# Patient Record
Sex: Male | Born: 1960 | Race: White | Hispanic: No | Marital: Married | Smoking: Former smoker
Health system: Southern US, Community
[De-identification: ages and names within clinical notes are randomized; demographics above are authoritative.]

## PROBLEM LIST (undated history)

## (undated) DIAGNOSIS — Z720 Tobacco use: Secondary | ICD-10-CM

## (undated) DIAGNOSIS — I509 Heart failure, unspecified: Secondary | ICD-10-CM

## (undated) DIAGNOSIS — F1290 Cannabis use, unspecified, uncomplicated: Secondary | ICD-10-CM

## (undated) DIAGNOSIS — I10 Essential (primary) hypertension: Secondary | ICD-10-CM

## (undated) DIAGNOSIS — F1090 Alcohol use, unspecified, uncomplicated: Secondary | ICD-10-CM

## (undated) DIAGNOSIS — I2510 Atherosclerotic heart disease of native coronary artery without angina pectoris: Secondary | ICD-10-CM

## (undated) DIAGNOSIS — K219 Gastro-esophageal reflux disease without esophagitis: Secondary | ICD-10-CM

## (undated) DIAGNOSIS — M1990 Unspecified osteoarthritis, unspecified site: Secondary | ICD-10-CM

## (undated) DIAGNOSIS — Z7289 Other problems related to lifestyle: Secondary | ICD-10-CM

## (undated) DIAGNOSIS — Z789 Other specified health status: Secondary | ICD-10-CM

## (undated) HISTORY — PX: FRACTURE SURGERY: SHX138

---

## 2020-01-29 ENCOUNTER — Other Ambulatory Visit: Payer: Self-pay

## 2020-10-20 ENCOUNTER — Inpatient Hospital Stay (HOSPITAL_COMMUNITY)
Admission: EM | Admit: 2020-10-20 | Discharge: 2020-11-04 | DRG: 870 | Discharge status: Against Medical Advice | Payer: Self-pay | Attending: Internal Medicine | Admitting: Internal Medicine

## 2020-10-20 ENCOUNTER — Other Ambulatory Visit: Payer: Self-pay

## 2020-10-20 DIAGNOSIS — I2510 Atherosclerotic heart disease of native coronary artery without angina pectoris: Secondary | ICD-10-CM | POA: Diagnosis present

## 2020-10-20 DIAGNOSIS — F10139 Alcohol abuse with withdrawal, unspecified: Secondary | ICD-10-CM | POA: Diagnosis present

## 2020-10-20 DIAGNOSIS — F419 Anxiety disorder, unspecified: Secondary | ICD-10-CM | POA: Diagnosis present

## 2020-10-20 DIAGNOSIS — F1010 Alcohol abuse, uncomplicated: Secondary | ICD-10-CM

## 2020-10-20 DIAGNOSIS — Z789 Other specified health status: Secondary | ICD-10-CM

## 2020-10-20 DIAGNOSIS — F1910 Other psychoactive substance abuse, uncomplicated: Secondary | ICD-10-CM

## 2020-10-20 DIAGNOSIS — F1310 Sedative, hypnotic or anxiolytic abuse, uncomplicated: Secondary | ICD-10-CM | POA: Diagnosis present

## 2020-10-20 DIAGNOSIS — I509 Heart failure, unspecified: Secondary | ICD-10-CM

## 2020-10-20 DIAGNOSIS — L89316 Pressure-induced deep tissue damage of right buttock: Secondary | ICD-10-CM | POA: Diagnosis present

## 2020-10-20 DIAGNOSIS — Z20822 Contact with and (suspected) exposure to covid-19: Secondary | ICD-10-CM | POA: Diagnosis present

## 2020-10-20 DIAGNOSIS — B9562 Methicillin resistant Staphylococcus aureus infection as the cause of diseases classified elsewhere: Secondary | ICD-10-CM

## 2020-10-20 DIAGNOSIS — A4102 Sepsis due to Methicillin resistant Staphylococcus aureus: Principal | ICD-10-CM | POA: Diagnosis present

## 2020-10-20 DIAGNOSIS — R601 Generalized edema: Secondary | ICD-10-CM

## 2020-10-20 DIAGNOSIS — Z95828 Presence of other vascular implants and grafts: Secondary | ICD-10-CM

## 2020-10-20 DIAGNOSIS — F17210 Nicotine dependence, cigarettes, uncomplicated: Secondary | ICD-10-CM | POA: Diagnosis present

## 2020-10-20 DIAGNOSIS — N179 Acute kidney failure, unspecified: Secondary | ICD-10-CM

## 2020-10-20 DIAGNOSIS — J9690 Respiratory failure, unspecified, unspecified whether with hypoxia or hypercapnia: Secondary | ICD-10-CM

## 2020-10-20 DIAGNOSIS — F17290 Nicotine dependence, other tobacco product, uncomplicated: Secondary | ICD-10-CM | POA: Diagnosis present

## 2020-10-20 DIAGNOSIS — F1210 Cannabis abuse, uncomplicated: Secondary | ICD-10-CM | POA: Diagnosis present

## 2020-10-20 DIAGNOSIS — R29705 NIHSS score 5: Secondary | ICD-10-CM | POA: Diagnosis not present

## 2020-10-20 DIAGNOSIS — D75838 Other thrombocytosis: Secondary | ICD-10-CM | POA: Diagnosis present

## 2020-10-20 DIAGNOSIS — R188 Other ascites: Secondary | ICD-10-CM | POA: Diagnosis present

## 2020-10-20 DIAGNOSIS — R7881 Bacteremia: Secondary | ICD-10-CM

## 2020-10-20 DIAGNOSIS — I255 Ischemic cardiomyopathy: Secondary | ICD-10-CM | POA: Diagnosis present

## 2020-10-20 DIAGNOSIS — E876 Hypokalemia: Secondary | ICD-10-CM | POA: Diagnosis present

## 2020-10-20 DIAGNOSIS — J9600 Acute respiratory failure, unspecified whether with hypoxia or hypercapnia: Secondary | ICD-10-CM

## 2020-10-20 DIAGNOSIS — W19XXXA Unspecified fall, initial encounter: Secondary | ICD-10-CM

## 2020-10-20 DIAGNOSIS — F10239 Alcohol dependence with withdrawal, unspecified: Secondary | ICD-10-CM | POA: Diagnosis present

## 2020-10-20 DIAGNOSIS — I493 Ventricular premature depolarization: Secondary | ICD-10-CM | POA: Diagnosis present

## 2020-10-20 DIAGNOSIS — L89326 Pressure-induced deep tissue damage of left buttock: Secondary | ICD-10-CM | POA: Diagnosis present

## 2020-10-20 DIAGNOSIS — I5023 Acute on chronic systolic (congestive) heart failure: Secondary | ICD-10-CM | POA: Diagnosis present

## 2020-10-20 DIAGNOSIS — J810 Acute pulmonary edema: Secondary | ICD-10-CM

## 2020-10-20 DIAGNOSIS — K7460 Unspecified cirrhosis of liver: Secondary | ICD-10-CM | POA: Diagnosis present

## 2020-10-20 DIAGNOSIS — Z452 Encounter for adjustment and management of vascular access device: Secondary | ICD-10-CM

## 2020-10-20 DIAGNOSIS — L89322 Pressure ulcer of left buttock, stage 2: Secondary | ICD-10-CM | POA: Diagnosis present

## 2020-10-20 DIAGNOSIS — L89159 Pressure ulcer of sacral region, unspecified stage: Secondary | ICD-10-CM | POA: Diagnosis present

## 2020-10-20 DIAGNOSIS — E875 Hyperkalemia: Secondary | ICD-10-CM

## 2020-10-20 DIAGNOSIS — N170 Acute kidney failure with tubular necrosis: Secondary | ICD-10-CM | POA: Diagnosis present

## 2020-10-20 DIAGNOSIS — Z8249 Family history of ischemic heart disease and other diseases of the circulatory system: Secondary | ICD-10-CM

## 2020-10-20 DIAGNOSIS — E46 Unspecified protein-calorie malnutrition: Secondary | ICD-10-CM

## 2020-10-20 DIAGNOSIS — R7401 Elevation of levels of liver transaminase levels: Secondary | ICD-10-CM

## 2020-10-20 DIAGNOSIS — J9601 Acute respiratory failure with hypoxia: Secondary | ICD-10-CM | POA: Diagnosis present

## 2020-10-20 DIAGNOSIS — E872 Acidosis: Secondary | ICD-10-CM | POA: Diagnosis present

## 2020-10-20 DIAGNOSIS — I110 Hypertensive heart disease with heart failure: Secondary | ICD-10-CM | POA: Diagnosis present

## 2020-10-20 DIAGNOSIS — R4701 Aphasia: Secondary | ICD-10-CM | POA: Diagnosis not present

## 2020-10-20 DIAGNOSIS — L8990 Pressure ulcer of unspecified site, unspecified stage: Secondary | ICD-10-CM | POA: Insufficient documentation

## 2020-10-20 DIAGNOSIS — E441 Mild protein-calorie malnutrition: Secondary | ICD-10-CM | POA: Diagnosis present

## 2020-10-20 DIAGNOSIS — R0602 Shortness of breath: Secondary | ICD-10-CM

## 2020-10-20 DIAGNOSIS — J15212 Pneumonia due to Methicillin resistant Staphylococcus aureus: Secondary | ICD-10-CM | POA: Diagnosis present

## 2020-10-20 DIAGNOSIS — R570 Cardiogenic shock: Secondary | ICD-10-CM

## 2020-10-20 DIAGNOSIS — Z720 Tobacco use: Secondary | ICD-10-CM

## 2020-10-20 DIAGNOSIS — Z978 Presence of other specified devices: Secondary | ICD-10-CM

## 2020-10-20 DIAGNOSIS — Z9289 Personal history of other medical treatment: Secondary | ICD-10-CM

## 2020-10-20 DIAGNOSIS — E8809 Other disorders of plasma-protein metabolism, not elsewhere classified: Secondary | ICD-10-CM

## 2020-10-20 DIAGNOSIS — Z4659 Encounter for fitting and adjustment of other gastrointestinal appliance and device: Secondary | ICD-10-CM

## 2020-10-20 DIAGNOSIS — R339 Retention of urine, unspecified: Secondary | ICD-10-CM | POA: Diagnosis not present

## 2020-10-20 DIAGNOSIS — Z79899 Other long term (current) drug therapy: Secondary | ICD-10-CM

## 2020-10-20 DIAGNOSIS — D6489 Other specified anemias: Secondary | ICD-10-CM | POA: Diagnosis present

## 2020-10-20 DIAGNOSIS — F1410 Cocaine abuse, uncomplicated: Secondary | ICD-10-CM

## 2020-10-20 DIAGNOSIS — R7989 Other specified abnormal findings of blood chemistry: Secondary | ICD-10-CM

## 2020-10-20 DIAGNOSIS — G9341 Metabolic encephalopathy: Secondary | ICD-10-CM | POA: Diagnosis present

## 2020-10-20 DIAGNOSIS — R6521 Severe sepsis with septic shock: Secondary | ICD-10-CM | POA: Diagnosis present

## 2020-10-20 DIAGNOSIS — D75839 Thrombocytosis, unspecified: Secondary | ICD-10-CM

## 2020-10-20 DIAGNOSIS — G9340 Encephalopathy, unspecified: Secondary | ICD-10-CM

## 2020-10-20 DIAGNOSIS — I472 Ventricular tachycardia: Secondary | ICD-10-CM | POA: Diagnosis present

## 2020-10-20 HISTORY — DX: Alcohol use, unspecified, uncomplicated: F10.90

## 2020-10-20 HISTORY — DX: Cannabis use, unspecified, uncomplicated: F12.90

## 2020-10-20 HISTORY — DX: Tobacco use: Z72.0

## 2020-10-20 HISTORY — DX: Other specified health status: Z78.9

## 2020-10-20 HISTORY — DX: Unspecified osteoarthritis, unspecified site: M19.90

## 2020-10-20 HISTORY — DX: Other problems related to lifestyle: Z72.89

## 2020-10-20 NOTE — ED Triage Notes (Addendum)
Pt c/o bilateral knee swelling that started since he has been more active playing football. Pt also states he feel like his abd is swollen some as well. Pt also states he is concerned that he might have a UTI and sinus infection as well.

## 2020-10-21 ENCOUNTER — Other Ambulatory Visit: Payer: Self-pay

## 2020-10-21 ENCOUNTER — Inpatient Hospital Stay (HOSPITAL_COMMUNITY): Payer: Self-pay

## 2020-10-21 ENCOUNTER — Encounter (HOSPITAL_COMMUNITY): Payer: Self-pay | Admitting: Emergency Medicine

## 2020-10-21 ENCOUNTER — Emergency Department (HOSPITAL_COMMUNITY): Payer: Self-pay

## 2020-10-21 DIAGNOSIS — I340 Nonrheumatic mitral (valve) insufficiency: Secondary | ICD-10-CM

## 2020-10-21 DIAGNOSIS — E46 Unspecified protein-calorie malnutrition: Secondary | ICD-10-CM

## 2020-10-21 DIAGNOSIS — I509 Heart failure, unspecified: Secondary | ICD-10-CM

## 2020-10-21 DIAGNOSIS — D75839 Thrombocytosis, unspecified: Secondary | ICD-10-CM

## 2020-10-21 DIAGNOSIS — E8809 Other disorders of plasma-protein metabolism, not elsewhere classified: Secondary | ICD-10-CM

## 2020-10-21 DIAGNOSIS — R7401 Elevation of levels of liver transaminase levels: Secondary | ICD-10-CM

## 2020-10-21 DIAGNOSIS — R7989 Other specified abnormal findings of blood chemistry: Secondary | ICD-10-CM

## 2020-10-21 DIAGNOSIS — Z720 Tobacco use: Secondary | ICD-10-CM

## 2020-10-21 LAB — CBC
HCT: 42.6 % (ref 39.0–52.0)
Hemoglobin: 12.7 g/dL — ABNORMAL LOW (ref 13.0–17.0)
MCH: 27.9 pg (ref 26.0–34.0)
MCHC: 29.8 g/dL — ABNORMAL LOW (ref 30.0–36.0)
MCV: 93.6 fL (ref 80.0–100.0)
Platelets: 435 10*3/uL — ABNORMAL HIGH (ref 150–400)
RBC: 4.55 MIL/uL (ref 4.22–5.81)
RDW: 16.9 % — ABNORMAL HIGH (ref 11.5–15.5)
WBC: 6.4 10*3/uL (ref 4.0–10.5)
nRBC: 0 % (ref 0.0–0.2)

## 2020-10-21 LAB — CBC WITH DIFFERENTIAL/PLATELET
Abs Immature Granulocytes: 0.01 10*3/uL (ref 0.00–0.07)
Basophils Absolute: 0 10*3/uL (ref 0.0–0.1)
Basophils Relative: 0 %
Eosinophils Absolute: 0.1 10*3/uL (ref 0.0–0.5)
Eosinophils Relative: 2 %
HCT: 42 % (ref 39.0–52.0)
Hemoglobin: 12.4 g/dL — ABNORMAL LOW (ref 13.0–17.0)
Immature Granulocytes: 0 %
Lymphocytes Relative: 17 %
Lymphs Abs: 1 10*3/uL (ref 0.7–4.0)
MCH: 27.5 pg (ref 26.0–34.0)
MCHC: 29.5 g/dL — ABNORMAL LOW (ref 30.0–36.0)
MCV: 93.1 fL (ref 80.0–100.0)
Monocytes Absolute: 0.5 10*3/uL (ref 0.1–1.0)
Monocytes Relative: 9 %
Neutro Abs: 4 10*3/uL (ref 1.7–7.7)
Neutrophils Relative %: 72 %
Platelets: 431 10*3/uL — ABNORMAL HIGH (ref 150–400)
RBC: 4.51 MIL/uL (ref 4.22–5.81)
RDW: 16.9 % — ABNORMAL HIGH (ref 11.5–15.5)
WBC: 5.6 10*3/uL (ref 4.0–10.5)
nRBC: 0 % (ref 0.0–0.2)

## 2020-10-21 LAB — COMPREHENSIVE METABOLIC PANEL
ALT: 173 U/L — ABNORMAL HIGH (ref 0–44)
ALT: 167 U/L — ABNORMAL HIGH (ref 0–44)
AST: 91 U/L — ABNORMAL HIGH (ref 15–41)
AST: 86 U/L — ABNORMAL HIGH (ref 15–41)
Albumin: 3.3 g/dL — ABNORMAL LOW (ref 3.5–5.0)
Albumin: 3.4 g/dL — ABNORMAL LOW (ref 3.5–5.0)
Alkaline Phosphatase: 81 U/L (ref 38–126)
Alkaline Phosphatase: 83 U/L (ref 38–126)
Anion gap: 8 (ref 5–15)
Anion gap: 8 (ref 5–15)
BUN: 20 mg/dL (ref 6–20)
BUN: 20 mg/dL (ref 6–20)
CO2: 26 mmol/L (ref 22–32)
CO2: 27 mmol/L (ref 22–32)
Calcium: 8.2 mg/dL — ABNORMAL LOW (ref 8.9–10.3)
Calcium: 8.5 mg/dL — ABNORMAL LOW (ref 8.9–10.3)
Chloride: 103 mmol/L (ref 98–111)
Chloride: 103 mmol/L (ref 98–111)
Creatinine, Ser: 1.09 mg/dL (ref 0.61–1.24)
Creatinine, Ser: 1.05 mg/dL (ref 0.61–1.24)
GFR, Estimated: >60 mL/min (ref >60)
GFR, Estimated: >60 mL/min (ref >60)
Glucose, Bld: 126 mg/dL — ABNORMAL HIGH (ref 70–99)
Glucose, Bld: 104 mg/dL — ABNORMAL HIGH (ref 70–99)
Potassium: 3.8 mmol/L (ref 3.5–5.1)
Potassium: 4.3 mmol/L (ref 3.5–5.1)
Sodium: 137 mmol/L (ref 135–145)
Sodium: 138 mmol/L (ref 135–145)
Total Bilirubin: 1 mg/dL (ref 0.3–1.2)
Total Bilirubin: 1 mg/dL (ref 0.3–1.2)
Total Protein: 6.4 g/dL — ABNORMAL LOW (ref 6.5–8.1)
Total Protein: 6.4 g/dL — ABNORMAL LOW (ref 6.5–8.1)

## 2020-10-21 LAB — APTT: aPTT: 30 seconds (ref 24–36)

## 2020-10-21 LAB — PROTIME-INR
INR: 1.3 — ABNORMAL HIGH (ref 0.8–1.2)
Prothrombin Time: 16 seconds — ABNORMAL HIGH (ref 11.4–15.2)

## 2020-10-21 LAB — ECHOCARDIOGRAM COMPLETE
AR max vel: 2.44 cm2
AV Area VTI: 2.79 cm2
AV Area mean vel: 2.22 cm2
AV Mean grad: 1.4 mmHg
AV Peak grad: 2.8 mmHg
Ao pk vel: 0.83 m/s
Area-P 1/2: 3.15 cm2
Height: 69 in
S' Lateral: 5.57 cm
Weight: 3200 oz

## 2020-10-21 LAB — HIV ANTIBODY (ROUTINE TESTING W REFLEX): HIV Screen 4th Generation wRfx: NONREACTIVE

## 2020-10-21 LAB — HEPATITIS PANEL, ACUTE
HCV Ab: NONREACTIVE
Hep A IgM: NONREACTIVE
Hep B C IgM: NONREACTIVE
Hepatitis B Surface Ag: NONREACTIVE

## 2020-10-21 LAB — PHOSPHORUS: Phosphorus: 4.1 mg/dL (ref 2.5–4.6)

## 2020-10-21 LAB — AMMONIA: Ammonia: 12 umol/L (ref 9–35)

## 2020-10-21 LAB — SARS CORONAVIRUS 2 (TAT 6-24 HRS): SARS Coronavirus 2: NEGATIVE

## 2020-10-21 LAB — BRAIN NATRIURETIC PEPTIDE: B Natriuretic Peptide: 2252 pg/mL — ABNORMAL HIGH (ref 0.0–100.0)

## 2020-10-21 LAB — MAGNESIUM: Magnesium: 2.1 mg/dL (ref 1.7–2.4)

## 2020-10-21 MED ORDER — LORAZEPAM 1 MG PO TABS
1.0000 mg | ORAL_TABLET | ORAL | Status: AC | PRN
  Administered 2020-10-22 – 2020-10-24 (×6): 1 mg via ORAL
  Administered 2020-10-24 (×2): 2 mg via ORAL
  Filled 2020-10-21 (×5): qty 1
  Filled 2020-10-21: qty 2
  Filled 2020-10-21: qty 1
  Filled 2020-10-21: qty 2

## 2020-10-21 MED ORDER — LORAZEPAM 0.5 MG PO TABS
0.5000 mg | ORAL_TABLET | Freq: Once | ORAL | Status: AC
  Administered 2020-10-21: 0.5 mg via ORAL
  Filled 2020-10-21: qty 1

## 2020-10-21 MED ORDER — FOLIC ACID 1 MG PO TABS
1.0000 mg | ORAL_TABLET | Freq: Every day | ORAL | Status: DC
  Administered 2020-10-21 – 2020-10-24 (×4): 1 mg via ORAL
  Filled 2020-10-21 (×4): qty 1

## 2020-10-21 MED ORDER — THIAMINE HCL 100 MG PO TABS
100.0000 mg | ORAL_TABLET | Freq: Every day | ORAL | Status: DC
  Administered 2020-10-21 – 2020-10-24 (×4): 100 mg via ORAL
  Filled 2020-10-21 (×4): qty 1

## 2020-10-21 MED ORDER — FUROSEMIDE 10 MG/ML IJ SOLN
40.0000 mg | Freq: Two times a day (BID) | INTRAMUSCULAR | Status: DC
  Administered 2020-10-21 – 2020-10-23 (×5): 40 mg via INTRAVENOUS
  Filled 2020-10-21 (×5): qty 4

## 2020-10-21 MED ORDER — LORAZEPAM 2 MG/ML IJ SOLN
1.0000 mg | INTRAMUSCULAR | Status: AC | PRN
Start: 2020-10-21 — End: 2020-10-24
  Administered 2020-10-21 – 2020-10-22 (×2): 1 mg via INTRAVENOUS
  Administered 2020-10-24: 2 mg via INTRAVENOUS
  Filled 2020-10-21 (×3): qty 1

## 2020-10-21 MED ORDER — ADULT MULTIVITAMIN W/MINERALS CH
1.0000 | ORAL_TABLET | Freq: Every day | ORAL | Status: DC
  Administered 2020-10-21 – 2020-10-24 (×4): 1 via ORAL
  Filled 2020-10-21 (×4): qty 1

## 2020-10-21 MED ORDER — LORAZEPAM 2 MG/ML PO CONC: 0.5000 mg | Freq: Once | ORAL | Status: DC

## 2020-10-21 MED ORDER — ENSURE ENLIVE PO LIQD
237.0000 mL | Freq: Two times a day (BID) | ORAL | Status: DC
  Administered 2020-10-22 – 2020-10-24 (×6): 237 mL via ORAL
  Filled 2020-10-21 (×4): qty 237

## 2020-10-21 MED ORDER — FUROSEMIDE 10 MG/ML IJ SOLN
40.0000 mg | Freq: Once | INTRAMUSCULAR | Status: AC
  Administered 2020-10-21: 40 mg via INTRAVENOUS
  Filled 2020-10-21: qty 4

## 2020-10-21 MED ORDER — THIAMINE HCL 100 MG/ML IJ SOLN
100.0000 mg | Freq: Every day | INTRAMUSCULAR | Status: DC
  Administered 2020-10-25: 100 mg via INTRAVENOUS
  Filled 2020-10-21 (×3): qty 2

## 2020-10-21 MED ORDER — DIPHENHYDRAMINE HCL 25 MG PO CAPS
25.0000 mg | ORAL_CAPSULE | Freq: Four times a day (QID) | ORAL | Status: DC | PRN
  Administered 2020-10-21 – 2020-10-24 (×2): 25 mg via ORAL
  Filled 2020-10-21 (×2): qty 1

## 2020-10-21 MED ORDER — HYDROXYZINE HCL 25 MG PO TABS
50.0000 mg | ORAL_TABLET | Freq: Once | ORAL | Status: AC
  Administered 2020-10-21: 50 mg via ORAL
  Filled 2020-10-21: qty 2

## 2020-10-21 MED ORDER — ENOXAPARIN SODIUM 40 MG/0.4ML IJ SOSY
40.0000 mg | PREFILLED_SYRINGE | INTRAMUSCULAR | Status: DC
  Administered 2020-10-21 – 2020-11-03 (×13): 40 mg via SUBCUTANEOUS
  Filled 2020-10-21 (×13): qty 0.4

## 2020-10-21 MED ORDER — FUROSEMIDE 10 MG/ML IJ SOLN: 40.0000 mg | Freq: Two times a day (BID) | INTRAMUSCULAR | Status: DC

## 2020-10-21 NOTE — ED Notes (Signed)
Patient transported to X-ray 

## 2020-10-21 NOTE — Progress Notes (Signed)
Kyle Peck is a 60 y.o. male with medical history significant for tobacco use and occasional alcohol use who presents to the emergency department due to several days of leg and abdominal swelling.  He was thought to potentially have new-onset CHF and has been started on IV Lasix twice daily for diuresis.  2D echocardiogram ordered and pending.  Patient does not see primary care doctors in outpatient setting.  Volume overload with elevated BNP -Suspicion of new onset CHF -2D echocardiogram ordered and pending -Continue IV Lasix 40 mg twice daily -Monitor daily weights and strict I's and O's -Consider cardiology evaluation as needed  Transaminitis-downtrending -Recheck labs in a.m. -Right upper quadrant ultrasound with findings of ascites and possible cirrhosis -Hepatitis panel nonreactive -May require paracentesis to assist with diuresis  Hypoalbuminemia -Likely related to mild protein calorie malnutrition  History of alcohol use -CIWA protocol -Counseled on cessation of alcohol  History of tobacco abuse -Counseled on cessation  Total care time: 30 minutes.

## 2020-10-21 NOTE — TOC Initial Note (Signed)
Transition of Care [facility]) - Initial/Assessment Note    Patient Details  Name: Kyle Peck MRN: [id number] Date of Birth: February 20, 1961  [facility] CM/SW Contact:    Annice Needy, LCSW Phone Number: 10/21/2020, 4:39 PM  Clinical Narrative:                 Per wife, she and patient are being evicted on 5/31 and their water was turned off today. The have been in the home for 33 years. Her disability is pending and patient has a Radio broadcast assistant.  She is aware of subsidized housing as her daughter lives there, however patient does not want to live in an apartment.  Ms. Buscemi states that they can stay with her mother once they leave the home. She advised that she is fine staying at her home right now as she can shower at her mother's or daughter's home.  Discussed homeless shelters. Ms. Hofman states that they could stay with mother.  Discussed DSS for assistance with water bill. She indicated that [facility] will not accept assistance from DSS for water as she has tried.  Discussed a MATCH voucher for patient should he be discharged on medications. Advised that it would provide a 30 day supply.     Expected Discharge Plan: Home/Self Care Barriers to Discharge: Continued Medical Work up   Patient Goals and CMS Choice Patient states their goals for this hospitalization and ongoing recovery are:: return home   Choice offered to / list presented to : Spouse  Expected Discharge Plan and Services Expected Discharge Plan: Home/Self Care       Living arrangements for the past 2 months: Single Family Home                                      Prior Living Arrangements/Services Living arrangements for the past 2 months: Single Family Home Lives with:: Spouse Patient language and need for interpreter reviewed:: Yes Do you feel safe going back to the place where you live?: Yes      Need for Family Participation in Patient Care: Yes (Comment) Care giver support  system in place?: Yes (comment)   Criminal Activity/Legal Involvement Pertinent to Current Situation/Hospitalization: No - Comment as needed  Activities of Daily Living Home Assistive Devices/Equipment: None ADL Screening (condition at time of admission) Patient's cognitive ability adequate to safely complete daily activities?: Yes Is the patient deaf or have difficulty hearing?: No Does the patient have difficulty seeing, even when wearing glasses/contacts?: No Does the patient have difficulty concentrating, remembering, or making decisions?: No Patient able to express need for assistance with ADLs?: Yes Does the patient have difficulty dressing or bathing?: No Independently performs ADLs?: Yes (appropriate for developmental age) Does the patient have difficulty walking or climbing stairs?: No Weakness of Legs: None Weakness of Arms/Hands: None  Permission Sought/Granted Permission sought to share information with : Family Supports    Share Information with NAME: Pistol Kessenich     Permission granted to share info w Relationship: spouse     Emotional Assessment     Affect (typically observed): Appropriate Orientation: : Oriented to Self,Oriented to Place,Oriented to  Time,Oriented to Situation Alcohol / Substance Use: Not Applicable Psych Involvement: No (comment)  Admission diagnosis:  Anasarca [R60.1] CHF (congestive heart failure) (HCC) [I50.9] Acute pulmonary edema (HCC) [J81.0] Transaminitis [R74.01] New onset of congestive heart failure (HCC) [I50.9] Patient Active  Problem List   Diagnosis Date Noted  . CHF (congestive heart failure) (HCC) 10/21/2020  . Elevated brain natriuretic peptide (BNP) level 10/21/2020  . Transaminitis 10/21/2020  . Thrombocytosis 10/21/2020  . Hypoalbuminemia due to protein-calorie malnutrition (HCC) 10/21/2020  . Tobacco use 10/21/2020   PCP:  Pcp, No Pharmacy:   [facility] - [place], [place] - [address] [place] [address] Phone: [phone] Fax: [phone]     Social Determinants of Health (SDOH) Interventions    Readmission Risk Interventions No flowsheet data found.

## 2020-10-21 NOTE — Plan of Care (Signed)

## 2020-10-21 NOTE — ED Notes (Signed)
128 on bladder scan

## 2020-10-21 NOTE — Progress Notes (Signed)
*  PRELIMINARY RESULTS* Echocardiogram 2D Echocardiogram has been performed.  Kyle Peck 10/21/2020, 11:50 AM

## 2020-10-21 NOTE — H&P (Signed)
History and Physical  Mckade Shishido XTG:[id number] DOB: 12/06/1960 DOA: 10/20/2020  Referring physician: Shon Baton, MD PCP: Pcp, No  Patient coming from: Home  Chief Complaint: Leg and abdominal swelling  HPI: Lliam Yepiz is a 60 y.o. male with medical history significant for tobacco use and occasional alcohol use who presents to the emergency department due to several days of leg and abdominal swelling.  Patient complained of increased leg swelling, testicular swelling and abdominal swelling which started a few days ago, he complained of occasional shortness of breath when he lays flat at night, but denies chest pain, fever, chills, abdominal pain.  Patient states that he has not been to any doctors office in more than  20 years.  ED Course:  In the emergency department, he was tachycardic, but other vital signs were within normal range.  Work-up in the ED showed normocytic anemia, mild thrombocytosis, hypoalbuminemia, elevated enzymes. Chest x-ray showed cardiomegaly with mild prominence of the central vasculature. No visible pleural effusion or overt pulmonary edema. He was treated with IV Lasix 40 mg x 1, Atarax was given.  Hospitalist was asked to admit patient for further evaluation.  Review of Systems: Constitutional: Negative for chills and fever.  HENT: Negative for ear pain and sore throat.   Eyes: Negative for pain and visual disturbance.  Respiratory: Positive for shortness of breath.  Negative for cough and chest tightness Cardiovascular: Negative for chest pain and palpitations.  Gastrointestinal: Negative for abdominal pain and vomiting.  Endocrine: Negative for polyphagia and polyuria.  Genitourinary: Positive for testicular swelling.  Negative for decreased urine volume, dysuria Musculoskeletal: Negative for arthralgias and back pain.  Skin: Negative for color change and rash.  Allergic/Immunologic: Negative for immunocompromised state.  Neurological: Negative  for tremors, syncope, speech difficulty, weakness, light-headedness and headaches.  Hematological: Does not bruise/bleed easily.  All other systems reviewed and are negative    Past Medical History:  Diagnosis Date  . Arthritis    Past Surgical History:  Procedure Laterality Date  . FRACTURE SURGERY     cheek     Social History:  reports that he has been smoking. He has never used smokeless tobacco. No history on file for alcohol use and drug use.   No Known Allergies  No family history on file.    Prior to Admission medications   Not on File    Physical Exam: BP (!) 119/94   Pulse (!) 115   Temp 98.7 F (37.1 C) (Oral)   Resp 17   Ht 5\' 9"  (1.753 m)   Wt 90.7 kg   SpO2 99%   BMI 29.53 kg/m   . General: 60 y.o. year-old male well developed well nourished in no acute distress.  Alert and oriented x3. 67 HEENT: NCAT, EOMI . Neck: Supple, trachea medial . Cardiovascular: Positive JVD.  Regular rate and rhythm with no rubs or gallops.  B/L lower extremity edema. 2/4 pulses in all 4 extremities. Marland Kitchen Respiratory: Clear to auscultation with no wheezes or rales. Good inspiratory effort. . Abdomen: Soft, mild abdominal distention, nontender, normal bowel sounds x4 quadrants. . Muskuloskeletal:+2 lower extremity bilaterally to the thighs.  No cyanosis or clubbing . Neuro: CN II-XII intact, strength 5/5 x 4, sensation, reflexes intact . Skin: No ulcerative lesions noted or rashes . Psychiatry: Mood is appropriate for condition and setting          Labs on Admission:  Basic Metabolic Panel: Recent Labs  Lab 10/21/20 0033  NA 137  K 3.8  CL 103  CO2 26  GLUCOSE 126*  BUN 20  CREATININE 1.09  CALCIUM 8.2*   Liver Function Tests: Recent Labs  Lab 10/21/20 0033  AST 91*  ALT 173*  ALKPHOS 81  BILITOT 1.0  PROT 6.4*  ALBUMIN 3.3*   No results for input(s): LIPASE, AMYLASE in the last 168 hours. No results for input(s): AMMONIA in the last 168  hours. CBC: Recent Labs  Lab 10/21/20 0033  WBC 5.6  NEUTROABS 4.0  HGB 12.4*  HCT 42.0  MCV 93.1  PLT 431*   Cardiac Enzymes: No results for input(s): CKTOTAL, CKMB, CKMBINDEX, TROPONINI in the last 168 hours.  BNP (last 3 results) Recent Labs    10/21/20 0033  BNP 2,252.0*    ProBNP (last 3 results) No results for input(s): PROBNP in the last 8760 hours.  CBG: No results for input(s): GLUCAP in the last 168 hours.  Radiological Exams on Admission: DG Chest 2 View  Result Date: 10/21/2020 CLINICAL DATA:  anasarca; fluid retention EXAM: CHEST - 2 VIEW COMPARISON:  None. FINDINGS: Cardiomegaly with mild prominence of the central vasculature. Aortic atherosclerosis. No focal consolidation or overt pulmonary edema. No pleural effusion or pneumothorax the visualized skeletal structures are unremarkable. IMPRESSION: Cardiomegaly with mild prominence of the central vasculature. No visible pleural effusion or overt pulmonary edema. Aortic Atherosclerosis (ICD10-I70.0). Electronically Signed   By: Maudry Mayhew MD   On: 10/21/2020 00:57    EKG: I independently viewed the EKG done and my findings are as followed: Sinus tachycardia at rate of 106 bpm with increased PR interval  Assessment/Plan Present on Admission: **None**  Principal Problem:   Elevated brain natriuretic peptide (BNP) level Active Problems:   CHF (congestive heart failure) (HCC)   Transaminitis   Thrombocytosis   Hypoalbuminemia due to protein-calorie malnutrition (HCC)   Tobacco use   Elevated BNP rule out new onset CHF BNP 2252.  Patient presents with bilateral lower extremity edema, abdominal distention, testicular enlargement and increased JVD on physical exam. Chest x-ray showed cardiomegaly with mild prominence of the central vasculature.  Continue total input/output, daily weights and fluid restriction Continue IV Lasix 40 twice daily as tolerated by BP Continue Cardiac diet  Echocardiogram in  the morning   Transaminitis AST 91, ALT 173, patient denies any abdominal pain Continue to monitor and consider RUQ U/S if this continues to increase.  Hepatitis panel will be checked  Hypoalbuminemia possibly secondary to mild protein calorie depression Alb 3.3, protein supplement will be provided  Thrombocytosis possibly reactive Platelets 431, continue to monitor platelets levels  Tobacco use Patient was counseled on tobacco use cessation  DVT prophylaxis: Lovenox  Code Status: Full code  Family Communication: None at bedside  Disposition Plan:  Patient is from:                        home Anticipated DC to:                   SNF or family members home Anticipated DC date:               2-3 days Anticipated DC barriers:         patient requires inpatient management for possible new onset CHF  Consults called: None  Admission status: Inpatient    Frankey Shown MD [facility] 10/21/2020, 3:27 AM

## 2020-10-21 NOTE — ED Provider Notes (Signed)
[facility] EMERGENCY DEPARTMENT Provider Note   CSN: [id number] Arrival date & time: 10/20/20  2334     History Chief Complaint  Patient presents with  . Joint Swelling    Selden Faulkenberry is a 60 y.o. male.  HPI     This is a 60 year old male with a history of arthritis who presents with leg and abdominal swelling.  Patient reports that he thinks he needs knee replacements.  Over the last several days he has noted progressive bilateral leg and knee swelling and "this time it seems to be coming into my abdomen."  He is not on any diuretics and no history of heart disease.  No history of cirrhosis.  He denies any significant shortness of breath but states occasionally when he lays flat at night he feels short of breath.  No chest pain.  No recent fevers or cough.  He does report that he drinks alcohol but not daily.  Past Medical History:  Diagnosis Date  . Arthritis     There are no problems to display for this patient.   Past Surgical History:  Procedure Laterality Date  . FRACTURE SURGERY     cheek        No family history on file.  Social History   Tobacco Use  . Smoking status: Current Some Day Smoker  . Smokeless tobacco: Never Used    Home Medications Prior to Admission medications   Not on File    Allergies    Patient has no known allergies.  Review of Systems   Review of Systems  Constitutional: Negative for fever.  Respiratory: Positive for shortness of breath.   Cardiovascular: Positive for leg swelling. Negative for chest pain.  Gastrointestinal: Negative for abdominal pain, diarrhea, nausea and vomiting.  Genitourinary: Negative for dysuria.  All other systems reviewed and are negative.   Physical Exam Updated Vital Signs BP 112/81   Pulse (!) 109   Temp 98.7 F (37.1 C) (Oral)   Resp 20   Ht 1.753 m (5\' 9" )   Wt 90.7 kg   SpO2 100%   BMI 29.53 kg/m   Physical Exam Vitals and nursing note reviewed.  Constitutional:       Appearance: He is well-developed. He is obese. He is not ill-appearing.  HENT:     Head: Normocephalic and atraumatic.     Nose: Nose normal.     Mouth/Throat:     Mouth: Mucous membranes are moist.  Eyes:     General: No scleral icterus.    Pupils: Pupils are equal, round, and reactive to light.  Cardiovascular:     Rate and Rhythm: Normal rate and regular rhythm.     Heart sounds: Normal heart sounds. No murmur heard.   Pulmonary:     Effort: Pulmonary effort is normal. No respiratory distress.     Breath sounds: Normal breath sounds. No wheezing.     Comments: Fine crackles bilateral bases Abdominal:     General: Bowel sounds are normal.     Palpations: Abdomen is soft.     Tenderness: There is no abdominal tenderness. There is no rebound.     Comments: Anasarca of the lower abdomen, no significant tenderness  Musculoskeletal:     Cervical back: Neck supple.     Right lower leg: Edema present.     Left lower leg: Edema present.     Comments: 2+ pitting edema the bilateral lower extremities into the thighs  Lymphadenopathy:  Cervical: No cervical adenopathy.  Skin:    General: Skin is warm and dry.  Neurological:     Mental Status: He is alert and oriented to person, place, and time.  Psychiatric:        Mood and Affect: Mood normal.     ED Results / Procedures / Treatments   Labs (all labs ordered are listed, but only abnormal results are displayed) Labs Reviewed  CBC WITH DIFFERENTIAL/PLATELET - Abnormal; Notable for the following components:      Result Value   Hemoglobin 12.4 (*)    MCHC 29.5 (*)    RDW 16.9 (*)    Platelets 431 (*)    All other components within normal limits  COMPREHENSIVE METABOLIC PANEL - Abnormal; Notable for the following components:   Glucose, Bld 126 (*)    Calcium 8.2 (*)    Total Protein 6.4 (*)    Albumin 3.3 (*)    AST 91 (*)    ALT 173 (*)    All other components within normal limits  BRAIN NATRIURETIC PEPTIDE -  Abnormal; Notable for the following components:   B Natriuretic Peptide 2,252.0 (*)    All other components within normal limits  SARS CORONAVIRUS 2 (TAT 6-24 HRS)  URINALYSIS, ROUTINE W REFLEX MICROSCOPIC  RAPID URINE DRUG SCREEN, HOSP PERFORMED    EKG EKG Interpretation  Date/Time:  Thursday Oct 21 2020 01:19:56 EDT Ventricular Rate:  106 PR Interval:  232 QRS Duration: 89 QT Interval:  319 QTC Calculation: 424 R Axis:   100 Text Interpretation: Sinus tachycardia Prolonged PR interval Probable left atrial enlargement Abnormal lateral Q waves Anterior infarct, old no prior for comparison Confirmed by Ross Marcus ([id number]) on 10/21/2020 1:23:08 AM   Radiology DG Chest 2 View  Result Date: 10/21/2020 CLINICAL DATA:  anasarca; fluid retention EXAM: CHEST - 2 VIEW COMPARISON:  None. FINDINGS: Cardiomegaly with mild prominence of the central vasculature. Aortic atherosclerosis. No focal consolidation or overt pulmonary edema. No pleural effusion or pneumothorax the visualized skeletal structures are unremarkable. IMPRESSION: Cardiomegaly with mild prominence of the central vasculature. No visible pleural effusion or overt pulmonary edema. Aortic Atherosclerosis (ICD10-I70.0). Electronically Signed   By: Maudry Mayhew MD   On: 10/21/2020 00:57    Procedures .Critical Care Performed by: Shon Baton, MD Authorized by: Shon Baton, MD   Critical care provider statement:    Critical care time (minutes):  40   Critical care was necessary to treat or prevent imminent or life-threatening deterioration of the following conditions:  Cardiac failure   Critical care was time spent personally by me on the following activities:  Discussions with consultants, evaluation of patient's response to treatment, examination of patient, ordering and performing treatments and interventions, ordering and review of laboratory studies, ordering and review of radiographic studies, pulse oximetry,  re-evaluation of patient's condition, obtaining history from patient or surrogate and review of old charts     Medications Ordered in ED Medications  furosemide (LASIX) injection 40 mg (has no administration in time range)  hydrOXYzine (ATARAX/VISTARIL) tablet 50 mg (has no administration in time range)    ED Course  I have reviewed the triage vital signs and the nursing notes.  Pertinent labs & imaging results that were available during my care of the patient were reviewed by me and considered in my medical decision making (see chart for details).    MDM Rules/Calculators/A&P  Patient initially presented with complaints of bilateral knee pain.  However, noted increasing swelling of the bilateral lower extremities, scrotum, abdomen.  On my evaluation he is nontoxic.  He is afebrile.  Vital signs notable for heart rate of 109.  He appears clinically significantly volume overloaded.  He is in no respiratory distress.  No known history of heart failure.  He reports some alcohol use but denies daily alcohol use.  Considerations include but not limited to, heart failure, protein malnutrition, cirrhosis, renal failure.  Work-up initiated.  EKG without acute ischemic changes.  He does have some what appear to be old changes but there is no prior to comparison.  BNP is acutely elevated greater than 2000 suggestive of likely heart failure.  He has slightly elevated LFTs but creatinine is preserved.  Chest x-ray shows cardiomegaly and some vascular prominence.  No overt pulmonary edema or pleural effusion.  Clinically I feel that this is highly suggestive of heart failure.  No echo in the system.  No prior cardiac work-up.  Given the significance of his fluid retention, feel he needs admission for diuresis and management.  Will need echocardiogram.  Patient was given IV Lasix.  He was additionally given Vistaril for sleep.  We will plan for admission to the hospitalist.  Final  Clinical Impression(s) / ED Diagnoses Final diagnoses:  Anasarca  Acute pulmonary edema (HCC)  New onset of congestive heart failure [facility])    Rx / DC Orders ED Discharge Orders    None       Celia Friedland, Mayer Masker, MD 10/21/20 [phone]

## 2020-10-22 ENCOUNTER — Inpatient Hospital Stay (HOSPITAL_COMMUNITY): Payer: Self-pay

## 2020-10-22 ENCOUNTER — Encounter (HOSPITAL_COMMUNITY): Payer: Self-pay | Admitting: Internal Medicine

## 2020-10-22 DIAGNOSIS — I5021 Acute systolic (congestive) heart failure: Secondary | ICD-10-CM

## 2020-10-22 LAB — URINALYSIS, ROUTINE W REFLEX MICROSCOPIC
Bacteria, UA: NONE SEEN
Bilirubin Urine: NEGATIVE
Glucose, UA: NEGATIVE mg/dL
Hgb urine dipstick: NEGATIVE
Ketones, ur: NEGATIVE mg/dL
Nitrite: NEGATIVE
Protein, ur: NEGATIVE mg/dL
Specific Gravity, Urine: 1.014 (ref 1.005–1.030)
pH: 7 (ref 5.0–8.0)

## 2020-10-22 LAB — COMPREHENSIVE METABOLIC PANEL
ALT: 119 U/L — ABNORMAL HIGH (ref 0–44)
AST: 54 U/L — ABNORMAL HIGH (ref 15–41)
Albumin: 3 g/dL — ABNORMAL LOW (ref 3.5–5.0)
Alkaline Phosphatase: 77 U/L (ref 38–126)
Anion gap: 9 (ref 5–15)
BUN: 22 mg/dL — ABNORMAL HIGH (ref 6–20)
CO2: 28 mmol/L (ref 22–32)
Calcium: 8.3 mg/dL — ABNORMAL LOW (ref 8.9–10.3)
Chloride: 99 mmol/L (ref 98–111)
Creatinine, Ser: 0.92 mg/dL (ref 0.61–1.24)
GFR, Estimated: >60 mL/min (ref >60)
Glucose, Bld: 110 mg/dL — ABNORMAL HIGH (ref 70–99)
Potassium: 3.6 mmol/L (ref 3.5–5.1)
Sodium: 136 mmol/L (ref 135–145)
Total Bilirubin: 0.9 mg/dL (ref 0.3–1.2)
Total Protein: 5.6 g/dL — ABNORMAL LOW (ref 6.5–8.1)

## 2020-10-22 LAB — BLOOD GAS, ARTERIAL
Acid-Base Excess: 3.9 mmol/L — ABNORMAL HIGH (ref 0.0–2.0)
Bicarbonate: 27.9 mmol/L (ref 20.0–28.0)
FIO2: 21
O2 Saturation: 93.9 %
Patient temperature: 37
pCO2 arterial: 38.5 mmHg (ref 32.0–48.0)
pH, Arterial: 7.467 — ABNORMAL HIGH (ref 7.350–7.450)
pO2, Arterial: 69.8 mmHg — ABNORMAL LOW (ref 83.0–108.0)

## 2020-10-22 LAB — RAPID URINE DRUG SCREEN, HOSP PERFORMED
Amphetamines: NOT DETECTED
Barbiturates: NOT DETECTED
Benzodiazepines: POSITIVE — AB
Cocaine: POSITIVE — AB
Opiates: NOT DETECTED
Tetrahydrocannabinol: POSITIVE — AB

## 2020-10-22 LAB — CBC
HCT: 38.2 % — ABNORMAL LOW (ref 39.0–52.0)
Hemoglobin: 11.7 g/dL — ABNORMAL LOW (ref 13.0–17.0)
MCH: 27.9 pg (ref 26.0–34.0)
MCHC: 30.6 g/dL (ref 30.0–36.0)
MCV: 91.2 fL (ref 80.0–100.0)
Platelets: 390 10*3/uL (ref 150–400)
RBC: 4.19 MIL/uL — ABNORMAL LOW (ref 4.22–5.81)
RDW: 16.7 % — ABNORMAL HIGH (ref 11.5–15.5)
WBC: 5.6 10*3/uL (ref 4.0–10.5)
nRBC: 0 % (ref 0.0–0.2)

## 2020-10-22 LAB — MAGNESIUM: Magnesium: 1.9 mg/dL (ref 1.7–2.4)

## 2020-10-22 MED ORDER — LOSARTAN POTASSIUM 25 MG PO TABS
12.5000 mg | ORAL_TABLET | Freq: Every day | ORAL | Status: DC
  Administered 2020-10-22: 12.5 mg via ORAL
  Filled 2020-10-22 (×3): qty 1

## 2020-10-22 MED ORDER — POTASSIUM CHLORIDE CRYS ER 20 MEQ PO TBCR
40.0000 meq | EXTENDED_RELEASE_TABLET | Freq: Every day | ORAL | Status: DC
  Administered 2020-10-23: 40 meq via ORAL
  Filled 2020-10-22: qty 2

## 2020-10-22 MED ORDER — ONDANSETRON HCL 4 MG/2ML IJ SOLN: 4.0000 mg | Freq: Four times a day (QID) | INTRAMUSCULAR | Status: DC | PRN

## 2020-10-22 MED ORDER — POTASSIUM CHLORIDE CRYS ER 20 MEQ PO TBCR
20.0000 meq | EXTENDED_RELEASE_TABLET | Freq: Once | ORAL | Status: AC
  Administered 2020-10-22: 20 meq via ORAL
  Filled 2020-10-22: qty 1

## 2020-10-22 MED ORDER — MAGNESIUM OXIDE -MG SUPPLEMENT 400 (240 MG) MG PO TABS: 400.0000 mg | ORAL_TABLET | Freq: Once | ORAL | Status: DC

## 2020-10-22 MED ORDER — POTASSIUM CHLORIDE CRYS ER 20 MEQ PO TBCR
40.0000 meq | EXTENDED_RELEASE_TABLET | Freq: Once | ORAL | Status: AC
  Administered 2020-10-22: 40 meq via ORAL
  Filled 2020-10-22: qty 2

## 2020-10-22 MED ORDER — MAGNESIUM SULFATE IN D5W 1-5 GM/100ML-% IV SOLN
1.0000 g | Freq: Once | INTRAVENOUS | Status: AC
  Administered 2020-10-22: 1 g via INTRAVENOUS
  Filled 2020-10-22: qty 100

## 2020-10-22 NOTE — Progress Notes (Signed)
Pt given graham crackers and ginger ale. Pt reminded of fluid restricted diet. Pt verbalized understanding.

## 2020-10-22 NOTE — Plan of Care (Signed)

## 2020-10-22 NOTE — Progress Notes (Signed)
PROGRESS NOTE    Kyle Peck  PYK:[id number] DOB: 22-Feb-1961 DOA: 10/20/2020 PCP: Pcp, No   Brief Narrative:   Kyle Belcheris a 60 y.o.malewith medical history significant fortobacco use and occasional alcohol use who presents to the emergency department due to several days of leg and abdominal swelling.  He was thought to potentially have new-onset CHF and has been started on IV Lasix twice daily for diuresis.  2D echocardiogram demonstrates LVEF of 15% with global hypokinesis.  Cardiology consultation will be obtained.  He continues to do well with ongoing diuresis.  Assessment & Plan:   Principal Problem:   Elevated brain natriuretic peptide (BNP) level Active Problems:   CHF (congestive heart failure) (HCC)   Transaminitis   Thrombocytosis   Hypoalbuminemia due to protein-calorie malnutrition (HCC)   Tobacco use   Acute systolic CHF decompensation in the setting of severe cardiomyopathy -LVEF 15% -Appreciate cardiology evaluation with TSH, EKG, and urine drug screen pending -Continue IV Lasix 40 mg twice daily with -4 L diuresis over 24 hours -Monitor daily weights and strict I's and O's  Transaminitis-downtrending -Recheck labs in a.m. -Right upper quadrant ultrasound with findings of ascites and possible cirrhosis -Hepatitis panel nonreactive -Continue to monitor a.m. labs  Hypoalbuminemia -Likely related to mild protein calorie malnutrition  History of alcohol use -CIWA protocol -Counseled on cessation of alcohol  History of tobacco abuse -Counseled on cessation   DVT prophylaxis: Lovenox Code Status: Full Family Communication: Discussed with daughter on phone 5/20 Disposition Plan:  Status is: Inpatient  Remains inpatient appropriate because:Ongoing diagnostic testing needed not appropriate for outpatient work up, IV treatments appropriate due to intensity of illness or inability to take PO and Inpatient level of care appropriate due to severity of  illness   Dispo: The patient is from: Home              Anticipated d/c is to: Home              Patient currently is not medically stable to d/c.   Difficult to place patient No   Consultants:   Cardiology  Procedures:   See below  Antimicrobials:   None   Subjective: Patient seen and evaluated today with no new acute complaints or concerns. No acute concerns or events noted overnight.  He states that he is starting to feel better after his diuresis.  Objective: Vitals:   10/21/20 2053 10/22/20 0010 10/22/20 0423 10/22/20 0600  BP: 105/79 112/79 110/72   Pulse: 100 (!) 110 (!) 102   Resp: 18 18 18    Temp: 97.6 F (36.4 C) 98.2 F (36.8 C) 98.3 F (36.8 C)   TempSrc:   Oral   SpO2: 99% 99% 94%   Weight:    95.8 kg  Height:        Intake/Output Summary (Last 24 hours) at 10/22/2020 1137 Last data filed at 10/22/2020 0900 Gross per 24 hour  Intake --  Output 2900 ml  Net -2900 ml   Filed Weights   10/21/20 0000 10/21/20 1353 10/22/20 0600  Weight: 90.7 kg 95.2 kg 95.8 kg    Examination:  General exam: Appears calm and comfortable  Respiratory system: Clear to auscultation. Respiratory effort normal. Cardiovascular system: S1 & S2 heard, RRR.  Gastrointestinal system: Abdomen is minimally distended Central nervous system: Alert and awake Extremities: Ongoing pitting edema 1+ to midshin bilaterally Skin: No significant lesions noted Psychiatry: Flat affect.    Data Reviewed: I have personally reviewed following labs and  imaging studies  CBC: Recent Labs  Lab 10/21/20 0033 10/21/20 0404 10/22/20 0434  WBC 5.6 6.4 5.6  NEUTROABS 4.0  --   --   HGB 12.4* 12.7* 11.7*  HCT 42.0 42.6 38.2*  MCV 93.1 93.6 91.2  PLT 431* 435* 390   Basic Metabolic Panel: Recent Labs  Lab 10/21/20 0033 10/21/20 0404 10/22/20 0434  NA 137 138 136  K 3.8 4.3 3.6  CL 103 103 99  CO2 26 27 28   GLUCOSE 126* 104* 110*  BUN 20 20 22*  CREATININE 1.09 1.05 0.92   CALCIUM 8.2* 8.5* 8.3*  MG  --  2.1 1.9  PHOS  --  4.1  --    GFR: Estimated Creatinine Clearance: 97.5 mL/min (by C-G formula based on SCr of 0.92 mg/dL). Liver Function Tests: Recent Labs  Lab 10/21/20 0033 10/21/20 0404 10/22/20 0434  AST 91* 86* 54*  ALT 173* 167* 119*  ALKPHOS 81 83 77  BILITOT 1.0 1.0 0.9  PROT 6.4* 6.4* 5.6*  ALBUMIN 3.3* 3.4* 3.0*   No results for input(s): LIPASE, AMYLASE in the last 168 hours. Recent Labs  Lab 10/21/20 2114  AMMONIA 12   Coagulation Profile: Recent Labs  Lab 10/21/20 0404  INR 1.3*   Cardiac Enzymes: No results for input(s): CKTOTAL, CKMB, CKMBINDEX, TROPONINI in the last 168 hours. BNP (last 3 results) No results for input(s): PROBNP in the last 8760 hours. HbA1C: No results for input(s): HGBA1C in the last 72 hours. CBG: No results for input(s): GLUCAP in the last 168 hours. Lipid Profile: No results for input(s): CHOL, HDL, LDLCALC, TRIG, CHOLHDL, LDLDIRECT in the last 72 hours. Thyroid Function Tests: No results for input(s): TSH, T4TOTAL, FREET4, T3FREE, THYROIDAB in the last 72 hours. Anemia Panel: No results for input(s): VITAMINB12, FOLATE, FERRITIN, TIBC, IRON, RETICCTPCT in the last 72 hours. Sepsis Labs: No results for input(s): PROCALCITON, LATICACIDVEN in the last 168 hours.  Recent Results (from the past 240 hour(s))  SARS CORONAVIRUS 2 (TAT 6-24 HRS) Nasopharyngeal Nasopharyngeal Swab     Status: None   Collection Time: 10/21/20  2:00 AM   Specimen: Nasopharyngeal Swab  Result Value Ref Range Status   SARS Coronavirus 2 NEGATIVE NEGATIVE Final    Comment: (NOTE) SARS-CoV-2 target nucleic acids are NOT DETECTED.  The SARS-CoV-2 RNA is generally detectable in upper and lower respiratory specimens during the acute phase of infection. Negative results do not preclude SARS-CoV-2 infection, do not rule out co-infections with other pathogens, and should not be used as the sole basis for treatment or  other patient management decisions. Negative results must be combined with clinical observations, patient history, and epidemiological information. The expected result is Negative.  Fact Sheet for Patients: 10/23/20  Fact Sheet for Healthcare Providers: HairSlick.no  This test is not yet approved or cleared by the quierodirigir.com FDA and  has been authorized for detection and/or diagnosis of SARS-CoV-2 by FDA under an Emergency Use Authorization (EUA). This EUA will remain  in effect (meaning this test can be used) for the duration of the COVID-19 declaration under Se ction 564(b)(1) of the Act, 21 U.S.C. section 360bbb-3(b)(1), unless the authorization is terminated or revoked sooner.  Performed at [facility], [address], [place], [place]          Radiology Studies: DG Chest 2 View  Result Date: 10/21/2020 CLINICAL DATA:  anasarca; fluid retention EXAM: CHEST - 2 VIEW COMPARISON:  None. FINDINGS: Cardiomegaly with mild  prominence of the central vasculature. Aortic atherosclerosis. No focal consolidation or overt pulmonary edema. No pleural effusion or pneumothorax the visualized skeletal structures are unremarkable. IMPRESSION: Cardiomegaly with mild prominence of the central vasculature. No visible pleural effusion or overt pulmonary edema. Aortic Atherosclerosis (ICD10-I70.0). Electronically Signed   By: Maudry Mayhew MD   On: 10/21/2020 00:57   ECHOCARDIOGRAM COMPLETE  Result Date: 10/21/2020    ECHOCARDIOGRAM REPORT   Patient Name:   MALAKI KOURY Date of Exam: 10/21/2020 Medical Rec #:  [id number]    Height:       69.0 in Accession #:    [id number]   Weight:       200.0 lb Date of Birth:  [birth year]    BSA:          2.066 m Patient Age:    60 years     BP:           129/87 mmHg Patient Gender: M            HR:           101 bpm. [facility] Procedure: 2D Echo Indications:     Congestive Heart Failure I50.9  History:        Patient has no prior history of Echocardiogram examinations.                 CHF; Risk Factors:Current Smoker. Volume overload with elevated                 BNP, ETOH, Not been to doctor in 20 years per patient.  Sonographer:    Jeryl Columbia RDCS (AE) Referring Phys: [phone] Lamont Dowdy [facility] IMPRESSIONS  1. Left ventricular ejection fraction, by estimation, is 15%. The left ventricle has severely decreased function. The left ventricle demonstrates global hypokinesis. The left ventricular internal cavity size was moderately dilated. Left ventricular diastolic parameters are indeterminate.  2. Right ventricular systolic function is mildly reduced. The right ventricular size is moderately enlarged.  3. Left atrial size was severely dilated.  4. Right atrial size was mildly dilated.  5. The mitral valve is normal in structure. Mild mitral valve regurgitation. No evidence of mitral stenosis.  6. The aortic valve has an indeterminant number of cusps. There is mild calcification of the aortic valve. There is mild thickening of the aortic valve. Aortic valve regurgitation is not visualized. No aortic stenosis is present.  7. The inferior vena cava is dilated in size with <50% respiratory variability, suggesting right atrial pressure of 15 mmHg. FINDINGS  Left Ventricle: Left ventricular ejection fraction, by estimation, is 15%. The left ventricle has severely decreased function. The left ventricle demonstrates global hypokinesis. The left ventricular internal cavity size was moderately dilated. There is  no left ventricular hypertrophy. Left ventricular diastolic parameters are indeterminate. Right Ventricle: The right ventricular size is moderately enlarged. Right vetricular wall thickness was not assessed. Right ventricular systolic function is mildly reduced. Left Atrium: Left atrial size was severely dilated. Right Atrium: Right atrial size was mildly dilated. Pericardium:  There is no evidence of pericardial effusion. Mitral Valve: The mitral valve is normal in structure. Mild mitral valve regurgitation. No evidence of mitral valve stenosis. Tricuspid Valve: The tricuspid valve is not well visualized. Tricuspid valve regurgitation is not demonstrated. Aortic Valve: The aortic valve has an indeterminant number of cusps. There is mild calcification of the aortic valve. There is mild thickening of the aortic valve. There is mild aortic valve annular calcification.  Aortic valve regurgitation is not visualized. No aortic stenosis is present. Aortic valve mean gradient measures 1.4 mmHg. Aortic valve peak gradient measures 2.8 mmHg. Aortic valve area, by VTI measures 2.79 cm. Pulmonic Valve: The pulmonic valve was not well visualized. Pulmonic valve regurgitation is not visualized. No evidence of pulmonic stenosis. Aorta: The aortic root is normal in size and structure. Pulmonary Artery: Indeterminant PASP, inadequate TR jet. Venous: The inferior vena cava is dilated in size with less than 50% respiratory variability, suggesting right atrial pressure of 15 mmHg. IAS/Shunts: No atrial level shunt detected by color flow Doppler.  LEFT VENTRICLE PLAX 2D LVIDd:         6.49 cm  Diastology LVIDs:         5.57 cm  LV e' medial:    7.80 cm/s LV PW:         0.90 cm  LV E/e' medial:  11.5 LV IVS:        0.81 cm  LV e' lateral:   5.87 cm/s LVOT diam:     2.10 cm  LV E/e' lateral: 15.3 LV SV:         35 LV SV Index:   17 LVOT Area:     3.46 cm  RIGHT VENTRICLE RV S prime:     8.67 cm/s TAPSE (M-mode): 2.0 cm LEFT ATRIUM              Index       RIGHT ATRIUM           Index LA diam:        6.20 cm  3.00 cm/m  RA Area:     21.00 cm LA Vol (A2C):   110.0 ml 53.24 ml/m RA Volume:   58.40 ml  28.27 ml/m LA Vol (A4C):   103.0 ml 49.85 ml/m LA Biplane Vol: 112.0 ml 54.21 ml/m  AORTIC VALVE AV Area (Vmax):    2.44 cm AV Area (Vmean):   2.22 cm AV Area (VTI):     2.79 cm AV Vmax:           83.48  cm/s AV Vmean:          53.395 cm/s AV VTI:            0.124 m AV Peak Grad:      2.8 mmHg AV Mean Grad:      1.4 mmHg LVOT Vmax:         58.76 cm/s LVOT Vmean:        34.163 cm/s LVOT VTI:          0.100 m LVOT/AV VTI ratio: 0.80  AORTA Ao Root diam: 3.50 cm MITRAL VALVE MV Area (PHT): 3.15 cm    SHUNTS MV Decel Time: 241 msec    Systemic VTI:  0.10 m MV E velocity: 89.60 cm/s  Systemic Diam: 2.10 cm MV A velocity: 36.80 cm/s MV E/A ratio:  2.43 Dina Rich MD Electronically signed by Dina Rich MD Signature Date/Time: 10/21/2020/3:25:28 PM    Final    US Abdomen Limited RUQ (LIVER/GB)  Result Date: 10/21/2020 CLINICAL DATA:  Transaminitis. EXAM: ULTRASOUND ABDOMEN LIMITED RIGHT UPPER QUADRANT COMPARISON:  None. FINDINGS: Gallbladder: Thickened gallbladder wall varying between 0.4 and 0.6 cm. Pericholecystic fluid. Sonographic Murphy's sign absent. No directly visualized gallstones. Common bile duct: Diameter: 0.5 cm. Equivocal debris/echogenicity within the common bile duct. Liver: Components of the liver have a nodular contour for example on image 33 series 1. No focal hepatic  mass is observed. Perihepatic ascites. Patent hepatic vein tributaries. Portal vein is patent on color Doppler imaging with normal direction of blood flow towards the liver. Other: None. IMPRESSION: 1. Gallbladder wall thickening without gallstones identified. Although this could be inflammatory, it could also be caused by the patient's hypoproteinemia/hypoalbuminemia. Sonographic Murphy's sign absent. 2. Mildly nodular liver contour, cirrhosis is not excluded. 3. No biliary dilatation. Equivocal debris/echogenicity in the common bile duct. 4. Perihepatic and pericholecystic ascites. Electronically Signed   By: Gaylyn RongWalter  Liebkemann M.D.   On: 10/21/2020 10:05        Scheduled Meds: . enoxaparin (LOVENOX) injection  40 mg Subcutaneous Q24H  . feeding supplement  237 mL Oral BID BM  . folic acid  1 mg Oral Daily  .  furosemide  40 mg Intravenous Q12H  . multivitamin with minerals  1 tablet Oral Daily  . thiamine  100 mg Oral Daily   Or  . thiamine  100 mg Intravenous Daily    LOS: 1 day    Time spent: 35 minutes    Nalin Mazzocco Hoover Brunette Promyse Ardito, DO [facility]  If 7PM-7AM, please contact night-coverage [email or web address] 10/22/2020, 11:37 AM

## 2020-10-22 NOTE — Progress Notes (Signed)
Patient woke up, and was disoriented as to his location.  Patient was very anxious and was concerned about location of his family.  Explained to patient that he was in the hospital and family as at home.  Patient very concerned, expresses much love and concern for family but is very anxious exhibited by him pulling on sheets and rapid speech.  Patient also concerned about his medical diagnosis. Explained to patient all test results would be explained to him, and he should anticipate encouragement for a healthier lifestyle.  Patient spoke with daughter and wife via phone.  Ciwa protocol followed.

## 2020-10-22 NOTE — Consult Note (Addendum)
Cardiology Consultation:   Patient ID: Kyle Peck MRN: [id number]; DOB: 1960-07-01  Admit date: 10/20/2020 Date of Consult: 10/22/2020  PCP:  Oneita Hurt No   [facility] Providers Cardiologist:  New to Dr. Salvadore Oxford here to update MD or APP on Care Team, Refresh:1}     Patient Profile:   Kyle Peck is a 60 y.o. male with a hx of arthritis, ETOH use, tobacco use, THC use, and prior cocaine exposure who is being seen 10/22/2020 for the evaluation of acute systolic CHF at the request of Dr. Sherryll Burger.  History of Present Illness:   Kyle Peck denies any prior cardiac history or any significant PMH aside from seeing ortho for knee problems. He recalls a history of episodic LEE, but it usually would resolve on its own. About 6 weeks ago he developed progressive malaise, dyspnea on exertion, and progressive edema that started in his legs and migrated up to his thighs, scrotum and abdomen. He had a coworker die of Covid at age 51 with symptoms of orthopnea prior to their death so he had also been aware of this symptom lately. He denies any chest pain, palpitations, syncope. Due to progressive decline he came to the hospital where he was found to have new onset systolic CHF with EF 15%. Echo also showed moderately dilated LV, mildly reduced RV function with moderate enlargement, severe LAE, mild RAE, mild MR, dilated IVC. Labs showed elevated LFTs, BNP 2252, thrombocytosis of 431, negative Covid, normal ammonia. CXR showed cardiomegaly with prominence of central vasculature. RUQ US showed GB wall thickening without gallstones, mildly nodular liver contour (cannot exclude cirrhosis), perihepatic/pericholecystic ascites. He is mildly tachycardic (sinus) with normal BP. He has been started on IV Lasix and appears to be diuresing well. His edema is improving.  The patient reports a history of prior alcohol use - it is difficult to get a clear answer but sounds like at one point he was drinking daily, perhaps  a pint of liquor. He also reports he has previously been "around cocaine." A few weeks ago he went to a reunion party and smoked a blunt that he thinks may have been tainted with cocaine. There may have been prior cocaine exposure as well in the past. He does not wish to discuss his substance use with wife/daughter in the room hence why this note was marked for privacy in OpenNotes.    Past Medical History:  Diagnosis Date  . Alcohol use   . Arthritis   . Marijuana use   . Tobacco abuse     Past Surgical History:  Procedure Laterality Date  . FRACTURE SURGERY     cheek      Home Medications:  Prior to Admission medications   Medication Sig Start Date End Date Taking? Authorizing Provider  Famotidine (PEPCID PO) Take 1 tablet by mouth daily as needed (indigestion).   Yes [provider]    Inpatient Medications: Scheduled Meds: . enoxaparin (LOVENOX) injection  40 mg Subcutaneous Q24H  . feeding supplement  237 mL Oral BID BM  . folic acid  1 mg Oral Daily  . furosemide  40 mg Intravenous Q12H  . multivitamin with minerals  1 tablet Oral Daily  . thiamine  100 mg Oral Daily   Or  . thiamine  100 mg Intravenous Daily   Continuous Infusions:  PRN Meds: diphenhydrAMINE, LORazepam **OR** LORazepam  Allergies:   No Known Allergies  Social History:   Social History   Socioeconomic History  . Marital  status: Married    Spouse name: Eber Jones  . Number of children: Not on file  . Years of education: Not on file  . Highest education level: Not on file  Occupational History  . Not on file  Tobacco Use  . Smoking status: Current Some Day Smoker    Packs/day: 0.50    Types: Cigars, Cigarettes  . Smokeless tobacco: Never Used  Vaping Use  . Vaping Use: Never used  Substance and Sexual Activity  . Alcohol use: Yes    Alcohol/week: 15.0 standard drinks    Types: 15 Shots of liquor per week    Comment: intermittently  . Drug use: Yes    Frequency: 2.0 times per  week    Types: Marijuana    Comment: "also around cocaine before"  . Sexual activity: Not on file  Other Topics Concern  . Not on file  Social History Narrative  . Not on file   Social Determinants of Health   Financial Resource Strain: Not on file  Food Insecurity: Not on file  Transportation Needs: Not on file  Physical Activity: Not on file  Stress: Not on file  Social Connections: Not on file  Intimate Partner Violence: Not on file    Family History:    Family History  Problem Relation Age of Onset  . Congestive Heart Failure Mother   . Congestive Heart Failure Father      ROS:  Please see the history of present illness.  All other ROS reviewed and negative.     Physical Exam/Data:   Vitals:   10/21/20 2053 10/22/20 0010 10/22/20 0423 10/22/20 0600  BP: 105/79 112/79 110/72   Pulse: 100 (!) 110 (!) 102   Resp: 18 18 18    Temp: 97.6 F (36.4 C) 98.2 F (36.8 C) 98.3 F (36.8 C)   TempSrc:   Oral   SpO2: 99% 99% 94%   Weight:    95.8 kg  Height:        Intake/Output Summary (Last 24 hours) at 10/22/2020 1153 Last data filed at 10/22/2020 0900 Gross per 24 hour  Intake --  Output 2900 ml  Net -2900 ml   Last 3 Weights 10/22/2020 10/21/2020 10/21/2020  Weight (lbs) 211 lb 3.2 oz 209 lb 14.1 oz 200 lb  Weight (kg) 95.8 kg 95.2 kg 90.719 kg     Body mass index is 31.19 kg/m.  General: Well developed, well nourished WM in no acute distress. Head: Normocephalic, atraumatic, sclera non-icteric, no xanthomas, nares are without discharge. Neck: Negative for carotid bruits. JVP appears elevated. Lungs: Faint bilateral crackles at bases, mildly decreased BS throughout, no wheezing or rhonchi. Breathing is unlabored. Heart: RRR S1 S2 without murmurs, rubs, or gallops.  Abdomen: Soft, non-tender, rounded with normoactive bowel sounds. No rebound/guarding. Extremities: No clubbing or cyanosis. 1+ BLE pedal to mid shin. Distal pedal pulses are 2+ and equal  bilaterally. Neuro: Alert and oriented X 3. Moves all extremities spontaneously. Psych:  Responds to questions appropriately with a normal affect but fast cadence of speech, making him challenging to understand at times  EKG:  The EKG was personally reviewed and demonstrates:  Sinus tach 106bpm first degree AVB, old anterior infarct, possible LAE, one ectopic beat, nonspecific ST changes  Telemetry:  Telemetry was personally reviewed and demonstrates:  NSR with occasional PVCs, 5 beat run NSVT  Relevant CV Studies: 2d echo 10/21/20 1. Left ventricular ejection fraction, by estimation, is 15%. The left  ventricle has severely decreased  function. The left ventricle demonstrates  global hypokinesis. The left ventricular internal cavity size was  moderately dilated. Left ventricular  diastolic parameters are indeterminate.  2. Right ventricular systolic function is mildly reduced. The right  ventricular size is moderately enlarged.  3. Left atrial size was severely dilated.  4. Right atrial size was mildly dilated.  5. The mitral valve is normal in structure. Mild mitral valve  regurgitation. No evidence of mitral stenosis.  6. The aortic valve has an indeterminant number of cusps. There is mild  calcification of the aortic valve. There is mild thickening of the aortic  valve. Aortic valve regurgitation is not visualized. No aortic stenosis is  present.  7. The inferior vena cava is dilated in size with <50% respiratory  variability, suggesting right atrial pressure of 15 mmHg.   Laboratory Data:  High Sensitivity Troponin:  No results for input(s): TROPONINIHS in the last 720 hours.   Chemistry Recent Labs  Lab 10/21/20 0033 10/21/20 0404 10/22/20 0434  NA 137 138 136  K 3.8 4.3 3.6  CL 103 103 99  CO2 GLUCOSE 126* 104* 110*  BUN 20 20 22*  CREATININE 1.09 1.05 0.92  CALCIUM 8.2* 8.5* 8.3*  GFRNONAA >60 >60 >60  ANIONGAP Recent Labs  Lab  10/21/20 0033 10/21/20 0404 10/22/20 0434  PROT 6.4* 6.4* 5.6*  ALBUMIN 3.3* 3.4* 3.0*  AST 91* 86* 54*  ALT 173* 167* 119*  ALKPHOS 81 83 77  BILITOT 1.0 1.0 0.9   Hematology Recent Labs  Lab 10/21/20 0033 10/21/20 0404 10/22/20 0434  WBC 5.6 6.4 5.6  RBC 4.51 4.55 4.19*  HGB 12.4* 12.7* 11.7*  HCT 42.0 42.6 38.2*  MCV 93.1 93.6 91.2  MCH 27.5 27.9 27.9  MCHC 29.5* 29.8* 30.6  RDW 16.9* 16.9* 16.7*  PLT 431* 435* 390   BNP Recent Labs  Lab 10/21/20 0033  BNP 2,252.0*    DDimer No results for input(s): DDIMER in the last 168 hours.   Radiology/Studies:  DG Chest 2 View  Result Date: 10/21/2020 CLINICAL DATA:  anasarca; fluid retention EXAM: CHEST - 2 VIEW COMPARISON:  None. FINDINGS: Cardiomegaly with mild prominence of the central vasculature. Aortic atherosclerosis. No focal consolidation or overt pulmonary edema. No pleural effusion or pneumothorax the visualized skeletal structures are unremarkable. IMPRESSION: Cardiomegaly with mild prominence of the central vasculature. No visible pleural effusion or overt pulmonary edema. Aortic Atherosclerosis (ICD10-I70.0). Electronically Signed   By: Maudry Mayhew MD   On: 10/21/2020 00:57   ECHOCARDIOGRAM COMPLETE  Result Date: 10/21/2020    ECHOCARDIOGRAM REPORT   Patient Name:   Kyle Peck Date of Exam: 10/21/2020 Medical Rec #:  [id number]    Height:       69.0 in Accession #:    [id number]   Weight:       200.0 lb Date of Birth:  1960/11/14    BSA:          2.066 m Patient Age:    60 years     BP:           129/87 mmHg Patient Gender: M            HR:           101 bpm. [facility] Procedure: 2D Echo Indications:    Congestive Heart Failure I50.9  History:        Patient has no prior history of  Echocardiogram examinations.                 CHF; Risk Factors:Current Smoker. Volume overload with elevated                 BNP, ETOH, Not been to doctor in 20 years per patient.  Sonographer:    Jeryl Columbia RDCS  (AE) Referring Phys: [phone] [facility] IMPRESSIONS  1. Left ventricular ejection fraction, by estimation, is 15%. The left ventricle has severely decreased function. The left ventricle demonstrates global hypokinesis. The left ventricular internal cavity size was moderately dilated. Left ventricular diastolic parameters are indeterminate.  2. Right ventricular systolic function is mildly reduced. The right ventricular size is moderately enlarged.  3. Left atrial size was severely dilated.  4. Right atrial size was mildly dilated.  5. The mitral valve is normal in structure. Mild mitral valve regurgitation. No evidence of mitral stenosis.  6. The aortic valve has an indeterminant number of cusps. There is mild calcification of the aortic valve. There is mild thickening of the aortic valve. Aortic valve regurgitation is not visualized. No aortic stenosis is present.  7. The inferior vena cava is dilated in size with <50% respiratory variability, suggesting right atrial pressure of 15 mmHg. FINDINGS  Left Ventricle: Left ventricular ejection fraction, by estimation, is 15%. The left ventricle has severely decreased function. The left ventricle demonstrates global hypokinesis. The left ventricular internal cavity size was moderately dilated. There is  no left ventricular hypertrophy. Left ventricular diastolic parameters are indeterminate. Right Ventricle: The right ventricular size is moderately enlarged. Right vetricular wall thickness was not assessed. Right ventricular systolic function is mildly reduced. Left Atrium: Left atrial size was severely dilated. Right Atrium: Right atrial size was mildly dilated. Pericardium: There is no evidence of pericardial effusion. Mitral Valve: The mitral valve is normal in structure. Mild mitral valve regurgitation. No evidence of mitral valve stenosis. Tricuspid Valve: The tricuspid valve is not well visualized. Tricuspid valve regurgitation is not demonstrated. Aortic Valve:  The aortic valve has an indeterminant number of cusps. There is mild calcification of the aortic valve. There is mild thickening of the aortic valve. There is mild aortic valve annular calcification. Aortic valve regurgitation is not visualized. No aortic stenosis is present. Aortic valve mean gradient measures 1.4 mmHg. Aortic valve peak gradient measures 2.8 mmHg. Aortic valve area, by VTI measures 2.79 cm. Pulmonic Valve: The pulmonic valve was not well visualized. Pulmonic valve regurgitation is not visualized. No evidence of pulmonic stenosis. Aorta: The aortic root is normal in size and structure. Pulmonary Artery: Indeterminant PASP, inadequate TR jet. Venous: The inferior vena cava is dilated in size with less than 50% respiratory variability, suggesting right atrial pressure of 15 mmHg. IAS/Shunts: No atrial level shunt detected by color flow Doppler.  LEFT VENTRICLE PLAX 2D LVIDd:         6.49 cm  Diastology LVIDs:         5.57 cm  LV e' medial:    7.80 cm/s LV PW:         0.90 cm  LV E/e' medial:  11.5 LV IVS:        0.81 cm  LV e' lateral:   5.87 cm/s LVOT diam:     2.10 cm  LV E/e' lateral: 15.3 LV SV:         35 LV SV Index:   17 LVOT Area:     3.46 cm  RIGHT VENTRICLE RV S  prime:     8.67 cm/s TAPSE (M-mode): 2.0 cm LEFT ATRIUM              Index       RIGHT ATRIUM           Index LA diam:        6.20 cm  3.00 cm/m  RA Area:     21.00 cm LA Vol (A2C):   110.0 ml 53.24 ml/m RA Volume:   58.40 ml  28.27 ml/m LA Vol (A4C):   103.0 ml 49.85 ml/m LA Biplane Vol: 112.0 ml 54.21 ml/m  AORTIC VALVE AV Area (Vmax):    2.44 cm AV Area (Vmean):   2.22 cm AV Area (VTI):     2.79 cm AV Vmax:           83.48 cm/s AV Vmean:          53.395 cm/s AV VTI:            0.124 m AV Peak Grad:      2.8 mmHg AV Mean Grad:      1.4 mmHg LVOT Vmax:         58.76 cm/s LVOT Vmean:        34.163 cm/s LVOT VTI:          0.100 m LVOT/AV VTI ratio: 0.80  AORTA Ao Root diam: 3.50 cm MITRAL VALVE MV Area (PHT): 3.15 cm     SHUNTS MV Decel Time: 241 msec    Systemic VTI:  0.10 m MV E velocity: 89.60 cm/s  Systemic Diam: 2.10 cm MV A velocity: 36.80 cm/s MV E/A ratio:  2.43 Dina Rich MD Electronically signed by Dina Rich MD Signature Date/Time: 10/21/2020/3:25:28 PM    Final    US Abdomen Limited RUQ (LIVER/GB)  Result Date: 10/21/2020 CLINICAL DATA:  Transaminitis. EXAM: ULTRASOUND ABDOMEN LIMITED RIGHT UPPER QUADRANT COMPARISON:  None. FINDINGS: Gallbladder: Thickened gallbladder wall varying between 0.4 and 0.6 cm. Pericholecystic fluid. Sonographic Murphy's sign absent. No directly visualized gallstones. Common bile duct: Diameter: 0.5 cm. Equivocal debris/echogenicity within the common bile duct. Liver: Components of the liver have a nodular contour for example on image 33 series 1. No focal hepatic mass is observed. Perihepatic ascites. Patent hepatic vein tributaries. Portal vein is patent on color Doppler imaging with normal direction of blood flow towards the liver. Other: None. IMPRESSION: 1. Gallbladder wall thickening without gallstones identified. Although this could be inflammatory, it could also be caused by the patient's hypoproteinemia/hypoalbuminemia. Sonographic Murphy's sign absent. 2. Mildly nodular liver contour, cirrhosis is not excluded. 3. No biliary dilatation. Equivocal debris/echogenicity in the common bile duct. 4. Perihepatic and pericholecystic ascites. Electronically Signed   By: Gaylyn Rong M.D.   On: 10/21/2020 10:05     Assessment and Plan:   1. Acute systolic CHF - LVEF 15%, also with RV dysfunction as well - given hx of ETOH +/- cocaine use, this may reflect NICM but given severity of LV dysfunction it also seems prudent to investigate for obstructive coronary disease this admission - will discuss timing of R/LHC with patient - I did discuss consent with him and he is agreeable to proceed. He seems very concerned and wants to know what he can do to get his heart better.  We discussed the nature of CHF, treatment typically involved and importance of avoidances of harmful substances - seems to have diuresed substantially so far, still with mild pedal edema and crackles so would continue IV lasix  through today - hold off BB given acute decompensation - will discuss ARB with MD - not sure BP will support Entresto but worth considering  Shared Decision Making/Informed Consent The risks [stroke (1 in 1000), death (1 in 1000), kidney failure [usually temporary] (1 in 500), bleeding (1 in 200), allergic reaction [possibly serious] (1 in 200)], benefits (diagnostic support and management of coronary artery disease) and alternatives of a cardiac catheterization were discussed in detail with Kyle Peck and he is willing to proceed.  2. Polysubstance use - difficult to get a sense for how much ETOH he was drinking up until he quit several weeks ago, but was daily use at one point - also reports being around cocaine in the past therefore at risk for history of usage - will check UDS to clarify acuity - tobacco cessation advised - he reports that he has no problem walking away from these substances and understands they can be fatal to the heart - will request social work consult  3. Transaminitis - RUQ with ascites/cirrhosis - hepatitis panel nonreactive per medical team - suspect cardio/hepatic in setting of severe heart failure  4. Hypoalbuminemia - may be due in part to protein/calorie malnutrition +/- liver disease but will also check UA for proteinuria  5. Occasional PVCs/1 brief run 5bt NSVT - check TSH - K 3.6 - will give 40meq now, 20meq this evening, then start 40meq daily tomorrow - can adjust if needed - Mg 1.9 therefore will give mag sulfate 1g today - continue to follow lytes   Risk Assessment/Risk Scores:        New York Heart Association (NYHA) Functional Class NYHA Class III   For questions or updates, please contact [facility] Please  consult [email or web address] for contact info under    Signed, Laurann MontanaDayna N Dunn, PA-C  10/22/2020 11:53 AM   Attending note  Patient seen and disucssed with PA Dunn, I agree with her documentation. 60 yo male who has not had regular medical care admitted with LE edema and abdominal swelling.     WBC 5.6 Hgb 12.4 Plt 431 K 3.8 Cr 1.09 BUN 20 AST 91 ALT 173 Tibili 1  BNP 2252  TSH pending COVID neg CXR cardiomegaly EKG sinus tach, anteroseptal Qwaves  Echo LVEF 15%, global hypokinesis, indet diastolic fxn, mild RV dysfunction, severe LAE, mild MR   Patient presents with acute systolic HF, LVEF 15% by echo. Perhaps substance related given EtOH and cocaine use, Qwaves on EKG could suggest undelrying ischemic cardiomyopathy. Continue IV diuresis over the weekend, very good urine output with stable renal function. Would anticipate cath next week once more euvolemic. Avoid beta blocker until more euvolemic and RHC numbers are back given his very low EF. Low normal bp's, would start losartan 12.5mg  daily, pending hemodynamics consider changing to entresto. Likely add aldactone over next few days, farxiga at discharge.   Diurese over the weekend, goal net negative 2-3 L. We will assess next week for timing of inpatient cath. I am on over the weekend at [facility] if any issues diuresing over the weekend   Dina RichJonathan Britny Riel MD

## 2020-10-23 ENCOUNTER — Encounter (HOSPITAL_COMMUNITY): Payer: Self-pay | Admitting: Internal Medicine

## 2020-10-23 LAB — COMPREHENSIVE METABOLIC PANEL
ALT: 109 U/L — ABNORMAL HIGH (ref 0–44)
AST: 44 U/L — ABNORMAL HIGH (ref 15–41)
Albumin: 3.4 g/dL — ABNORMAL LOW (ref 3.5–5.0)
Alkaline Phosphatase: 88 U/L (ref 38–126)
Anion gap: 9 (ref 5–15)
BUN: 22 mg/dL — ABNORMAL HIGH (ref 6–20)
CO2: 28 mmol/L (ref 22–32)
Calcium: 8.8 mg/dL — ABNORMAL LOW (ref 8.9–10.3)
Chloride: 100 mmol/L (ref 98–111)
Creatinine, Ser: 1.01 mg/dL (ref 0.61–1.24)
GFR, Estimated: >60 mL/min (ref >60)
Glucose, Bld: 80 mg/dL (ref 70–99)
Potassium: 4.2 mmol/L (ref 3.5–5.1)
Sodium: 137 mmol/L (ref 135–145)
Total Bilirubin: 1.1 mg/dL (ref 0.3–1.2)
Total Protein: 6.5 g/dL (ref 6.5–8.1)

## 2020-10-23 LAB — MAGNESIUM: Magnesium: 2 mg/dL (ref 1.7–2.4)

## 2020-10-23 LAB — BRAIN NATRIURETIC PEPTIDE: B Natriuretic Peptide: 1685 pg/mL — ABNORMAL HIGH (ref 0.0–100.0)

## 2020-10-23 LAB — TSH: TSH: 3.217 u[IU]/mL (ref 0.350–4.500)

## 2020-10-23 NOTE — Progress Notes (Signed)
Pt becoming more agitated/ restless. Ativan given PO. HR 108 BP 111/84 no sweating or nausea present. No other complaints per pt. Will continue to monitor her CIWA/withdrawl symptoms. MD made aware of yellow mews no new orders.

## 2020-10-23 NOTE — Progress Notes (Addendum)
Patient was found in his chair with blood on his right arm and blood on the bed sheet. I asked patient what happened. Patient stated, " The man in the bathroom pulled my IV out". The IV was not out, the patient had just scratched it and made the dressing come up a bit. He then went on to tell me that there is someone that keeps coming in his room and another man in his bathroom and that his bed is a nightmare. I cleaned patient up and put new dressing on IV site with a guaze wrapping to secure it. NT changed bedding. I just charted the CIWA scale again and this time it was a 9.

## 2020-10-23 NOTE — Progress Notes (Signed)
   10/23/20 1305  Assess: MEWS Score  Temp (!) 97.5 F (36.4 C)  BP 106/81  Pulse Rate (!) 107  Resp 20  SpO2 100 %  O2 Device Room Air  Assess: MEWS Score  MEWS Temp 0  MEWS Systolic 0  MEWS Pulse 1  MEWS RR 0  MEWS LOC 1  MEWS Score 2  MEWS Score Color Yellow  Assess: if the MEWS score is Yellow or Red  Were vital signs taken at a resting state? Yes  Focused Assessment No change from prior assessment  Early Detection of Sepsis Score *See Row Information* Low  MEWS guidelines implemented *See Row Information* Yes  Take Vital Signs  Increase Vital Sign Frequency  Yellow: Q 2hr X 2 then Q 4hr X 2, if remains yellow, continue Q 4hrs  Escalate  MEWS: Escalate Yellow: discuss with charge nurse/RN and consider discussing with provider and RRT  Notify: Charge Nurse/RN  Name of Charge Nurse/RN Notified Elease Hashimoto  Date Charge Nurse/RN Notified 10/23/20  Time Charge Nurse/RN Notified 1345  Notify: Provider  Provider Name/Title Dr Sherryll Burger  Date Provider Notified 10/23/20  Time Provider Notified 1346  Notification Type Page Loretha Stapler)  Notification Reason Other (Comment) (yellow mews)

## 2020-10-23 NOTE — Progress Notes (Signed)
PROGRESS NOTE    Kyle Peck  JKK:[id number] DOB: 08/11/1960 DOA: 10/20/2020 PCP: Pcp, No   Brief Narrative:   Kyle Belcheris a 60 y.o.malewith medical history significant fortobacco use and occasional alcohol use who presents to the emergency department due to several days of leg and abdominal swelling.He was thought to potentially have new-onsetCHF and has been started on IV Lasix twice daily for diuresis. 2D echocardiogram demonstrates LVEF of 15% with global hypokinesis.  Cardiology recommending cardiac catheterization after adequate diuresis.  He does not appear to be significantly volume overloaded, plan to discontinue IV diuresis after morning dose on 5/21.  Assessment & Plan:   Principal Problem:   Elevated brain natriuretic peptide (BNP) level Active Problems:   CHF (congestive heart failure) (HCC)   Transaminitis   Thrombocytosis   Hypoalbuminemia due to protein-calorie malnutrition (HCC)   Tobacco use  Acute systolic CHF decompensation in the setting of severe cardiomyopathy -Likely related to alcohol and polysubstance abuse with prior cocaine use -LVEF 15% -Appreciate cardiology evaluation with TSH 3.2, plans for cardiac catheterization by 5/23 -Continue IV Lasix 40 mg twice daily with -4 L diuresis over 24 hours, no further significant diuresis noted and patient appears to be euvolemic; plan to hold further IV diuresis for now and likely will place on oral Lasix by AM if needed -Monitor daily weights and strict I's and O's  Transaminitis-downtrending -Related to EtOh use -Recheck labs in a.m. -Right upper quadrant ultrasound with findings of ascites and possible cirrhosis -Hepatitis panel nonreactive -Continue to monitor a.m. labs  Hypoalbuminemia -Likely related to mild protein calorie malnutrition  History of alcohol use -CIWA protocol -Counseled on cessation of alcohol  History of tobacco abuse -Counseled on cessation  History of  polysubstance abuse   DVT prophylaxis: Lovenox Code Status: Full Family Communication: Discussed with daughter on phone 5/20 Disposition Plan:  Status is: Inpatient  Remains inpatient appropriate because:Ongoing diagnostic testing needed not appropriate for outpatient work up, IV treatments appropriate due to intensity of illness or inability to take PO and Inpatient level of care appropriate due to severity of illness   Dispo: The patient is from: Home  Anticipated d/c is to: Home  Patient currently is not medically stable to d/c.              Difficult to place patient No   Consultants:   Cardiology  Procedures:   See below  Antimicrobials:   None   Subjective: Patient seen and evaluated today with no new acute complaints or concerns. No acute concerns or events noted overnight.  He does not appear to have any significant edema noted this morning.  Objective: Vitals:   10/22/20 1457 10/22/20 2151 10/23/20 0500 10/23/20 0535  BP: 116/85 91/65  108/61  Pulse: (!) 101 84  80  Resp: 20 20  20   Temp: (!) 97.5 F (36.4 C) 98.7 F (37.1 C)  98 F (36.7 C)  TempSrc: Oral     SpO2: 96% 92%  91%  Weight:   94.9 kg   Height:        Intake/Output Summary (Last 24 hours) at 10/23/2020 1031 Last data filed at 10/22/2020 1824 Gross per 24 hour  Intake 720 ml  Output 350 ml  Net 370 ml   Filed Weights   10/21/20 1353 10/22/20 0600 10/23/20 0500  Weight: 95.2 kg 95.8 kg 94.9 kg    Examination:  General exam: Appears calm and comfortable  Respiratory system: Clear to auscultation. Respiratory effort normal. Cardiovascular  system: S1 & S2 heard, RRR.  Gastrointestinal system: Abdomen is soft Central nervous system: Alert and awake Extremities: No edema Skin: No significant lesions noted Psychiatry: Flat affect.    Data Reviewed: I have personally reviewed following labs and imaging studies  CBC: Recent Labs  Lab  10/21/20 0033 10/21/20 0404 10/22/20 0434  WBC 5.6 6.4 5.6  NEUTROABS 4.0  --   --   HGB 12.4* 12.7* 11.7*  HCT 42.0 42.6 38.2*  MCV 93.1 93.6 91.2  PLT 431* 435* 390   Basic Metabolic Panel: Recent Labs  Lab 10/21/20 0033 10/21/20 0404 10/22/20 0434 10/23/20 0503  NA 137 138 136 137  K 3.8 4.3 3.6 4.2  CL 103 103 99 100  CO2 26 27 28 28   GLUCOSE 126* 104* 110* 80  BUN 20 20 22* 22*  CREATININE 1.09 1.05 0.92 1.01  CALCIUM 8.2* 8.5* 8.3* 8.8*  MG  --  2.1 1.9 2.0  PHOS  --  4.1  --   --    GFR: Estimated Creatinine Clearance: 88.4 mL/min (by C-G formula based on SCr of 1.01 mg/dL). Liver Function Tests: Recent Labs  Lab 10/21/20 0033 10/21/20 0404 10/22/20 0434 10/23/20 0503  AST 91* 86* 54* 44*  ALT 173* 167* 119* 109*  ALKPHOS 81 83 77 88  BILITOT 1.0 1.0 0.9 1.1  PROT 6.4* 6.4* 5.6* 6.5  ALBUMIN 3.3* 3.4* 3.0* 3.4*   No results for input(s): LIPASE, AMYLASE in the last 168 hours. Recent Labs  Lab 10/21/20 2114  AMMONIA 12   Coagulation Profile: Recent Labs  Lab 10/21/20 0404  INR 1.3*   Cardiac Enzymes: No results for input(s): CKTOTAL, CKMB, CKMBINDEX, TROPONINI in the last 168 hours. BNP (last 3 results) No results for input(s): PROBNP in the last 8760 hours. HbA1C: No results for input(s): HGBA1C in the last 72 hours. CBG: No results for input(s): GLUCAP in the last 168 hours. Lipid Profile: No results for input(s): CHOL, HDL, LDLCALC, TRIG, CHOLHDL, LDLDIRECT in the last 72 hours. Thyroid Function Tests: Recent Labs    10/23/20 0503  TSH 3.217   Anemia Panel: No results for input(s): VITAMINB12, FOLATE, FERRITIN, TIBC, IRON, RETICCTPCT in the last 72 hours. Sepsis Labs: No results for input(s): PROCALCITON, LATICACIDVEN in the last 168 hours.  Recent Results (from the past 240 hour(s))  SARS CORONAVIRUS 2 (TAT 6-24 HRS) Nasopharyngeal Nasopharyngeal Swab     Status: None   Collection Time: 10/21/20  2:00 AM   Specimen:  Nasopharyngeal Swab  Result Value Ref Range Status   SARS Coronavirus 2 NEGATIVE NEGATIVE Final    Comment: (NOTE) SARS-CoV-2 target nucleic acids are NOT DETECTED.  The SARS-CoV-2 RNA is generally detectable in upper and lower respiratory specimens during the acute phase of infection. Negative results do not preclude SARS-CoV-2 infection, do not rule out co-infections with other pathogens, and should not be used as the sole basis for treatment or other patient management decisions. Negative results must be combined with clinical observations, patient history, and epidemiological information. The expected result is Negative.  Fact Sheet for Patients: 10/23/20  Fact Sheet for Healthcare Providers: HairSlick.no  This test is not yet approved or cleared by the quierodirigir.com FDA and  has been authorized for detection and/or diagnosis of SARS-CoV-2 by FDA under an Emergency Use Authorization (EUA). This EUA will remain  in effect (meaning this test can be used) for the duration of the COVID-19 declaration under Se ction 564(b)(1) of the Act, 21 U.S.C.  section 360bbb-3(b)(1), unless the authorization is terminated or revoked sooner.  Performed at [facility], [address], [place], [place] [address]          Radiology Studies: ECHOCARDIOGRAM COMPLETE  Result Date: 10/21/2020    ECHOCARDIOGRAM REPORT   Patient Name:   Kyle Peck Date of Exam: 10/21/2020 Medical Rec #:  [id number]    Height:       69.0 in Accession #:    [id number]   Weight:       200.0 lb Date of Birth:  03/20/1961    BSA:          2.066 m Patient Age:    60 years     BP:           129/87 mmHg Patient Gender: M            HR:           101 bpm. [facility]am [facility] Procedure: 2D Echo Indications:    Congestive Heart Failure I50.9  History:        Patient has no prior history of Echocardiogram examinations.                 CHF; Risk  Factors:Current Smoker. Volume overload with elevated                 BNP, ETOH, Not been to doctor in 20 years per patient.  Sonographer:    Jeryl Columbia RDCS (AE) Referring Phys: [phone] [facility] IMPRESSIONS  1. Left ventricular ejection fraction, by estimation, is 15%. The left ventricle has severely decreased function. The left ventricle demonstrates global hypokinesis. The left ventricular internal cavity size was moderately dilated. Left ventricular diastolic parameters are indeterminate.  2. Right ventricular systolic function is mildly reduced. The right ventricular size is moderately enlarged.  3. Left atrial size was severely dilated.  4. Right atrial size was mildly dilated.  5. The mitral valve is normal in structure. Mild mitral valve regurgitation. No evidence of mitral stenosis.  6. The aortic valve has an indeterminant number of cusps. There is mild calcification of the aortic valve. There is mild thickening of the aortic valve. Aortic valve regurgitation is not visualized. No aortic stenosis is present.  7. The inferior vena cava is dilated in size with <50% respiratory variability, suggesting right atrial pressure of 15 mmHg. FINDINGS  Left Ventricle: Left ventricular ejection fraction, by estimation, is 15%. The left ventricle has severely decreased function. The left ventricle demonstrates global hypokinesis. The left ventricular internal cavity size was moderately dilated. There is  no left ventricular hypertrophy. Left ventricular diastolic parameters are indeterminate. Right Ventricle: The right ventricular size is moderately enlarged. Right vetricular wall thickness was not assessed. Right ventricular systolic function is mildly reduced. Left Atrium: Left atrial size was severely dilated. Right Atrium: Right atrial size was mildly dilated. Pericardium: There is no evidence of pericardial effusion. Mitral Valve: The mitral valve is normal in structure. Mild mitral valve regurgitation.  No evidence of mitral valve stenosis. Tricuspid Valve: The tricuspid valve is not well visualized. Tricuspid valve regurgitation is not demonstrated. Aortic Valve: The aortic valve has an indeterminant number of cusps. There is mild calcification of the aortic valve. There is mild thickening of the aortic valve. There is mild aortic valve annular calcification. Aortic valve regurgitation is not visualized. No aortic stenosis is present. Aortic valve mean gradient measures 1.4 mmHg. Aortic valve peak gradient measures 2.8 mmHg. Aortic  valve area, by VTI measures 2.79 cm. Pulmonic Valve: The pulmonic valve was not well visualized. Pulmonic valve regurgitation is not visualized. No evidence of pulmonic stenosis. Aorta: The aortic root is normal in size and structure. Pulmonary Artery: Indeterminant PASP, inadequate TR jet. Venous: The inferior vena cava is dilated in size with less than 50% respiratory variability, suggesting right atrial pressure of 15 mmHg. IAS/Shunts: No atrial level shunt detected by color flow Doppler.  LEFT VENTRICLE PLAX 2D LVIDd:         6.49 cm  Diastology LVIDs:         5.57 cm  LV e' medial:    7.80 cm/s LV PW:         0.90 cm  LV E/e' medial:  11.5 LV IVS:        0.81 cm  LV e' lateral:   5.87 cm/s LVOT diam:     2.10 cm  LV E/e' lateral: 15.3 LV SV:         35 LV SV Index:   17 LVOT Area:     3.46 cm  RIGHT VENTRICLE RV S prime:     8.67 cm/s TAPSE (M-mode): 2.0 cm LEFT ATRIUM              Index       RIGHT ATRIUM           Index LA diam:        6.20 cm  3.00 cm/m  RA Area:     21.00 cm LA Vol (A2C):   110.0 ml 53.24 ml/m RA Volume:   58.40 ml  28.27 ml/m LA Vol (A4C):   103.0 ml 49.85 ml/m LA Biplane Vol: 112.0 ml 54.21 ml/m  AORTIC VALVE AV Area (Vmax):    2.44 cm AV Area (Vmean):   2.22 cm AV Area (VTI):     2.79 cm AV Vmax:           83.48 cm/s AV Vmean:          53.395 cm/s AV VTI:            0.124 m AV Peak Grad:      2.8 mmHg AV Mean Grad:      1.4 mmHg LVOT Vmax:          58.76 cm/s LVOT Vmean:        34.163 cm/s LVOT VTI:          0.100 m LVOT/AV VTI ratio: 0.80  AORTA Ao Root diam: 3.50 cm MITRAL VALVE MV Area (PHT): 3.15 cm    SHUNTS MV Decel Time: 241 msec    Systemic VTI:  0.10 m MV E velocity: 89.60 cm/s  Systemic Diam: 2.10 cm MV A velocity: 36.80 cm/s MV E/A ratio:  2.43 Dina RichJonathan Branch MD Electronically signed by Dina RichJonathan Branch MD Signature Date/Time: 10/21/2020/3:25:28 PM    Final         Scheduled Meds: . enoxaparin (LOVENOX) injection  40 mg Subcutaneous Q24H  . feeding supplement  237 mL Oral BID BM  . folic acid  1 mg Oral Daily  . losartan  12.5 mg Oral Daily  . multivitamin with minerals  1 tablet Oral Daily  . thiamine  100 mg Oral Daily   Or  . thiamine  100 mg Intravenous Daily    LOS: 2 days    Time spent: 35 minutes    Sherryll Skoczylas Hoover Brunette Ambree Frances, DO [facility]  If 7PM-7AM, please contact night-coverage [email or web address] 10/23/2020,  10:31 AM

## 2020-10-24 ENCOUNTER — Inpatient Hospital Stay (HOSPITAL_COMMUNITY): Payer: Self-pay

## 2020-10-24 LAB — COMPREHENSIVE METABOLIC PANEL
ALT: 87 U/L — ABNORMAL HIGH (ref 0–44)
AST: 37 U/L (ref 15–41)
Albumin: 3.4 g/dL — ABNORMAL LOW (ref 3.5–5.0)
Alkaline Phosphatase: 95 U/L (ref 38–126)
Anion gap: 8 (ref 5–15)
BUN: 20 mg/dL (ref 6–20)
CO2: 28 mmol/L (ref 22–32)
Calcium: 8.9 mg/dL (ref 8.9–10.3)
Chloride: 103 mmol/L (ref 98–111)
Creatinine, Ser: 0.91 mg/dL (ref 0.61–1.24)
GFR, Estimated: >60 mL/min (ref >60)
Glucose, Bld: 110 mg/dL — ABNORMAL HIGH (ref 70–99)
Potassium: 4.5 mmol/L (ref 3.5–5.1)
Sodium: 139 mmol/L (ref 135–145)
Total Bilirubin: 0.7 mg/dL (ref 0.3–1.2)
Total Protein: 6.5 g/dL (ref 6.5–8.1)

## 2020-10-24 LAB — BLOOD GAS, ARTERIAL
Acid-Base Excess: 0.6 mmol/L (ref 0.0–2.0)
Bicarbonate: 24.9 mmol/L (ref 20.0–28.0)
FIO2: 60
O2 Saturation: 98.5 %
Patient temperature: 37
pCO2 arterial: 41.3 mmHg (ref 32.0–48.0)
pH, Arterial: 7.397 (ref 7.350–7.450)
pO2, Arterial: 115 mmHg — ABNORMAL HIGH (ref 83.0–108.0)

## 2020-10-24 LAB — GLUCOSE, CAPILLARY: Glucose-Capillary: 144 mg/dL — ABNORMAL HIGH (ref 70–99)

## 2020-10-24 LAB — MAGNESIUM: Magnesium: 1.8 mg/dL (ref 1.7–2.4)

## 2020-10-24 MED ORDER — ETOMIDATE 2 MG/ML IV SOLN
10.0000 mg | Freq: Once | INTRAVENOUS | Status: AC
  Administered 2020-10-24: 10 mg via INTRAVENOUS

## 2020-10-24 MED ORDER — NOREPINEPHRINE 4 MG/250ML-% IV SOLN: 0.0000 ug/min | INTRAVENOUS | Status: DC

## 2020-10-24 MED ORDER — DEXMEDETOMIDINE HCL IN NACL 400 MCG/100ML IV SOLN
0.4000 ug/kg/h | INTRAVENOUS | Status: DC
  Administered 2020-10-24: 0.4 ug/kg/h via INTRAVENOUS
  Administered 2020-10-24: 1.4 ug/kg/h via INTRAVENOUS
  Administered 2020-10-25: 1.1 ug/kg/h via INTRAVENOUS
  Administered 2020-10-28: 0.4 ug/kg/h via INTRAVENOUS
  Administered 2020-10-28: 0.8 ug/kg/h via INTRAVENOUS
  Administered 2020-10-28: 0.6 ug/kg/h via INTRAVENOUS
  Administered 2020-10-28: 1 ug/kg/h via INTRAVENOUS
  Administered 2020-10-29: 0.6 ug/kg/h via INTRAVENOUS
  Administered 2020-10-29 (×3): 1 ug/kg/h via INTRAVENOUS
  Administered 2020-10-30 (×2): 1.5 ug/kg/h via INTRAVENOUS
  Administered 2020-10-30: 0.9 ug/kg/h via INTRAVENOUS
  Administered 2020-10-30 – 2020-10-31 (×3): 1.5 ug/kg/h via INTRAVENOUS
  Administered 2020-10-31: 0.4 ug/kg/h via INTRAVENOUS
  Administered 2020-10-31 (×2): 1.5 ug/kg/h via INTRAVENOUS
  Administered 2020-11-01: 0.4 ug/kg/h via INTRAVENOUS
  Filled 2020-10-24 (×7): qty 100
  Filled 2020-10-24: qty 200
  Filled 2020-10-24 (×14): qty 100

## 2020-10-24 MED ORDER — NOREPINEPHRINE 4 MG/250ML-% IV SOLN
2.0000 ug/min | INTRAVENOUS | Status: DC
  Administered 2020-10-24: 2 ug/min via INTRAVENOUS
  Administered 2020-10-25: 10 ug/min via INTRAVENOUS
  Filled 2020-10-24 (×3): qty 250

## 2020-10-24 MED ORDER — LORAZEPAM 1 MG PO TABS
1.0000 mg | ORAL_TABLET | ORAL | Status: DC | PRN
Start: 2020-10-24 — End: 2020-10-25

## 2020-10-24 MED ORDER — ZIPRASIDONE MESYLATE 20 MG IM SOLR
20.0000 mg | Freq: Once | INTRAMUSCULAR | Status: AC
  Administered 2020-10-24: 20 mg via INTRAMUSCULAR
  Filled 2020-10-24 (×2): qty 20

## 2020-10-24 MED ORDER — CHLORHEXIDINE GLUCONATE CLOTH 2 % EX PADS
6.0000 | MEDICATED_PAD | Freq: Every day | CUTANEOUS | Status: DC
  Administered 2020-10-24 – 2020-10-25 (×2): 6 via TOPICAL

## 2020-10-24 MED ORDER — LORAZEPAM 2 MG/ML IJ SOLN
1.0000 mg | INTRAMUSCULAR | Status: DC | PRN
  Administered 2020-10-24 (×2): 3 mg via INTRAVENOUS
  Administered 2020-10-24: 4 mg via INTRAVENOUS
  Filled 2020-10-24 (×3): qty 2

## 2020-10-24 MED ORDER — FUROSEMIDE 10 MG/ML IJ SOLN: 40.0000 mg | Freq: Two times a day (BID) | INTRAMUSCULAR | Status: DC

## 2020-10-24 MED ORDER — SUCCINYLCHOLINE CHLORIDE 20 MG/ML IJ SOLN
120.0000 mg | Freq: Once | INTRAMUSCULAR | Status: AC
  Administered 2020-10-24: 120 mg via INTRAVENOUS

## 2020-10-24 MED ORDER — SODIUM CHLORIDE 0.9 % IV SOLN
250.0000 mL | INTRAVENOUS | Status: DC
  Administered 2020-10-31: 250 mL via INTRAVENOUS

## 2020-10-24 MED ORDER — DEXMEDETOMIDINE HCL IN NACL 200 MCG/50ML IV SOLN
0.4000 ug/kg/h | INTRAVENOUS | Status: AC
  Administered 2020-10-24: 0.4 ug/kg/h via INTRAVENOUS
  Filled 2020-10-24: qty 50

## 2020-10-24 MED ORDER — CHLORHEXIDINE GLUCONATE 0.12% ORAL RINSE (MEDLINE KIT)
15.0000 mL | Freq: Two times a day (BID) | OROMUCOSAL | Status: DC
  Administered 2020-10-24 – 2020-10-31 (×13): 15 mL via OROMUCOSAL

## 2020-10-24 MED ORDER — ORAL CARE MOUTH RINSE
15.0000 mL | OROMUCOSAL | Status: DC
  Administered 2020-10-24 – 2020-10-31 (×62): 15 mL via OROMUCOSAL

## 2020-10-24 MED ORDER — SODIUM CHLORIDE 0.9 % IV BOLUS
500.0000 mL | Freq: Once | INTRAVENOUS | Status: AC
  Administered 2020-10-24: 500 mL via INTRAVENOUS

## 2020-10-24 MED ORDER — SODIUM CHLORIDE 0.9 % IV BOLUS
250.0000 mL | Freq: Once | INTRAVENOUS | Status: AC
  Administered 2020-10-24: 250 mL via INTRAVENOUS

## 2020-10-24 MED ORDER — MIDAZOLAM 50MG/50ML (1MG/ML) PREMIX INFUSION
0.5000 mg/h | INTRAVENOUS | Status: DC
  Administered 2020-10-24: 0.5 mg/h via INTRAVENOUS
  Administered 2020-10-24 – 2020-10-25 (×2): 10 mg/h via INTRAVENOUS
  Administered 2020-10-26: 4 mg/h via INTRAVENOUS
  Filled 2020-10-24 (×6): qty 50

## 2020-10-24 MED ORDER — HALOPERIDOL LACTATE 5 MG/ML IJ SOLN
2.0000 mg | Freq: Four times a day (QID) | INTRAMUSCULAR | Status: DC | PRN
  Administered 2020-10-24 – 2020-11-03 (×6): 2 mg via INTRAVENOUS
  Filled 2020-10-24 (×6): qty 1

## 2020-10-24 NOTE — Progress Notes (Signed)
MD notified of patient's agitation and intolerance to ventilator. Awaiting orders/response at this time.

## 2020-10-24 NOTE — ED Provider Notes (Signed)
I was called to the patient's bed to perform an intubation because he has become very lethargic and having episodes of apnea.  When I arrived the patient was only responding to painful stimuli.  Patient was given 10 mg of etomidate and 120 mg of succinylcholine.  He was intubated using the glide a scope with a 7-1/2 tube.  It took 2 tries to intubate him but it went well.  Position was confirmed with auscultation and CO2 determination   Bethann Berkshire, MD 10/24/20 1720

## 2020-10-24 NOTE — Progress Notes (Signed)
[employer] alarm sounded at nurses' desk and staff walked towards the door. The door was closed, and when staff walked back down hallway to account for all patients, Kyle Peck was discovered missing. Yehuda Mao, NT, ran down the [employer] while myself and [place], LPN ran down stariwell 1. Patient was found on the second level of the stairwell. Security was paged as well as [employer], RN/AC. Patient was eventually coaxed into a wheelchair and brought back to floor. VS were obtained, assessment completed and Dr. Sherryll Burger made aware and at bedside to asses patient. CIWA score completed. Daughter, Elmarie Shiley, and wife, Eber Jones, made aware and told Omar Person, primary nurse, that they were on their way to the hospital to sit with patient. Bed alarm on, will continue to monitor.

## 2020-10-24 NOTE — Progress Notes (Signed)
Pt's SBP noted to be running in the 70's with map 50-60. Dr. Sherryll Burger made aware. 250 cc bolus given with minimal improvement. 500 cc bolus started, additional new orders received and endorsed to oncoming RN

## 2020-10-24 NOTE — Progress Notes (Signed)
PROGRESS NOTE    Kyle Peck  BDZ:[id number] DOB: December 15, 1960 DOA: 10/20/2020 PCP: Pcp, No   Brief Narrative:   Kyle Belcheris a 60 y.o.malewith medical history significant fortobacco use and occasional alcohol use who presents to the emergency department due to several days of leg and abdominal swelling.He was thought to potentially have new-onsetCHF and has been started on IV Lasix twice daily for diuresis. 2D echocardiogramdemonstrates LVEF of 15% with global hypokinesis. Cardiology recommending cardiac catheterization after adequate diuresis.  He does not appear to be significantly volume overloaded and blood pressure is lower, therefore diuresis will be discontinued.  He is continuing to have some agitation for which she will need to be transferred to stepdown unit with Precedex drip.   Assessment & Plan:   Principal Problem:   Elevated brain natriuretic peptide (BNP) level Active Problems:   CHF (congestive heart failure) (HCC)   Transaminitis   Thrombocytosis   Hypoalbuminemia due to protein-calorie malnutrition (HCC)   Tobacco use   Acute systolic CHF decompensation in the setting of severe cardiomyopathy -Likely related to alcohol and polysubstance abuse with prior cocaine use -LVEF 15% -Appreciate cardiology evaluation with TSH 3.2, plans for cardiac catheterization once stabilized -Hold further diuresis as he appears to be euvolemic and has soft blood pressure readings -Monitor daily weights and strict I's and O's  Alcohol withdrawal -Transfer to stepdown unit on Precedex drip  Transaminitis-downtrending -Related to EtOh use -Recheck labs in a.m. -Right upper quadrant ultrasound with findings of ascites and possible cirrhosis -Hepatitis panel nonreactive -Continue to monitor a.m. labs  Hypoalbuminemia -Likely related to mild protein calorie malnutrition  History of tobacco abuse -Counseled on cessation  History of polysubstance abuse -Noted to  be cocaine positive   DVT prophylaxis:Lovenox Code Status:Full Family Communication:Discussed with daughter and wife at bedside 5/22 Disposition Plan: Status is: Inpatient  Remains inpatient appropriate because:Ongoing diagnostic testing needed not appropriate for outpatient work up, IV treatments appropriate due to intensity of illness or inability to take PO and Inpatient level of care appropriate due to severity of illness   Dispo: The patient is from:Home Anticipated d/c is TM:HDQQ Patient currently is not medically stable to d/c. Difficult to place patient No   Consultants:  Cardiology  Procedures:  See below  Antimicrobials:  None  Subjective: Patient seen and evaluated today with worsening agitation and confusion noted.  No acute overnight events.  Objective: Vitals:   10/24/20 0539 10/24/20 0601 10/24/20 1027 10/24/20 1205  BP: 110/81  99/78 (!) 141/97  Pulse: (!) 110  (!) 109 (!) 111  Resp: 20  (!) 24 (!) 30  Temp: 98.7 F (37.1 C)  (!) 97.5 F (36.4 C)   TempSrc:   Oral   SpO2: 94%  96% 94%  Weight:  90.3 kg    Height:        Intake/Output Summary (Last 24 hours) at 10/24/2020 1231 Last data filed at 10/24/2020 0900 Gross per 24 hour  Intake 120 ml  Output 550 ml  Net -430 ml   Filed Weights   10/22/20 0600 10/23/20 0500 10/24/20 0601  Weight: 95.8 kg 94.9 kg 90.3 kg    Examination:  General exam: Appears agitated and confused Respiratory system: Clear to auscultation. Respiratory effort normal. Cardiovascular system: S1 & S2 heard, RRR.  Gastrointestinal system: Abdomen is soft Central nervous system: Alert and awake Extremities: No edema Skin: No significant lesions noted Psychiatry: Agitated as noted above    Data Reviewed: I have personally reviewed following labs  and imaging studies  CBC: Recent Labs  Lab 10/21/20 0033 10/21/20 0404 10/22/20 0434  WBC 5.6 6.4 5.6   NEUTROABS 4.0  --   --   HGB 12.4* 12.7* 11.7*  HCT 42.0 42.6 38.2*  MCV 93.1 93.6 91.2  PLT 431* 435* 390   Basic Metabolic Panel: Recent Labs  Lab 10/21/20 0033 10/21/20 0404 10/22/20 0434 10/23/20 0503 10/24/20 0545  NA 137 138 136 137 139  K 3.8 4.3 3.6 4.2 4.5  CL 103 103 99 100 103  CO2 26 27 28 28 28   GLUCOSE 126* 104* 110* 80 110*  BUN 20 20 22* 22* 20  CREATININE 1.09 1.05 0.92 1.01 0.91  CALCIUM 8.2* 8.5* 8.3* 8.8* 8.9  MG  --  2.1 1.9 2.0 1.8  PHOS  --  4.1  --   --   --    GFR: Estimated Creatinine Clearance: 95.8 mL/min (by C-G formula based on SCr of 0.91 mg/dL). Liver Function Tests: Recent Labs  Lab 10/21/20 0033 10/21/20 0404 10/22/20 0434 10/23/20 0503 10/24/20 0545  AST 91* 86* 54* 44* 37  ALT 173* 167* 119* 109* 87*  ALKPHOS 81 83 77 88 95  BILITOT 1.0 1.0 0.9 1.1 0.7  PROT 6.4* 6.4* 5.6* 6.5 6.5  ALBUMIN 3.3* 3.4* 3.0* 3.4* 3.4*   No results for input(s): LIPASE, AMYLASE in the last 168 hours. Recent Labs  Lab 10/21/20 2114  AMMONIA 12   Coagulation Profile: Recent Labs  Lab 10/21/20 0404  INR 1.3*   Cardiac Enzymes: No results for input(s): CKTOTAL, CKMB, CKMBINDEX, TROPONINI in the last 168 hours. BNP (last 3 results) No results for input(s): PROBNP in the last 8760 hours. HbA1C: No results for input(s): HGBA1C in the last 72 hours. CBG: No results for input(s): GLUCAP in the last 168 hours. Lipid Profile: No results for input(s): CHOL, HDL, LDLCALC, TRIG, CHOLHDL, LDLDIRECT in the last 72 hours. Thyroid Function Tests: Recent Labs    10/23/20 0503  TSH 3.217   Anemia Panel: No results for input(s): VITAMINB12, FOLATE, FERRITIN, TIBC, IRON, RETICCTPCT in the last 72 hours. Sepsis Labs: No results for input(s): PROCALCITON, LATICACIDVEN in the last 168 hours.  Recent Results (from the past 240 hour(s))  SARS CORONAVIRUS 2 (TAT 6-24 HRS) Nasopharyngeal Nasopharyngeal Swab     Status: None   Collection Time: 10/21/20   2:00 AM   Specimen: Nasopharyngeal Swab  Result Value Ref Range Status   SARS Coronavirus 2 NEGATIVE NEGATIVE Final    Comment: (NOTE) SARS-CoV-2 target nucleic acids are NOT DETECTED.  The SARS-CoV-2 RNA is generally detectable in upper and lower respiratory specimens during the acute phase of infection. Negative results do not preclude SARS-CoV-2 infection, do not rule out co-infections with other pathogens, and should not be used as the sole basis for treatment or other patient management decisions. Negative results must be combined with clinical observations, patient history, and epidemiological information. The expected result is Negative.  Fact Sheet for Patients: 10/23/20  Fact Sheet for Healthcare Providers: HairSlick.no  This test is not yet approved or cleared by the quierodirigir.com FDA and  has been authorized for detection and/or diagnosis of SARS-CoV-2 by FDA under an Emergency Use Authorization (EUA). This EUA will remain  in effect (meaning this test can be used) for the duration of the COVID-19 declaration under Se ction 564(b)(1) of the Act, 21 U.S.C. section 360bbb-3(b)(1), unless the authorization is terminated or revoked sooner.  Performed at [facility], [address] [place], [place] [address]          Radiology Studies: No results found.      Scheduled Meds: . enoxaparin (LOVENOX) injection  40 mg Subcutaneous Q24H  . feeding supplement  237 mL Oral BID BM  . folic acid  1 mg Oral Daily  . losartan  12.5 mg Oral Daily  . multivitamin with minerals  1 tablet Oral Daily  . thiamine  100 mg Oral Daily   Or  . thiamine  100 mg Intravenous Daily   Continuous Infusions: . dexmedetomidine (PRECEDEX) IV infusion       LOS: 3 days    Time spent: 35 minutes    Juanita Devincent Hoover Brunette, DO [facility]  If 7PM-7AM, please contact night-coverage [email or web address] 10/24/2020,  12:31 PM

## 2020-10-24 NOTE — Progress Notes (Signed)
Pt noted to be breathing at a rate of 40+ breaths/min, with extended periods of apnea noted. Pt proceeded to become increasingly lethargic and only arrousible to painful stimuli. Hospitalist to bedside, orders placed for intubation. ED physician to bedside, 120  Mg IVP of succinylcholine given at 1632, etomidate 10 mg IVP given at 1634. OG placed, breath sounds equal. Placement confirmed via CXR

## 2020-10-24 NOTE — Progress Notes (Signed)
   10/24/20 1205  Assess: MEWS Score  BP (!) 141/97  Pulse Rate (!) 111  Resp (!) 30  Level of Consciousness Alert  SpO2 94 %  O2 Device Room Air  Assess: MEWS Score  MEWS Temp 0  MEWS Systolic 0  MEWS Pulse 2  MEWS RR 2  MEWS LOC 0  MEWS Score 4  MEWS Score Color Red  Assess: if the MEWS score is Yellow or Red  Were vital signs taken at a resting state? Yes  Focused Assessment Change from prior assessment (see assessment flowsheet)  Early Detection of Sepsis Score *See Row Information* Low  MEWS guidelines implemented *See Row Information* Yes  Take Vital Signs  Increase Vital Sign Frequency  Red: Q 1hr X 4 then Q 4hr X 4, if remains red, continue Q 4hrs  Escalate  MEWS: Escalate Red: discuss with charge nurse/RN and provider, consider discussing with RRT  Notify: Charge Nurse/RN  Name of Charge Nurse/RN Notified Elease Hashimoto RN  Date Charge Nurse/RN Notified 10/24/20  Time Charge Nurse/RN Notified 1205  Notify: Provider  Provider Name/Title Dr Sherryll Burger  Date Provider Notified 10/24/20  Time Provider Notified 1207  Notification Type Face-to-face  Notification Reason Change in status;Other (Comment) (red mews)  Provider response At bedside;See new orders  Date of Provider Response 10/24/20  Time of Provider Response 1208  Document  Patient Outcome Transferred/level of care increased

## 2020-10-24 NOTE — Progress Notes (Signed)
   10/24/20 1027  Assess: MEWS Score  Temp (!) 97.5 F (36.4 C)  BP 99/78  Pulse Rate (!) 109  Resp (!) 24  Level of Consciousness Alert  SpO2 96 %  O2 Device Room Air  Assess: MEWS Score  MEWS Temp 0  MEWS Systolic 1  MEWS Pulse 1  MEWS RR 1  MEWS LOC 0  MEWS Score 3  MEWS Score Color Yellow  Assess: if the MEWS score is Yellow or Red  Were vital signs taken at a resting state? Yes  Focused Assessment No change from prior assessment  Early Detection of Sepsis Score *See Row Information* Low  MEWS guidelines implemented *See Row Information* Yes  Treat  Pain Scale 0-10  Pain Score 0  Take Vital Signs  Increase Vital Sign Frequency  Yellow: Q 2hr X 2 then Q 4hr X 2, if remains yellow, continue Q 4hrs  Escalate  MEWS: Escalate Yellow: discuss with charge nurse/RN and consider discussing with provider and RRT  Notify: Charge Nurse/RN  Name of Charge Nurse/RN Notified katie LPN (discussed with primary nurse)  Date Charge Nurse/RN Notified 10/24/20  Time Charge Nurse/RN Notified 1052  Notify: Provider  Provider Name/Title Dr. Sherryll Burger  Date Provider Notified 10/24/20  Time Provider Notified 1040  Notification Type Face-to-face  Notification Reason Other (Comment) (yellow MEWS)  Provider response At bedside  Date of Provider Response 10/24/20  Time of Provider Response 1040

## 2020-10-25 ENCOUNTER — Inpatient Hospital Stay (HOSPITAL_COMMUNITY): Payer: Self-pay

## 2020-10-25 ENCOUNTER — Inpatient Hospital Stay: Payer: Self-pay

## 2020-10-25 DIAGNOSIS — J9601 Acute respiratory failure with hypoxia: Secondary | ICD-10-CM

## 2020-10-25 DIAGNOSIS — R570 Cardiogenic shock: Secondary | ICD-10-CM

## 2020-10-25 DIAGNOSIS — L8990 Pressure ulcer of unspecified site, unspecified stage: Secondary | ICD-10-CM | POA: Insufficient documentation

## 2020-10-25 DIAGNOSIS — N179 Acute kidney failure, unspecified: Secondary | ICD-10-CM

## 2020-10-25 LAB — BASIC METABOLIC PANEL
Anion gap: 10 (ref 5–15)
Anion gap: 8 (ref 5–15)
Anion gap: 10 (ref 5–15)
BUN: 26 mg/dL — ABNORMAL HIGH (ref 6–20)
BUN: 28 mg/dL — ABNORMAL HIGH (ref 6–20)
BUN: 31 mg/dL — ABNORMAL HIGH (ref 6–20)
CO2: 24 mmol/L (ref 22–32)
CO2: 22 mmol/L (ref 22–32)
CO2: 23 mmol/L (ref 22–32)
Calcium: 8.3 mg/dL — ABNORMAL LOW (ref 8.9–10.3)
Calcium: 8 mg/dL — ABNORMAL LOW (ref 8.9–10.3)
Calcium: 8 mg/dL — ABNORMAL LOW (ref 8.9–10.3)
Chloride: 101 mmol/L (ref 98–111)
Chloride: 104 mmol/L (ref 98–111)
Chloride: 103 mmol/L (ref 98–111)
Creatinine, Ser: 2.07 mg/dL — ABNORMAL HIGH (ref 0.61–1.24)
Creatinine, Ser: 1.83 mg/dL — ABNORMAL HIGH (ref 0.61–1.24)
Creatinine, Ser: 2.09 mg/dL — ABNORMAL HIGH (ref 0.61–1.24)
GFR, Estimated: 36 mL/min — ABNORMAL LOW (ref >60)
GFR, Estimated: 42 mL/min — ABNORMAL LOW (ref >60)
GFR, Estimated: 36 mL/min — ABNORMAL LOW (ref >60)
Glucose, Bld: 126 mg/dL — ABNORMAL HIGH (ref 70–99)
Glucose, Bld: 117 mg/dL — ABNORMAL HIGH (ref 70–99)
Glucose, Bld: 152 mg/dL — ABNORMAL HIGH (ref 70–99)
Potassium: 6.9 mmol/L (ref 3.5–5.1)
Potassium: 5.5 mmol/L — ABNORMAL HIGH (ref 3.5–5.1)
Potassium: 6 mmol/L — ABNORMAL HIGH (ref 3.5–5.1)
Sodium: 135 mmol/L (ref 135–145)
Sodium: 134 mmol/L — ABNORMAL LOW (ref 135–145)
Sodium: 136 mmol/L (ref 135–145)

## 2020-10-25 LAB — COMPREHENSIVE METABOLIC PANEL
ALT: 97 U/L — ABNORMAL HIGH (ref 0–44)
AST: 81 U/L — ABNORMAL HIGH (ref 15–41)
Albumin: 3.5 g/dL (ref 3.5–5.0)
Alkaline Phosphatase: 81 U/L (ref 38–126)
Anion gap: 12 (ref 5–15)
BUN: 26 mg/dL — ABNORMAL HIGH (ref 6–20)
CO2: 24 mmol/L (ref 22–32)
Calcium: 8.4 mg/dL — ABNORMAL LOW (ref 8.9–10.3)
Chloride: 101 mmol/L (ref 98–111)
Creatinine, Ser: 1.82 mg/dL — ABNORMAL HIGH (ref 0.61–1.24)
GFR, Estimated: 42 mL/min — ABNORMAL LOW (ref >60)
Glucose, Bld: 144 mg/dL — ABNORMAL HIGH (ref 70–99)
Potassium: 6.8 mmol/L (ref 3.5–5.1)
Sodium: 137 mmol/L (ref 135–145)
Total Bilirubin: 2.2 mg/dL — ABNORMAL HIGH (ref 0.3–1.2)
Total Protein: 6.8 g/dL (ref 6.5–8.1)

## 2020-10-25 LAB — COOXEMETRY PANEL
Carboxyhemoglobin: 0.8 % (ref 0.5–1.5)
Carboxyhemoglobin: 1.3 % (ref 0.5–1.5)
Methemoglobin: 0.9 % (ref 0.0–1.5)
Methemoglobin: 0.7 % (ref 0.0–1.5)
O2 Saturation: 51.3 %
O2 Saturation: 81.1 %
Total hemoglobin: 14.8 g/dL (ref 12.0–16.0)
Total hemoglobin: 12.9 g/dL (ref 12.0–16.0)

## 2020-10-25 LAB — GLUCOSE, CAPILLARY
Glucose-Capillary: 120 mg/dL — ABNORMAL HIGH (ref 70–99)
Glucose-Capillary: 133 mg/dL — ABNORMAL HIGH (ref 70–99)
Glucose-Capillary: 139 mg/dL — ABNORMAL HIGH (ref 70–99)
Glucose-Capillary: 151 mg/dL — ABNORMAL HIGH (ref 70–99)
Glucose-Capillary: 158 mg/dL — ABNORMAL HIGH (ref 70–99)
Glucose-Capillary: 163 mg/dL — ABNORMAL HIGH (ref 70–99)
Glucose-Capillary: 168 mg/dL — ABNORMAL HIGH (ref 70–99)

## 2020-10-25 LAB — CBC
HCT: 46.8 % (ref 39.0–52.0)
Hemoglobin: 13.8 g/dL (ref 13.0–17.0)
MCH: 27.4 pg (ref 26.0–34.0)
MCHC: 29.5 g/dL — ABNORMAL LOW (ref 30.0–36.0)
MCV: 92.9 fL (ref 80.0–100.0)
Platelets: 490 10*3/uL — ABNORMAL HIGH (ref 150–400)
RBC: 5.04 MIL/uL (ref 4.22–5.81)
RDW: 17.2 % — ABNORMAL HIGH (ref 11.5–15.5)
WBC: 7.2 10*3/uL (ref 4.0–10.5)
nRBC: 0 % (ref 0.0–0.2)

## 2020-10-25 LAB — LACTIC ACID, PLASMA
Lactic Acid, Venous: 4.5 mmol/L (ref 0.5–1.9)
Lactic Acid, Venous: 3.8 mmol/L (ref 0.5–1.9)

## 2020-10-25 LAB — BLOOD GAS, ARTERIAL
Acid-base deficit: 0.3 mmol/L (ref 0.0–2.0)
Bicarbonate: 21.9 mmol/L (ref 20.0–28.0)
FIO2: 50
O2 Saturation: 10.3 %
Patient temperature: 37
pCO2 arterial: 44.2 mmHg (ref 32.0–48.0)
pH, Arterial: 7.361 (ref 7.350–7.450)
pO2, Arterial: <31 mmHg — CL (ref 83.0–108.0)

## 2020-10-25 LAB — MAGNESIUM: Magnesium: 2.1 mg/dL (ref 1.7–2.4)

## 2020-10-25 LAB — TROPONIN I (HIGH SENSITIVITY)
Troponin I (High Sensitivity): 204 ng/L (ref <18)
Troponin I (High Sensitivity): 141 ng/L (ref <18)

## 2020-10-25 LAB — PROCALCITONIN: Procalcitonin: 0.46 ng/mL

## 2020-10-25 LAB — OCCULT BLOOD GASTRIC / DUODENUM (SPECIMEN CUP)
Occult Blood, Gastric: NEGATIVE
pH, Gastric: 3

## 2020-10-25 LAB — MRSA PCR SCREENING: MRSA by PCR: POSITIVE — AB

## 2020-10-25 MED ORDER — PIPERACILLIN-TAZOBACTAM 3.375 G IVPB 30 MIN
3.3750 g | Freq: Four times a day (QID) | INTRAVENOUS | Status: DC
  Administered 2020-10-26: 3.375 g via INTRAVENOUS
  Filled 2020-10-25 (×2): qty 50

## 2020-10-25 MED ORDER — PRISMASOL BGK 4/2.5 32-4-2.5 MEQ/L EC SOLN: Status: DC

## 2020-10-25 MED ORDER — HYDROCORTISONE NA SUCCINATE PF 100 MG IJ SOLR
100.0000 mg | Freq: Three times a day (TID) | INTRAMUSCULAR | Status: DC
  Administered 2020-10-25 – 2020-10-27 (×7): 100 mg via INTRAVENOUS
  Filled 2020-10-25 (×7): qty 2

## 2020-10-25 MED ORDER — PRISMASOL BGK 0/2.5 32-2.5 MEQ/L REPLACEMENT SOLN
Status: DC
  Filled 2020-10-25: qty 5000

## 2020-10-25 MED ORDER — HEPARIN SOD (PORK) LOCK FLUSH 100 UNIT/ML IV SOLN
500.0000 [IU] | Freq: Once | INTRAVENOUS | Status: AC
  Administered 2020-10-25: 500 [IU] via INTRAVENOUS
  Filled 2020-10-25: qty 5

## 2020-10-25 MED ORDER — MUPIROCIN 2 % EX OINT
1.0000 "application " | TOPICAL_OINTMENT | Freq: Two times a day (BID) | CUTANEOUS | Status: AC
  Administered 2020-10-25 – 2020-10-29 (×9): 1 via NASAL
  Filled 2020-10-25: qty 22

## 2020-10-25 MED ORDER — DEXTROSE 50 % IV SOLN
1.0000 | Freq: Once | INTRAVENOUS | Status: AC
  Administered 2020-10-25: 50 mL via INTRAVENOUS
  Filled 2020-10-25: qty 50

## 2020-10-25 MED ORDER — DIPHENHYDRAMINE HCL 25 MG PO CAPS: 25.0000 mg | ORAL_CAPSULE | Freq: Four times a day (QID) | ORAL | Status: DC | PRN

## 2020-10-25 MED ORDER — SODIUM CHLORIDE 0.9 % IV BOLUS
250.0000 mL | Freq: Once | INTRAVENOUS | Status: AC
  Administered 2020-10-25: 250 mL via INTRAVENOUS

## 2020-10-25 MED ORDER — DOBUTAMINE IN D5W 4-5 MG/ML-% IV SOLN
2.5000 ug/kg/min | INTRAVENOUS | Status: DC
  Administered 2020-10-25: 2.5 ug/kg/min via INTRAVENOUS
  Filled 2020-10-25: qty 250

## 2020-10-25 MED ORDER — DOBUTAMINE IN D5W 4-5 MG/ML-% IV SOLN
1.0000 ug/kg/min | INTRAVENOUS | Status: DC
  Administered 2020-10-28: 2.5 ug/kg/min via INTRAVENOUS
  Filled 2020-10-25: qty 250

## 2020-10-25 MED ORDER — THIAMINE HCL 100 MG/ML IJ SOLN
100.0000 mg | Freq: Every day | INTRAMUSCULAR | Status: DC
  Administered 2020-10-29 – 2020-10-31 (×2): 100 mg via INTRAVENOUS
  Filled 2020-10-25 (×4): qty 2

## 2020-10-25 MED ORDER — SODIUM BICARBONATE 8.4 % IV SOLN
INTRAVENOUS | Status: DC
  Filled 2020-10-25 (×4): qty 1000

## 2020-10-25 MED ORDER — LORAZEPAM 1 MG PO TABS: 1.0000 mg | ORAL_TABLET | ORAL | Status: DC | PRN

## 2020-10-25 MED ORDER — HEPARIN SODIUM (PORCINE) 1000 UNIT/ML DIALYSIS
1000.0000 [IU] | INTRAMUSCULAR | Status: DC | PRN
  Filled 2020-10-25: qty 6

## 2020-10-25 MED ORDER — POLYETHYLENE GLYCOL 3350 17 G PO PACK
17.0000 g | PACK | Freq: Every day | ORAL | Status: DC
  Administered 2020-10-25 – 2020-10-27 (×3): 17 g via ORAL
  Filled 2020-10-25 (×3): qty 1

## 2020-10-25 MED ORDER — VASOPRESSIN 20 UNITS/100 ML INFUSION FOR SHOCK
0.0000 [IU]/min | INTRAVENOUS | Status: DC
  Administered 2020-10-25: 0.03 [IU]/min via INTRAVENOUS
  Filled 2020-10-25 (×2): qty 100

## 2020-10-25 MED ORDER — VANCOMYCIN HCL 2000 MG/400ML IV SOLN
2000.0000 mg | Freq: Once | INTRAVENOUS | Status: AC
  Administered 2020-10-25: 2000 mg via INTRAVENOUS

## 2020-10-25 MED ORDER — NOREPINEPHRINE 4 MG/250ML-% IV SOLN
0.0000 ug/min | INTRAVENOUS | Status: DC
  Administered 2020-10-25 (×2): 6 ug/min via INTRAVENOUS
  Filled 2020-10-25: qty 250

## 2020-10-25 MED ORDER — VANCOMYCIN VARIABLE DOSE PER UNSTABLE RENAL FUNCTION (PHARMACIST DOSING): Status: DC

## 2020-10-25 MED ORDER — PIPERACILLIN-TAZOBACTAM 3.375 G IVPB
3.3750 g | Freq: Once | INTRAVENOUS | Status: AC
  Administered 2020-10-25: 3.375 g via INTRAVENOUS
  Filled 2020-10-25: qty 50

## 2020-10-25 MED ORDER — SODIUM CHLORIDE 0.9 % FOR CRRT: INTRAVENOUS_CENTRAL | Status: DC | PRN

## 2020-10-25 MED ORDER — FENTANYL CITRATE (PF) 100 MCG/2ML IJ SOLN: 100.0000 ug | Freq: Once | INTRAMUSCULAR | Status: AC

## 2020-10-25 MED ORDER — PANTOPRAZOLE SODIUM 40 MG IV SOLR
40.0000 mg | INTRAVENOUS | Status: DC
  Administered 2020-10-25 – 2020-11-03 (×10): 40 mg via INTRAVENOUS
  Filled 2020-10-25 (×11): qty 40

## 2020-10-25 MED ORDER — LACTATED RINGERS IV SOLN: INTRAVENOUS | Status: DC

## 2020-10-25 MED ORDER — CHLORHEXIDINE GLUCONATE CLOTH 2 % EX PADS
6.0000 | MEDICATED_PAD | Freq: Every day | CUTANEOUS | Status: AC
  Administered 2020-10-26 – 2020-10-29 (×4): 6 via TOPICAL

## 2020-10-25 MED ORDER — LACTATED RINGERS IV BOLUS
30.0000 mL/kg | Freq: Once | INTRAVENOUS | Status: AC
  Administered 2020-10-25: 2265 mL via INTRAVENOUS

## 2020-10-25 MED ORDER — FOLIC ACID 1 MG PO TABS
1.0000 mg | ORAL_TABLET | Freq: Every day | ORAL | Status: DC
  Administered 2020-10-26 – 2020-10-30 (×5): 1 mg
  Filled 2020-10-25 (×5): qty 1

## 2020-10-25 MED ORDER — ENSURE ENLIVE PO LIQD: 237.0000 mL | Freq: Two times a day (BID) | ORAL | Status: DC

## 2020-10-25 MED ORDER — PRISMASOL BGK 0/2.5 32-2.5 MEQ/L REPLACEMENT SOLN
Status: DC
  Filled 2020-10-25 (×2): qty 5000

## 2020-10-25 MED ORDER — CALCIUM GLUCONATE-NACL 1-0.675 GM/50ML-% IV SOLN
1.0000 g | Freq: Once | INTRAVENOUS | Status: AC
  Administered 2020-10-25: 1000 mg via INTRAVENOUS
  Filled 2020-10-25: qty 50

## 2020-10-25 MED ORDER — PROSOURCE TF PO LIQD
45.0000 mL | Freq: Two times a day (BID) | ORAL | Status: DC
  Administered 2020-10-25 – 2020-10-26 (×2): 45 mL
  Filled 2020-10-25 (×2): qty 45

## 2020-10-25 MED ORDER — HEPARIN SOD (PORK) LOCK FLUSH 100 UNIT/ML IV SOLN
500.0000 [IU] | Freq: Once | INTRAVENOUS | Status: AC
  Administered 2020-10-25: 500 [IU] via INTRAVENOUS

## 2020-10-25 MED ORDER — INSULIN ASPART 100 UNIT/ML IV SOLN
10.0000 [IU] | Freq: Once | INTRAVENOUS | Status: AC
  Administered 2020-10-25: 10 [IU] via INTRAVENOUS

## 2020-10-25 MED ORDER — PIPERACILLIN-TAZOBACTAM 3.375 G IVPB
3.3750 g | Freq: Three times a day (TID) | INTRAVENOUS | Status: AC
  Administered 2020-10-25 (×2): 3.375 g via INTRAVENOUS
  Filled 2020-10-25 (×2): qty 50

## 2020-10-25 MED ORDER — LORAZEPAM 2 MG/ML IJ SOLN: 1.0000 mg | INTRAMUSCULAR | Status: DC | PRN

## 2020-10-25 MED ORDER — PRISMASOL BGK 4/2.5 32-4-2.5 MEQ/L REPLACEMENT SOLN: Status: DC

## 2020-10-25 MED ORDER — VITAL HIGH PROTEIN PO LIQD
1000.0000 mL | ORAL | Status: DC
  Administered 2020-10-25: 1000 mL

## 2020-10-25 MED ORDER — DOCUSATE SODIUM 50 MG/5ML PO LIQD
50.0000 mg | Freq: Every day | ORAL | Status: DC
  Administered 2020-10-25 – 2020-10-27 (×3): 50 mg
  Filled 2020-10-25 (×3): qty 10

## 2020-10-25 MED ORDER — FENTANYL 2500MCG IN NS 250ML (10MCG/ML) PREMIX INFUSION
0.0000 ug/h | INTRAVENOUS | Status: DC
  Administered 2020-10-25: 25 ug/h via INTRAVENOUS
  Administered 2020-10-26: 110 ug/h via INTRAVENOUS
  Administered 2020-10-26: 100 ug/h via INTRAVENOUS
  Administered 2020-10-27: 80 ug/h via INTRAVENOUS
  Administered 2020-10-28 – 2020-10-29 (×2): 150 ug/h via INTRAVENOUS
  Administered 2020-10-30: 200 ug/h via INTRAVENOUS
  Administered 2020-10-30: 125 ug/h via INTRAVENOUS
  Administered 2020-10-31: 250 ug/h via INTRAVENOUS
  Filled 2020-10-25 (×9): qty 250

## 2020-10-25 MED ORDER — SODIUM ZIRCONIUM CYCLOSILICATE 10 G PO PACK
10.0000 g | PACK | Freq: Three times a day (TID) | ORAL | Status: DC
  Administered 2020-10-25: 10 g
  Filled 2020-10-25 (×3): qty 1

## 2020-10-25 MED ORDER — VANCOMYCIN HCL 1000 MG/200ML IV SOLN: 1000.0000 mg | INTRAVENOUS | Status: DC

## 2020-10-25 MED ORDER — FENTANYL CITRATE (PF) 100 MCG/2ML IJ SOLN
INTRAMUSCULAR | Status: AC
  Administered 2020-10-25: 100 ug via INTRAVENOUS
  Filled 2020-10-25: qty 2

## 2020-10-25 MED ORDER — THIAMINE HCL 100 MG PO TABS
100.0000 mg | ORAL_TABLET | Freq: Every day | ORAL | Status: DC
  Administered 2020-10-26 – 2020-10-30 (×4): 100 mg
  Filled 2020-10-25 (×4): qty 1

## 2020-10-25 MED ORDER — NOREPINEPHRINE 16 MG/250ML-% IV SOLN
0.0000 ug/min | INTRAVENOUS | Status: DC
  Administered 2020-10-26: 2 ug/min via INTRAVENOUS
  Filled 2020-10-25: qty 250

## 2020-10-25 MED ORDER — ADULT MULTIVITAMIN LIQUID CH
15.0000 mL | Freq: Every day | ORAL | Status: DC
  Administered 2020-10-26: 15 mL
  Filled 2020-10-25: qty 15

## 2020-10-25 MED ORDER — PIPERACILLIN-TAZOBACTAM 3.375 G IVPB 30 MIN
3.3750 g | Freq: Four times a day (QID) | INTRAVENOUS | Status: DC
  Filled 2020-10-25: qty 50

## 2020-10-25 MED ORDER — VASOPRESSIN 20 UNITS/100 ML INFUSION FOR SHOCK
INTRAVENOUS | Status: AC
  Filled 2020-10-25: qty 100

## 2020-10-25 MED ORDER — VANCOMYCIN HCL 1250 MG/250ML IV SOLN: 1250.0000 mg | INTRAVENOUS | Status: DC

## 2020-10-25 MED ORDER — PRISMASOL BGK 0/2.5 32-2.5 MEQ/L EC SOLN
Status: DC
  Filled 2020-10-25 (×5): qty 5000

## 2020-10-25 MED ORDER — HEPARIN SODIUM (PORCINE) 1000 UNIT/ML DIALYSIS
1000.0000 [IU] | INTRAMUSCULAR | Status: DC | PRN
  Administered 2020-10-28: 2800 [IU] via INTRAVENOUS_CENTRAL
  Filled 2020-10-25: qty 6
  Filled 2020-10-25: qty 3
  Filled 2020-10-25: qty 6

## 2020-10-25 MED ORDER — PRISMASOL BGK 0/2.5 32-2.5 MEQ/L EC SOLN
Status: DC
  Filled 2020-10-25 (×2): qty 5000

## 2020-10-25 NOTE — Progress Notes (Signed)
   10/25/20 0415  Vitals  BP (!) 61/45  MAP (mmHg) (!) 47  Pulse Rate 78  ECG Heart Rate 83  Resp (!) 28  Oxygen Therapy  SpO2 100 %  Pre-WUA / WUA Start  [place] Agitation Sedation Scale (RASS) -1  MEWS Score  MEWS Temp 0  MEWS Systolic 3  MEWS Pulse 0  MEWS RR 2  MEWS LOC 2  MEWS Score 7  MEWS Score Color Red  Provider Notification  Provider Name/Title Frankey Shown, DO  Date Provider Notified 10/25/20  Time Provider Notified [phone]  Notification Type Face-to-face  Notification Reason Other (Comment) (Hypotension 61/45 (47).)  Provider response In department;See new orders (250 mL IV fluid bolus.)  Date of Provider Response 10/25/20  Time of Provider Response [phone]

## 2020-10-25 NOTE — Progress Notes (Signed)
Pt arrived at [facility] for ongoing critical care.  Has acute cardiogenic shock, anuric/oliguric AKI, and hyperkalemia.  On pressors and inotropes.   Will start CRRT.  Pre 1084mL/h 4K, Dialysate 1565mL/h 4K, post 422mL/h 2K.    No heparin to start.  UF net neg 5mL/h

## 2020-10-25 NOTE — Progress Notes (Signed)
Spoke with Florentina Addison who states that patient had CVL placed and is transferring to [facility] at this time.  PICC order canceled, please reorder if patient has need in the future.  Thank you.

## 2020-10-25 NOTE — Progress Notes (Signed)
Dr Annie Sable Nephrologist okay'd to insert PICC .

## 2020-10-25 NOTE — Progress Notes (Signed)
PROGRESS NOTE    Kyle Peck  VHQ:[id number] DOB: 1960-11-13 DOA: 10/20/2020 PCP: Pcp, No   Brief Narrative:   Kyle Belcheris a 60 y.o.malewith medical history significant fortobacco use and occasional alcohol use who presents to the emergency department due to several days of leg and abdominal swelling.He was thought to potentially have new-onsetCHF and has been started on IV Lasix twice daily for diuresis. 2D echocardiogramdemonstrates LVEF of 15% with global hypokinesis. Cardiologyrecommending cardiac catheterization after adequate diuresis.  Unfortunately, on 5/22 he developed worsening agitation despite use of IV Ativan and had to be transferred to the ICU and was started on Precedex drip.  Shortly after this, he developed periods of apnea and was quite unresponsive and required intubation for airway protection.  He then became hypotensive and required pressor support.  On the morning of 5/23, he is noted to have ongoing pressor needs and has also developed AKI with hyperkalemia and will require transfer to [facility] ICU for CRRT.  Assessment & Plan:   Principal Problem:   Elevated brain natriuretic peptide (BNP) level Active Problems:   CHF (congestive heart failure) (HCC)   Transaminitis   Thrombocytosis   Hypoalbuminemia due to protein-calorie malnutrition (HCC)   Tobacco use   Circulatory shock-suspect Cardiogenic  -In the setting of severe LV dysfunction, EF 15% and Precedex use -Continue pressors with norepinephrine and vasopressin as ordered -Hydrocortisone stress dose has been added as well as fluid bolus -Transfer to PCCM service at [facility] for further aggressive management -Appreciate heart failure team assessment on arrival, Dr. Aundra Dubin notified by Cardiology service  Acute hypoxemic respiratory failure with apnea -Status post intubation and mechanical ventilation  Lactic acidosis -Likely related to above factors, but cannot rule out sepsis  physiology -Plan to obtain blood cultures -Start Zosyn and vancomycin empirically -Fluid bolus of 30 cc/kg ordered -Continue to monitor  Oliguric AKI with hyperkalemia -Appreciate nephrology evaluation with plans for CRRT upon transfer -Trialysis catheter placed by PCCM, appreciated -Patient given calcium, insulin/dextrose, and Lokelma -Bicarbonate infusion per Nephrology started  Acute systolic CHF decompensation in the setting of severe cardiomyopathy -Likely related to alcohol and polysubstance abusewith prior cocaine use -LVEF 15% -Appreciate cardiology evaluation with TSH3.2, plans for cardiac catheterization once stabilized -Further management of volume status per heart failure team as noted above  Alcohol withdrawal -Precedex will be changed to Versed and fentanyl to assist with sedation due to hypotension  Transaminitis -Related to EtOh use -Recheck labs in a.m. -Right upper quadrant ultrasound with findings of ascites and possible cirrhosis -Hepatitis panel nonreactive -Continue to monitor a.m. labs  Hypoalbuminemia -Likely related to mild protein calorie malnutrition  History of tobacco abuse -Counseled on cessation  History of polysubstance abuse -Noted to be cocaine positive   DVT prophylaxis:Lovenox Code Status:Full Family Communication:Discussed with wife on phone 5/23 Disposition Plan: Status is: Inpatient  Remains inpatient appropriate because:Ongoing diagnostic testing needed not appropriate for outpatient work up, IV treatments appropriate due to intensity of illness or inability to take PO and Inpatient level of care appropriate due to severity of illness   Dispo: The patient is from:Home Anticipated d/c is to:Uncertain Patient currently is not medically stable to d/c. Difficult to place patient No   Consultants:  Cardiology-Heart Failure  PCCM  Nephrology  Procedures:  See  below  Antimicrobials:  Anti-infectives (From admission, onward)   Start     Dose/Rate Route Frequency Ordered Stop   10/25/20 0845  vancomycin (VANCOREADY) IVPB 2000 mg/400 mL  2,000 mg 200 mL/hr over 120 Minutes Intravenous  Once 10/25/20 0751     10/25/20 0830  piperacillin-tazobactam (ZOSYN) IVPB 3.375 g        3.375 g 100 mL/hr over 30 Minutes Intravenous  Once 10/25/20 0751 10/25/20 0841       Subjective: Patient seen and evaluated today and is noted to be sedated on the ventilator.  Significant overnight events with multiple pressor use initiated and now with hyperkalemia and oliguria.  Objective: Vitals:   10/25/20 0639 10/25/20 0645 10/25/20 0745 10/25/20 0900  BP: 119/74 (!) 136/98  108/77  Pulse: 79 85  99  Resp: (!) 36 (!) 39  (!) 25  Temp:   (!) 101.1 F (38.4 C)   TempSrc:   Axillary   SpO2: 92% 93%  100%  Weight:      Height:        Intake/Output Summary (Last 24 hours) at 10/25/2020 1008 Last data filed at 10/25/2020 0856 Gross per 24 hour  Intake 1922.71 ml  Output 500 ml  Net 1422.71 ml   Filed Weights   10/24/20 0601 10/24/20 1353 10/25/20 0608  Weight: 90.3 kg 90.4 kg 89.6 kg    Examination:  General exam: Appears sedated on ventilator Respiratory system: Clear to auscultation. Respiratory effort normal.  Intubated Cardiovascular system: S1 & S2 heard, RRR.  Gastrointestinal system: Abdomen is soft Central nervous system: Sedated Extremities: No edema Skin: No significant lesions noted Psychiatry: Cannot be assessed    Data Reviewed: I have personally reviewed following labs and imaging studies  CBC: Recent Labs  Lab 10/21/20 0033 10/21/20 0404 10/22/20 0434 10/25/20 0537  WBC 5.6 6.4 5.6 7.2  NEUTROABS 4.0  --   --   --   HGB 12.4* 12.7* 11.7* 13.8  HCT 42.0 42.6 38.2* 46.8  MCV 93.1 93.6 91.2 92.9  PLT 431* 435* 390 758*   Basic Metabolic Panel: Recent Labs  Lab 10/21/20 0404 10/22/20 0434 10/23/20 0503  10/24/20 0545 10/25/20 0537 10/25/20 0654  NA 138 136 137 139 137 135  K 4.3 3.6 4.2 4.5 6.8* 6.9*  CL 103 99 100 103 101 101  CO2 '27 28 28 28 24 24  ' GLUCOSE 104* 110* 80 110* 144* 126*  BUN 20 22* 22* 20 26* 26*  CREATININE 1.05 0.92 1.01 0.91 1.82* 2.07*  CALCIUM 8.5* 8.3* 8.8* 8.9 8.4* 8.3*  MG 2.1 1.9 2.0 1.8 2.1  --   PHOS 4.1  --   --   --   --   --    GFR: Estimated Creatinine Clearance: 40.5 mL/min (A) (by C-G formula based on SCr of 2.07 mg/dL (H)). Liver Function Tests: Recent Labs  Lab 10/21/20 0404 10/22/20 0434 10/23/20 0503 10/24/20 0545 10/25/20 0537  AST 86* 54* 44* 37 81*  ALT 167* 119* 109* 87* 97*  ALKPHOS 83 77 88 95 81  BILITOT 1.0 0.9 1.1 0.7 2.2*  PROT 6.4* 5.6* 6.5 6.5 6.8  ALBUMIN 3.4* 3.0* 3.4* 3.4* 3.5   No results for input(s): LIPASE, AMYLASE in the last 168 hours. Recent Labs  Lab 10/21/20 2114  AMMONIA 12   Coagulation Profile: Recent Labs  Lab 10/21/20 0404  INR 1.3*   Cardiac Enzymes: No results for input(s): CKTOTAL, CKMB, CKMBINDEX, TROPONINI in the last 168 hours. BNP (last 3 results) No results for input(s): PROBNP in the last 8760 hours. HbA1C: No results for input(s): HGBA1C in the last 72 hours. CBG: Recent Labs  Lab 10/24/20 1958 10/25/20  0023 10/25/20 0435 10/25/20 0748  GLUCAP 144* 133* 139* 151*   Lipid Profile: No results for input(s): CHOL, HDL, LDLCALC, TRIG, CHOLHDL, LDLDIRECT in the last 72 hours. Thyroid Function Tests: Recent Labs    10/23/20 0503  TSH 3.217   Anemia Panel: No results for input(s): VITAMINB12, FOLATE, FERRITIN, TIBC, IRON, RETICCTPCT in the last 72 hours. Sepsis Labs: Recent Labs  Lab 10/25/20 0654 10/25/20 0806  PROCALCITON  --  0.46  LATICACIDVEN 4.5*  --     Recent Results (from the past 240 hour(s))  SARS CORONAVIRUS 2 (TAT 6-24 HRS) Nasopharyngeal Nasopharyngeal Swab     Status: None   Collection Time: 10/21/20  2:00 AM   Specimen: Nasopharyngeal Swab  Result  Value Ref Range Status   SARS Coronavirus 2 NEGATIVE NEGATIVE Final    Comment: (NOTE) SARS-CoV-2 target nucleic acids are NOT DETECTED.  The SARS-CoV-2 RNA is generally detectable in upper and lower respiratory specimens during the acute phase of infection. Negative results do not preclude SARS-CoV-2 infection, do not rule out co-infections with other pathogens, and should not be used as the sole basis for treatment or other patient management decisions. Negative results must be combined with clinical observations, patient history, and epidemiological information. The expected result is Negative.  Fact Sheet for Patients: SugarRoll.be  Fact Sheet for Healthcare Providers: https://www.woods-mathews.com/  This test is not yet approved or cleared by the [place] FDA and  has been authorized for detection and/or diagnosis of SARS-CoV-2 by FDA under an Emergency Use Authorization (EUA). This EUA will remain  in effect (meaning this test can be used) for the duration of the COVID-19 declaration under Se ction 564(b)(1) of the Act, 21 U.S.C. section 360bbb-3(b)(1), unless the authorization is terminated or revoked sooner.  Performed at [facility], [place] [address] [place], [place] [address]   MRSA PCR Screening     Status: Abnormal   Collection Time: 10/25/20  6:30 AM   Specimen: Nasal Mucosa; Nasopharyngeal  Result Value Ref Range Status   MRSA by PCR POSITIVE (A) NEGATIVE Final    Comment:        The GeneXpert MRSA Assay (FDA approved for NASAL specimens only), is one component of a comprehensive MRSA colonization surveillance program. It is not intended to diagnose MRSA infection nor to guide or monitor treatment for MRSA infections. RESULT CALLED TO, READ BACK BY AND VERIFIED WITH: FOLEY,B. AT 0845 BY HUFFINES,S ON 10/25/20. Performed at [facility], [address] [place], [place] [address]   Culture, blood (routine  x 2)     Status: None (Preliminary result)   Collection Time: 10/25/20  8:06 AM   Specimen: BLOOD  Result Value Ref Range Status   Specimen Description BLOOD RIGHT ANTECUBITAL  Final   Special Requests   Final    BOTTLES DRAWN AEROBIC AND ANAEROBIC Blood Culture adequate volume Performed at [facility], [address], [place], [place] [address]    Culture PENDING  Incomplete   Report Status PENDING  Incomplete  Culture, blood (routine x 2)     Status: None (Preliminary result)   Collection Time: 10/25/20  8:06 AM   Specimen: BLOOD  Result Value Ref Range Status   Specimen Description BLOOD RIGHT WRIST  Final   Special Requests   Final    BOTTLES DRAWN AEROBIC ONLY Blood Culture adequate volume Performed at [facility], [address], [place], [place] [address]    Culture PENDING  Incomplete   Report Status  PENDING  Incomplete         Radiology Studies: DG Chest 1 View  Result Date: 10/25/2020 CLINICAL DATA:  Respiratory failure. EXAM: CHEST  1 VIEW COMPARISON:  10/24/2020 FINDINGS: Stable cardiac enlargement. Endotracheal tube remains with the tip approximately 3 cm above the carina. Low bilateral lung volumes. There is no evidence of significant pulmonary edema, consolidation, pneumothorax or pleural fluid. IMPRESSION: Stable cardiac enlargement without overt pulmonary edema. Endotracheal tube remains. Low bilateral lung volumes. Electronically Signed   By: Aletta Edouard M.D.   On: 10/25/2020 08:45   DG CHEST PORT 1 VIEW  Result Date: 10/24/2020 CLINICAL DATA:  ETT and NG tube placement. EXAM: PORTABLE CHEST 1 VIEW COMPARISON:  Exam from Oct 21, 2020. FINDINGS: EKG leads project over the chest. Endotracheal tube tip between clavicular heads approximately 4.6 cm from the carina. Gastric tube coursing through in off the field of the radiograph below the LEFT hemidiaphragm. Cardiomediastinal contours remain enlarged Hilar structures are stable. Lungs are clear. On limited  assessment no acute skeletal process. IMPRESSION: Support devices as described. No consolidation or effusion with cardiomegaly as before. Electronically Signed   By: Zetta Bills M.D.   On: 10/24/2020 17:02   DG Chest Port 1V same Day  Result Date: 10/25/2020 CLINICAL DATA:  Status post central line placement for dialysis. EXAM: PORTABLE CHEST 1 VIEW COMPARISON:  Single-view of the chest earlier today. FINDINGS: New double lumen right IJ approach dialysis catheter is in place. Tip of the catheter projects in the mid superior vena cava. ETT and NG tube are unchanged. No pneumothorax. Marked enlargement of the cardiopericardial silhouette again seen. Left basilar opacity is unchanged. IMPRESSION: New right IJ approach dialysis catheter tip projects in the mid superior vena cava. Negative for pneumothorax. No change in left basilar airspace opacity likely due to atelectasis. Marked enlargement of the cardiopericardial silhouette could be due to cardiomegaly and/or pericardial effusion. Electronically Signed   By: Inge Rise M.D.   On: 10/25/2020 09:48   [place] EKG SITE RITE  Result Date: 10/25/2020 If [facility] image not attached, placement could not be confirmed due to current cardiac rhythm.       Scheduled Meds: . chlorhexidine gluconate (MEDLINE KIT)  15 mL Mouth Rinse BID  . Chlorhexidine Gluconate Cloth  6 each Topical Daily  . Chlorhexidine Gluconate Cloth  6 each Topical Q0600  . enoxaparin (LOVENOX) injection  40 mg Subcutaneous Q24H  . feeding supplement  237 mL Per Tube BID BM  . [START ON 2/95/1884] folic acid  1 mg Per Tube Daily  . hydrocortisone sod succinate (SOLU-CORTEF) inj  100 mg Intravenous Q8H  . mouth rinse  15 mL Mouth Rinse 10 times per day  . multivitamin  15 mL Per Tube Daily  . mupirocin ointment  1 application Nasal BID  . sodium zirconium cyclosilicate  10 g Per Tube TID  . [START ON 10/26/2020] thiamine  100 mg Per Tube Daily   Or  . [START ON 10/26/2020]  thiamine  100 mg Intravenous Daily   Continuous Infusions: . sodium chloride    . dexmedetomidine (PRECEDEX) IV infusion Stopped (10/25/20 0415)  . fentaNYL infusion INTRAVENOUS 25 mcg/hr (10/25/20 0923)  . midazolam 10 mg/hr (10/25/20 0856)  . norepinephrine (LEVOPHED) Adult infusion    . sodium bicarbonate 150 mEq in D5W infusion    . vancomycin 2,000 mg (10/25/20 0815)  . vasopressin 0.03 Units/min (10/25/20 0856)     LOS: 4 days  Total critical care time: 65 minutes.    Joelle Flessner Darleen Crocker, DO [facility]  If 7PM-7AM, please contact night-coverage [email or web address] 10/25/2020, 10:08 AM

## 2020-10-25 NOTE — Progress Notes (Signed)
eLink Physician-Brief Progress Note Patient Name: Kyle Peck DOB: 02/22/1961 MRN: [id number]   Date of Service  10/25/2020  HPI/Events of Note  Blood culture from 10/25/2020 is positive for GPC's in Aerobic bottle only. The patient is already on Vancomycin which will cover GPCs  eICU Interventions  Continue present management.      Intervention Category Major Interventions: Infection - evaluation and management  [name] 10/25/2020, 10:33 PM

## 2020-10-25 NOTE — Progress Notes (Addendum)
eLink Physician-Brief Progress Note Patient Name: Kyle Peck DOB: [birth year] MRN: [id number]   Date of Service  10/25/2020  HPI/Events of Note  Review of CXR post L IJ CVL placement. Interval placement of left neck vascular catheter, tip projecting in the vicinity of the brachiocephalic confluence, directed slightly superiorly, possibly with azygos placement. No pneumothorax and 2. Hyperkalemia - K+ = 6.0.   eICU Interventions  Plan: 1. Will request PCCM ground team evaluate CXR for possible L IJ CVL repositioning.  2. Patient being started on CRRT.     Intervention Category Major Interventions: Other:  [name] 10/25/2020, 8:24 PM

## 2020-10-25 NOTE — Progress Notes (Signed)
After numerous sticks for arterial blood gas only femoral blood gas was obtained. Blood gas turned out to be venous with PO2 in low 30's. Oxygen has been increased to 60 from 50 for now.

## 2020-10-25 NOTE — Progress Notes (Addendum)
Pharmacy Antibiotic Note  Kyle Peck is a 60 y.o. male admitted on 10/20/2020 with sepsis.  Pharmacy has been consulted for Vancomycin and Zosyn dosing. Plans to start CRRT tonight -vancomycin 2000mg  given ~ 8am today -MRSA PCR - positive, blood cultures- ngtd  Plan: -Check a random vancomycin level in am -Zosyn 3.375gm IV q6h  -Will follow cultures and clinical progress   Height: 5\' 7"  (170.2 cm) Weight: 89.6 kg (197 lb 8.5 oz) IBW/kg (Calculated) : 66.1  Temp (24hrs), Avg:99.2 F (37.3 C), Min:97.6 F (36.4 C), Max:101.4 F (38.6 C)  Recent Labs  Lab 10/21/20 0033 10/21/20 0404 10/22/20 0434 10/23/20 0503 10/24/20 0545 10/25/20 0537 10/25/20 0654 10/25/20 0947  WBC 5.6 6.4 5.6  --   --  7.2  --   --   CREATININE 1.09 1.05 0.92 1.01 0.91 1.82* 2.07* 1.83*  LATICACIDVEN  --   --   --   --   --   --  4.5* 3.8*    Estimated Creatinine Clearance: 45.8 mL/min (A) (by C-G formula based on SCr of 1.83 mg/dL (H)).    No Known Allergies  Antimicrobials this admission: Vanco 5/23 >>  Zosyn 5/23 >>   Microbiology results: 5/23 BCx: ngtd 5/23 MRSA PCR: positive  Thank you for allowing pharmacy to be a part of this patient's care.  6/23, PharmD Clinical Pharmacist **Pharmacist phone directory can now be found on [email or web address] ([email or web address]).  [facility] under [facility].

## 2020-10-25 NOTE — Consult Note (Signed)
[facility] Renal Consultation Note  Requesting MD: Sherryll Burger Indication for Consultation: AKI, hyperkalemia  HPI:  Kyle Peck is a 60 y.o. male with past medical history significant for polysubstance abuse.  History is obtained from the chart.  He presented to medical attention due to leg and abdominal swelling, DOE on 5/19.  Found to have an EF of 15 %- given IV lasix and heart cath was planned.  Over the last 24 hours, pt developed acute ETOH withdrawal, had to be sedated-  Then developed resp failure req intubation.  Has become more hypotensive overnight- req pressors which have been maxed out-  Baseline crt under 1, was 0.9 yesterday -  Now anuric and K was 6.8 this AM (getting LR boluses and drip)  With crt of 2.  CCM is at bedside putting in central line, we changed it to a trialysis cath as unfortunately I feel he needs to transfer to [facility] and get CRRT.  He is not that acidotic yet but has a lactate of 4.5  Creatinine, Ser  Date/Time Value Ref Range Status  10/25/2020 06:54 AM 2.07 (H) 0.61 - 1.24 mg/dL Final  84/69/6295 28:41 AM 1.82 (H) 0.61 - 1.24 mg/dL Final  32/44/0102 72:53 AM 0.91 0.61 - 1.24 mg/dL Final  66/44/0347 42:59 AM 1.01 0.61 - 1.24 mg/dL Final  56/38/7564 33:29 AM 0.92 0.61 - 1.24 mg/dL Final  51/88/4166 06:30 AM 1.05 0.61 - 1.24 mg/dL Final  16/06/930 35:57 AM 1.09 0.61 - 1.24 mg/dL Final     PMHx:   Past Medical History:  Diagnosis Date  . Alcohol use   . Arthritis   . Marijuana use   . Tobacco abuse     Past Surgical History:  Procedure Laterality Date  . FRACTURE SURGERY     cheek     Family Hx:  Family History  Problem Relation Age of Onset  . Congestive Heart Failure Mother   . Congestive Heart Failure Father     Social History:  reports that he has been smoking cigars and cigarettes. He has been smoking about 0.50 packs per day. He has never used smokeless tobacco. He reports current alcohol use of about 15.0 standard drinks of  alcohol per week. He reports current drug use. Frequency: 2.00 times per week. Drug: Marijuana.  Allergies: No Known Allergies  Medications: Prior to Admission medications   Medication Sig Start Date End Date Taking? Authorizing Provider  Famotidine (PEPCID PO) Take 1 tablet by mouth daily as needed (indigestion).   Yes [provider]    I have reviewed the patient's current medications.  Labs:  Results for orders placed or performed during the hospital encounter of 10/20/20 (from the past 48 hour(s))  Comprehensive metabolic panel     Status: Abnormal   Collection Time: 10/24/20  5:45 AM  Result Value Ref Range   Sodium 139 135 - 145 mmol/L   Potassium 4.5 3.5 - 5.1 mmol/L   Chloride 103 98 - 111 mmol/L   CO2 28 22 - 32 mmol/L   Glucose, Bld 110 (H) 70 - 99 mg/dL    Comment: Glucose reference range applies only to samples taken after fasting for at least 8 hours.   BUN 20 6 - 20 mg/dL   Creatinine, Ser 3.22 0.61 - 1.24 mg/dL   Calcium 8.9 8.9 - 02.5 mg/dL   Total Protein 6.5 6.5 - 8.1 g/dL   Albumin 3.4 (L) 3.5 - 5.0 g/dL   AST 37 15 - 41  U/L   ALT 87 (H) 0 - 44 U/L   Alkaline Phosphatase 95 38 - 126 U/L   Total Bilirubin 0.7 0.3 - 1.2 mg/dL   GFR, Estimated >97 >35 mL/min    Comment: (NOTE) Calculated using the CKD-EPI Creatinine Equation (2021)    Anion gap 8 5 - 15    Comment: Performed at [facility], [address], [place], [place] [address]  Magnesium     Status: None   Collection Time: 10/24/20  5:45 AM  Result Value Ref Range   Magnesium 1.8 1.7 - 2.4 mg/dL    Comment: Performed at [facility], [address], [place], [place] [address]  Blood gas, arterial     Status: Abnormal   Collection Time: 10/24/20  5:46 PM  Result Value Ref Range   FIO2 60.00    pH, Arterial 7.397 7.350 - 7.450   pCO2 arterial 41.3 32.0 - 48.0 mmHg   pO2, Arterial 115 (H) 83.0 - 108.0 mmHg   Bicarbonate 24.9 20.0 - 28.0 mmol/L   Acid-Base Excess 0.6 0.0 - 2.0 mmol/L   O2  Saturation 98.5 %   Patient temperature 37.0    Allens test (pass/fail) PASS PASS    Comment: Performed at [facility], [address] [place], [place] [address]  Glucose, capillary     Status: Abnormal   Collection Time: 10/24/20  7:58 PM  Result Value Ref Range   Glucose-Capillary 144 (H) 70 - 99 mg/dL    Comment: Glucose reference range applies only to samples taken after fasting for at least 8 hours.   Comment 1 Notify RN    Comment 2 Document in Chart   Glucose, capillary     Status: Abnormal   Collection Time: 10/25/20 12:23 AM  Result Value Ref Range   Glucose-Capillary 133 (H) 70 - 99 mg/dL    Comment: Glucose reference range applies only to samples taken after fasting for at least 8 hours.   Comment 1 Notify RN    Comment 2 Document in Chart   Glucose, capillary     Status: Abnormal   Collection Time: 10/25/20  4:35 AM  Result Value Ref Range   Glucose-Capillary 139 (H) 70 - 99 mg/dL    Comment: Glucose reference range applies only to samples taken after fasting for at least 8 hours.   Comment 1 Notify RN    Comment 2 Document in Chart   Blood gas, arterial     Status: Abnormal   Collection Time: 10/25/20  5:18 AM  Result Value Ref Range   FIO2 50.00    pH, Arterial 7.361 7.350 - 7.450    Comment: NURSE NOTIFIED OF POSSIBLE VENOUS STICK   pCO2 arterial 44.2 32.0 - 48.0 mmHg   pO2, Arterial <31.0 (LL) 83.0 - 108.0 mmHg    Comment: CRITICAL RESULT CALLED TO, READ BACK BY AND VERIFIED WITH: MAYNARD,R AT 5:35AM ON 10/25/20 BY FESTERMAN,C    Bicarbonate 21.9 20.0 - 28.0 mmol/L   Acid-base deficit 0.3 0.0 - 2.0 mmol/L   O2 Saturation 10.3 %   Patient temperature 37.0     Comment: Performed at [facility], [address] [place], [place] [address]  Comprehensive metabolic panel     Status: Abnormal   Collection Time: 10/25/20  5:37 AM  Result Value Ref Range   Sodium 137 135 - 145 mmol/L   Potassium 6.8 (HH) 3.5 - 5.1 mmol/L    Comment: CRITICAL RESULT CALLED TO,  READ BACK BY AND  VERIFIED WITH: COCKERTON,J AT 6:35AM ON 10/25/20 BY FESTERMAN,C    Chloride 101 98 - 111 mmol/L   CO2 24 22 - 32 mmol/L   Glucose, Bld 144 (H) 70 - 99 mg/dL    Comment: Glucose reference range applies only to samples taken after fasting for at least 8 hours.   BUN 26 (H) 6 - 20 mg/dL   Creatinine, Ser 6.65 (H) 0.61 - 1.24 mg/dL   Calcium 8.4 (L) 8.9 - 10.3 mg/dL   Total Protein 6.8 6.5 - 8.1 g/dL   Albumin 3.5 3.5 - 5.0 g/dL   AST 81 (H) 15 - 41 U/L   ALT 97 (H) 0 - 44 U/L   Alkaline Phosphatase 81 38 - 126 U/L   Total Bilirubin 2.2 (H) 0.3 - 1.2 mg/dL   GFR, Estimated 42 (L) >60 mL/min    Comment: (NOTE) Calculated using the CKD-EPI Creatinine Equation (2021)    Anion gap 12 5 - 15    Comment: Performed at [facility], [address] [place], [place] [address]  Magnesium     Status: None   Collection Time: 10/25/20  5:37 AM  Result Value Ref Range   Magnesium 2.1 1.7 - 2.4 mg/dL    Comment: Performed at [facility], [address] [place], [place] [address]  CBC     Status: Abnormal   Collection Time: 10/25/20  5:37 AM  Result Value Ref Range   WBC 7.2 4.0 - 10.5 K/uL   RBC 5.04 4.22 - 5.81 MIL/uL   Hemoglobin 13.8 13.0 - 17.0 g/dL   HCT 39.0 30.0 - 92.3 %   MCV 92.9 80.0 - 100.0 fL   MCH 27.4 26.0 - 34.0 pg   MCHC 29.5 (L) 30.0 - 36.0 g/dL   RDW 30.0 (H) 76.2 - 26.3 %   Platelets 490 (H) 150 - 400 K/uL   nRBC 0.0 0.0 - 0.2 %    Comment: Performed at [facility], [address], [place], [place] [address]  Occult blood gastric / duodenum     Status: None   Collection Time: 10/25/20  6:01 AM  Result Value Ref Range   pH, Gastric 3    Occult Blood, Gastric NEGATIVE NEGATIVE    Comment: Performed at [facility], [address], [place], [place] [address]  Basic metabolic panel     Status: Abnormal   Collection Time: 10/25/20  6:54 AM  Result Value Ref Range   Sodium 135 135 - 145 mmol/L   Potassium 6.9 (HH) 3.5 - 5.1 mmol/L    Comment: CRITICAL  RESULT CALLED TO, READ BACK BY AND VERIFIED WITH: HARRIS,B AT 7:15AM ON 10/25/20 BY FESTERMAN,C    Chloride 101 98 - 111 mmol/L   CO2 24 22 - 32 mmol/L   Glucose, Bld 126 (H) 70 - 99 mg/dL    Comment: Glucose reference range applies only to samples taken after fasting for at least 8 hours.   BUN 26 (H) 6 - 20 mg/dL   Creatinine, Ser 8.93 (H) 0.61 - 1.24 mg/dL   Calcium 8.3 (L) 8.9 - 10.3 mg/dL   GFR, Estimated 36 (L) >60 mL/min    Comment: (NOTE) Calculated using the CKD-EPI Creatinine Equation (2021)    Anion gap 10 5 - 15    Comment: Performed at [facility] [place], [address] [place], [place] [address]  Lactic acid, plasma     Status: Abnormal   Collection Time: 10/25/20  6:54 AM  Result Value Ref Range  Lactic Acid, Venous 4.5 (HH) 0.5 - 1.9 mmol/L    Comment: CRITICAL RESULT CALLED TO, READ BACK BY AND VERIFIED WITH: HARRIS,B AT 7:15AM ON 10/25/20 BY [facility] Surgery CenterFESTERMAN,C Performed at [facility], [address] [place], [place] [id number]   Glucose, capillary     Status: Abnormal   Collection Time: 10/25/20  7:48 AM  Result Value Ref Range   Glucose-Capillary 151 (H) 70 - 99 mg/dL    Comment: Glucose reference range applies only to samples taken after fasting for at least 8 hours.   Comment 1 Notify RN      ROS:  Review of systems not obtained due to patient factors.  Physical Exam: Vitals:   10/25/20 0645 10/25/20 0745  BP: (!) 136/98   Pulse: 85   Resp: (!) 39   Temp:  (!) 101.1 F (38.4 C)  SpO2: 93%      General: sedated on vent HEENT: PERRLA, mucous membranes moist Neck: no JVD Heart: some tachy Lungs: mostly clear Abdomen: soft Extremities: minimal peripheral edema Skin: warm and dry Neuro: sedated on vent   Assessment/Plan: 60 year old WM with polysubstance abuse including cocaine and ETOH- presented with new found EF of 15%-  Decompensation overnight has led to AKI with hyperkalemia 1.Renal- baseline renal function normal-  Events last 24 hours with  hemodynamic instability and LR infusion - now with AKI and hyperkalemia.  Given that he is now anuric and K is already so high and crt has risen from 0.9 to over 2 I think will be very difficult to treat medically without RRT-  Given his instability will need CRRT  2. Hyperkalemia-  Due to AKI and LR as well as lactic acidosis-  Has received calcium, insulin/dextrose.  No access to give lokelma-  Will stop LR and give him bicarb infusion until CRRT can be started  3. Hypertension/volume  - hypotensive.  Does not seem that obviously overloaded-  Will not pull with CRRT-  May bolus outside of CRRT 4. Anemia  - not significant at this time   Cecille AverKellie A Turki Tapanes 10/25/2020, 8:44 AM

## 2020-10-25 NOTE — Consult Note (Signed)
NAMEIrwin Peck, MRN:  [id number], DOB:  12/17/1960, LOS: 4 ADMISSION DATE:  10/20/2020, CONSULTATION DATE:  10/25/2020  REFERRING MD:  Gwendolyn Lima, CHIEF COMPLAINT: Hypotension, post intubation  History of Present Illness:  60 year old EtOH and cocaine user admitted 5/18 with leg and abdominal swelling.  Echo showed EF of 15% with global hypokinesis, cardiology consultation advised cardiac cath after adequate medical management.  He was treated for alcohol withdrawal with Ativan and on 5/22 transferred to ICU and placed on Precedex but eventually required intubation .  He subsequently became hypotensive and required increasing Levophed and vasopressin through the night.  On 5/23, he was noted to have AKI & hyperkalemia, PCCM consulted  Pertinent  Medical History  ETOH use Marijuana and cocaine use UDS + cocaine   Significant Hospital Events: Including procedures, antibiotic start and stop dates in addition to other pertinent events   ETT 5/22 >> RIJ trialysis 5/23>>  Interim History / Subjective:    Objective   Blood pressure 98/80, pulse (!) 111, temperature (!) 101.4 F (38.6 C), temperature source Axillary, resp. rate (!) 24, height 5\' 7"  (1.702 m), weight 89.6 kg, SpO2 100 %.    Vent Mode: PRVC FiO2 (%):  [40 %-60 %] 50 % Set Rate:  [18 bmp] 18 bmp Vt Set:  [520 mL] 520 mL PEEP:  [5 cmH20] 5 cmH20 Plateau Pressure:  [15 cmH20-20 cmH20] 19 cmH20   Intake/Output Summary (Last 24 hours) at 10/25/2020 1131 Last data filed at 10/25/2020 1124 Gross per 24 hour  Intake 2069.95 ml  Output 500 ml  Net 1569.95 ml   Filed Weights   10/24/20 0601 10/24/20 1353 10/25/20 10/27/20  Weight: 90.3 kg 90.4 kg 89.6 kg    Examination: General: Acutely ill-appearing, intubated, intermittently agitated on Precedex drip HENT: Mild pallor, no icterus, no JVD Lungs: Clear breath sounds bilateral, on 50%/PEEP of 5, no accessory muscle use Cardiovascular: S1-S2 tacky, sinus on monitor Abdomen:  Soft, nontender, no organomegaly Extremities: Cool feet, no edema Neuro: Intermittent agitation, shaking head, on Precedex and Versed drips, not following commands, no meningeal signs GU: 100 cc urine output last 24 hours  Labs/imaging that I havepersonally reviewed  (right click and "Reselect all SmartList Selections" daily)  Labs show hyperkalemia, increase in creatinine to 2.1, normal bicarb, increased lactate 4.5, low procalcitonin, normal platelets Chest x-ray independently reviewed shows ET tube in position, right IJ in position, no new infiltrates, cardiomegaly  Resolved Hospital Problem list     Assessment & Plan:  Finding of new cardiomyopathy in this EtOH/cocaine user, intubated for severe EtOH withdrawal/agitated delirium and now hypotension and AKI with hyperkalemia  Acute respiratory failure -vent settings were reviewed and adjusted Check ABG  Acute encephalopathy/agitated delirium -Fentanyl 100 mcg given and added fentanyl drip, continue Versed drip, goal RASS 0 to -1. -Taper Precedex to off given hypotension and cardiomyopathy.  New onset cardiomyopathy -plan is for cardiac cath eventually once acute issues resolved. Obtain coox -if low may need inotropes  Hypotension -likely related to effect of Precedex and intubation with cardiomyopathy rather than septic shock.  Low procalcitonin is reassuring -Continue Levophed and vasopressin with goal MAP 65  AKI /hyperkalemia -Cardiorenal versus ATN related to hypotension in the last 12 hours -Potassium repeated and is down to 5.5, renal following, HD catheter placed but hopeful to avoid CRRT here   Best practice (right click and "Reselect all SmartList Selections" daily)  Diet:  Tube Feed  Pain/Anxiety/Delirium protocol (if indicated): Yes (RASS goal -  1) VAP protocol (if indicated): Yes DVT prophylaxis: Subcutaneous Heparin GI prophylaxis: PPI Glucose control:  SSI Yes Central venous access:  Yes, and it is still  needed Arterial line:  N/A Foley:  Yes, and it is still needed Mobility:  bed rest  PT consulted: N/A Last date of multidisciplinary goals of care discussion [NA] Code Status:  full code Disposition: ICU , transferred to [facility] in anticipation of CRRT, I spoke to Dr. Katrinka Blazing from critical care  Labs   CBC: Recent Labs  Lab 10/21/20 0033 10/21/20 0404 10/22/20 0434 10/25/20 0537  WBC 5.6 6.4 5.6 7.2  NEUTROABS 4.0  --   --   --   HGB 12.4* 12.7* 11.7* 13.8  HCT 42.0 42.6 38.2* 46.8  MCV 93.1 93.6 91.2 92.9  PLT 431* 435* 390 490*    Basic Metabolic Panel: Recent Labs  Lab 10/21/20 0404 10/22/20 0434 10/23/20 0503 10/24/20 0545 10/25/20 0537 10/25/20 0654 10/25/20 0947  NA 138 136 137 139 137 135 134*  K 4.3 3.6 4.2 4.5 6.8* 6.9* 5.5*  CL 103 99 100 103 101 101 104  CO2 27 28 28 28 24 24 22   GLUCOSE 104* 110* 80 110* 144* 126* 117*  BUN 20 22* 22* 20 26* 26* 28*  CREATININE 1.05 0.92 1.01 0.91 1.82* 2.07* 1.83*  CALCIUM 8.5* 8.3* 8.8* 8.9 8.4* 8.3* 8.0*  MG 2.1 1.9 2.0 1.8 2.1  --   --   PHOS 4.1  --   --   --   --   --   --    GFR: Estimated Creatinine Clearance: 45.8 mL/min (A) (by C-G formula based on SCr of 1.83 mg/dL (H)). Recent Labs  Lab 10/21/20 0033 10/21/20 0404 10/22/20 0434 10/25/20 0537 10/25/20 0654 10/25/20 0806 10/25/20 0947  PROCALCITON  --   --   --   --   --  0.46  --   WBC 5.6 6.4 5.6 7.2  --   --   --   LATICACIDVEN  --   --   --   --  4.5*  --  3.8*    Liver Function Tests: Recent Labs  Lab 10/21/20 0404 10/22/20 0434 10/23/20 0503 10/24/20 0545 10/25/20 0537  AST 86* 54* 44* 37 81*  ALT 167* 119* 109* 87* 97*  ALKPHOS 83 77 88 95 81  BILITOT 1.0 0.9 1.1 0.7 2.2*  PROT 6.4* 5.6* 6.5 6.5 6.8  ALBUMIN 3.4* 3.0* 3.4* 3.4* 3.5   No results for input(s): LIPASE, AMYLASE in the last 168 hours. Recent Labs  Lab 10/21/20 2114  AMMONIA 12    ABG    Component Value Date/Time   PHART 7.361 10/25/2020 0518   PCO2ART 44.2  10/25/2020 0518   PO2ART <31.0 (LL) 10/25/2020 0518   HCO3 21.9 10/25/2020 0518   ACIDBASEDEF 0.3 10/25/2020 0518   O2SAT 10.3 10/25/2020 0518     Coagulation Profile: Recent Labs  Lab 10/21/20 0404  INR 1.3*    Cardiac Enzymes: No results for input(s): CKTOTAL, CKMB, CKMBINDEX, TROPONINI in the last 168 hours.  HbA1C: No results found for: HGBA1C  CBG: Recent Labs  Lab 10/24/20 1958 10/25/20 0023 10/25/20 0435 10/25/20 0748 10/25/20 1128  GLUCAP 144* 133* 139* 151* 120*    Review of Systems:   Unable to obtain since unresponsive, intubated and sedated  Past Medical History:  He,  has a past medical history of Alcohol use, Arthritis, Marijuana use, and Tobacco abuse.   Surgical History:  Past Surgical History:  Procedure Laterality Date  . FRACTURE SURGERY     cheek      Social History:   reports that he has been smoking cigars and cigarettes. He has been smoking about 0.50 packs per day. He has never used smokeless tobacco. He reports current alcohol use of about 15.0 standard drinks of alcohol per week. He reports current drug use. Frequency: 2.00 times per week. Drug: Marijuana.   Family History:  His family history includes Congestive Heart Failure in his father and mother.   Allergies No Known Allergies   Home Medications  Prior to Admission medications   Medication Sig Start Date End Date Taking? Authorizing Provider  Famotidine (PEPCID PO) Take 1 tablet by mouth daily as needed (indigestion).   Yes [provider]     Critical care time: 76m      Cyril Mourning MD. FCCP. [facility] Pager : [phone]  If no response to pager , please call [phone] until 7 pm After 7:00 pm call Elink  [phone]   10/25/2020

## 2020-10-25 NOTE — Progress Notes (Signed)
   10/25/20 0248  Provider Notification  Provider Name/Title Frankey Shown, DO  Date Provider Notified 10/25/20  Time Provider Notified [phone]  Notification Type Face-to-face  Notification Reason Other (Comment) (Hypotension 76/46 (49))   Precedex currently infusing at 1 mcg/kg/hr.  Levophed currently infusing at max rate of 10 mcg/min. Versed currently infusing at max rate of 10 mg/hr.

## 2020-10-25 NOTE — Progress Notes (Signed)
eLink Physician-Brief Progress Note Patient Name: Kyle Peck DOB: 1960/12/22 MRN: [id number]   Date of Service  10/25/2020  HPI/Events of Note  Review of CXR by PCCM ground team. Both physicians feel that the L IJ CVL is OK to use.   eICU Interventions  OK to use L IJ CVL.     Intervention Category Major Interventions: Other:  [name] 10/25/2020, 8:43 PM

## 2020-10-25 NOTE — Procedures (Signed)
Central Venous Catheter Insertion Procedure Note  Kyle Peck  [id number]  [birth year]  Date:10/25/20  Time:11:46 AM   Provider Performing:Krist Rosenboom V. Vassie Loll   Procedure: Insertion of Non-tunneled Central Venous Catheter(36556)with US guidance (41324)    Indication(s) Hemodialysis  Consent Risks of the procedure as well as the alternatives and risks of each were explained to the patient and/or caregiver.  Consent for the procedure was obtained and is signed in the bedside chart  Anesthesia Topical only with 1% lidocaine   Timeout Verified patient identification, verified procedure, site/side was marked, verified correct patient position, special equipment/implants available, medications/allergies/relevant history reviewed, required imaging and test results available.  Sterile Technique Maximal sterile technique including full sterile barrier drape, hand hygiene, sterile gown, sterile gloves, mask, hair covering, sterile ultrasound probe cover (if used).  Procedure Description Area of catheter insertion was cleaned with chlorhexidine and draped in sterile fashion.   With real-time ultrasound guidance a HD catheter was placed into the right internal jugular vein.  Nonpulsatile blood flow and easy flushing noted in all ports.  The catheter was sutured in place and sterile dressing applied.  Complications/Tolerance None; patient tolerated the procedure well. Chest X-ray is ordered to verify placement for internal jugular or subclavian cannulation.  Chest x-ray is not ordered for femoral cannulation.  EBL Minimal  Specimen(s) None  Kyle Peck V. Vassie Loll MD

## 2020-10-25 NOTE — Progress Notes (Addendum)
2:59AM RN states that patient continues to be hypotensive with a BP of 76/46 (MAP 49) despite maxing out on Levophed.  Vasopressin was added.  Patient maxed out on vasopressin and was still hypotensive, so IV NS 250 mL x 1 other rate of 125 mls per hour was started.    5:40 AM BP improved to 132/50 (MAP 70), Precedex and vasopressin were titrated off, but the MAP dropped down to 40s and low 50s, so Levophed was titrated up to 8 with improvement in BP to 129/81 with a MAP of 98.  Unfortunately, ABG was unobtainable by the respiratory therapist. ECG was done and showed sinus rhythm with first-degree heart block with premature atrial complexes at a rate of 89 bpm. Chest x-ray was ordered and pending  6:40AM CMP showed K+ 6.8, a repeat BMP ordered.

## 2020-10-25 NOTE — Progress Notes (Signed)
Initial Nutrition Assessment  DOCUMENTATION CODES:   Obesity unspecified  INTERVENTION:  When patient is hemodynamically stable and if unable to wean- recommend:  Initiate Vital 1.2 @ 40 ml/hr via OGT and increase by 10 ml every 6 hours to goal rate of 60 ml/hr (1440 ml).   45 ml ProSource TF- TID per tube  Tube feeding regimen provides 1848 kcal, 141 gr protein, and  1691 ml of H2O.     NUTRITION DIAGNOSIS:   Inadequate oral intake related to inability to eat as evidenced by NPO status.   GOAL:  Provide needs based on ASPEN/SCCM guidelines   MONITOR:  Vent status,Labs,I & O's,TF tolerance,Weight trends  REASON FOR ASSESSMENT:   Ventilator    ASSESSMENT: Patient is a 60 yo who presents from home with acute systolic HF severe cardiomyopathy (EF-15%). Hx of alcohol/polysubstance abuse, cocaine positive.  Patient intubated last night due to respiratory failure. ETOH withdrawal, hypotensive, anuric. MAP 73 @1200 .    Discussed patient with nursing. No family at bedside.  Patient s/p placement of trialysis cath. He is being transferred out to [facility] or [facility] to receive CRRT.   Weight history reviewed.  Intake/Output Summary (Last 24 hours) at 10/25/2020 1423 Last data filed at 10/25/2020 1355 Gross per 24 hour  Intake 2204.1 ml  Output 500 ml  Net 1704.1 ml    MV: 9.6 L/min Temp (24hrs), Avg:99.2 F (37.3 C), Min:97.1 F (36.2 C), Max:101.4 F (38.6 C)  Medications reviewed and include: Precedex, Versed, Fentanyl, Levophed. Folic acid, MVI, Lokelma, Thiamine.  IVF- Sodium bicarbonate 150 mEq/D5% @ 100 ml/hr  Labs reviewed: BMP Latest Ref Rng & Units 10/25/2020 10/25/2020 10/25/2020  Glucose 70 - 99 mg/dL 10/27/2020) 081(K) 481(E)  BUN 6 - 20 mg/dL 563(J) 49(F) 02(O)  Creatinine 0.61 - 1.24 mg/dL 37(C) 5.88(F) 0.27(X)  Sodium 135 - 145 mmol/L 134(L) 135 137  Potassium 3.5 - 5.1 mmol/L 5.5(H) 6.9(HH) 6.8(HH)  Chloride 98 - 111 mmol/L 104 101 101  CO2 22 - 32 mmol/L  22 24 24   Calcium 8.9 - 10.3 mg/dL 8.0(L) 8.3(L) 8.4(L)     NUTRITION - FOCUSED PHYSICAL EXAM: Unable to complete Nutrition-Focused physical exam at this time.   Diet Order:   Diet Order            Diet NPO time specified  Diet effective now                 EDUCATION NEEDS:  Not appropriate for education at this time  Skin:  Skin Assessment: Reviewed RN Assessment  Last BM:  prior to admission, distended abdomen  Height:   Ht Readings from Last 1 Encounters:  10/25/20 5\' 7"  (1.702 m)    Weight:   Wt Readings from Last 1 Encounters:  10/25/20 89.6 kg    Ideal Body Weight:   67 kg  BMI:  Body mass index is 30.94 kg/m.  Estimated Nutritional Needs:   Kcal:  1882  Protein:  134-147 gr  Fluid:  >1800 ml   10/27/20 MS,RD,CSG,LDN CONTACT: [email or web address]

## 2020-10-25 NOTE — Progress Notes (Addendum)
Pharmacy Antibiotic Note  Kyle Peck is a 60 y.o. male admitted on 10/20/2020 with sepsis.  Pharmacy has been consulted for Vancomycin and Zosyn dosing.  Plan: Vancomycin 2000 mg IV x 1 dose. Vancomycin continuation dose pending CRRT plans Zosyn 3.375g IV every 8 hours. Monitor labs, c/s, and vanco level as indicated. Adjust antibiotics if CRRT initiated.   Height: 5\' 7"  (170.2 cm) Weight: 89.6 kg (197 lb 8.5 oz) IBW/kg (Calculated) : 66.1  Temp (24hrs), Avg:98.4 F (36.9 C), Min:97.1 F (36.2 C), Max:101.1 F (38.4 C)  Recent Labs  Lab 10/21/20 0033 10/21/20 0404 10/22/20 0434 10/23/20 0503 10/24/20 0545 10/25/20 0537 10/25/20 0654 10/25/20 0947  WBC 5.6 6.4 5.6  --   --  7.2  --   --   CREATININE 1.09 1.05 0.92 1.01 0.91 1.82* 2.07* 1.83*  LATICACIDVEN  --   --   --   --   --   --  4.5* 3.8*    Estimated Creatinine Clearance: 45.8 mL/min (A) (by C-G formula based on SCr of 1.83 mg/dL (H)).    No Known Allergies  Antimicrobials this admission: Vanco 5/23 >>  Zosyn 5/23 >>   Microbiology results: 5/23 BCx: pending  5/23 MRSA PCR: positive  Thank you for allowing pharmacy to be a part of this patient's care.  6/23 10/25/2020 10:51 AM

## 2020-10-25 NOTE — Progress Notes (Signed)
Progress Note  Patient Name: Kyle Peck Date of Encounter: 10/25/2020  [facility] Cardiologist:   Harl Bowie  Subjective   Pt intubated, sedated    Inpatient Medications    Scheduled Meds: . chlorhexidine gluconate (MEDLINE KIT)  15 mL Mouth Rinse BID  . Chlorhexidine Gluconate Cloth  6 each Topical Daily  . Chlorhexidine Gluconate Cloth  6 each Topical Q0600  . enoxaparin (LOVENOX) injection  40 mg Subcutaneous Q24H  . feeding supplement  237 mL Per Tube BID BM  . [START ON 3/43/5686] folic acid  1 mg Per Tube Daily  . hydrocortisone sod succinate (SOLU-CORTEF) inj  100 mg Intravenous Q8H  . mouth rinse  15 mL Mouth Rinse 10 times per day  . multivitamin  15 mL Per Tube Daily  . mupirocin ointment  1 application Nasal BID  . sodium zirconium cyclosilicate  10 g Per Tube TID  . [START ON 10/26/2020] thiamine  100 mg Per Tube Daily   Or  . [START ON 10/26/2020] thiamine  100 mg Intravenous Daily   Continuous Infusions: . sodium chloride    . dexmedetomidine (PRECEDEX) IV infusion Stopped (10/25/20 0415)  . fentaNYL infusion INTRAVENOUS 200 mcg/hr (10/25/20 1213)  . midazolam 10 mg/hr (10/25/20 1213)  . norepinephrine (LEVOPHED) Adult infusion 5 mcg/min (10/25/20 1213)  . piperacillin-tazobactam (ZOSYN)  IV    . sodium bicarbonate 150 mEq in D5W infusion 100 mL/hr at 10/25/20 1110  . [START ON 10/26/2020] vancomycin    . vasopressin 0.02 Units/min (10/25/20 1213)   PRN Meds: diphenhydrAMINE, haloperidol lactate, LORazepam **OR** LORazepam, ondansetron (ZOFRAN) IV   Vital Signs    Vitals:   10/25/20 1000 10/25/20 1100 10/25/20 1126 10/25/20 1200  BP: 107/82 98/80  (!) 88/67  Pulse: (!) 116 (!) 111  (!) 106  Resp: (!) 22 (!) 24  18  Temp:   (!) 101.4 F (38.6 C)   TempSrc:   Axillary   SpO2: 99% 100%  100%  Weight:      Height:        Intake/Output Summary (Last 24 hours) at 10/25/2020 1217 Last data filed at 10/25/2020 1213 Gross per 24 hour  Intake 2112.33  ml  Output 500 ml  Net 1612.33 ml   Last 3 Weights 10/25/2020 10/24/2020 10/24/2020  Weight (lbs) 197 lb 8.5 oz 199 lb 4.7 oz 199 lb 1.2 oz  Weight (kg) 89.6 kg 90.4 kg 90.3 kg      Telemetry    SR/ST  - Personally Reviewed  ECG     - Personally Reviewed  Physical Exam   GEN: Pt intubated    Neck:  Neck is full   Line in place   Cannot determine JVP   Cardiac: RRR, no murmurs    Tachy  Respiratory:  Mild wheezes and rhonchi bilaterally    GI: Soft, sl distended  Decreased BS   MS: No edema;  Feet are cool   No deformity. Neuro:  Nonfocal  Psych: Normal affect   Labs    High Sensitivity Troponin:  No results for input(s): TROPONINIHS in the last 720 hours.    Chemistry Recent Labs  Lab 10/23/20 0503 10/24/20 0545 10/25/20 0537 10/25/20 0654 10/25/20 0947  NA 137 139 137 135 134*  K 4.2 4.5 6.8* 6.9* 5.5*  CL 100 103 101 101 104  CO2 '28 28 24 24 22  ' GLUCOSE 80 110* 144* 126* 117*  BUN 22* 20 26* 26* 28*  CREATININE 1.01 0.91 1.82* 2.07*  1.83*  CALCIUM 8.8* 8.9 8.4* 8.3* 8.0*  PROT 6.5 6.5 6.8  --   --   ALBUMIN 3.4* 3.4* 3.5  --   --   AST 44* 37 81*  --   --   ALT 109* 87* 97*  --   --   ALKPHOS 88 95 81  --   --   BILITOT 1.1 0.7 2.2*  --   --   GFRNONAA >60 >60 42* 36* 42*  ANIONGAP '9 8 12 10 8     ' Hematology Recent Labs  Lab 10/21/20 0404 10/22/20 0434 10/25/20 0537  WBC 6.4 5.6 7.2  RBC 4.55 4.19* 5.04  HGB 12.7* 11.7* 13.8  HCT 42.6 38.2* 46.8  MCV 93.6 91.2 92.9  MCH 27.9 27.9 27.4  MCHC 29.8* 30.6 29.5*  RDW 16.9* 16.7* 17.2*  PLT 435* 390 490*    BNP Recent Labs  Lab 10/21/20 0033 10/23/20 0504  BNP 2,252.0* 1,685.0*     DDimer No results for input(s): DDIMER in the last 168 hours.   Radiology    DG Chest 1 View  Result Date: 10/25/2020 CLINICAL DATA:  Respiratory failure. EXAM: CHEST  1 VIEW COMPARISON:  10/24/2020 FINDINGS: Stable cardiac enlargement. Endotracheal tube remains with the tip approximately 3 cm above the  carina. Low bilateral lung volumes. There is no evidence of significant pulmonary edema, consolidation, pneumothorax or pleural fluid. IMPRESSION: Stable cardiac enlargement without overt pulmonary edema. Endotracheal tube remains. Low bilateral lung volumes. Electronically Signed   By: Aletta Edouard M.D.   On: 10/25/2020 08:45   DG CHEST PORT 1 VIEW  Result Date: 10/24/2020 CLINICAL DATA:  ETT and NG tube placement. EXAM: PORTABLE CHEST 1 VIEW COMPARISON:  Exam from Oct 21, 2020. FINDINGS: EKG leads project over the chest. Endotracheal tube tip between clavicular heads approximately 4.6 cm from the carina. Gastric tube coursing through in off the field of the radiograph below the LEFT hemidiaphragm. Cardiomediastinal contours remain enlarged Hilar structures are stable. Lungs are clear. On limited assessment no acute skeletal process. IMPRESSION: Support devices as described. No consolidation or effusion with cardiomegaly as before. Electronically Signed   By: Zetta Bills M.D.   On: 10/24/2020 17:02   DG Chest Port 1V same Day  Result Date: 10/25/2020 CLINICAL DATA:  Status post central line placement for dialysis. EXAM: PORTABLE CHEST 1 VIEW COMPARISON:  Single-view of the chest earlier today. FINDINGS: New double lumen right IJ approach dialysis catheter is in place. Tip of the catheter projects in the mid superior vena cava. ETT and NG tube are unchanged. No pneumothorax. Marked enlargement of the cardiopericardial silhouette again seen. Left basilar opacity is unchanged. IMPRESSION: New right IJ approach dialysis catheter tip projects in the mid superior vena cava. Negative for pneumothorax. No change in left basilar airspace opacity likely due to atelectasis. Marked enlargement of the cardiopericardial silhouette could be due to cardiomegaly and/or pericardial effusion. Electronically Signed   By: Inge Rise M.D.   On: 10/25/2020 09:48   [place] EKG SITE RITE  Result Date: 10/25/2020 If [facility] image not attached, placement could not be confirmed due to current cardiac rhythm.   Cardiac Studies   Echo   5/19//22  1. Left ventricular ejection fraction, by estimation, is 15%. The left  ventricle has severely decreased function. The left ventricle demonstrates  global hypokinesis. The left ventricular internal cavity size was  moderately dilated. Left ventricular  diastolic parameters are indeterminate.  2. Right ventricular systolic function  is mildly reduced. The right  ventricular size is moderately enlarged.  3. Left atrial size was severely dilated.  4. Right atrial size was mildly dilated.  5. The mitral valve is normal in structure. Mild mitral valve  regurgitation. No evidence of mitral stenosis.  6. The aortic valve has an indeterminant number of cusps. There is mild  calcification of the aortic valve. There is mild thickening of the aortic  valve. Aortic valve regurgitation is not visualized. No aortic stenosis is  present.  7. The inferior vena cava is dilated in size with <50% respiratory  variability, suggesting right atrial pressure of 15 mmHg.   Patient Profile   Kyle Peck is a 60 y.o. male with a hx of arthritis, ETOH use, tobacco use, THC use, and prior cocaine exposure who is being seen 10/22/2020 for the evaluation of acute systolic CHF at the request of Dr. Manuella Ghazi.  Assessment & Plan    1  Acute systolic CHF    Pt seen last week by Zandra Abts.   Currently intubated after becoming hypotensive and requiring intubation. Currently on Vasopresson and Levophed   CCM contacted   Plan for dialysis with renal failure and hyperkalemia Currently in ST   Some strips show accelerated junctional rhythm      Would order COOX to guide for possible inotropic support Agree with dialysis given if able REcomm for R and  L heart cath when able    2.  REnal   Continue to follow   Cr at baseline 0.92     3  Transaminitis    AST/ALT increased from last week    81/97   May relfect low flow state.      Note plans to transfer pt to [facility].    CHF team aware.      For questions or updates, please contact [place] Please consult [email or web address] for contact info under        Signed, Dorris Carnes, MD  10/25/2020, 12:17 PM

## 2020-10-25 NOTE — Procedures (Signed)
Central Venous Catheter Insertion Procedure Note  Kyle Peck  [id number]  1960-06-30  Date:10/25/20  Time:6:48 PM   Provider Performing:[name]   Procedure: Insertion of Non-tunneled Central Venous [phone]) with US guidance ([id number])   Indication(s) Medication administration  Consent Risks of the procedure as well as the alternatives and risks of each were explained to the patient and/or caregiver.  Consent for the procedure was obtained and is signed in the bedside chart  Anesthesia Topical only with 1% lidocaine   Timeout Verified patient identification, verified procedure, site/side was marked, verified correct patient position, special equipment/implants available, medications/allergies/relevant history reviewed, required imaging and test results available.  Sterile Technique Maximal sterile technique including full sterile barrier drape, hand hygiene, sterile gown, sterile gloves, mask, hair covering, sterile ultrasound probe cover (if used).  Procedure Description Area of catheter insertion was cleaned with chlorhexidine and draped in sterile fashion.  With real-time ultrasound guidance a central venous catheter was placed into the left internal jugular vein. Nonpulsatile blood flow and easy flushing noted in all ports.  The catheter was sutured in place and sterile dressing applied.  Complications/Tolerance None; patient tolerated the procedure well. Chest X-ray is ordered to verify placement for internal jugular or subclavian cannulation.   Chest x-ray is not ordered for femoral cannulation.  EBL Minimal  Specimen(s) None  [name], MSN, APRN, AGACNP-BC [facility]  10/25/2020 , 6:49 PM  Please see [email or web address] for pager details  If no response, please call [phone] After hours, please call Elink at [phone]

## 2020-10-25 NOTE — Consult Note (Addendum)
Advanced Heart Failure Team Consult Note   Primary Physician: Pcp, No PCP-Cardiologist:  None  Reason for Consultation: Acute Systolic Heart Failure   HPI:    Kyle Peck is seen today for evaluation of acute systolic heart failure at the request of Dr Harrington Challenger.   Kyle Peck is a 60 year old with a history of arthritis, ETOH abuse, tobacco abuse, THC use, and cocaine abuse. DO not mention substance abuse in front of his wife daughter. Drinking liquor daily  Preseented to [facility] on 5/19 with progressive shortness of breath and fatigue. CXR concerning for vascular edema. BNP 2252, SARs negaitve. ECHO showed severely reduced EF 15% and mildly reduced RV. Cardioogy consulted. Started on IV lasix.   Yesterday lethargic with respiratory failure. ? ETOH withdrawal. Required intubation 10/24/20. Earlier this morning hypotensive requiring norepi and vasopressin. Given 250 cc bolus then started on IV fluids at 125 cc per hour. CCM consulted. Pertinent labs today: Lactic Acid 4.5>3.8, K 5.5, creatinine 1.8, procalcitonin 0.46, hgb 13. Minimal urine output.   Echo 10/22/31 1. Left ventricular ejection fraction, by estimation, is 15%. The left  ventricle has severely decreased function. The left ventricle demonstrates  global hypokinesis. The left ventricular internal cavity size was  moderately dilated. Left ventricular  diastolic parameters are indeterminate.  2. Right ventricular systolic function is mildly reduced. The right  ventricular size is moderately enlarged.  3. Left atrial size was severely dilated.  4. Right atrial size was mildly dilated.  5. The mitral valve is normal in structure. Mild mitral valve  regurgitation. No evidence of mitral stenosis.  6. The aortic valve has an indeterminant number of cusps. There is mild  calcification of the aortic valve. There is mild thickening of the aortic  valve. Aortic valve regurgitation is not visualized. No aortic stenosis is  present.   7. The inferior vena cava is dilated in size with <50% respiratory  variability, suggesting right atrial pressure of 15 mmHg.   Review of Systems: [y] = yes, '[ ]'  = no  Patient is encephalopathic and or intubated. Therefore history has been obtained from chart review.   . General: Weight gain '[ ]' ; Weight loss '[ ]' ; Anorexia '[ ]' ; Fatigue '[ ]' ; Fever '[ ]' ; Chills '[ ]' ; Weakness '[ ]'   . Cardiac: Chest pain/pressure '[ ]' ; Resting SOB '[ ]' ; Exertional SOB '[ ]' ; Orthopnea '[ ]' ; Pedal Edema '[ ]' ; Palpitations '[ ]' ; Syncope '[ ]' ; Presyncope '[ ]' ; Paroxysmal nocturnal dyspnea'[ ]'   . Pulmonary: Cough '[ ]' ; Wheezing'[ ]' ; Hemoptysis'[ ]' ; Sputum '[ ]' ; Snoring '[ ]'   . GI: Vomiting'[ ]' ; Dysphagia'[ ]' ; Melena'[ ]' ; Hematochezia '[ ]' ; Heartburn'[ ]' ; Abdominal pain '[ ]' ; Constipation '[ ]' ; Diarrhea '[ ]' ; BRBPR '[ ]'   . GU: Hematuria'[ ]' ; Dysuria '[ ]' ; Nocturia'[ ]'   . Vascular: Pain in legs with walking '[ ]' ; Pain in feet with lying flat '[ ]' ; Non-healing sores '[ ]' ; Stroke '[ ]' ; TIA '[ ]' ; Slurred speech '[ ]' ;  . Neuro: Headaches'[ ]' ; Vertigo'[ ]' ; Seizures'[ ]' ; Paresthesias'[ ]' ;Blurred vision '[ ]' ; Diplopia '[ ]' ; Vision changes '[ ]'   . Ortho/Skin: Arthritis '[ ]' ; Joint pain '[ ]' ; Muscle pain '[ ]' ; Joint swelling '[ ]' ; Back Pain '[ ]' ; Rash '[ ]'   . Psych: Depression'[ ]' ; Anxiety'[ ]'   . Heme: Bleeding problems '[ ]' ; Clotting disorders '[ ]' ; Anemia '[ ]'   . Endocrine: Diabetes '[ ]' ; Thyroid dysfunction'[ ]'   Home Medications Prior to Admission medications   Medication Sig  Start Date End Date Taking? Authorizing Provider  Famotidine (PEPCID PO) Take 1 tablet by mouth daily as needed (indigestion).   Yes [provider]    Past Medical History: Past Medical History:  Diagnosis Date  . Alcohol use   . Arthritis   . Marijuana use   . Tobacco abuse     Past Surgical History: Past Surgical History:  Procedure Laterality Date  . FRACTURE SURGERY     cheek     Family History: Family History  Problem Relation Age of Onset  . Congestive Heart Failure  Mother   . Congestive Heart Failure Father     Social History: Social History   Socioeconomic History  . Marital status: Married    Spouse name: [name]  . Number of children: Not on file  . Years of education: Not on file  . Highest education level: Not on file  Occupational History  . Not on file  Tobacco Use  . Smoking status: Current Some Day Smoker    Packs/day: 0.50    Types: Cigars, Cigarettes  . Smokeless tobacco: Never Used  Vaping Use  . Vaping Use: Never used  Substance and Sexual Activity  . Alcohol use: Yes    Alcohol/week: 15.0 standard drinks    Types: 15 Shots of liquor per week    Comment: intermittently  . Drug use: Yes    Frequency: 2.0 times per week    Types: Marijuana    Comment: "also around cocaine before"  . Sexual activity: Not on file  Other Topics Concern  . Not on file  Social History Narrative  . Not on file   Social Determinants of Health   Financial Resource Strain: Not on file  Food Insecurity: Not on file  Transportation Needs: Not on file  Physical Activity: Not on file  Stress: Not on file  Social Connections: Not on file    Allergies:  No Known Allergies  Objective:    Vital Signs:   Temp:  [97.8 F (36.6 C)-101.4 F (38.6 C)] 100 F (37.8 C) (05/23 1251) Pulse Rate:  [34-121] 96 (05/23 1530) Resp:  [15-41] 18 (05/23 1600) BP: (57-223)/(28-182) 121/85 (05/23 1530) SpO2:  [91 %-100 %] 100 % (05/23 1530) FiO2 (%):  [40 %-60 %] 50 % (05/23 1237) Weight:  [89.6 kg] 89.6 kg (05/23 0608)    Weight change: Filed Weights   10/24/20 0601 10/24/20 1353 10/25/20 0608  Weight: 90.3 kg 90.4 kg 89.6 kg    Intake/Output:   Intake/Output Summary (Last 24 hours) at 10/25/2020 1654 Last data filed at 10/25/2020 1600 Gross per 24 hour  Intake 2335.39 ml  Output 650 ml  Net 1685.39 ml      Physical Exam    General:  Intubated/sedated  HEENT:ETT  Neck: supple. JVP difficult to assess. Carotids 2+ bilat; no bruits. No  lymphadenopathy or thyromegaly appreciated. Cor: PMI nondisplaced. Regular rate & rhythm. No rubs, gallops or murmurs. Lungs: clear Abdomen: soft, nontender, nondistended. No hepatosplenomegaly. No bruits or masses. Good bowel sounds. Extremities: cool, no cyanosis, clubbing, rash, edema Neuro: Intubated/sedated GU: Foley    Telemetry   SR 90 with occasional PVCs.   EKG    EKG on admit Sinus Tach 106 bpm  Labs   Basic Metabolic Panel: Recent Labs  Lab 10/21/20 0404 10/22/20 0434 10/23/20 0503 10/24/20 0545 10/25/20 0537 10/25/20 0654 10/25/20 0947  NA 138 136 137 139 137 135 134*  K 4.3 3.6 4.2 4.5 6.8* 6.9* 5.5*  CL 103 99 100 103 101 101 104  CO2 '27 28 28 28 24 24 22  ' GLUCOSE 104* 110* 80 110* 144* 126* 117*  BUN 20 22* 22* 20 26* 26* 28*  CREATININE 1.05 0.92 1.01 0.91 1.82* 2.07* 1.83*  CALCIUM 8.5* 8.3* 8.8* 8.9 8.4* 8.3* 8.0*  MG 2.1 1.9 2.0 1.8 2.1  --   --   PHOS 4.1  --   --   --   --   --   --     Liver Function Tests: Recent Labs  Lab 10/21/20 0404 10/22/20 0434 10/23/20 0503 10/24/20 0545 10/25/20 0537  AST 86* 54* 44* 37 81*  ALT 167* 119* 109* 87* 97*  ALKPHOS 83 77 88 95 81  BILITOT 1.0 0.9 1.1 0.7 2.2*  PROT 6.4* 5.6* 6.5 6.5 6.8  ALBUMIN 3.4* 3.0* 3.4* 3.4* 3.5   No results for input(s): LIPASE, AMYLASE in the last 168 hours. Recent Labs  Lab 10/21/20 2114  AMMONIA 12    CBC: Recent Labs  Lab 10/21/20 0033 10/21/20 0404 10/22/20 0434 10/25/20 0537  WBC 5.6 6.4 5.6 7.2  NEUTROABS 4.0  --   --   --   HGB 12.4* 12.7* 11.7* 13.8  HCT 42.0 42.6 38.2* 46.8  MCV 93.1 93.6 91.2 92.9  PLT 431* 435* 390 490*    Cardiac Enzymes: No results for input(s): CKTOTAL, CKMB, CKMBINDEX, TROPONINI in the last 168 hours.  BNP: BNP (last 3 results) Recent Labs    10/21/20 0033 10/23/20 0504  BNP 2,252.0* 1,685.0*    ProBNP (last 3 results) No results for input(s): PROBNP in the last 8760 hours.   CBG: Recent Labs  Lab  10/24/20 1958 10/25/20 0023 10/25/20 0435 10/25/20 0748 10/25/20 1128  GLUCAP 144* 133* 139* 151* 120*    Coagulation Studies: No results for input(s): LABPROT, INR in the last 72 hours.   Imaging   DG Chest 1 View  Result Date: 10/25/2020 CLINICAL DATA:  Respiratory failure. EXAM: CHEST  1 VIEW COMPARISON:  10/24/2020 FINDINGS: Stable cardiac enlargement. Endotracheal tube remains with the tip approximately 3 cm above the carina. Low bilateral lung volumes. There is no evidence of significant pulmonary edema, consolidation, pneumothorax or pleural fluid. IMPRESSION: Stable cardiac enlargement without overt pulmonary edema. Endotracheal tube remains. Low bilateral lung volumes. Electronically Signed   By: Aletta Edouard M.D.   On: 10/25/2020 08:45   DG Chest Port 1V same Day  Result Date: 10/25/2020 CLINICAL DATA:  Status post central line placement for dialysis. EXAM: PORTABLE CHEST 1 VIEW COMPARISON:  Single-view of the chest earlier today. FINDINGS: New double lumen right IJ approach dialysis catheter is in place. Tip of the catheter projects in the mid superior vena cava. ETT and NG tube are unchanged. No pneumothorax. Marked enlargement of the cardiopericardial silhouette again seen. Left basilar opacity is unchanged. IMPRESSION: New right IJ approach dialysis catheter tip projects in the mid superior vena cava. Negative for pneumothorax. No change in left basilar airspace opacity likely due to atelectasis. Marked enlargement of the cardiopericardial silhouette could be due to cardiomegaly and/or pericardial effusion. Electronically Signed   By: Inge Rise M.D.   On: 10/25/2020 09:48   [place] EKG SITE RITE  Result Date: 10/25/2020 If [facility] image not attached, placement could not be confirmed due to current cardiac rhythm.     Medications:     Current Medications: . chlorhexidine gluconate (MEDLINE KIT)  15 mL Mouth Rinse BID  . Chlorhexidine Gluconate  Cloth  6 each  Topical [id number]  . enoxaparin (LOVENOX) injection  40 mg Subcutaneous Q24H  . feeding supplement  237 mL Per Tube BID BM  . [START ON 3/41/9622] folic acid  1 mg Per Tube Daily  . hydrocortisone sod succinate (SOLU-CORTEF) inj  100 mg Intravenous Q8H  . mouth rinse  15 mL Mouth Rinse 10 times per day  . multivitamin  15 mL Per Tube Daily  . mupirocin ointment  1 application Nasal BID  . sodium zirconium cyclosilicate  10 g Per Tube TID  . [START ON 10/26/2020] thiamine  100 mg Per Tube Daily   Or  . [START ON 10/26/2020] thiamine  100 mg Intravenous Daily  . vancomycin variable dose per unstable renal function (pharmacist dosing)   Does not apply See admin instructions     Infusions: . sodium chloride    . dexmedetomidine (PRECEDEX) IV infusion Stopped (10/25/20 0415)  . fentaNYL infusion INTRAVENOUS 150 mcg/hr (10/25/20 1600)  . midazolam 4 mg/hr (10/25/20 1600)  . norepinephrine (LEVOPHED) Adult infusion 10 mcg/min (10/25/20 1600)  . piperacillin-tazobactam (ZOSYN)  IV 12.5 mL/hr at 10/25/20 1600  . sodium bicarbonate 150 mEq in D5W infusion 100 mL/hr at 10/25/20 1110  . vasopressin Stopped (10/25/20 1442)        Assessment/Plan   1. Shock -Suspect Cardiogenic  -Procalcitonin 0.46.  -Lactic Acid 4.5 >3.8  -Blood Cx pending -On Norepi at 10 mcg. CO-OX 51% Add dobutamine 2.5 mcg.    2. Acute Systolic HF -? ETOH induced. Check HS Trop. If improves will need cath.  -Echo EF 15% with mild RV failure -Starting on CVVHD - See above.   3. Acute Hypoxic Respiratory Failure -Intubated 5/22 / CCM managing.   4. AKI -Creatinine on admit 0.9-->1.8 today  -Nephrology following. Starting CVVHD   5. Hyperkalemia  -6.9>6.8>5.5  -Given insulin/dextrose/lokelma  - As above.   6. Polysubstance Abuse -UDS + cocaine, benzo, and THC  7. ETOH Abuse -Started withdrawing 5/21.Given ativan   Length of Stay: 4  Amy Clegg, NP  10/25/2020, 4:54 PM  Advanced Heart Failure  Team Pager [phone] (M-F; 7a - 5p)  Please contact [facility] for night-coverage after hours (4p -7a ) and weekends on [email or web address]  Patient seen with NP, agree with the above note.   He is currently intubated on NE 3 with SBP 100s.  Intubated, on Versed/Fentanyl.  In NSR.    He has had intractable hyperkalemia and rising creatinine with minimal UOP, was sent from [facility] to initiate CVVH (renal following).  Most recent K 6.0 with creatinine up to 2.09. Lactate 3.8, co-ox 51%.   General: Intubated/sedated Neck: Thick, JVP difficult, no thyromegaly or thyroid nodule.  Lungs: Clear to auscultation bilaterally with normal respiratory effort. CV: Nondisplaced PMI.  Heart regular S1/S2, no S3/S4, no murmur.  No peripheral edema.  No carotid bruit. Difficult to palpate pedal pulses.  Abdomen: Soft, nontender, no hepatosplenomegaly, no distention.  Skin: Intact without lesions or rashes.  Neurologic: Sedated on vent.  Extremities: No clubbing or cyanosis.  HEENT: Normal.   1. Shock: Suspect cardiogenic with sedation contributing (pressors started when intubated due altered mental status with ETOH withdrawal).  - Currently on NE 3 with stable MAP.  2. Acute systolic CHF: Echo this admission with EF 15%, moderate LV dilation, mildly decreased RV function, mild Kyle.  Cause uncertain, could be due to ETOH abuse but cannot rule out CAD. Presentation consistent with CHF, not ACS.  HS-TnI in  setting of hypotension/shock was 204.  - Will place CVL to follow CVP and co-ox.  - With initial co-ox from HD catheter 51%, will start dobutamine 2.5.  - Continue NE, titrate based on MAP.  - CVVH to be started, UF will be based on CVP.  - Ideally would eventually get right/left cath, will depend on trajectory of renal function.  3. AKI: Creatinine up to 2.09 with persistent hyperkalemia and minimal UOP.  Patient was transferred from [facility] to start CVVH, renal following closely.  Has HD catheter.  4.  ETOH abuse: With withdrawal.  5. Cocaine abuse: Apparently this is not chronic.  6. Acute hypoxemic respiratory failure: Initially in setting of ETOH withdrawal and altered mental status.  - Vent per CCM.   Loralie Champagne 10/25/2020

## 2020-10-26 ENCOUNTER — Inpatient Hospital Stay (HOSPITAL_COMMUNITY): Payer: Self-pay

## 2020-10-26 DIAGNOSIS — E875 Hyperkalemia: Secondary | ICD-10-CM

## 2020-10-26 DIAGNOSIS — F1910 Other psychoactive substance abuse, uncomplicated: Secondary | ICD-10-CM

## 2020-10-26 DIAGNOSIS — N179 Acute kidney failure, unspecified: Secondary | ICD-10-CM

## 2020-10-26 DIAGNOSIS — G9340 Encephalopathy, unspecified: Secondary | ICD-10-CM

## 2020-10-26 DIAGNOSIS — R7989 Other specified abnormal findings of blood chemistry: Secondary | ICD-10-CM

## 2020-10-26 DIAGNOSIS — R7881 Bacteremia: Secondary | ICD-10-CM

## 2020-10-26 DIAGNOSIS — F1010 Alcohol abuse, uncomplicated: Secondary | ICD-10-CM

## 2020-10-26 DIAGNOSIS — R570 Cardiogenic shock: Secondary | ICD-10-CM

## 2020-10-26 DIAGNOSIS — J9600 Acute respiratory failure, unspecified whether with hypoxia or hypercapnia: Secondary | ICD-10-CM

## 2020-10-26 LAB — ECHOCARDIOGRAM LIMITED
Height: 67 in
Weight: 3382.74 oz

## 2020-10-26 LAB — CBC
HCT: 38.7 % — ABNORMAL LOW (ref 39.0–52.0)
Hemoglobin: 12.1 g/dL — ABNORMAL LOW (ref 13.0–17.0)
MCH: 27.9 pg (ref 26.0–34.0)
MCHC: 31.3 g/dL (ref 30.0–36.0)
MCV: 89.2 fL (ref 80.0–100.0)
Platelets: 280 10*3/uL (ref 150–400)
RBC: 4.34 MIL/uL (ref 4.22–5.81)
RDW: 16.6 % — ABNORMAL HIGH (ref 11.5–15.5)
WBC: 6.7 10*3/uL (ref 4.0–10.5)
nRBC: 0 % (ref 0.0–0.2)

## 2020-10-26 LAB — PHOSPHORUS: Phosphorus: 4.3 mg/dL (ref 2.5–4.6)

## 2020-10-26 LAB — BLOOD CULTURE ID PANEL (REFLEXED) - BCID2

## 2020-10-26 LAB — PROCALCITONIN: Procalcitonin: 1.52 ng/mL

## 2020-10-26 LAB — POCT I-STAT 7, (LYTES, BLD GAS, ICA,H+H)
Acid-Base Excess: 3 mmol/L — ABNORMAL HIGH (ref 0.0–2.0)
Bicarbonate: 27.9 mmol/L (ref 20.0–28.0)
Calcium, Ion: 1.19 mmol/L (ref 1.15–1.40)
HCT: 38 % — ABNORMAL LOW (ref 39.0–52.0)
Hemoglobin: 12.9 g/dL — ABNORMAL LOW (ref 13.0–17.0)
O2 Saturation: 96 %
Patient temperature: 98
Potassium: 4 mmol/L (ref 3.5–5.1)
Sodium: 139 mmol/L (ref 135–145)
TCO2: 29 mmol/L (ref 22–32)
pCO2 arterial: 42.6 mmHg (ref 32.0–48.0)
pH, Arterial: 7.422 (ref 7.350–7.450)
pO2, Arterial: 80 mmHg — ABNORMAL LOW (ref 83.0–108.0)

## 2020-10-26 LAB — COMPREHENSIVE METABOLIC PANEL
ALT: 335 U/L — ABNORMAL HIGH (ref 0–44)
AST: 339 U/L — ABNORMAL HIGH (ref 15–41)
Albumin: 2.5 g/dL — ABNORMAL LOW (ref 3.5–5.0)
Alkaline Phosphatase: 54 U/L (ref 38–126)
Anion gap: 7 (ref 5–15)
BUN: 24 mg/dL — ABNORMAL HIGH (ref 6–20)
CO2: 28 mmol/L (ref 22–32)
Calcium: 8 mg/dL — ABNORMAL LOW (ref 8.9–10.3)
Chloride: 102 mmol/L (ref 98–111)
Creatinine, Ser: 1.4 mg/dL — ABNORMAL HIGH (ref 0.61–1.24)
GFR, Estimated: 58 mL/min — ABNORMAL LOW (ref >60)
Glucose, Bld: 168 mg/dL — ABNORMAL HIGH (ref 70–99)
Potassium: 4.1 mmol/L (ref 3.5–5.1)
Sodium: 137 mmol/L (ref 135–145)
Total Bilirubin: 1.7 mg/dL — ABNORMAL HIGH (ref 0.3–1.2)
Total Protein: 5.3 g/dL — ABNORMAL LOW (ref 6.5–8.1)

## 2020-10-26 LAB — RENAL FUNCTION PANEL
Albumin: 2.7 g/dL — ABNORMAL LOW (ref 3.5–5.0)
Anion gap: 5 (ref 5–15)
BUN: 23 mg/dL — ABNORMAL HIGH (ref 6–20)
CO2: 27 mmol/L (ref 22–32)
Calcium: 8 mg/dL — ABNORMAL LOW (ref 8.9–10.3)
Chloride: 103 mmol/L (ref 98–111)
Creatinine, Ser: 1.09 mg/dL (ref 0.61–1.24)
GFR, Estimated: >60 mL/min (ref >60)
Glucose, Bld: 151 mg/dL — ABNORMAL HIGH (ref 70–99)
Phosphorus: 3.2 mg/dL (ref 2.5–4.6)
Potassium: 4.6 mmol/L (ref 3.5–5.1)
Sodium: 135 mmol/L (ref 135–145)

## 2020-10-26 LAB — GLUCOSE, CAPILLARY
Glucose-Capillary: 151 mg/dL — ABNORMAL HIGH (ref 70–99)
Glucose-Capillary: 157 mg/dL — ABNORMAL HIGH (ref 70–99)
Glucose-Capillary: 176 mg/dL — ABNORMAL HIGH (ref 70–99)
Glucose-Capillary: 154 mg/dL — ABNORMAL HIGH (ref 70–99)
Glucose-Capillary: 146 mg/dL — ABNORMAL HIGH (ref 70–99)

## 2020-10-26 LAB — MAGNESIUM: Magnesium: 2 mg/dL (ref 1.7–2.4)

## 2020-10-26 LAB — VANCOMYCIN, RANDOM: Vancomycin Rm: 10

## 2020-10-26 LAB — COOXEMETRY PANEL
Carboxyhemoglobin: 1.3 % (ref 0.5–1.5)
Methemoglobin: 0.6 % (ref 0.0–1.5)
O2 Saturation: 74.3 %
Total hemoglobin: 12.4 g/dL (ref 12.0–16.0)

## 2020-10-26 LAB — LACTIC ACID, PLASMA: Lactic Acid, Venous: 1.6 mmol/L (ref 0.5–1.9)

## 2020-10-26 MED ORDER — B COMPLEX-C PO TABS
1.0000 | ORAL_TABLET | Freq: Every day | ORAL | Status: DC
  Administered 2020-10-26 – 2020-10-28 (×3): 1
  Filled 2020-10-26 (×3): qty 1

## 2020-10-26 MED ORDER — PROSOURCE TF PO LIQD
45.0000 mL | Freq: Four times a day (QID) | ORAL | Status: DC
  Administered 2020-10-26 – 2020-10-30 (×18): 45 mL
  Filled 2020-10-26 (×18): qty 45

## 2020-10-26 MED ORDER — VITAL 1.5 CAL PO LIQD
1000.0000 mL | ORAL | Status: DC
  Administered 2020-10-26 – 2020-10-30 (×6): 1000 mL

## 2020-10-26 MED ORDER — VANCOMYCIN HCL 1000 MG/200ML IV SOLN
1000.0000 mg | INTRAVENOUS | Status: DC
  Administered 2020-10-26 – 2020-10-29 (×4): 1000 mg via INTRAVENOUS
  Filled 2020-10-26 (×4): qty 200

## 2020-10-26 MED ORDER — PRISMASOL BGK 4/2.5 32-4-2.5 MEQ/L REPLACEMENT SOLN: Status: DC

## 2020-10-26 MED ORDER — CLONAZEPAM 0.5 MG PO TABS
1.0000 mg | ORAL_TABLET | Freq: Two times a day (BID) | ORAL | Status: DC
  Administered 2020-10-26 – 2020-10-28 (×6): 1 mg
  Filled 2020-10-26 (×6): qty 2

## 2020-10-26 MED ORDER — MIDAZOLAM 50MG/50ML (1MG/ML) PREMIX INFUSION
0.0000 mg/h | INTRAVENOUS | Status: DC
  Administered 2020-10-26: 5 mg/h via INTRAVENOUS
  Administered 2020-10-27: 2 mg/h via INTRAVENOUS
  Administered 2020-10-27: 5 mg/h via INTRAVENOUS
  Administered 2020-10-29: 2 mg/h via INTRAVENOUS
  Administered 2020-10-29: 1 mg/h via INTRAVENOUS
  Administered 2020-10-30: 2 mg/h via INTRAVENOUS
  Filled 2020-10-26 (×5): qty 50

## 2020-10-26 NOTE — Progress Notes (Signed)
  Blood CX- MRSA  TEE set up for tomorrow by Dr Shirlee Latch.   Kenley Rettinger NP-C 9:03 AM

## 2020-10-26 NOTE — Progress Notes (Signed)
[facility] Infectious Diseases Follow Up Note  Patient Identification: Patient Name: Kyle Peck MRN: [id number] Admit Date: 10/20/2020 11:52 PM Age: 60 y.o.Today's Date: 10/26/2020   Reason for Visit: MRSA bacteremia  Principal Problem:   Elevated brain natriuretic peptide (BNP) level Active Problems:   CHF (congestive heart failure) (HCC)   Transaminitis   Thrombocytosis   Hypoalbuminemia due to protein-calorie malnutrition (HCC)   Tobacco use   Pressure injury of skin   Acute respiratory failure (HCC)   Cardiogenic shock (HCC)   Hyperkalemia   Acute renal failure (ARF) (HCC)   Acute encephalopathy   Polysubstance abuse (HCC)   ETOH abuse   Antibiotics: Vancomycin 5/18-current                    Gentamicin 5/20-current   Lines/Tubes: Hemodialysis IJ catheter, left IJ catheter, PIV's   Interval Events: Fevers has resolved , no leukocytosism on low dose pressors, Still; intubated and sedated. Chest Xray with bilateral interstitial edema   Assessment MRSA bacteremia?  Possibly nosocomial-TTE negative for vegetations  Acute systolic CHF/Volume Overload  Cardiogenic and septic shock-on pressors Acute respiratory failure status post intubation AKI on CRRT  Transaminitis-in the setting of shock , acute hep panel negative, ultrasound right upper quadrant unremarkable Polysubstance abuse Alcohol withdrawal  Sacral ulcer   Recommendations Continue Vancomycin, pharamcy to dose.  Fu repeat blood cultures from today  TEE today  Metastatic work up for staph bacteremia when able  Management of alcohol withdrawal per primary Contact precautions  Following    Rest of the management as per the primary team. Thank you for the consult. Please page with pertinent questions or concerns.  ______________________________________________________________________ Subjective patient seen and examined at the bedside. Intubated  and sedated    Vitals BP (!) 87/61   Pulse 86   Temp (!) 96.98 F (36.1 C)   Resp 18   Ht 5\' 7"  (1.702 m)   Wt 90.1 kg   SpO2 99%   BMI 31.11 kg/m     Physical Exam Constitutional:  Intubated and sedated     Comments: atraumatic and normocephalic   Cardiovascular:     Rate and Rhythm: Normal rate and regular rhythm.     Heart sounds:    Pulmonary:     Effort: Loud vent sounds    Comments:   Abdominal:     Palpations: Abdomen is soft.     Tenderness: non distended and non tender  Musculoskeletal:        General: No obvious swelling   Skin:    Comments: No lesions or rashes   Neurological:     General: unable to assess, back not examined  Psychiatric:        Mood and Affect: unable to assess    Pertinent Microbiology Results for orders placed or performed during the hospital encounter of 10/20/20  SARS CORONAVIRUS 2 (TAT 6-24 HRS) Nasopharyngeal Nasopharyngeal Swab     Status: None   Collection Time: 10/21/20  2:00 AM   Specimen: Nasopharyngeal Swab  Result Value Ref Range Status   SARS Coronavirus 2 NEGATIVE NEGATIVE Final    Comment: (NOTE) SARS-CoV-2 target nucleic acids are NOT DETECTED.  The SARS-CoV-2 RNA is generally detectable in upper and lower respiratory specimens during the acute phase of infection. Negative results do not preclude SARS-CoV-2 infection, do not rule out co-infections with other pathogens, and should not be used as the sole basis for treatment or other patient management decisions. Negative results must  be combined with clinical observations, patient history, and epidemiological information. The expected result is Negative.  Fact Sheet for Patients: HairSlick.no  Fact Sheet for Healthcare Providers: quierodirigir.com  This test is not yet approved or cleared by the [place] FDA and  has been authorized for detection and/or diagnosis of SARS-CoV-2 by FDA under an  Emergency Use Authorization (EUA). This EUA will remain  in effect (meaning this test can be used) for the duration of the COVID-19 declaration under Se ction 564(b)(1) of the Act, 21 U.S.C. section 360bbb-3(b)(1), unless the authorization is terminated or revoked sooner.  Performed at [facility], [address], [place], [place] [address]   MRSA PCR Screening     Status: Abnormal   Collection Time: 10/25/20  6:30 AM   Specimen: Nasal Mucosa; Nasopharyngeal  Result Value Ref Range Status   MRSA by PCR POSITIVE (A) NEGATIVE Final    Comment:        The GeneXpert MRSA Assay (FDA approved for NASAL specimens only), is one component of a comprehensive MRSA colonization surveillance program. It is not intended to diagnose MRSA infection nor to guide or monitor treatment for MRSA infections. RESULT CALLED TO, READ BACK BY AND VERIFIED WITH: FOLEY,B. AT 0845 BY HUFFINES,S ON 10/25/20. Performed at [facility], [address], [place], [place] [address]   Culture, blood (routine x 2)     Status: Abnormal (Preliminary result)   Collection Time: 10/25/20  8:06 AM   Specimen: BLOOD  Result Value Ref Range Status   Specimen Description   Final    BLOOD RIGHT ANTECUBITAL Performed at [facility], [address], [place], [place] [address]    Special Requests   Final    BOTTLES DRAWN AEROBIC AND ANAEROBIC Blood Culture adequate volume Performed at [facility], [address], [place], [place] [address]    Culture  Setup Time   Final    AEROBIC BOTTLE ONLY GRAM POSITIVE COCCI Gram Stain Report Called to,Read Back By and Verified With: W COUNCIL,RN@0200  10/26/20 MKELLY CRITICAL VALUE NOTED.  VALUE IS CONSISTENT WITH PREVIOUSLY REPORTED AND CALLED VALUE. Performed at [facility], [address], [place], [place] [address]    Culture STAPHYLOCOCCUS AUREUS (A)  Final   Report Status PENDING  Incomplete  Culture, blood (routine x 2)     Status: Abnormal (Preliminary  result)   Collection Time: 10/25/20  8:06 AM   Specimen: BLOOD RIGHT WRIST  Result Value Ref Range Status   Specimen Description   Final    BLOOD RIGHT WRIST Performed at [facility], [address] [place], [place] [address]    Special Requests   Final    BOTTLES DRAWN AEROBIC AND ANAEROBIC Blood Culture adequate volume Performed at [facility], [address], [place], [place] [address]    Culture  Setup Time   Final    AEROBIC BOTTLE ONLY GRAM POSITIVE COCCI Gram Stain Report Called to,Read Back By and Verified With: W COUNCIL,RN@2204  10/25/20 MKELLY CRITICAL RESULT CALLED TO, READ BACK BY AND VERIFIED WITH: PHARMD GREG ABBOTT 10/26/2020 AT 0440 A.HUGHES ANAEROBIC BOTTLE ALSO Performed at [facility], [address] [place], [place] [address]    Culture (A)  Final    STAPHYLOCOCCUS AUREUS SUSCEPTIBILITIES TO FOLLOW Performed at [facility], [address], [place], [place] [address]    Report Status PENDING  Incomplete  Blood Culture ID Panel (Reflexed)     Status: Abnormal  Collection Time: 10/25/20  8:06 AM  Result Value Ref Range Status   Enterococcus faecalis NOT DETECTED NOT DETECTED Final   Enterococcus Faecium NOT DETECTED NOT DETECTED Final   Listeria monocytogenes NOT DETECTED NOT DETECTED Final   Staphylococcus species DETECTED (A) NOT DETECTED Final    Comment: RBV PHARMD GREG ABBOTT 10/26/2020 AT 0441 A.HUGHES    Staphylococcus aureus (BCID) DETECTED (A) NOT DETECTED Final    Comment: Methicillin (oxacillin)-resistant Staphylococcus aureus (MRSA). MRSA is predictably resistant to beta-lactam antibiotics (except ceftaroline). Preferred therapy is vancomycin unless clinically contraindicated. Patient requires contact precautions if  hospitalized. RBV PHARMD GREG ABBOTT 10/26/2020 AT 0440 A.HUGHES    Staphylococcus epidermidis NOT DETECTED NOT DETECTED Final   Staphylococcus lugdunensis NOT DETECTED NOT DETECTED Final   Streptococcus  species NOT DETECTED NOT DETECTED Final   Streptococcus agalactiae NOT DETECTED NOT DETECTED Final   Streptococcus pneumoniae NOT DETECTED NOT DETECTED Final   Streptococcus pyogenes NOT DETECTED NOT DETECTED Final   A.calcoaceticus-baumannii NOT DETECTED NOT DETECTED Final   Bacteroides fragilis NOT DETECTED NOT DETECTED Final   Enterobacterales NOT DETECTED NOT DETECTED Final   Enterobacter cloacae complex NOT DETECTED NOT DETECTED Final   Escherichia coli NOT DETECTED NOT DETECTED Final   Klebsiella aerogenes NOT DETECTED NOT DETECTED Final   Klebsiella oxytoca NOT DETECTED NOT DETECTED Final   Klebsiella pneumoniae NOT DETECTED NOT DETECTED Final   Proteus species NOT DETECTED NOT DETECTED Final   Salmonella species NOT DETECTED NOT DETECTED Final   Serratia marcescens NOT DETECTED NOT DETECTED Final   Haemophilus influenzae NOT DETECTED NOT DETECTED Final   Neisseria meningitidis NOT DETECTED NOT DETECTED Final   Pseudomonas aeruginosa NOT DETECTED NOT DETECTED Final   Stenotrophomonas maltophilia NOT DETECTED NOT DETECTED Final   Candida albicans NOT DETECTED NOT DETECTED Final   Candida auris NOT DETECTED NOT DETECTED Final   Candida glabrata NOT DETECTED NOT DETECTED Final   Candida krusei NOT DETECTED NOT DETECTED Final   Candida parapsilosis NOT DETECTED NOT DETECTED Final   Candida tropicalis NOT DETECTED NOT DETECTED Final   Cryptococcus neoformans/gattii NOT DETECTED NOT DETECTED Final   Meth resistant mecA/C and MREJ DETECTED (A) NOT DETECTED Final    Comment: PHARMD GREG ABBOTT 10/26/2020 AT 0441 A.HUGHES CRITICAL RESULT CALLED TO, READ BACK BY AND VERIFIED WITH: Performed at [facility], [address], [place], [place] [address]     Pertinent Lab. CBC Latest Ref Rng & Units 10/26/2020 10/26/2020 10/25/2020  WBC 4.0 - 10.5 K/uL 6.7 - 7.2  Hemoglobin 13.0 - 17.0 g/dL 12.1(L) 12.9(L) 13.8  Hematocrit 39.0 - 52.0 % 38.7(L) 38.0(L) 46.8  Platelets 150 - 400 K/uL  280 - 490(H)   CMP Latest Ref Rng & Units 10/27/2020 10/26/2020 10/26/2020  Glucose 70 - 99 mg/dL 916(B) 846(K) -  BUN 6 - 20 mg/dL 59(D) 35(T) -  Creatinine 0.61 - 1.24 mg/dL 0.17 7.93 -  Sodium 903 - 145 mmol/L 136 135 139  Potassium 3.5 - 5.1 mmol/L 4.7 4.6 4.0  Chloride 98 - 111 mmol/L 104 103 -  CO2 22 - 32 mmol/L 29 27 -  Calcium 8.9 - 10.3 mg/dL 8.0(L) 8.0(L) -  Total Protein 6.5 - 8.1 g/dL 0.0(P) - -  Total Bilirubin 0.3 - 1.2 mg/dL 1.2 - -  Alkaline Phos 38 - 126 U/L 56 - -  AST 15 - 41 U/L 301(H) - -  ALT 0 - 44 U/L 388(H) - -     Pertinent Imaging today  Plain films and CT images have been personally visualized and interpreted; radiology reports have been reviewed. Decision making incorporated into the Impression / Recommendations.  Chest Xray 10/27/20 FINDINGS: Endotracheal tube, NG tube, right IJ line stable position. Left IJ line noted with tip over SVC. Cardiomegaly again noted. Mild bilateral interstitial prominence again noted. Small bilateral pleural effusions. No pneumothorax.  IMPRESSION: 1. Endotracheal tube, NG tube, right IJ line in stable position. Left IJ line noted with tip good position on today's exam with tip over SVC.  2. Cardiomegaly. Mild bilateral interstitial prominence again noted suggesting interstitial edema. Pneumonitis cannot be excluded. Similar findings noted on prior exam. Small bilateral pleural effusions noted.  I have spent more than 35 minutes for this patient encounter including review of prior medical records, coordination of care  with greater than 50% of time being face to face/counseling and discussing diagnostics/treatment plan with the patient/family.  Electronically signed by:   Odette Fraction, MD Infectious Disease Physician [facility] for Infectious Disease Pager: [phone]

## 2020-10-26 NOTE — Progress Notes (Addendum)
Advanced Heart Failure Rounding Note  PCP-Cardiologist: None   Subjective:   Transferred to [facility] from [facility] for shock treatment.   5/23- started on CVVHD. On norepi  5 mcg + dobutamine 2.5 mcg.   Made 1 liter of urine.   CO-OX 74%  Sedated on vent. FiO2 40%  Objective:   Weight Range: 95.9 kg Body mass index is 33.11 kg/m.   Vital Signs:   Temp:  [92.48 F (33.6 C)-101.4 F (38.6 C)] 95.7 F (35.4 C) (05/24 0715) Pulse Rate:  [31-121] 81 (05/24 0715) Resp:  [9-32] 18 (05/24 0715) BP: (78-121)/(56-94) 100/64 (05/24 0734) SpO2:  [92 %-100 %] 92 % (05/24 0734) FiO2 (%):  [40 %-50 %] 40 % (05/24 0734) Weight:  [95.9 kg] 95.9 kg (05/24 0500) Last BM Date:  (PTA)  Weight change: Filed Weights   10/24/20 1353 10/25/20 0608 10/26/20 0500  Weight: 90.4 kg 89.6 kg 95.9 kg    Intake/Output:   Intake/Output Summary (Last 24 hours) at 10/26/2020 0742 Last data filed at 10/26/2020 0700 Gross per 24 hour  Intake 2490.59 ml  Output 2050 ml  Net 440.59 ml      Physical Exam   CVP 15  General:  Sedated on vent  HEENT: ETT  Neck: Supple. JVP to jaw. Carotids 2+ bilat; no bruits. No lymphadenopathy or thyromegaly appreciated. RIJ HD catheter LIJ central line.  Cor: PMI nondisplaced. Regular rate & rhythm. No rubs, gallops or murmurs. Lungs: Clear Abdomen: Soft, nontender, nondistended. No hepatosplenomegaly. No bruits or masses. Good bowel sounds. Extremities: No cyanosis, clubbing, rash, R and LLE trace-1+ edema Neuro: Sedate on vent. Moves all extremities.    Telemetry   SR 70-80s   EKG    N/A  Labs    CBC Recent Labs    10/25/20 0537 10/26/20 0318 10/26/20 0319  WBC 7.2  --  6.7  HGB 13.8 12.9* 12.1*  HCT 46.8 38.0* 38.7*  MCV 92.9  --  89.2  PLT 490*  --  017   Basic Metabolic Panel Recent Labs    10/25/20 0537 10/25/20 0654 10/25/20 1753 10/26/20 0241 10/26/20 0318  NA 137   < > 136 137 139  K 6.8*   < > 6.0* 4.1 4.0  CL 101   < > 103 102   --   CO2 24   < > 23 28  --   GLUCOSE 144*   < > 152* 168*  --   BUN 26*   < > 31* 24*  --   CREATININE 1.82*   < > 2.09* 1.40*  --   CALCIUM 8.4*   < > 8.0* 8.0*  --   MG 2.1  --   --  2.0  --   PHOS  --   --   --  4.3  --    < > = values in this interval not displayed.   Liver Function Tests Recent Labs    10/25/20 0537 10/26/20 0241  AST 81* 339*  ALT 97* 335*  ALKPHOS 81 54  BILITOT 2.2* 1.7*  PROT 6.8 5.3*  ALBUMIN 3.5 2.5*   No results for input(s): LIPASE, AMYLASE in the last 72 hours. Cardiac Enzymes No results for input(s): CKTOTAL, CKMB, CKMBINDEX, TROPONINI in the last 72 hours.  BNP: BNP (last 3 results) Recent Labs    10/21/20 0033 10/23/20 0504  BNP 2,252.0* 1,685.0*    ProBNP (last 3 results) No results for input(s): PROBNP in the last 8760  hours.   D-Dimer No results for input(s): DDIMER in the last 72 hours. Hemoglobin A1C No results for input(s): HGBA1C in the last 72 hours. Fasting Lipid Panel No results for input(s): CHOL, HDL, LDLCALC, TRIG, CHOLHDL, LDLDIRECT in the last 72 hours. Thyroid Function Tests No results for input(s): TSH, T4TOTAL, T3FREE, THYROIDAB in the last 72 hours.  Invalid input(s): FREET3  Other results:   Imaging    DG CHEST PORT 1 VIEW  Result Date: 10/26/2020 CLINICAL DATA:  Intubation.  Respiratory failure. EXAM: PORTABLE CHEST 1 VIEW COMPARISON:  10/25/2020. FINDINGS: Left IJ line noted with its tip over the right brachiocephalic vein. Endotracheal tube, NG tube, right IJ line in stable position. Cardiomegaly again noted. Low lung volumes with bibasilar atelectasis. Diffuse mild bilateral pulmonary infiltrates/edema. Small left pleural effusion. No pneumothorax. IMPRESSION: 1. Left IJ line noted with tip over the right back swelling vein. Endotracheal tube, NG tube, right IJ line stable position. 2.  Cardiomegaly again noted. 3. Low lung volumes with bibasilar atelectasis. Diffuse mild bilateral pulmonary  infiltrates/edema. Small left pleural effusion. Electronically Signed   By: Marcello Moores  Register   On: 10/26/2020 06:12   DG CHEST PORT 1 VIEW  Result Date: 10/25/2020 CLINICAL DATA:  Left IJ placement EXAM: PORTABLE CHEST 1 VIEW COMPARISON:  10/25/2020, 9:15 a.m. FINDINGS: Interval placement of left neck vascular catheter, tip projecting in the vicinity of the brachiocephalic confluence, directed slightly superiorly, possibly with azygos placement. Unchanged right neck multi lumen vascular catheter, tip projecting over the lower SVC. Otherwise unchanged examination with endotracheal tube, esophagogastric tube, cardiomegaly, and left pleural effusion and or atelectasis. IMPRESSION: 1. Interval placement of left neck vascular catheter, tip projecting in the vicinity of the brachiocephalic confluence, directed slightly superiorly, possibly with azygos placement. 2. Otherwise unchanged examination with support apparatus as detailed above. Electronically Signed   By: Eddie Candle M.D.   On: 10/25/2020 19:57   DG Chest Port 1V same Day  Result Date: 10/25/2020 CLINICAL DATA:  Status post central line placement for dialysis. EXAM: PORTABLE CHEST 1 VIEW COMPARISON:  Single-view of the chest earlier today. FINDINGS: New double lumen right IJ approach dialysis catheter is in place. Tip of the catheter projects in the mid superior vena cava. ETT and NG tube are unchanged. No pneumothorax. Marked enlargement of the cardiopericardial silhouette again seen. Left basilar opacity is unchanged. IMPRESSION: New right IJ approach dialysis catheter tip projects in the mid superior vena cava. Negative for pneumothorax. No change in left basilar airspace opacity likely due to atelectasis. Marked enlargement of the cardiopericardial silhouette could be due to cardiomegaly and/or pericardial effusion. Electronically Signed   By: Inge Rise M.D.   On: 10/25/2020 09:48      Medications:     Scheduled Medications: .  chlorhexidine gluconate (MEDLINE KIT)  15 mL Mouth Rinse BID  . Chlorhexidine Gluconate Cloth  6 each Topical Q0600  . docusate  50 mg Per Tube Daily  . enoxaparin (LOVENOX) injection  40 mg Subcutaneous Q24H  . feeding supplement  237 mL Per Tube BID BM  . feeding supplement (PROSource TF)  45 mL Per Tube BID  . feeding supplement (VITAL HIGH PROTEIN)  1,000 mL Per Tube Q24H  . folic acid  1 mg Per Tube Daily  . hydrocortisone sod succinate (SOLU-CORTEF) inj  100 mg Intravenous Q8H  . mouth rinse  15 mL Mouth Rinse 10 times per day  . multivitamin  15 mL Per Tube Daily  . mupirocin ointment  1 application Nasal BID  . pantoprazole (PROTONIX) IV  40 mg Intravenous Q24H  . polyethylene glycol  17 g Oral Daily  . thiamine  100 mg Per Tube Daily   Or  . thiamine  100 mg Intravenous Daily     Infusions: .  prismasol BGK 4/2.5 400 mL/hr at 10/26/20 0646  .  prismasol BGK 4/2.5 400 mL/hr at 10/26/20 0645  . sodium chloride    . dexmedetomidine (PRECEDEX) IV infusion Stopped (10/25/20 0415)  . DOBUTamine    . fentaNYL infusion INTRAVENOUS 95 mcg/hr (10/26/20 0700)  . midazolam 2 mg/hr (10/26/20 0700)  . norepinephrine (LEVOPHED) Adult infusion 4 mcg/min (10/26/20 0700)  . piperacillin-tazobactam Stopped (10/26/20 0546)  . prismasol BGK 4/2.5 1,500 mL/hr at 10/26/20 0725  . vancomycin 200 mL/hr at 10/26/20 0700  . vasopressin Stopped (10/25/20 1442)     PRN Medications:  diphenhydrAMINE, haloperidol lactate, heparin, LORazepam **OR** LORazepam, ondansetron (ZOFRAN) IV, sodium chloride     Assessment/Plan  1. Shock -Suspect Cardiogenic  -Procalcitonin 0.46.  -Lactic Acid 4.5 >3.8> 1.6   -Blood Cx pending - Remains hypotensive.  -On Norepi 4 + dobutamine 2.5 . CO-OX 74%   2. Acute Systolic HF -? ETOH induced. 5/23 204>141. If improves will need cath.  -Echo EF 15% with mild RV failure -Starting on CVVHD.  - See above.  - On norepi + dobutamine  3. Acute Hypoxic  Respiratory Failure -Intubated 5/22 / CCM managing.   4. AKI -Creatinine on admit 0.9 - Starting CVVHD 5/23   5. Hyperkalemia  -6.9 peaked.  -Given insulin/dextrose/lokelma  - On CVVHD   6. Polysubstance Abuse -UDS + cocaine, benzo, and THC  7. ETOH Abuse -Started withdrawing 5/21.Given ativan   Length of Stay: [place], NP  10/26/2020, 7:42 AM  Advanced Heart Failure Team Pager [phone] (M-F; 7a - 5p)  Please contact [facility] for night-coverage after hours (5p -7a ) and weekends on [email or web address]  Patient seen with NP, agree with the above note.   Currently on CVVH pulling net UF 50 cc/hr.  Made 1000 cc urine.  Co-ox 74%, lactate 1.6.  CVP 15.  He remains on NE 5, dobutamine 2.5.  Currently sedated, per nurse he will awaken and follow commands but gets agitated.   Afebrile, PCT 1.5. MRSA on blood culture. Now on Zosyn + vancomycin.   General: Sedated on vent Neck: JVP 12+ cm, no thyromegaly or thyroid nodule.  Lungs: Coarse BS CV: Nondisplaced PMI.  Heart regular S1/S2, no S3/S4, no murmur.  No peripheral edema.    Abdomen: Soft, nontender, no hepatosplenomegaly, no distention.  Skin: Intact without lesions or rashes.  Neurologic: Will awaken, follow commands.  Extremities: No clubbing or cyanosis.  HEENT: Normal.   1. Shock: Suspect primarily cardiogenic but may be septic component with MRSA on blood cultures.  - Currently on NE 5 + dobutamine 2.5 with stable MAP. Wean NE as able.  2. Acute systolic CHF: Echo this admission with EF 15%, moderate LV dilation, mildly decreased RV function, mild MR.  Cause uncertain, could be due to ETOH abuse but cannot rule out CAD. Presentation consistent with CHF, not ACS.  HS-TnI in setting of hypotension/shock was 204 => 141. CVP 15 today on CVVH, co-ox 74% on NE 5 + dobutamine 2.5.  - Continue CVVH aim for net UF 50-100 cc/hr  - Wean down NE as needed. - Continue dobutamine 2.5.  - Ideally would eventually get  right/left cath, will depend on  trajectory of renal function.  3. AKI: Creatinine up to 2.09 with persistent hyperkalemia and minimal UOP.  Patient was transferred from [facility] to start CVVH, renal following closely.  Now on CVVH, HCO3 gtt stopped.  Made 1000 cc urine over last day.  - Continue gentle UF via CVVH, would like to see net negative 50-100 cc/hr today.  4. ETOH abuse: With withdrawal.  5. Cocaine abuse: Apparently this is not chronic.  6. Acute hypoxemic respiratory failure: Initially in setting of ETOH withdrawal and altered mental status.  - Vent per CCM.  7. ID: MRSA in blood, ?PNA source.  PCT 1.5.  - Continue vancomycin/Zosyn.  - TEE tomorrow to look for endocarditis.   CRITICAL CARE Performed by: Loralie Champagne  Total critical care time: 35 minutes  Critical care time was exclusive of separately billable procedures and treating other patients.  Critical care was necessary to treat or prevent imminent or life-threatening deterioration.  Critical care was time spent personally by me on the following activities: development of treatment plan with patient and/or surrogate as well as nursing, discussions with consultants, evaluation of patient's response to treatment, examination of patient, obtaining history from patient or surrogate, ordering and performing treatments and interventions, ordering and review of laboratory studies, ordering and review of radiographic studies, pulse oximetry and re-evaluation of patient's condition.  Loralie Champagne 10/26/2020 8:56 AM

## 2020-10-26 NOTE — Progress Notes (Signed)
PHARMACY - PHYSICIAN COMMUNICATION CRITICAL VALUE ALERT - BLOOD CULTURE IDENTIFICATION (BCID)  Kyle Peck is an 60 y.o. male who presented to [facility] on 10/20/2020 with a chief complaint of  CHF and VDRF, ARF now on CRRT.  Assessment:   2/2 blood cultures growing MRSA  Name of physician (or Provider) Contacted:  Dr.Sommer  Current antibiotics:  Vancomycin and Zosyn   Changes to prescribed antibiotics recommended:  Consider d/c Zosyn and narrowing to  Vancomycin when appropriate  Results for orders placed or performed during the hospital encounter of 10/20/20  Blood Culture ID Panel (Reflexed) (Collected: 10/25/2020  8:06 AM)  Result Value Ref Range   Enterococcus faecalis NOT DETECTED NOT DETECTED   Enterococcus Faecium NOT DETECTED NOT DETECTED   Listeria monocytogenes NOT DETECTED NOT DETECTED   Staphylococcus species DETECTED (A) NOT DETECTED   Staphylococcus aureus (BCID) DETECTED (A) NOT DETECTED   Staphylococcus epidermidis NOT DETECTED NOT DETECTED   Staphylococcus lugdunensis NOT DETECTED NOT DETECTED   Streptococcus species NOT DETECTED NOT DETECTED   Streptococcus agalactiae NOT DETECTED NOT DETECTED   Streptococcus pneumoniae NOT DETECTED NOT DETECTED   Streptococcus pyogenes NOT DETECTED NOT DETECTED   A.calcoaceticus-baumannii NOT DETECTED NOT DETECTED   Bacteroides fragilis NOT DETECTED NOT DETECTED   Enterobacterales NOT DETECTED NOT DETECTED   Enterobacter cloacae complex NOT DETECTED NOT DETECTED   Escherichia coli NOT DETECTED NOT DETECTED   Klebsiella aerogenes NOT DETECTED NOT DETECTED   Klebsiella oxytoca NOT DETECTED NOT DETECTED   Klebsiella pneumoniae NOT DETECTED NOT DETECTED   Proteus species NOT DETECTED NOT DETECTED   Salmonella species NOT DETECTED NOT DETECTED   Serratia marcescens NOT DETECTED NOT DETECTED   Haemophilus influenzae NOT DETECTED NOT DETECTED   Neisseria meningitidis NOT DETECTED NOT DETECTED   Pseudomonas aeruginosa NOT  DETECTED NOT DETECTED   Stenotrophomonas maltophilia NOT DETECTED NOT DETECTED   Candida albicans NOT DETECTED NOT DETECTED   Candida auris NOT DETECTED NOT DETECTED   Candida glabrata NOT DETECTED NOT DETECTED   Candida krusei NOT DETECTED NOT DETECTED   Candida parapsilosis NOT DETECTED NOT DETECTED   Candida tropicalis NOT DETECTED NOT DETECTED   Cryptococcus neoformans/gattii NOT DETECTED NOT DETECTED   Meth resistant mecA/C and MREJ DETECTED (A) NOT DETECTED    Eddie Candle 10/26/2020  5:08 AM

## 2020-10-26 NOTE — Progress Notes (Signed)
Initial Nutrition Assessment  DOCUMENTATION CODES:   Not applicable  INTERVENTION:   Transition liquid MVI to B complex with vitamin C to account for losses with CRRT  No BM x 5 days- strengthen regimen, may require suppository   Tube feeding: -Vital 1.5 @ 55 ml/hr via OG (1320 ml) -ProSource TF 45 ml QID  Provides: 2140 kcals, 133 grams protein, 1008 ml free water.   NUTRITION DIAGNOSIS:   Increased nutrient needs related to acute illness as evidenced by estimated needs.  GOAL:   Patient will meet greater than or equal to 90% of their needs  MONITOR:   Vent status,Skin,TF tolerance,Weight trends,Labs,I & O's  REASON FOR ASSESSMENT:   Ventilator,Consult Enteral/tube feeding initiation and management  ASSESSMENT:   Patient with PMH of polysubstance use. Presents this admission with new cardiomyopathy in setting of ETOH/cocaine use.   5/18- admitted to [facility] 5/23- transfer to [facility], start CRRT, intubated   Requiring levophed. Remains on CRRT, pulling on 50 ml/hr. Hyperkalemia improved. Started on Vital High Protein @ 40 ml/hr. Transition formula to better meet needs.   Weight history limited over the last year. Utilize 90.3 kg as EDW for now. Noted mild fat/muscle depletions. Suspect fluid could be masking further losses. Will need to repeat NFPE once diuresed, to determine if patient is malnourished.   Patient is currently intubated on ventilator support MV: 8.7 L/min Temp (24hrs), Avg:95.9 F (35.5 C), Min:92.48 F (33.6 C), Max:98.5 F (36.9 C)  UOP: 1040 ml x 24 hrs CRRT: 880 ml x 24 hrs   Drips: levophed Medications: colace, folic acid, solucortef, miralax, thiamine  Labs: Cr 1.40- trending down CBG 151-176  NUTRITION - FOCUSED PHYSICAL EXAM:  Flowsheet Row Most Recent Value  Orbital Region No depletion  Upper Arm Region Mild depletion  Thoracic and Lumbar Region Unable to assess  Buccal Region Unable to assess  Temple Region Moderate depletion   Clavicle Bone Region Mild depletion  Clavicle and Acromion Bone Region Mild depletion  Scapular Bone Region Unable to assess  Dorsal Hand No depletion  Patellar Region Mild depletion  Anterior Thigh Region No depletion  Posterior Calf Region Unable to assess  Edema (RD Assessment) Unable to assess  Hair Reviewed  Eyes Unable to assess  Mouth Unable to assess  Skin Reviewed  Nails Reviewed     Diet Order:   Diet Order            Diet NPO time specified  Diet effective midnight           Diet NPO time specified  Diet effective now                 EDUCATION NEEDS:   Not appropriate for education at this time  Skin:  Skin Assessment: Skin Integrity Issues: Skin Integrity Issues:: DTI,Stage II DTI: buttocks Stage II: buttocks  Last BM:  PTA  Height:   Ht Readings from Last 1 Encounters:  10/25/20 5\' 7"  (1.702 m)    Weight:   Wt Readings from Last 1 Encounters:  10/26/20 95.9 kg    BMI:  Body mass index is 33.11 kg/m.  Estimated Nutritional Needs:   Kcal:  2000-2300 kcal  Protein:  130-145 grams  Fluid:  >/= 2 L/day  10/28/20 RD, LDN Clinical Nutrition Pager listed in AMION

## 2020-10-26 NOTE — Consult Note (Signed)
WOC Nurse Consult Note: Patient receiving care in [facility] 2H04 Admitted yesterday from [facility] Intubated and sedated with generalized anasarca on CRRT. Reason for Consult: Sacral DTI Wound type: DTPI sacrum that is high risk to evolve into an unstageable PI Pressure Injury POA: No Wound bed: Purple/maroon with a pin hole sized area on the left buttock Drainage (amount, consistency, odor) None Periwound: Intact Dressing procedure/placement/frequency: Continue sacral foam dressing to the buttocks with the tip pointed upwards to prevent soiling from incontinence.  WOC will follow weekly.  Monitor the wound area(s) for worsening of condition such as: Signs/symptoms of infection, increase in size, development of or worsening of odor, development of pain, or increased pain at the affected locations.   Notify the medical team if any of these develop.  Thank you for the consult. Please re-consult the WOC team if needed.  Renaldo Reel Katrinka Blazing, MSN, RN, CMSRN, Angus Seller, [facility] Wound Treatment Associate Pager [phone]

## 2020-10-26 NOTE — Progress Notes (Signed)
[facility] Progress Note  Name: Kyle Peck MRN: [id number] DOB: 03-24-1961  Subjective:  He was transferred from [facility] to [facility] for CRRT.  Had 800 mL UF reported thus far over 5/23 reporting period.  Had 1 liter UOP Over 5/23.  he has been ordered bicarb gtt. CRRT has been at UF net neg 50 ml/hr.  He has been on levo at 2 mcg/min and dobutamine.  On fentanyl and versed for sedation.  Per nursing last CVP 8-9 down from 14.   Review of systems:  Unable to obtain 2/2 intubated and sedated ----------  Background on consult Kyle Peck is a 60 y.o. male with past medical history significant for polysubstance abuse.  History is obtained from the chart.  He presented to medical attention due to leg and abdominal swelling, DOE on 5/19.  Found to have an EF of 15 %- given IV lasix and heart cath was planned.  Over the last 24 hours, pt developed acute ETOH withdrawal, had to be sedated-  Then developed resp failure req intubation.  Has become more hypotensive overnight- req pressors which have been maxed out-  Baseline crt under 1, was 0.9 yesterday -  Now anuric and K was 6.8 this AM (getting LR boluses and drip)  With crt of 2.  CCM is at bedside putting in central line, we changed it to a trialysis cath as unfortunately I feel he needs to transfer to [facility] and get CRRT.  He is not that acidotic yet but has a lactate of 4.5  Intake/Output Summary (Last 24 hours) at 10/26/2020 0553 Last data filed at 10/26/2020 0500 Gross per 24 hour  Intake 2116.85 ml  Output 1913 ml  Net 203.85 ml    Vitals:  Vitals:   10/26/20 0200 10/26/20 0300 10/26/20 0324 10/26/20 0400  BP: 100/62 (!) 94/57  (!) 93/56  Pulse: 63 72  81  Resp: 18 18  18   Temp: (!) 94.1 F (34.5 C) (!) 95.36 F (35.2 C)  (!) 96.44 F (35.8 C)  TempSrc:      SpO2: 96% 94% 96% 95%  Weight:      Height:         Physical Exam:  General adult male in bed critically ill  HEENT normocephalic atraumatic  Lungs clear  to auscultation bilaterally; FiO2 50, PEEP 5 Heart S1S2 no rub Abdomen soft nontender distended with obese habitus Extremities no lower extremity edema; trace upper extremity edema Psych no anxiety or agitation but sedated Neuro - versed and fentanyl running Access RIJ nontunneled dialysis catheter   Medications reviewed   Labs:  BMP Latest Ref Rng & Units 10/26/2020 10/26/2020 10/25/2020  Glucose 70 - 99 mg/dL - 10/27/2020) 176(H)  BUN 6 - 20 mg/dL - 607(P) 71(G)  Creatinine 0.61 - 1.24 mg/dL - 62(I) 9.48(N)  Sodium 135 - 145 mmol/L 139 137 136  Potassium 3.5 - 5.1 mmol/L 4.0 4.1 6.0(H)  Chloride 98 - 111 mmol/L - 102 103  CO2 22 - 32 mmol/L - 28 23  Calcium 8.9 - 10.3 mg/dL - 8.0(L) 8.0(L)     Assessment/Plan:   Assessment/Plan: 60 year old WM with polysubstance abuse including cocaine and ETOH- presented with new found EF of 15%-  Decompensated and developed AKI and hyperkalemia  1.AKI - ATN secondary to hemodynamic instability. Baseline Cr 0.9.  - Continue CRRT  - Stop bicarb gtt as on CRRT - CRRT UF rate at net neg 50 an hour - continue same  -  Change to all 4K baths; pre-filter fluids to 400 ml/hr  2. Hyperkalemia-  Due to AKI and LR.  Treated with CRRT    3. Acute systolic heart failure with cardiogenic shock.  heart failure team has seen   4. Polysubstance abuse - including cocaine and EtOH   5. EtOH Withdrawal - per primary team.  On fentanyl/versed  6. Acute hypoxemic resp failure - setting of etoh withdrawal.  Vent per critical care   Estanislado Emms, MD 10/26/2020  6:15 AM

## 2020-10-26 NOTE — Progress Notes (Addendum)
Pharmacy Antibiotic Note  Kyle Peck is a 60 y.o. male admitted on 10/20/2020 with sepsis.  Patient was found to have 2/2 MRSA bacteremia. Of note, TTE and TEE were negative for endocarditis. This is day 5 of antimicrobial therapy. Pharmacy has been consulted for Vancomycin dosing. CRRT started 5/23 ~ 9 pm and CRRT was stopped 5/26 ~1445 per RN note. Given persistent urine output (2.5L) already today, will opt for early vanc level check (~12h) with suspected adequate renal function for multi-day dosing.  Dosing thus far:  Patient received one time loading dose of 2000mg  x1 on 5/23 PM and vanc random level at 0315 was 10. Patient then received vancomycin 1000mg  IV q24h with last dose given 5/27 @0600 .   Plan: Vanc random level today at 1800  Re-dose as needed Follow vancomycin levels as needed   F/u renal function, clinical course, CBC, cultures   Height: 5\' 7"  (170.2 cm) Weight: 95.9 kg (211 lb 6.7 oz) IBW/kg (Calculated) : 66.1  Temp (24hrs), Avg:95.5 F (35.3 C), Min:92.48 F (33.6 C), Max:101.4 F (38.6 C)  Recent Labs  Lab 10/21/20 0033 10/21/20 0404 10/22/20 0434 10/23/20 0503 10/25/20 0537 10/25/20 0654 10/25/20 0947 10/25/20 1753 10/26/20 0241 10/26/20 0319  WBC 5.6 6.4 5.6  --  7.2  --   --   --   --  6.7  CREATININE 1.09 1.05 0.92   < > 1.82* 2.07* 1.83* 2.09* 1.40*  --   LATICACIDVEN  --   --   --   --   --  4.5* 3.8*  --  1.6  --   VANCORANDOM  --   --   --   --   --   --   --   --   --  10   < > = values in this interval not displayed.    Estimated Creatinine Clearance: 61.9 mL/min (A) (by C-G formula based on SCr of 1.4 mg/dL (H)).    No Known Allergies  Antimicrobials this admission: Vanco 5/23 >>  Zosyn 5/23 >>5/24   Microbiology results: 5/25 BCx: NG 1 day 5/23 BCx: 2/4 MRSA  5/23 MRSA PCR: positive  6/23, PharmD PGY1 Pharmacy Resident 10/29/2020 9:56 AM

## 2020-10-26 NOTE — Progress Notes (Signed)
eLink Physician-Brief Progress Note Patient Name: Kyle Peck DOB: [birth year] MRN: [id number]   Date of Service  10/26/2020  HPI/Events of Note  Patient with history of polysubstance abuse. Not adequately sedated with RASS goal at 0 to -1.  eICU Interventions  Will change RASS titration goal to 0 to -2.     Intervention Category Major Interventions: Delirium, psychosis, severe agitation - evaluation and management  [name] 10/26/2020, 10:34 PM

## 2020-10-26 NOTE — Plan of Care (Signed)
  Problem: Cardiac: Goal: Ability to achieve and maintain adequate cardiopulmonary perfusion will improve Outcome: Progressing   Problem: Clinical Measurements: Goal: Respiratory complications will improve Outcome: Progressing   Problem: Nutrition: Goal: Adequate nutrition will be maintained Outcome: Progressing   Problem: Elimination: Goal: Will not experience complications related to bowel motility Outcome: Progressing   Problem: Pain Managment: Goal: General experience of comfort will improve Outcome: Progressing   Problem: Safety: Goal: Ability to remain free from injury will improve Outcome: Progressing   Problem: Safety: Goal: Non-violent Restraint(s) Outcome: Completed/Met

## 2020-10-26 NOTE — TOC Initial Note (Signed)
Transition of Care (TOC) - Initial/Assessment Note  Heart Failure   Patient Details  Name: Kyle Peck MRN: [id number] Date of Birth: February 15, 1961  Transition of Care [facility]) CM/SW Contact:    Murna Backer, LCSWA Phone Number: 10/26/2020, 3:16 PM  Clinical Narrative:               CSW attempted to visit the patient at bedside to introduce self as the heart failure social worker and to complete a very brief SDOH screening with the patient to address social needs as needed however the patient was unresponsive and unable to engage in conversation at this time.   CSW will continue to follow for discharge needs and will check back with the patient at another time.      Expected Discharge Plan: Home/Self Care Barriers to Discharge: Continued Medical Work up   Patient Goals and CMS Choice Patient states their goals for this hospitalization and ongoing recovery are:: return home   Choice offered to / list presented to : Spouse  Expected Discharge Plan and Services Expected Discharge Plan: Home/Self Care In-house Referral: Clinical Social Work     Living arrangements for the past 2 months: Single Family Home                                      Prior Living Arrangements/Services Living arrangements for the past 2 months: Single Family Home Lives with:: Spouse Patient language and need for interpreter reviewed:: Yes Do you feel safe going back to the place where you live?: Yes      Need for Family Participation in Patient Care: Yes (Comment) Care giver support system in place?: Yes (comment)   Criminal Activity/Legal Involvement Pertinent to Current Situation/Hospitalization: No - Comment as needed  Activities of Daily Living Home Assistive Devices/Equipment: None ADL Screening (condition at time of admission) Patient's cognitive ability adequate to safely complete daily activities?: Yes Is the patient deaf or have difficulty hearing?: No Does the patient have  difficulty seeing, even when wearing glasses/contacts?: No Does the patient have difficulty concentrating, remembering, or making decisions?: No Patient able to express need for assistance with ADLs?: Yes Does the patient have difficulty dressing or bathing?: No Independently performs ADLs?: Yes (appropriate for developmental age) Does the patient have difficulty walking or climbing stairs?: No Weakness of Legs: None Weakness of Arms/Hands: None  Permission Sought/Granted Permission sought to share information with : Family Supports    Share Information with NAME: Kyle Peck     Permission granted to share info w Relationship: spouse     Emotional Assessment Appearance:: Appears stated age Attitude/Demeanor/Rapport: Unable to Assess (on vent) Affect (typically observed): Unable to Assess (on vent) Orientation: : Oriented to Self,Oriented to Place,Oriented to  Time,Oriented to Situation Alcohol / Substance Use: Not Applicable Psych Involvement: No (comment)  Admission diagnosis:  Anasarca [R60.1] CHF (congestive heart failure) (HCC) [I50.9] Acute pulmonary edema (HCC) [J81.0] Transaminitis [R74.01] New onset of congestive heart failure (HCC) [I50.9] Patient Active Problem List   Diagnosis Date Noted  . Acute respiratory failure (HCC) 10/26/2020  . Cardiogenic shock (HCC) 10/26/2020  . Hyperkalemia 10/26/2020  . Acute renal failure (ARF) (HCC) 10/26/2020  . Acute encephalopathy 10/26/2020  . Polysubstance abuse (HCC) 10/26/2020  . ETOH abuse 10/26/2020  . Pressure injury of skin 10/25/2020  . CHF (congestive heart failure) (HCC) 10/21/2020  . Elevated brain natriuretic peptide (BNP) level 10/21/2020  .  Transaminitis 10/21/2020  . Thrombocytosis 10/21/2020  . Hypoalbuminemia due to protein-calorie malnutrition (HCC) 10/21/2020  . Tobacco use 10/21/2020   PCP:  Pcp, No Pharmacy:   [facility] - [place], [place] - [address] [place]  [address] Phone: [phone] Fax: [phone]     Social Determinants of Health (SDOH) Interventions    Readmission Risk Interventions No flowsheet data found.  Maleiah Dula, MSW, LCSWA [phone] Heart Failure Social Worker

## 2020-10-26 NOTE — Progress Notes (Addendum)
NAMEAarav Peck, MRN:  [id number], DOB:  1961-01-13, LOS: 5 ADMISSION DATE:  10/20/2020, CONSULTATION DATE:  10/26/2020  REFERRING MD:  Gwendolyn Lima, CHIEF COMPLAINT: Hypotension, post intubation  History of Present Illness:  60 year old EtOH and cocaine user admitted 5/18 with leg and abdominal swelling.  Echo showed EF of 15% with global hypokinesis, cardiology consultation advised cardiac cath after adequate medical management.  He was treated for alcohol withdrawal with Ativan and on 5/22 transferred to ICU and placed on Precedex but eventually required intubation .  He subsequently became hypotensive and required increasing Levophed and vasopressin through the night.  On 5/23, he was noted to have AKI & hyperkalemia, PCCM consulted  Pertinent  Medical History  ETOH use Marijuana and cocaine use UDS + cocaine   Significant Hospital Events: Including procedures, antibiotic start and stop dates in addition to other pertinent events    5/18 Admitted to [facility]  5/23 Intubated and HD cath placed for initiation of CRRT, transferred to [facility] for CHF evaluation and continued care   Interim History / Subjective:  No acute events overnight Tolerating CRRT   Objective   Blood pressure 100/65, pulse 81, temperature (!) 95.7 F (35.4 C), resp. rate 18, height 5\' 7"  (1.702 m), weight 95.9 kg, SpO2 95 %. CVP:  [8 mmHg-15 mmHg] 15 mmHg  Vent Mode: PRVC FiO2 (%):  [50 %] 50 % Set Rate:  [18 bmp] 18 bmp Vt Set:  [520 mL] 520 mL PEEP:  [5 cmH20] 5 cmH20 Plateau Pressure:  [9 cmH20-22 cmH20] 22 cmH20   Intake/Output Summary (Last 24 hours) at 10/26/2020 0731 Last data filed at 10/26/2020 0700 Gross per 24 hour  Intake 2490.59 ml  Output 2050 ml  Net 440.59 ml   Filed Weights   10/24/20 1353 10/25/20 0608 10/26/20 0500  Weight: 90.4 kg 89.6 kg 95.9 kg    Examination: General: Acute on chronic ill-appearing middle-aged male lying in bed on mechanical ventilation in no acute distress   HEENT: ETT, MM pink/moist, PERRL,  Neuro: Sedated on ventilator but will follow commands when sedation is lightened CV: s1s2 regular rate and rhythm, no murmur, rubs, or gallops,  PULM: Clear to auscultation bilaterally, no increased work of breathing, no added breath sounds GI: soft, bowel sounds hypoactive in all 4 quadrants, non-tender, non-distended, tolerating TF Extremities: warm/dry, mild nonpitting lower extremity edema, bilateral feet cool to touch Skin: no rashes or lesions  Labs/imaging that I have personally reviewed    5/19 ECHO with EF 15% with global hypokinesis   5/24 pertinent labs: Creatinine 1.4., Elevated LFTs, procal 1.52, WBC 6.7  Resolved Hospital Problem list     Assessment & Plan:  60yo male with finding of new cardiomyopathy in the setting of EtOH/cocaine use, intubated for severe EtOH withdrawal/agitated delirium 5/23 and now hypotension and AKI with hyperkalemia  Shock  Cardiogenic shock -ECHO with EF of 15%  -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 -Will need diagnostic cath once stabilized Septic shock  -Blood cultures positive for MRSA 5/24 P: Heart failure and cardiology consulted, appreciate assistance  Strict intake and output  Continue dobutamine and norepi Coox 74 Strict intake and output Daily weight Optimize electrolytes Volume removal per CRRT Continue IV vanc but stop Zosyn Repeat TTE today and TEE scheduled for 5/25  Acute kidney injury Hyperkalemia -Cardiorenal versus ATN related to hypotension in the last 12 hours P: Nephrology following, appreciate assistance CRRT started 5/23 Monitor urine output Ensure renal perfusion Optimize electrolytes Trend potassium  Acute Hypoxic Respiratory Failure  -In the setting acute CHF and severe ETOH withdrawal  P: Continue ventilator support with lung protective strategies  Wean PEEP and FiO2 for sats greater than 90%. Head of bed elevated 30 degrees. Plateau pressures less than 30  cm H20.  Follow intermittent chest x-ray and ABG.   SAT/SBT as tolerated, mentation preclude extubation  Ensure adequate pulmonary hygiene  Follow cultures  VAP bundle in place  PAD protocol  Acute encephalopathy/agitated delirium -In the setting of ETOH withdrawal and acute critical illness  P: CIWA and PAD protocol  Maintain neuro protective measures; goal for eurothermia, euglycemia, eunatermia, normoxia, and PCO2 goal of 35-40 Nutrition and bowel regiment  Seizure precautions  Aspirations precautions  RASS goal 0 to -1  Polysubstance abuse -UDS positive for cocaine, benzodiazepine, and THC EtOH abuse P: Cessation education when appropriate  Elevated LFTs -Felt secondary to cardiogenic shock with known history of EtOH abuse P: Trend LFTs If worsen can consider ABD ultrasound Avoid hepatotoxins  Obtain hepatitis panel  At risk malnutrition  P: Continue tube feeds  Best practice   Diet:  Tube Feed  Pain/Anxiety/Delirium protocol (if indicated): Yes (RASS goal -1) VAP protocol (if indicated): Yes DVT prophylaxis: Subcutaneous Heparin GI prophylaxis: PPI Glucose control:  SSI Yes Central venous access:  Yes, and it is still needed Arterial line:  N/A Foley:  Yes, and it is still needed Mobility:  bed rest  PT consulted: N/A Last date of multidisciplinary goals of care discussion Daily family update  Code Status:  full code Disposition: ICU   Critical care time:   Performed by: Delfin Gant  Total critical care time: 38 minutes  Critical care time was exclusive of separately billable procedures and treating other patients.  Critical care was necessary to treat or prevent imminent or life-threatening deterioration.  Critical care was time spent personally by me on the following activities: development of treatment plan with patient and/or surrogate as well as nursing, discussions with consultants, evaluation of patient's response to treatment, examination  of patient, obtaining history from patient or surrogate, ordering and performing treatments and interventions, ordering and review of laboratory studies, ordering and review of radiographic studies, pulse oximetry and re-evaluation of patient's condition.  Delfin Gant, NP-C [facility] Personal contact information can be found on Amion  10/26/2020, 8:18 AM   Pulmonary critical care attending:  This is a 60 year old gentleman, history of alcohol use, cocaine user, admitted for leg and abdominal swelling found to have an ejection fraction of 15% with global hypokinesis.  Patient developed respiratory distress concern for alcohol withdrawal was intubated given Ativan Precedex.  He subsequently became hypotensive placed on norepinephrine and vasopressin.  Found to have renal failure with hyperkalemia.  Blood cultures positive for MRSA bacteremia, urine drug screen positive for cocaine and marijuana.  BP 101/72   Pulse 99   Temp (!) 97.16 F (36.2 C)   Resp 18   Ht 5\' 7"  (1.702 m)   Wt 95.9 kg   SpO2 94%   BMI 33.11 kg/m   General: Elderly male, appears older than stated age intubated on mechanical life support, bear hugger, CVVHD HEENT: Endotracheal tube in place Heart: Regular rate rhythm, S1-S2 Lungs: Bilateral mechanically ventilated breath sounds Abdomen: Obese, soft nondistended Extremities: No significant edema  Blood culture positive for MRSA Echocardiogram pending, limited to look for vegetation Likely need TEE tomorrow  Sodium 137, creatinine 1.4, AST 339/335 Procalcitonin 1.5  Assessment Cardiogenic shock  Septic shock MRSA bacteremia Acute renal failure Hyperkalemia Acute hypoxemic respiratory failure secondary to above Polysubstance abuse Cocaine abuse Alcohol abuse Possible alcohol withdrawal Acute metabolic encephalopathy secondary above Elevated LFTs  Plan: Titrate pressors to maintain mean arterial pressure greater than 65 VAP  prophylaxis, adult mechanical ventilator protocol PAD protocol sedation Continue CVVHD per nephrology Observe kidney function and liver function tests, supportive care Continue antimicrobials for MRSA bacteremia Surface echo today If surface echo negative for vegetation May need TEE Discussed with cardiology Remains on dobutamine and norepinephrine Continue vancomycin  This patient is critically ill with multiple organ system failure; which, requires frequent high complexity decision making, assessment, support, evaluation, and titration of therapies. This was completed through the application of advanced monitoring technologies and extensive interpretation of multiple databases. During this encounter critical care time was devoted to patient care services described in this note for 40 minutes.   Josephine Igo, DO [facility] 10/26/2020 10:05 AM

## 2020-10-26 NOTE — Progress Notes (Incomplete)
Nutrition Follow-up  DOCUMENTATION CODES:   Obesity unspecified  INTERVENTION:  *** Tube Feeding via    NUTRITION DIAGNOSIS:   Inadequate oral intake related to inability to eat as evidenced by NPO status.  ***  GOAL:   Provide needs based on ASPEN/SCCM guidelines  ***  MONITOR:   Vent status,Labs,I & O's,TF tolerance,Weight trends  REASON FOR ASSESSMENT:   Ventilator    ASSESSMENT:   Patient is a 60 yo who presents from home with acute systolic HF severe cardiomyopathy (EF-15%). Hx of alcohol/polysubstance abuse, cocaine positive.  5/23 Intubated, transferred to [facility] from [facility] and CRRT initiated  Labs: potassium 4.0 (wdl), phosphorus 4.3 (wdl) Meds:  ***   NUTRITION - FOCUSED PHYSICAL EXAM:  {RD Focused Exam List:21252}  Diet Order:   Diet Order            Diet NPO time specified  Diet effective now                 EDUCATION NEEDS:   Not appropriate for education at this time  Skin:  Skin Assessment: Reviewed RN Assessment  Last BM:  prior to admission, distended abdomen  Height:   Ht Readings from Last 1 Encounters:  10/25/20 5\' 7"  (1.702 m)    Weight:   Wt Readings from Last 1 Encounters:  10/26/20 95.9 kg    Ideal Body Weight:     BMI:  Body mass index is 33.11 kg/m.  Estimated Nutritional Needs:   Kcal:  1882  Protein:  134-147 gr  Fluid:  >1800 ml    ***

## 2020-10-26 NOTE — Progress Notes (Signed)
Pharmacy Antibiotic Note  Kyle Peck is a 60 y.o. male admitted on 10/20/2020 with sepsis.  Pharmacy has been consulted for Vancomycin dosing. CRRT started 5/23 ~ 9 pm  Plan: Vancomycin 1000 mg IV q24h   Height: 5\' 7"  (170.2 cm) Weight: 89.6 kg (197 lb 8.5 oz) IBW/kg (Calculated) : 66.1  Temp (24hrs), Avg:95.5 F (35.3 C), Min:92.48 F (33.6 C), Max:101.4 F (38.6 C)  Recent Labs  Lab 10/21/20 0033 10/21/20 0404 10/22/20 0434 10/23/20 0503 10/25/20 0537 10/25/20 0654 10/25/20 0947 10/25/20 1753 10/26/20 0241 10/26/20 0319  WBC 5.6 6.4 5.6  --  7.2  --   --   --   --  6.7  CREATININE 1.09 1.05 0.92   < > 1.82* 2.07* 1.83* 2.09* 1.40*  --   LATICACIDVEN  --   --   --   --   --  4.5* 3.8*  --  1.6  --   VANCORANDOM  --   --   --   --   --   --   --   --   --  10   < > = values in this interval not displayed.    Estimated Creatinine Clearance: 59.9 mL/min (A) (by C-G formula based on SCr of 1.4 mg/dL (H)).    No Known Allergies  Antimicrobials this admission: Vanco 5/23 >>  Zosyn 5/23 >>   Microbiology results: 5/23 BCx: ngtd 5/23 MRSA PCR: positive  6/23, PharmD, BCPS

## 2020-10-26 NOTE — H&P (View-Only) (Signed)
Advanced Heart Failure Rounding Note  PCP-Cardiologist: None   Subjective:   Transferred to [facility] from [facility] for shock treatment.   5/23- started on CVVHD. On norepi  5 mcg + dobutamine 2.5 mcg.   Made 1 liter of urine.   CO-OX 74%  Sedated on vent. FiO2 40%  Objective:   Weight Range: 95.9 kg Body mass index is 33.11 kg/m.   Vital Signs:   Temp:  [92.48 F (33.6 C)-101.4 F (38.6 C)] 95.7 F (35.4 C) (05/24 0715) Pulse Rate:  [31-121] 81 (05/24 0715) Resp:  [9-32] 18 (05/24 0715) BP: (78-121)/(56-94) 100/64 (05/24 0734) SpO2:  [92 %-100 %] 92 % (05/24 0734) FiO2 (%):  [40 %-50 %] 40 % (05/24 0734) Weight:  [95.9 kg] 95.9 kg (05/24 0500) Last BM Date:  (PTA)  Weight change: Filed Weights   10/24/20 1353 10/25/20 0608 10/26/20 0500  Weight: 90.4 kg 89.6 kg 95.9 kg    Intake/Output:   Intake/Output Summary (Last 24 hours) at 10/26/2020 0742 Last data filed at 10/26/2020 0700 Gross per 24 hour  Intake 2490.59 ml  Output 2050 ml  Net 440.59 ml      Physical Exam   CVP 15  General:  Sedated on vent  HEENT: ETT  Neck: Supple. JVP to jaw. Carotids 2+ bilat; no bruits. No lymphadenopathy or thyromegaly appreciated. RIJ HD catheter LIJ central line.  Cor: PMI nondisplaced. Regular rate & rhythm. No rubs, gallops or murmurs. Lungs: Clear Abdomen: Soft, nontender, nondistended. No hepatosplenomegaly. No bruits or masses. Good bowel sounds. Extremities: No cyanosis, clubbing, rash, R and LLE trace-1+ edema Neuro: Sedate on vent. Moves all extremities.    Telemetry   SR 70-80s   EKG    N/A  Labs    CBC Recent Labs    10/25/20 0537 10/26/20 0318 10/26/20 0319  WBC 7.2  --  6.7  HGB 13.8 12.9* 12.1*  HCT 46.8 38.0* 38.7*  MCV 92.9  --  89.2  PLT 490*  --  017   Basic Metabolic Panel Recent Labs    10/25/20 0537 10/25/20 0654 10/25/20 1753 10/26/20 0241 10/26/20 0318  NA 137   < > 136 137 139  K 6.8*   < > 6.0* 4.1 4.0  CL 101   < > 103 102   --   CO2 24   < > 23 28  --   GLUCOSE 144*   < > 152* 168*  --   BUN 26*   < > 31* 24*  --   CREATININE 1.82*   < > 2.09* 1.40*  --   CALCIUM 8.4*   < > 8.0* 8.0*  --   MG 2.1  --   --  2.0  --   PHOS  --   --   --  4.3  --    < > = values in this interval not displayed.   Liver Function Tests Recent Labs    10/25/20 0537 10/26/20 0241  AST 81* 339*  ALT 97* 335*  ALKPHOS 81 54  BILITOT 2.2* 1.7*  PROT 6.8 5.3*  ALBUMIN 3.5 2.5*   No results for input(s): LIPASE, AMYLASE in the last 72 hours. Cardiac Enzymes No results for input(s): CKTOTAL, CKMB, CKMBINDEX, TROPONINI in the last 72 hours.  BNP: BNP (last 3 results) Recent Labs    10/21/20 0033 10/23/20 0504  BNP 2,252.0* 1,685.0*    ProBNP (last 3 results) No results for input(s): PROBNP in the last 8760  hours.   D-Dimer No results for input(s): DDIMER in the last 72 hours. Hemoglobin A1C No results for input(s): HGBA1C in the last 72 hours. Fasting Lipid Panel No results for input(s): CHOL, HDL, LDLCALC, TRIG, CHOLHDL, LDLDIRECT in the last 72 hours. Thyroid Function Tests No results for input(s): TSH, T4TOTAL, T3FREE, THYROIDAB in the last 72 hours.  Invalid input(s): FREET3  Other results:   Imaging    DG CHEST PORT 1 VIEW  Result Date: 10/26/2020 CLINICAL DATA:  Intubation.  Respiratory failure. EXAM: PORTABLE CHEST 1 VIEW COMPARISON:  10/25/2020. FINDINGS: Left IJ line noted with its tip over the right brachiocephalic vein. Endotracheal tube, NG tube, right IJ line in stable position. Cardiomegaly again noted. Low lung volumes with bibasilar atelectasis. Diffuse mild bilateral pulmonary infiltrates/edema. Small left pleural effusion. No pneumothorax. IMPRESSION: 1. Left IJ line noted with tip over the right back swelling vein. Endotracheal tube, NG tube, right IJ line stable position. 2.  Cardiomegaly again noted. 3. Low lung volumes with bibasilar atelectasis. Diffuse mild bilateral pulmonary  infiltrates/edema. Small left pleural effusion. Electronically Signed   By: Marcello Moores  Register   On: 10/26/2020 06:12   DG CHEST PORT 1 VIEW  Result Date: 10/25/2020 CLINICAL DATA:  Left IJ placement EXAM: PORTABLE CHEST 1 VIEW COMPARISON:  10/25/2020, 9:15 a.m. FINDINGS: Interval placement of left neck vascular catheter, tip projecting in the vicinity of the brachiocephalic confluence, directed slightly superiorly, possibly with azygos placement. Unchanged right neck multi lumen vascular catheter, tip projecting over the lower SVC. Otherwise unchanged examination with endotracheal tube, esophagogastric tube, cardiomegaly, and left pleural effusion and or atelectasis. IMPRESSION: 1. Interval placement of left neck vascular catheter, tip projecting in the vicinity of the brachiocephalic confluence, directed slightly superiorly, possibly with azygos placement. 2. Otherwise unchanged examination with support apparatus as detailed above. Electronically Signed   By: Eddie Candle M.D.   On: 10/25/2020 19:57   DG Chest Port 1V same Day  Result Date: 10/25/2020 CLINICAL DATA:  Status post central line placement for dialysis. EXAM: PORTABLE CHEST 1 VIEW COMPARISON:  Single-view of the chest earlier today. FINDINGS: New double lumen right IJ approach dialysis catheter is in place. Tip of the catheter projects in the mid superior vena cava. ETT and NG tube are unchanged. No pneumothorax. Marked enlargement of the cardiopericardial silhouette again seen. Left basilar opacity is unchanged. IMPRESSION: New right IJ approach dialysis catheter tip projects in the mid superior vena cava. Negative for pneumothorax. No change in left basilar airspace opacity likely due to atelectasis. Marked enlargement of the cardiopericardial silhouette could be due to cardiomegaly and/or pericardial effusion. Electronically Signed   By: Inge Rise M.D.   On: 10/25/2020 09:48      Medications:     Scheduled Medications: .  chlorhexidine gluconate (MEDLINE KIT)  15 mL Mouth Rinse BID  . Chlorhexidine Gluconate Cloth  6 each Topical Q0600  . docusate  50 mg Per Tube Daily  . enoxaparin (LOVENOX) injection  40 mg Subcutaneous Q24H  . feeding supplement  237 mL Per Tube BID BM  . feeding supplement (PROSource TF)  45 mL Per Tube BID  . feeding supplement (VITAL HIGH PROTEIN)  1,000 mL Per Tube Q24H  . folic acid  1 mg Per Tube Daily  . hydrocortisone sod succinate (SOLU-CORTEF) inj  100 mg Intravenous Q8H  . mouth rinse  15 mL Mouth Rinse 10 times per day  . multivitamin  15 mL Per Tube Daily  . mupirocin ointment  1 application Nasal BID  . pantoprazole (PROTONIX) IV  40 mg Intravenous Q24H  . polyethylene glycol  17 g Oral Daily  . thiamine  100 mg Per Tube Daily   Or  . thiamine  100 mg Intravenous Daily     Infusions: .  prismasol BGK 4/2.5 400 mL/hr at 10/26/20 0646  .  prismasol BGK 4/2.5 400 mL/hr at 10/26/20 0645  . sodium chloride    . dexmedetomidine (PRECEDEX) IV infusion Stopped (10/25/20 0415)  . DOBUTamine    . fentaNYL infusion INTRAVENOUS 95 mcg/hr (10/26/20 0700)  . midazolam 2 mg/hr (10/26/20 0700)  . norepinephrine (LEVOPHED) Adult infusion 4 mcg/min (10/26/20 0700)  . piperacillin-tazobactam Stopped (10/26/20 0546)  . prismasol BGK 4/2.5 1,500 mL/hr at 10/26/20 0725  . vancomycin 200 mL/hr at 10/26/20 0700  . vasopressin Stopped (10/25/20 1442)     PRN Medications:  diphenhydrAMINE, haloperidol lactate, heparin, LORazepam **OR** LORazepam, ondansetron (ZOFRAN) IV, sodium chloride     Assessment/Plan  1. Shock -Suspect Cardiogenic  -Procalcitonin 0.46.  -Lactic Acid 4.5 >3.8> 1.6   -Blood Cx pending - Remains hypotensive.  -On Norepi 4 + dobutamine 2.5 . CO-OX 74%   2. Acute Systolic HF -? ETOH induced. 5/23 204>141. If improves will need cath.  -Echo EF 15% with mild RV failure -Starting on CVVHD.  - See above.  - On norepi + dobutamine  3. Acute Hypoxic  Respiratory Failure -Intubated 5/22 / CCM managing.   4. AKI -Creatinine on admit 0.9 - Starting CVVHD 5/23   5. Hyperkalemia  -6.9 peaked.  -Given insulin/dextrose/lokelma  - On CVVHD   6. Polysubstance Abuse -UDS + cocaine, benzo, and THC  7. ETOH Abuse -Started withdrawing 5/21.Given ativan   Length of Stay: [place], NP  10/26/2020, 7:42 AM  Advanced Heart Failure Team Pager [phone] (M-F; 7a - 5p)  Please contact [facility] for night-coverage after hours (5p -7a ) and weekends on [email or web address]  Patient seen with NP, agree with the above note.   Currently on CVVH pulling net UF 50 cc/hr.  Made 1000 cc urine.  Co-ox 74%, lactate 1.6.  CVP 15.  He remains on NE 5, dobutamine 2.5.  Currently sedated, per nurse he will awaken and follow commands but gets agitated.   Afebrile, PCT 1.5. MRSA on blood culture. Now on Zosyn + vancomycin.   General: Sedated on vent Neck: JVP 12+ cm, no thyromegaly or thyroid nodule.  Lungs: Coarse BS CV: Nondisplaced PMI.  Heart regular S1/S2, no S3/S4, no murmur.  No peripheral edema.    Abdomen: Soft, nontender, no hepatosplenomegaly, no distention.  Skin: Intact without lesions or rashes.  Neurologic: Will awaken, follow commands.  Extremities: No clubbing or cyanosis.  HEENT: Normal.   1. Shock: Suspect primarily cardiogenic but may be septic component with MRSA on blood cultures.  - Currently on NE 5 + dobutamine 2.5 with stable MAP. Wean NE as able.  2. Acute systolic CHF: Echo this admission with EF 15%, moderate LV dilation, mildly decreased RV function, mild MR.  Cause uncertain, could be due to ETOH abuse but cannot rule out CAD. Presentation consistent with CHF, not ACS.  HS-TnI in setting of hypotension/shock was 204 => 141. CVP 15 today on CVVH, co-ox 74% on NE 5 + dobutamine 2.5.  - Continue CVVH aim for net UF 50-100 cc/hr  - Wean down NE as needed. - Continue dobutamine 2.5.  - Ideally would eventually get  right/left cath, will depend on  trajectory of renal function.  3. AKI: Creatinine up to 2.09 with persistent hyperkalemia and minimal UOP.  Patient was transferred from [facility] to start CVVH, renal following closely.  Now on CVVH, HCO3 gtt stopped.  Made 1000 cc urine over last day.  - Continue gentle UF via CVVH, would like to see net negative 50-100 cc/hr today.  4. ETOH abuse: With withdrawal.  5. Cocaine abuse: Apparently this is not chronic.  6. Acute hypoxemic respiratory failure: Initially in setting of ETOH withdrawal and altered mental status.  - Vent per CCM.  7. ID: MRSA in blood, ?PNA source.  PCT 1.5.  - Continue vancomycin/Zosyn.  - TEE tomorrow to look for endocarditis.   CRITICAL CARE Performed by: Loralie Champagne  Total critical care time: 35 minutes  Critical care time was exclusive of separately billable procedures and treating other patients.  Critical care was necessary to treat or prevent imminent or life-threatening deterioration.  Critical care was time spent personally by me on the following activities: development of treatment plan with patient and/or surrogate as well as nursing, discussions with consultants, evaluation of patient's response to treatment, examination of patient, obtaining history from patient or surrogate, ordering and performing treatments and interventions, ordering and review of laboratory studies, ordering and review of radiographic studies, pulse oximetry and re-evaluation of patient's condition.  Loralie Champagne 10/26/2020 8:56 AM

## 2020-10-26 NOTE — Consult Note (Addendum)
[facility] for Infectious Diseases                                                                                        Patient Identification: Patient Name: Kyle Peck MRN: [id number] [place] Date: 10/20/2020 11:52 PM Today's Date: 10/26/2020 Reason for consult: MRSA bacteremia Requesting provider: Johnnye Lana consult  Principal Problem:   Elevated brain natriuretic peptide (BNP) level Active Problems:   CHF (congestive heart failure) (HCC)   Transaminitis   Thrombocytosis   Hypoalbuminemia due to protein-calorie malnutrition (HCC)   Tobacco use   Pressure injury of skin   Acute respiratory failure (HCC)   Cardiogenic shock (HCC)   Hyperkalemia   Acute renal failure (ARF) (HCC)   Acute encephalopathy   Polysubstance abuse (HCC)   ETOH abuse   Antibiotics: Vancomycin 5/23-current                    Zosyn 5/23-current  Lines/Tubes: Hemodialysis IJ catheter, left IJ catheter, PIV's  Assessment MRSA bacteremia  TTE is negative for vegetations  ? Nosocomial infection as patient develop bacteremia several days into admission   Acute systolic CHF Combined shock-septic and cardiogenic, on vasopressors Acute respiratory failure s/p intubation  Transaminitis-in the setting of shock, acute hep panel is negative Polysubstance abuse Sacral Ulcer    Recommendations  Continue vancomycin, pharmacy to dose Repeat blood cultures, 2 sets tomorrow TEE planned for tomorrow, we will follow-up with results Monitor CBC BMP and vancomycin trough Wound care for sacral ulcer  Will need metastatic work up for staph bacteremia when able  Following   Rest of the management as per the primary team. Please call with questions or concerns.  Thank you for the consult  Rosiland Oz, MD Infectious Disease Physician [facility] for Infectious Disease [address] [place], [place]  [address] Phone: [phone]  Fax: [phone]  __________________________________________________________________________________________________________ HPI and Hospital Course: 60 year old male with past medical history of arthritis, history of ethanol, marijuana and cocaine use who was transferred from [facility] for higher level of care in the setting of acute CHF exacerbation. Patient initially presented to [facility] with bilateral knee pain, leg, scrotal and abdominal swelling.  TTE with left ventricular ejection fraction 15%, mild calcification of the aortic valve and mild thickening of the aortic valve. Started on IV diuretics.  Cardiology advised cardiac cath after medical management.  He was also treated for alcohol withdrawal with Ativan and transferred to MICU on 5/22 and placed on Precedex but eventually required to be intubated, hypotensive requiring vasopressors.  Also developed AKI and hyperkalemia. Transferred to [facility] for higher level of care.  ID consulted for MRSA bacteremia   ROS: unavailable as patient is intubated   Past Medical History:  Diagnosis Date  . Alcohol use   . Arthritis   . Marijuana use   . Tobacco abuse    Past Surgical History:  Procedure Laterality Date  . FRACTURE SURGERY     cheek     Scheduled Meds: . chlorhexidine gluconate (MEDLINE KIT)  15 mL Mouth Rinse BID  . Chlorhexidine Gluconate Cloth  6 each Topical Q0600  . docusate  50 mg Per Tube Daily  . enoxaparin (LOVENOX) injection  40 mg Subcutaneous Q24H  . feeding supplement  237 mL Per Tube BID BM  . feeding supplement (PROSource TF)  45 mL Per Tube BID  . feeding supplement (VITAL HIGH PROTEIN)  1,000 mL Per Tube Q24H  . folic acid  1 mg Per Tube Daily  . hydrocortisone sod succinate (SOLU-CORTEF) inj  100 mg Intravenous Q8H  . mouth rinse  15 mL Mouth Rinse 10 times per day  . multivitamin  15 mL Per Tube Daily  . mupirocin ointment  1 application Nasal BID  . pantoprazole (PROTONIX)  IV  40 mg Intravenous Q24H  . polyethylene glycol  17 g Oral Daily  . thiamine  100 mg Per Tube Daily   Or  . thiamine  100 mg Intravenous Daily   Continuous Infusions: .  prismasol BGK 4/2.5 400 mL/hr at 10/26/20 0646  .  prismasol BGK 4/2.5 400 mL/hr at 10/26/20 0645  . sodium chloride    . dexmedetomidine (PRECEDEX) IV infusion Stopped (10/25/20 0415)  . DOBUTamine    . fentaNYL infusion INTRAVENOUS 95 mcg/hr (10/26/20 0800)  . midazolam 4 mg/hr (10/26/20 0822)  . norepinephrine (LEVOPHED) Adult infusion 5 mcg/min (10/26/20 0800)  . piperacillin-tazobactam Stopped (10/26/20 0546)  . prismasol BGK 4/2.5 1,500 mL/hr at 10/26/20 0725  . vancomycin Stopped (10/26/20 0714)  . vasopressin Stopped (10/25/20 1442)   PRN Meds:.diphenhydrAMINE, haloperidol lactate, heparin, LORazepam **OR** LORazepam, ondansetron (ZOFRAN) IV, sodium chloride  No Known Allergies  Social History   Socioeconomic History  . Marital status: Married    Spouse name: [name]  . Number of children: Not on file  . Years of education: Not on file  . Highest education level: Not on file  Occupational History  . Not on file  Tobacco Use  . Smoking status: Current Some Day Smoker    Packs/day: 0.50    Types: Cigars, Cigarettes  . Smokeless tobacco: Never Used  Vaping Use  . Vaping Use: Never used  Substance and Sexual Activity  . Alcohol use: Yes    Alcohol/week: 15.0 standard drinks    Types: 15 Shots of liquor per week    Comment: intermittently  . Drug use: Yes    Frequency: 2.0 times per week    Types: Marijuana    Comment: "also around cocaine before"  . Sexual activity: Not on file  Other Topics Concern  . Not on file  Social History Narrative  . Not on file   Social Determinants of Health   Financial Resource Strain: Not on file  Food Insecurity: Not on file  Transportation Needs: Not on file  Physical Activity: Not on file  Stress: Not on file  Social Connections: Not on file   Intimate Partner Violence: Not on file    Family h/o - unobtainable   Vitals BP 114/82   Pulse 100   Temp (!) 96.8 F (36 C)   Resp 16   Ht '5\' 7"'  (1.702 m)   Wt 95.9 kg   SpO2 93%   BMI 33.11 kg/m    Physical Exam Constitutional: Intubated and sedated, on vasopressors    Comments:   Cardiovascular:     Rate and Rhythm: Normal rate and regular rhythm.     Heart sounds: No murmur heard.   Pulmonary:     Effort: Loud vent sounds    Comments:   Abdominal:  Palpations: Abdomen is soft.     Tenderness: Nondistended  Musculoskeletal:        General: No swelling  of peripheral joints  Skin:    Comments: No obvious rashes or lesions  Neurological:     General: Unable to assess  Psychiatric:        Mood and Affect: Unable to assess   Pertinent Microbiology Results for orders placed or performed during the hospital encounter of 10/20/20  SARS CORONAVIRUS 2 (TAT 6-24 HRS) Nasopharyngeal Nasopharyngeal Swab     Status: None   Collection Time: 10/21/20  2:00 AM   Specimen: Nasopharyngeal Swab  Result Value Ref Range Status   SARS Coronavirus 2 NEGATIVE NEGATIVE Final    Comment: (NOTE) SARS-CoV-2 target nucleic acids are NOT DETECTED.  The SARS-CoV-2 RNA is generally detectable in upper and lower respiratory specimens during the acute phase of infection. Negative results do not preclude SARS-CoV-2 infection, do not rule out co-infections with other pathogens, and should not be used as the sole basis for treatment or other patient management decisions. Negative results must be combined with clinical observations, patient history, and epidemiological information. The expected result is Negative.  Fact Sheet for Patients: SugarRoll.be  Fact Sheet for Healthcare Providers: https://www.woods-mathews.com/  This test is not yet approved or cleared by the [place] FDA and  has been authorized for detection and/or  diagnosis of SARS-CoV-2 by FDA under an Emergency Use Authorization (EUA). This EUA will remain  in effect (meaning this test can be used) for the duration of the COVID-19 declaration under Se ction 564(b)(1) of the Act, 21 U.S.C. section 360bbb-3(b)(1), unless the authorization is terminated or revoked sooner.  Performed at [facility], [place] [address] [place], [place] [address]   MRSA PCR Screening     Status: Abnormal   Collection Time: 10/25/20  6:30 AM   Specimen: Nasal Mucosa; Nasopharyngeal  Result Value Ref Range Status   MRSA by PCR POSITIVE (A) NEGATIVE Final    Comment:        The GeneXpert MRSA Assay (FDA approved for NASAL specimens only), is one component of a comprehensive MRSA colonization surveillance program. It is not intended to diagnose MRSA infection nor to guide or monitor treatment for MRSA infections. RESULT CALLED TO, READ BACK BY AND VERIFIED WITH: FOLEY,B. AT 0845 BY HUFFINES,S ON 10/25/20. Performed at [facility], [address], [place], [place] [address]   Culture, blood (routine x 2)     Status: None (Preliminary result)   Collection Time: 10/25/20  8:06 AM   Specimen: BLOOD  Result Value Ref Range Status   Specimen Description BLOOD RIGHT ANTECUBITAL  Final   Special Requests   Final    BOTTLES DRAWN AEROBIC AND ANAEROBIC Blood Culture adequate volume   Culture  Setup Time   Final    AEROBIC BOTTLE ONLY GRAM POSITIVE COCCI Gram Stain Report Called to,Read Back By and Verified With: W COUNCIL,RN'@0200'  10/26/20 MKELLY    Culture   Final    NO GROWTH < 12 HOURS Performed at [facility], [address], [place], [place] [address]    Report Status PENDING  Incomplete  Culture, blood (routine x 2)     Status: None (Preliminary result)   Collection Time: 10/25/20  8:06 AM   Specimen: BLOOD  Result Value Ref Range Status   Specimen Description   Final    BLOOD RIGHT WRIST Performed at [facility], [address] [place], [place]  [address]  Special Requests   Final    BOTTLES DRAWN AEROBIC ONLY Blood Culture adequate volume Performed at [facility], [address], [place], [place] [address]    Culture  Setup Time   Final    AEROBIC BOTTLE ONLY GRAM POSITIVE COCCI Gram Stain Report Called to,Read Back By and Verified With: W COUNCIL,RN'@2204'  10/25/20 MKELLY CRITICAL RESULT CALLED TO, READ BACK BY AND VERIFIED WITH: PHARMD GREG ABBOTT 10/26/2020 AT 0440 A.HUGHES Performed at [facility], [place] [address], [place], [place] [address]    Culture [facility] POSITIVE COCCI  Final   Report Status PENDING  Incomplete  Blood Culture ID Panel (Reflexed)     Status: Abnormal   Collection Time: 10/25/20  8:06 AM  Result Value Ref Range Status   Enterococcus faecalis NOT DETECTED NOT DETECTED Final   Enterococcus Faecium NOT DETECTED NOT DETECTED Final   Listeria monocytogenes NOT DETECTED NOT DETECTED Final   Staphylococcus species DETECTED (A) NOT DETECTED Final    Comment: RBV PHARMD GREG ABBOTT 10/26/2020 AT 0441 A.HUGHES    Staphylococcus aureus (BCID) DETECTED (A) NOT DETECTED Final    Comment: Methicillin (oxacillin)-resistant Staphylococcus aureus (MRSA). MRSA is predictably resistant to beta-lactam antibiotics (except ceftaroline). Preferred therapy is vancomycin unless clinically contraindicated. Patient requires contact precautions if  hospitalized. RBV PHARMD GREG ABBOTT 10/26/2020 AT 0440 A.HUGHES    Staphylococcus epidermidis NOT DETECTED NOT DETECTED Final   Staphylococcus lugdunensis NOT DETECTED NOT DETECTED Final   Streptococcus species NOT DETECTED NOT DETECTED Final   Streptococcus agalactiae NOT DETECTED NOT DETECTED Final   Streptococcus pneumoniae NOT DETECTED NOT DETECTED Final   Streptococcus pyogenes NOT DETECTED NOT DETECTED Final   A.calcoaceticus-baumannii NOT DETECTED NOT DETECTED Final   Bacteroides fragilis NOT DETECTED NOT DETECTED Final   Enterobacterales NOT DETECTED NOT DETECTED Final    Enterobacter cloacae complex NOT DETECTED NOT DETECTED Final   Escherichia coli NOT DETECTED NOT DETECTED Final   Klebsiella aerogenes NOT DETECTED NOT DETECTED Final   Klebsiella oxytoca NOT DETECTED NOT DETECTED Final   Klebsiella pneumoniae NOT DETECTED NOT DETECTED Final   Proteus species NOT DETECTED NOT DETECTED Final   Salmonella species NOT DETECTED NOT DETECTED Final   Serratia marcescens NOT DETECTED NOT DETECTED Final   Haemophilus influenzae NOT DETECTED NOT DETECTED Final   Neisseria meningitidis NOT DETECTED NOT DETECTED Final   Pseudomonas aeruginosa NOT DETECTED NOT DETECTED Final   Stenotrophomonas maltophilia NOT DETECTED NOT DETECTED Final   Candida albicans NOT DETECTED NOT DETECTED Final   Candida auris NOT DETECTED NOT DETECTED Final   Candida glabrata NOT DETECTED NOT DETECTED Final   Candida krusei NOT DETECTED NOT DETECTED Final   Candida parapsilosis NOT DETECTED NOT DETECTED Final   Candida tropicalis NOT DETECTED NOT DETECTED Final   Cryptococcus neoformans/gattii NOT DETECTED NOT DETECTED Final   Meth resistant mecA/C and MREJ DETECTED (A) NOT DETECTED Final    Comment: PHARMD GREG ABBOTT 10/26/2020 AT 0441 A.HUGHES CRITICAL RESULT CALLED TO, READ BACK BY AND VERIFIED WITH: Performed at [facility], [place] [address] [place], [place] [address]     Pertinent Lab seen by me: CBC Latest Ref Rng & Units 10/26/2020 10/26/2020 10/25/2020  WBC 4.0 - 10.5 K/uL 6.7 - 7.2  Hemoglobin 13.0 - 17.0 g/dL 12.1(L) 12.9(L) 13.8  Hematocrit 39.0 - 52.0 % 38.7(L) 38.0(L) 46.8  Platelets 150 - 400 K/uL 280 - 490(H)   CMP Latest Ref Rng & Units 10/26/2020 10/26/2020 10/25/2020  Glucose 70 - 99  mg/dL - 168(H) 152(H)  BUN 6 - 20 mg/dL - 24(H) 31(H)  Creatinine 0.61 - 1.24 mg/dL - 1.40(H) 2.09(H)  Sodium 135 - 145 mmol/L 139 137 136  Potassium 3.5 - 5.1 mmol/L 4.0 4.1 6.0(H)  Chloride 98 - 111 mmol/L - 102 103  CO2 22 - 32 mmol/L - 28 23  Calcium 8.9 - 10.3 mg/dL -  8.0(L) 8.0(L)  Total Protein 6.5 - 8.1 g/dL - 5.3(L) -  Total Bilirubin 0.3 - 1.2 mg/dL - 1.7(H) -  Alkaline Phos 38 - 126 U/L - 54 -  AST 15 - 41 U/L - 339(H) -  ALT 0 - 44 U/L - 335(H) -     Pertinent Imagings/Other Imagings Plain films and CT images have been personally visualized and interpreted; radiology reports have been reviewed. Decision making incorporated into the Impression / Recommendations.  TTE 10/21/20 IMPRESSIONS   1. Left ventricular ejection fraction, by estimation, is 15%. The left  ventricle has severely decreased function. The left ventricle demonstrates  global hypokinesis. The left ventricular internal cavity size was  moderately dilated. Left ventricular  diastolic parameters are indeterminate.  2. Right ventricular systolic function is mildly reduced. The right  ventricular size is moderately enlarged.  3. Left atrial size was severely dilated.  4. Right atrial size was mildly dilated.  5. The mitral valve is normal in structure. Mild mitral valve  regurgitation. No evidence of mitral stenosis.  6. The aortic valve has an indeterminant number of cusps. There is mild  calcification of the aortic valve. There is mild thickening of the aortic  valve. Aortic valve regurgitation is not visualized. No aortic stenosis is  present.  7. The inferior vena cava is dilated in size with <50% respiratory  variability, suggesting right atrial pressure of 15 mmHg.   I have spent more than 70  minutes for this patient encounter including review of prior medical records with greater than 50% of time being face to face and coordination of their care.  Electronically signed by:   Rosiland Oz, MD Infectious Disease Physician [facility] for Infectious Disease Pager: [phone]

## 2020-10-26 NOTE — Progress Notes (Signed)
eLink Physician-Brief Progress Note Patient Name: Kyle Peck DOB: 11-24-1960 MRN: [id number]   Date of Service  10/26/2020  HPI/Events of Note  Nursing request for ABG and CXR in AM.  eICU Interventions  Will order portable CXR and ABG at 5 AM.     Intervention Category Major Interventions: Other:  [name] 10/26/2020, 2:39 AM

## 2020-10-27 ENCOUNTER — Inpatient Hospital Stay (HOSPITAL_COMMUNITY): Payer: Self-pay

## 2020-10-27 DIAGNOSIS — R7881 Bacteremia: Secondary | ICD-10-CM

## 2020-10-27 DIAGNOSIS — B9562 Methicillin resistant Staphylococcus aureus infection as the cause of diseases classified elsewhere: Secondary | ICD-10-CM

## 2020-10-27 DIAGNOSIS — I509 Heart failure, unspecified: Secondary | ICD-10-CM

## 2020-10-27 DIAGNOSIS — F1410 Cocaine abuse, uncomplicated: Secondary | ICD-10-CM

## 2020-10-27 DIAGNOSIS — G9340 Encephalopathy, unspecified: Secondary | ICD-10-CM

## 2020-10-27 LAB — COMPREHENSIVE METABOLIC PANEL
ALT: 388 U/L — ABNORMAL HIGH (ref 0–44)
AST: 301 U/L — ABNORMAL HIGH (ref 15–41)
Albumin: 2.7 g/dL — ABNORMAL LOW (ref 3.5–5.0)
Alkaline Phosphatase: 56 U/L (ref 38–126)
Anion gap: 3 — ABNORMAL LOW (ref 5–15)
BUN: 21 mg/dL — ABNORMAL HIGH (ref 6–20)
CO2: 29 mmol/L (ref 22–32)
Calcium: 8 mg/dL — ABNORMAL LOW (ref 8.9–10.3)
Chloride: 104 mmol/L (ref 98–111)
Creatinine, Ser: 0.88 mg/dL (ref 0.61–1.24)
GFR, Estimated: >60 mL/min (ref >60)
Glucose, Bld: 162 mg/dL — ABNORMAL HIGH (ref 70–99)
Potassium: 4.7 mmol/L (ref 3.5–5.1)
Sodium: 136 mmol/L (ref 135–145)
Total Bilirubin: 1.2 mg/dL (ref 0.3–1.2)
Total Protein: 5.5 g/dL — ABNORMAL LOW (ref 6.5–8.1)

## 2020-10-27 LAB — COOXEMETRY PANEL
Carboxyhemoglobin: 1.2 % (ref 0.5–1.5)
Methemoglobin: 0.6 % (ref 0.0–1.5)
O2 Saturation: 71.5 %
Total hemoglobin: 12.2 g/dL (ref 12.0–16.0)

## 2020-10-27 LAB — LIPID PANEL
Cholesterol: 103 mg/dL (ref 0–200)
HDL: 24 mg/dL — ABNORMAL LOW (ref >40)
LDL Cholesterol: 65 mg/dL (ref 0–99)
Total CHOL/HDL Ratio: 4.3 RATIO
Triglycerides: 68 mg/dL (ref <150)
VLDL: 14 mg/dL (ref 0–40)

## 2020-10-27 LAB — HEPATITIS PANEL, ACUTE
HCV Ab: NONREACTIVE
Hep A IgM: NONREACTIVE
Hep B C IgM: NONREACTIVE
Hepatitis B Surface Ag: NONREACTIVE

## 2020-10-27 LAB — RENAL FUNCTION PANEL
Albumin: 2.6 g/dL — ABNORMAL LOW (ref 3.5–5.0)
Anion gap: 4 — ABNORMAL LOW (ref 5–15)
BUN: 19 mg/dL (ref 6–20)
CO2: 27 mmol/L (ref 22–32)
Calcium: 8.1 mg/dL — ABNORMAL LOW (ref 8.9–10.3)
Chloride: 104 mmol/L (ref 98–111)
Creatinine, Ser: 0.76 mg/dL (ref 0.61–1.24)
GFR, Estimated: >60 mL/min (ref >60)
Glucose, Bld: 152 mg/dL — ABNORMAL HIGH (ref 70–99)
Phosphorus: 2.7 mg/dL (ref 2.5–4.6)
Potassium: 4.6 mmol/L (ref 3.5–5.1)
Sodium: 135 mmol/L (ref 135–145)

## 2020-10-27 LAB — GLUCOSE, CAPILLARY
Glucose-Capillary: 105 mg/dL — ABNORMAL HIGH (ref 70–99)
Glucose-Capillary: 133 mg/dL — ABNORMAL HIGH (ref 70–99)
Glucose-Capillary: 146 mg/dL — ABNORMAL HIGH (ref 70–99)
Glucose-Capillary: 155 mg/dL — ABNORMAL HIGH (ref 70–99)
Glucose-Capillary: 165 mg/dL — ABNORMAL HIGH (ref 70–99)
Glucose-Capillary: 176 mg/dL — ABNORMAL HIGH (ref 70–99)

## 2020-10-27 LAB — PHOSPHORUS: Phosphorus: 2.4 mg/dL — ABNORMAL LOW (ref 2.5–4.6)

## 2020-10-27 LAB — PROCALCITONIN: Procalcitonin: 1.04 ng/mL

## 2020-10-27 LAB — MAGNESIUM: Magnesium: 2.4 mg/dL (ref 1.7–2.4)

## 2020-10-27 MED ORDER — QUETIAPINE FUMARATE 25 MG PO TABS
25.0000 mg | ORAL_TABLET | Freq: Two times a day (BID) | ORAL | Status: DC
  Administered 2020-10-27: 25 mg via ORAL
  Filled 2020-10-27: qty 1

## 2020-10-27 MED ORDER — SODIUM CHLORIDE 0.9% FLUSH
10.0000 mL | Freq: Two times a day (BID) | INTRAVENOUS | Status: DC
  Administered 2020-10-27 – 2020-10-30 (×6): 10 mL
  Administered 2020-10-31: 30 mL
  Administered 2020-11-01 – 2020-11-03 (×4): 10 mL
  Administered 2020-11-03: 30 mL
  Administered 2020-11-04: 10 mL

## 2020-10-27 MED ORDER — PROPOFOL 1000 MG/100ML IV EMUL
INTRAVENOUS | Status: AC
  Administered 2020-10-27: 50 ug/kg/min via INTRAVENOUS
  Filled 2020-10-27: qty 100

## 2020-10-27 MED ORDER — HYDROCORTISONE NA SUCCINATE PF 100 MG IJ SOLR
50.0000 mg | Freq: Two times a day (BID) | INTRAMUSCULAR | Status: AC
  Administered 2020-10-27 – 2020-10-28 (×3): 50 mg via INTRAVENOUS
  Filled 2020-10-27 (×4): qty 2

## 2020-10-27 MED ORDER — PROPOFOL 1000 MG/100ML IV EMUL: 0.0000 ug/kg/min | INTRAVENOUS | Status: DC

## 2020-10-27 MED ORDER — SODIUM CHLORIDE 0.9% FLUSH: 10.0000 mL | INTRAVENOUS | Status: DC | PRN

## 2020-10-27 MED ORDER — QUETIAPINE FUMARATE 25 MG PO TABS
25.0000 mg | ORAL_TABLET | Freq: Once | ORAL | Status: AC
  Administered 2020-10-27: 25 mg via ORAL
  Filled 2020-10-27: qty 1

## 2020-10-27 MED ORDER — QUETIAPINE FUMARATE 50 MG PO TABS
50.0000 mg | ORAL_TABLET | Freq: Two times a day (BID) | ORAL | Status: DC
  Administered 2020-10-27 – 2020-10-28 (×3): 50 mg via ORAL
  Filled 2020-10-27 (×3): qty 1

## 2020-10-27 MED ORDER — SODIUM PHOSPHATES 45 MMOLE/15ML IV SOLN
20.0000 mmol | Freq: Once | INTRAVENOUS | Status: AC
  Administered 2020-10-27: 20 mmol via INTRAVENOUS
  Filled 2020-10-27: qty 6.67

## 2020-10-27 NOTE — Progress Notes (Signed)
Advanced Heart Failure Rounding Note  PCP-Cardiologist: None   Subjective:    Transferred to [facility] from [facility] for shock treatment.   On norepi  3 mcg + dobutamine 2.5 mcg/kg/min, co-ox 71.5% with morning.     Continues on CVVH, UOP 554.  CVP 10 this morning, UF ongoing at 50-100 cc/hr over the last day.  Weight down.   Sedated on vent. FiO2 40%   MRSA bacteremia, on vancomycin.  TEE today: EF 20%, mild RV dysfunction, no endocarditis.   Objective:   Weight Range: 90.1 kg Body mass index is 31.11 kg/m.   Vital Signs:   Temp:  [95.36 F (35.2 C)-98.2 F (36.8 C)] 96.44 F (35.8 C) (05/25 0800) Pulse Rate:  [71-110] 86 (05/25 0800) Resp:  [6-20] 18 (05/25 0800) BP: (84-123)/(55-90) 108/76 (05/25 0811) SpO2:  [91 %-100 %] 100 % (05/25 0811) FiO2 (%):  [40 %] 40 % (05/25 0811) Weight:  [90.1 kg] 90.1 kg (05/25 0400) Last BM Date:  (PTA)  Weight change: Filed Weights   10/25/20 0608 10/26/20 0500 10/27/20 0400  Weight: 89.6 kg 95.9 kg 90.1 kg    Intake/Output:   Intake/Output Summary (Last 24 hours) at 10/27/2020 0904 Last data filed at 10/27/2020 0800 Gross per 24 hour  Intake 1793.05 ml  Output 4472 ml  Net -2678.95 ml      Physical Exam   CVP 10 General: Sedated on vent Neck: JVP 10 cm, no thyromegaly or thyroid nodule.  Lungs: Mildly decreased at bases.  CV: Nondisplaced PMI.  Heart regular S1/S2, no S3/S4, no murmur.  No peripheral edema.   Abdomen: Soft, nontender, no hepatosplenomegaly, no distention.  Skin: Intact without lesions or rashes.  Neurologic: Wakes up and follows commands per nursing.  Extremities: No clubbing or cyanosis.  HEENT: Normal.    Telemetry   SR 70-80s (personally reviewed).   EKG    N/A  Labs    CBC Recent Labs    10/25/20 0537 10/26/20 0318 10/26/20 0319  WBC 7.2  --  6.7  HGB 13.8 12.9* 12.1*  HCT 46.8 38.0* 38.7*  MCV 92.9  --  89.2  PLT 490*  --  594   Basic Metabolic Panel Recent Labs     10/26/20 0241 10/26/20 0318 10/26/20 1701 10/27/20 0414  NA 137   < > 135 136  K 4.1   < > 4.6 4.7  CL 102  --  103 104  CO2 28  --  27 29  GLUCOSE 168*  --  151* 162*  BUN 24*  --  23* 21*  CREATININE 1.40*  --  1.09 0.88  CALCIUM 8.0*  --  8.0* 8.0*  MG 2.0  --   --  2.4  PHOS 4.3  --  3.2 2.4*   < > = values in this interval not displayed.   Liver Function Tests Recent Labs    10/26/20 0241 10/26/20 1701 10/27/20 0414  AST 339*  --  301*  ALT 335*  --  388*  ALKPHOS 54  --  56  BILITOT 1.7*  --  1.2  PROT 5.3*  --  5.5*  ALBUMIN 2.5* 2.7* 2.7*   No results for input(s): LIPASE, AMYLASE in the last 72 hours. Cardiac Enzymes No results for input(s): CKTOTAL, CKMB, CKMBINDEX, TROPONINI in the last 72 hours.  BNP: BNP (last 3 results) Recent Labs    10/21/20 0033 10/23/20 0504  BNP 2,252.0* 1,685.0*    ProBNP (last 3 results) No  results for input(s): PROBNP in the last 8760 hours.   D-Dimer No results for input(s): DDIMER in the last 72 hours. Hemoglobin A1C No results for input(s): HGBA1C in the last 72 hours. Fasting Lipid Panel Recent Labs    10/27/20 0414  CHOL 103  HDL 24*  LDLCALC 65  TRIG 68  CHOLHDL 4.3   Thyroid Function Tests No results for input(s): TSH, T4TOTAL, T3FREE, THYROIDAB in the last 72 hours.  Invalid input(s): FREET3  Other results:   Imaging    DG Abd 1 View  Result Date: 10/26/2020 CLINICAL DATA:  OG tube placement. EXAM: ABDOMEN - 1 VIEW COMPARISON:  None. FINDINGS: Tip and side port of the enteric tube below the diaphragm in the stomach. No bowel dilatation to suggest obstruction in the visualized abdomen. IMPRESSION: Tip and side port of the enteric tube below the diaphragm in the stomach. Electronically Signed   By: Keith Rake M.D.   On: 10/26/2020 16:18   DG CHEST PORT 1 VIEW  Result Date: 10/27/2020 CLINICAL DATA:  Respiratory failure. EXAM: PORTABLE CHEST 1 VIEW COMPARISON:  10/26/2020. FINDINGS:  Endotracheal tube, NG tube, right IJ line stable position. Left IJ line noted with tip over SVC. Cardiomegaly again noted. Mild bilateral interstitial prominence again noted. Small bilateral pleural effusions. No pneumothorax. IMPRESSION: 1. Endotracheal tube, NG tube, right IJ line in stable position. Left IJ line noted with tip good position on today's exam with tip over SVC. 2. Cardiomegaly. Mild bilateral interstitial prominence again noted suggesting interstitial edema. Pneumonitis cannot be excluded. Similar findings noted on prior exam. Small bilateral pleural effusions noted. Electronically Signed   By: Marcello Moores  Register   On: 10/27/2020 05:59   ECHOCARDIOGRAM LIMITED  Result Date: 10/26/2020    ECHOCARDIOGRAM LIMITED REPORT   Patient Name:   Kyle Peck Date of Exam: 10/26/2020 Medical Rec #:  [id number]    Height:       67.0 in Accession #:    [id number]   Weight:       211.4 lb Date of Birth:  17-Aug-1960    BSA:          2.071 m Patient Age:    60 years     BP:           114/82 mmHg Patient Gender: M            HR:           100 bpm. Exam Location:  Inpatient Procedure: Limited Echo Indications:    Bacteremia  History:        Patient has prior history of Echocardiogram examinations, most                 recent 10/21/2020. CHF; Risk Factors:Current Smoker and Alcohol                 and Cocaine abuse.  Sonographer:    Mikki Santee RDCS (AE) Referring Phys: [place]  1. Small filamentous strands noted on the ventricular surface of mitral valve leaflets (frame#21), consider TEE for further evaluation.  2. Left ventricular ejection fraction, by estimation, is 20 to 25%. The left ventricle has severely decreased function. The left ventricle demonstrates global hypokinesis.  3. Right ventricular systolic function is normal. The right ventricular size is normal.  4. The mitral valve is normal in structure. No evidence of mitral valve regurgitation. No evidence of mitral stenosis.   5. The aortic valve is normal in structure. There is  mild calcification of the aortic valve. There is mild thickening of the aortic valve. Aortic valve regurgitation is not visualized. Mild to moderate aortic valve sclerosis/calcification is present, without any evidence of aortic stenosis.  6. The inferior vena cava is normal in size with greater than 50% respiratory variability, suggesting right atrial pressure of 3 mmHg. Comparison(s): ECHO 10/21/20: EF 15%. Mildly improved. FINDINGS  Left Ventricle: Left ventricular ejection fraction, by estimation, is 20 to 25%. The left ventricle has severely decreased function. The left ventricle demonstrates global hypokinesis. The left ventricular internal cavity size was normal in size. There is no left ventricular hypertrophy. Right Ventricle: The right ventricular size is normal. No increase in right ventricular wall thickness. Right ventricular systolic function is normal. Left Atrium: Left atrial size was normal in size. Right Atrium: Right atrial size was normal in size. Pericardium: There is no evidence of pericardial effusion. Mitral Valve: The mitral valve is normal in structure. No evidence of mitral valve stenosis. Tricuspid Valve: The tricuspid valve is normal in structure. Tricuspid valve regurgitation is not demonstrated. No evidence of tricuspid stenosis. Aortic Valve: The aortic valve is normal in structure. There is mild calcification of the aortic valve. There is mild thickening of the aortic valve. Aortic valve regurgitation is not visualized. Mild to moderate aortic valve sclerosis/calcification is present, without any evidence of aortic stenosis. Pulmonic Valve: The pulmonic valve was normal in structure. Pulmonic valve regurgitation is not visualized. No evidence of pulmonic stenosis. Aorta: The aortic root is normal in size and structure. Venous: The inferior vena cava is normal in size with greater than 50% respiratory variability, suggesting right  atrial pressure of 3 mmHg. IAS/Shunts: No atrial level shunt detected by color flow Doppler. Additional Comments: Small filamentous strands noted on the ventricular surface of mitral valve leaflets (frame#21), consider TEE for further evaluation. Candee Furbish MD Electronically signed by Candee Furbish MD Signature Date/Time: 10/26/2020/11:30:55 AM    Final      Medications:     Scheduled Medications: . B-complex with vitamin C  1 tablet Per Tube Daily  . chlorhexidine gluconate (MEDLINE KIT)  15 mL Mouth Rinse BID  . Chlorhexidine Gluconate Cloth  6 each Topical Q0600  . clonazePAM  1 mg Per Tube BID  . docusate  50 mg Per Tube Daily  . enoxaparin (LOVENOX) injection  40 mg Subcutaneous Q24H  . feeding supplement (PROSource TF)  45 mL Per Tube QID  . folic acid  1 mg Per Tube Daily  . hydrocortisone sod succinate (SOLU-CORTEF) inj  100 mg Intravenous Q8H  . mouth rinse  15 mL Mouth Rinse 10 times per day  . mupirocin ointment  1 application Nasal BID  . pantoprazole (PROTONIX) IV  40 mg Intravenous Q24H  . polyethylene glycol  17 g Oral Daily  . QUEtiapine  25 mg Oral BID  . thiamine  100 mg Per Tube Daily   Or  . thiamine  100 mg Intravenous Daily    Infusions: .  prismasol BGK 4/2.5 500 mL/hr at 10/27/20 0512  .  prismasol BGK 4/2.5 400 mL/hr at 10/26/20 2008  . sodium chloride    . dexmedetomidine (PRECEDEX) IV infusion Stopped (10/25/20 0415)  . DOBUTamine 2.5 mcg/kg/min (10/27/20 0800)  . feeding supplement (VITAL 1.5 CAL) 55 mL/hr at 10/27/20 0000  . fentaNYL infusion INTRAVENOUS 110 mcg/hr (10/27/20 0800)  . midazolam 5 mg/hr (10/27/20 0800)  . norepinephrine (LEVOPHED) Adult infusion 3 mcg/min (10/27/20 0800)  . prismasol BGK 4/2.5  1,500 mL/hr at 10/27/20 0807  . propofol (DIPRIVAN) infusion 50 mcg/kg/min (10/27/20 0822)  . sodium phosphate  Dextrose 5% IVPB 43 mL/hr at 10/27/20 0800  . vancomycin Stopped (10/27/20 2633)  . vasopressin Stopped (10/25/20 1442)    PRN  Medications: diphenhydrAMINE, haloperidol lactate, heparin, ondansetron (ZOFRAN) IV, sodium chloride     Assessment/Plan   1. Shock: Suspect mixed cardiogenic and septic with MRSA on blood cultures.  - Currently on NE 3 + dobutamine 2.5 with stable MAP. Wean NE as able.  2. Acute systolic CHF: Echo this admission with EF 15%, moderate LV dilation, mildly decreased RV function, mild MR.  Cause uncertain, could be due to ETOH abuse and cocaine but cannot rule out CAD. Presentation consistent with CHF, not ACS.  HS-TnI in setting of hypotension/shock was 204 => 141. CVP 10 today on CVVH, co-ox 71.5% on NE 3 + dobutamine 2.5.  - Gentle CVVH today, aim for net 50 cc/hr.  Hopefully can stop soon to watch for renal recovery.   - Wean down NE as able today.  - Continue dobutamine 2.5.  - Ideally would eventually get right/left cath, will depend on trajectory of renal function.  3. AKI: Creatinine up to 2.09 with persistent hyperkalemia and minimal UOP.  Patient was transferred from [facility] to start CVVH, renal following closely.  Now on CVVH and still making urine, CVP 10 today.   - Continue gentle UF via CVVH, would like to see net negative 50 cc/hr today. Hopefully can stop soon and look for renal recovery.  4. ETOH abuse: With withdrawal.  5. Cocaine abuse: Apparently this is not chronic.  6. Acute hypoxemic respiratory failure: Initially in setting of ETOH withdrawal and altered mental status.  - Vent per CCM.  7. ID: MRSA in blood, ?PNA source.  PCT 1.5 => 1.04.  TEE today showed no endocarditis.  - Continue vancomycin  CRITICAL CARE Performed by: Loralie Champagne  Total critical care time: 35 minutes  Critical care time was exclusive of separately billable procedures and treating other patients.  Critical care was necessary to treat or prevent imminent or life-threatening deterioration.  Critical care was time spent personally by me on the following activities: development of treatment  plan with patient and/or surrogate as well as nursing, discussions with consultants, evaluation of patient's response to treatment, examination of patient, obtaining history from patient or surrogate, ordering and performing treatments and interventions, ordering and review of laboratory studies, ordering and review of radiographic studies, pulse oximetry and re-evaluation of patient's condition.  Loralie Champagne 10/27/2020 9:04 AM

## 2020-10-27 NOTE — CV Procedure (Signed)
Procedure: TEE  Sedation: Propofol gtt  Indication: MRSA bacteremia.   Findings: Please see echo section for full report.  The left ventricle was mildly dilated with diffuse hypokinesis, EF 20%.  Normal wall thickness.  No LV thrombus.  The right ventricle was mildly dilated with mild hypokinesis.  There was trivial TR, no TV vegetation.  No PV vegetation.  Trileaflet aortic valve without stenosis or regurgitation, no TV vegetation.  No significant mitral regurgitation or stenosis, no MV vegetation.  Mild left atrial enlargement, no LA appendage thrombus.  Normal right atrium.  No ASD or PFO by color doppler.  Normal caliber aorta with mild plaque.   Impression: No evidence for endocarditis.   Marca Ancona 10/27/2020 9:01 AM

## 2020-10-27 NOTE — Plan of Care (Signed)
  Problem: Clinical Measurements: Goal: Respiratory complications will improve Outcome: Progressing   Problem: Clinical Measurements: Goal: Cardiovascular complication will be avoided Outcome: Progressing   Problem: Nutrition: Goal: Adequate nutrition will be maintained Outcome: Progressing   Problem: Elimination: Goal: Will not experience complications related to bowel motility Outcome: Progressing   Problem: Pain Managment: Goal: General experience of comfort will improve Outcome: Progressing   Problem: Safety: Goal: Ability to remain free from injury will improve Outcome: Progressing   

## 2020-10-27 NOTE — Interval H&P Note (Signed)
History and Physical Interval Note:  10/27/2020 8:57 AM  Kyle Peck  has presented today for surgery, with the diagnosis of * No surgery found *.  The various methods of treatment have been discussed with the patient and family. After consideration of risks, benefits and other options for treatment, the patient has consented to  * No surgery found * as a surgical intervention.  The patient's history has been reviewed, patient examined, no change in status, stable for surgery.  I have reviewed the patient's chart and labs.  Questions were answered to the patient's satisfaction.     Nastassia Bazaldua [employer]

## 2020-10-27 NOTE — Progress Notes (Signed)
Paged Dr. Malen Gauze regarding extremely high CRRT filter pressures immediately after restarting with new cartridge.  Trialysis catheter flushes and draws back easily.  We will instill tPA if the filter clots again and continue to restart CRRT overnight as needed, without anticoagulation.  Dr. Malen Gauze will assess for potential to stop CRRT in the morning.

## 2020-10-27 NOTE — TOC Progression Note (Addendum)
Transition of Care (TOC) - Progression Note  Heart Failure   Patient Details  Name: Kyle Peck MRN: [id number] Date of Birth: 16-Mar-1961  Transition of Care [facility]) CM/SW Contact  Grace Haggart, LCSWA Phone Number: 10/27/2020, 10:04 AM  Clinical Narrative:   CSW received a message from the patients nurse to contact the patients wife Mrs. Christiano Blandon [phone] and CSW spoke with Mrs. Lemmons who reported that the water was just turned off and she applied for disability 6 months ago and her and her husband have no income and couldn't afford to pay the $349.46 water bill. CSW will speak with HV team and see what is possible and then will call the patients wife back.    2:15pm: CSW was able to pay patients water bill $349.46 due to [facility] clinic support and their water will be turned back on tomorrow and CSW notified patients wife, Taveon Enyeart [phone].  CSW will initiate substance use consult with patient once he is no longer sedated and on a vent.  CSW will continue to follow throughout discharge.  Expected Discharge Plan: Home/Self Care Barriers to Discharge: Continued Medical Work up  Expected Discharge Plan and Services Expected Discharge Plan: Home/Self Care In-house Referral: Clinical Social Work     Living arrangements for the past 2 months: Single Family Home                                       Social Determinants of Health (SDOH) Interventions    Readmission Risk Interventions No flowsheet data found.  Korrie Hofbauer, MSW, LCSWA [phone] Heart Failure Social Worker

## 2020-10-27 NOTE — Progress Notes (Signed)
[facility] Progress Note  Name: Shadrick Senne MRN: [id number] DOB: 03-09-1961  Subjective:  He had 3.4 L UF over 5/24 with CRRT.  Also with 519 mL uop over 5/24.  Has been at net neg 50 to 100 ml/hr. He has been on levo at 3 mcg/min and dobutamine.  Has TEE today.  Per nursing requiring increased sedation due to agitation.  BF is running at 200.  Filter clotted. Restarted.  Review of systems:  Unable to obtain 2/2 intubated and sedated ----------  Background on consult Oshen Wiberg is a 60 y.o. male with past medical history significant for polysubstance abuse.  History is obtained from the chart.  He presented to medical attention due to leg and abdominal swelling, DOE on 5/19.  Found to have an EF of 15 %- given IV lasix and heart cath was planned.  Over the last 24 hours, pt developed acute ETOH withdrawal, had to be sedated-  Then developed resp failure req intubation.  Has become more hypotensive overnight- req pressors which have been maxed out-  Baseline crt under 1, was 0.9 yesterday -  Now anuric and K was 6.8 this AM (getting LR boluses and drip)  With crt of 2.  CCM is at bedside putting in central line, we changed it to a trialysis cath as unfortunately I feel he needs to transfer to [facility] and get CRRT.  He is not that acidotic yet but has a lactate of 4.5  Intake/Output Summary (Last 24 hours) at 10/27/2020 0549 Last data filed at 10/27/2020 0500 Gross per 24 hour  Intake 2043.04 ml  Output 4033 ml  Net -1989.96 ml    Vitals:  Vitals:   10/27/20 0300 10/27/20 0327 10/27/20 0400 10/27/20 0500  BP: 108/71  101/71   Pulse: 84  80 73  Resp: 18  18 18   Temp: (!) 96.62 F (35.9 C)  (!) 96.44 F (35.8 C) (!) 95.9 F (35.5 C)  TempSrc:      SpO2: 97% 98% 97% 98%  Weight:   90.1 kg   Height:         Physical Exam:  General adult male in bed critically ill   HEENT normocephalic atraumatic  Lungs clear to auscultation bilaterally; FiO2 40, PEEP 5 Heart S1S2 no  rub Abdomen soft nontender distended with obese habitus Extremities no to trace lower extremity edema; trace upper extremity edema Psych no anxiety or agitation but sedated Neuro - sedation currently running Access RIJ nontunneled dialysis catheter   Medications reviewed   Labs:  BMP Latest Ref Rng & Units 10/27/2020 10/26/2020 10/26/2020  Glucose 70 - 99 mg/dL 10/28/2020) 409(W) -  BUN 6 - 20 mg/dL 119(J) 47(W) -  Creatinine 0.61 - 1.24 mg/dL 29(F 6.21 -  Sodium 3.08 - 145 mmol/L 136 135 139  Potassium 3.5 - 5.1 mmol/L 4.7 4.6 4.0  Chloride 98 - 111 mmol/L 104 103 -  CO2 22 - 32 mmol/L 29 27 -  Calcium 8.9 - 10.3 mg/dL 8.0(L) 8.0(L) -     Assessment/Plan:   Assessment/Plan: 60 year old WM with polysubstance abuse including cocaine and ETOH- presented with new found EF of 15%-  Decompensated and developed AKI and hyperkalemia  1.AKI - ATN secondary to hemodynamic instability. Baseline Cr 0.9.  - Continue CRRT  - CRRT UF rate at net neg 50 to 100 an hour - continue same.  Please page nephrology for UF goal changes.  Replete phos.   2. Hyperkalemia-  Due to  AKI and LR.  Treated with CRRT.  If K rises would please transition to nepro for feeds   3. Acute systolic heart failure with cardiogenic shock.  heart failure team consulted  4. Polysubstance abuse - including cocaine and EtOH   5. EtOH Withdrawal - per primary team.    6. Acute hypoxemic resp failure - setting of etoh withdrawal.  Vent per critical care  7. MRSA bacteremia - on vanc. ID consulted; note TEE planned  Estanislado Emms, MD 10/27/2020 6:05 AM

## 2020-10-27 NOTE — Progress Notes (Signed)
In response to rapidly rising filter pressures, blood returned and CRRT filter changed.

## 2020-10-27 NOTE — Progress Notes (Addendum)
NAMETommie Bohlken, MRN:  [id number], DOB:  09-03-1960, LOS: 6 ADMISSION DATE:  10/20/2020, CONSULTATION DATE:  10/27/2020  REFERRING MD:  Gwendolyn Lima, CHIEF COMPLAINT: Hypotension, post intubation  History of Present Illness:  60 year old EtOH and cocaine user admitted 5/18 with leg and abdominal swelling.  Echo showed EF of 15% with global hypokinesis, cardiology consultation advised cardiac cath after adequate medical management.  He was treated for alcohol withdrawal with Ativan and on 5/22 transferred to ICU and placed on Precedex but eventually required intubation .  He subsequently became hypotensive and required increasing Levophed and vasopressin through the night.  On 5/23, he was noted to have AKI & hyperkalemia, PCCM consulted  Pertinent  Medical History  ETOH use Marijuana and cocaine use UDS + cocaine   Significant Hospital Events: Including procedures, antibiotic start and stop dates in addition to other pertinent events    5/18 Admitted to [facility]  5/23 Intubated and HD cath placed for initiation of CRRT, transferred to [facility] for CHF evaluation and continued care   Interim History / Subjective:  No acute issues overnight Continues to display significant agitation with any sedation reduction Tolerating CRRT, making urine  Objective   Blood pressure 111/73, pulse 71, temperature (!) 95.54 F (35.3 C), resp. rate 18, height 5\' 7"  (1.702 m), weight 90.1 kg, SpO2 96 %. CVP:  [8 mmHg-17 mmHg] 14 mmHg  Vent Mode: PRVC FiO2 (%):  [40 %] 40 % Set Rate:  [18 bmp] 18 bmp Vt Set:  [520 mL] 520 mL PEEP:  [5 cmH20] 5 cmH20 Pressure Support:  [10 cmH20] 10 cmH20 Plateau Pressure:  [15 cmH20-23 cmH20] 20 cmH20   Intake/Output Summary (Last 24 hours) at 10/27/2020 10/29/2020 Last data filed at 10/27/2020 0700 Gross per 24 hour  Intake 1915.59 ml  Output 4251 ml  Net -2335.41 ml   Filed Weights   10/25/20 0608 10/26/20 0500 10/27/20 0400  Weight: 89.6 kg 95.9 kg 90.1 kg     Examination: General: Acute on chronic ill-appearing middle-aged male lying in bed on mechanical ventilation in no acute distress HEENT: ETT, MM pink/moist, PERRL,  Neuro: Sedated on ventilator with Versed and fentanyl but will arouse to any stimuli CV: s1s2 regular rate and rhythm, no murmur, rubs, or gallops,  PULM: Clear to auscultation bilaterally, no increased work of breathing, tolerating ventilator well GI: soft, bowel sounds active in all 4 quadrants, non-tender, non-distended, tolerating TF Extremities: warm/dry, no edema  Skin: no rashes or lesions  Labs/imaging that I have personally reviewed    5/19 ECHO with EF 15% with global hypokinesis   5/24 pertinent labs: Creatinine 1.4., Elevated LFTs, procal 1.52, WBC 6.7  Resolved Hospital Problem list     Assessment & Plan:  60yo male with finding of new cardiomyopathy in the setting of EtOH/cocaine use, intubated for severe EtOH withdrawal/agitated delirium 5/23 and now hypotension and AKI with hyperkalemia  Shock  Cardiogenic shock -ECHO with EF of 15%  -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 Patient spouse denies any known IV drug P: Heart failure and cardiology following, appreciate assistance Coox 71.5 Strict intake and output Daily weight Optimize electrolytes Volume removal per CRRT Wean pressor support as able for MAP goal greater than 65 Infectious disease following, appreciate assistance Repeat blood cultures Continue IV vancomycin given MRSA bacteremia, Zosyn stopped 5/24 Scheduled for TEE today 5/25  Acute kidney  injury Hyperkalemia -Cardiorenal versus ATN related to hypotension in the last 12 hours P: Nephrology following, appreciate assistance CRRT started 5/24, continue nephrology Monitor urine output Ensure renal perfusion Optimize electrolytes Trend  potassium  Acute Hypoxic Respiratory Failure  -In the setting acute CHF and severe ETOH withdrawal  P: Continue ventilator support with lung protective strategies  Wean sedation as able to allow for SBT Patient currently requiring high-dose sedation in the setting of substance abuse, sedation weaning may be challenging Wean PEEP and FiO2 for sats greater than 90%. Head of bed elevated 30 degrees. Plateau pressures less than 30 cm H20.  Follow intermittent chest x-ray and ABG.   Ensure adequate pulmonary hygiene  Follow cultures  VAP bundle in place  PAD protocol Antibiotics as above  Acute encephalopathy/agitated delirium -In the setting of ETOH withdrawal and acute critical illness  P: Continue CIWA and pad protocol Maintain neuro protective measures Nutrition and bowel regiment Seizure precautions Aspiration precautions RASS goal 0 to -1 PO Klonopin started 5/24 Add PO Seroquel today 5/25  Polysubstance abuse -UDS positive for cocaine, benzodiazepine, and THC EtOH abuse P: Cessation education when appropriate  Elevated LFTs -Felt secondary to cardiogenic shock with known history of EtOH abuse P: Trend LFTs, slowly improving Avoid hepatotoxins Hepatitis panel pending  At risk malnutrition  P: Continue tube feeds  Best practice   Diet:  Tube Feed  Pain/Anxiety/Delirium protocol (if indicated): Yes (RASS goal -1) VAP protocol (if indicated): Yes DVT prophylaxis: Subcutaneous Heparin GI prophylaxis: PPI Glucose control:  SSI Yes Central venous access:  Yes, and it is still needed Arterial line:  N/A Foley:  Yes, and it is still needed Mobility:  bed rest  PT consulted: N/A Last date of multidisciplinary goals of care discussion Daily family update  Code Status:  full code Disposition: ICU   Critical care time:   Performed by: Delfin Gant  Total critical care time: 37 minutes  Critical care time was exclusive of separately billable procedures and  treating other patients.  Critical care was necessary to treat or prevent imminent or life-threatening deterioration.  Critical care was time spent personally by me on the following activities: development of treatment plan with patient and/or surrogate as well as nursing, discussions with consultants, evaluation of patient's response to treatment, examination of patient, obtaining history from patient or surrogate, ordering and performing treatments and interventions, ordering and review of laboratory studies, ordering and review of radiographic studies, pulse oximetry and re-evaluation of patient's condition.  Delfin Gant, NP-C [facility] Personal contact information can be found on Amion  10/27/2020, 7:05 AM   PCCM Attending:   This is a 60 year old gentleman, history of alcohol abuse, cocaine abuse, admitted with leg and abdominal swelling found to have a reduced ejection fraction, cardiogenic shock, septic shock secondary to MRSA bacteremia.  Urine drug screen was positive for cocaine and marijuana.  Patient developed multiorgan failure requiring intubation, vasopressors and CVVHD due to acute renal failure.  BP 108/76   Pulse 86   Temp (!) 96.44 F (35.8 C) (Esophageal)   Resp 18   Ht 5\' 7"  (1.702 m)   Wt 90.1 kg   SpO2 100%   BMI 31.11 kg/m   General: Elderly debilitated gentleman appears older than stated age intubated on mechanical life support critically ill, Bair hugger on CVVHD HEENT: Endotracheal tube in place Heart: Regular rhythm, S1-S2 Lungs: Bilateral mechanically ventilated breath sounds Abdomen: Soft nontender nondistended Extremities: No significant edema  Labs: Reviewed, sodium 136 Serum creatinine 0.88 AST ALT 301/388 improving, procalcitonin 1, improving Chest x-ray appropriate lines and tubes stability, bilateral interstitial markings concerning for pulmonary edema. The patient's images have been independently reviewed by me.     Assessment: Cardiogenic shock Septic shock MRSA bacteremia Acute renal failure requiring CVVHD Hyperkalemia secondary to above Acute hypoxemic respiratory failure requiring intubation mechanical ventilation secondary to above Polysubstance abuse secondary to cocaine, alcohol and marijuana present on admission Acute metabolic encephalopathy secondary to above Elevated LFTs, transaminitis secondary to above  Plan: Continue to titrate vasopressors to maintain mean blood pressure greater than 65 mmHg VAP prophylaxis Adult mechanical ventilator protocol, wean FiO2 and PEEP as tolerated PAD, high for sedation. Continue CVVHD per nephrology Continue to follow urine output and kidney function Remains on vancomycin for MRSA bacteremia Surface echo with no evidence of vegetation TEE completed today with no evidence of vegetation Appreciate cardiology input. Patient has multiple medical problems and suspect prognosis overall is poor.  Sedation continues to be an issue.  I suspect he has a very high tolerance to some of these medications due to his polysubstance abuse history. At this time remains on Versed infusion plus fentanyl. For the patient's TEE we will add propofol.  Case discussed with Dr. Shirlee Latch from cardiology  This patient is critically ill with multiple organ system failure; which, requires frequent high complexity decision making, assessment, support, evaluation, and titration of therapies. This was completed through the application of advanced monitoring technologies and extensive interpretation of multiple databases. During this encounter critical care time was devoted to patient care services described in this note for 50 minutes.   Josephine Igo, DO [facility] 10/27/2020 9:20 AM

## 2020-10-27 NOTE — Progress Notes (Signed)
  Echocardiogram 2D Echocardiogram has been performed.  Pieter Partridge 10/27/2020, 9:11 AM

## 2020-10-28 ENCOUNTER — Inpatient Hospital Stay (HOSPITAL_COMMUNITY): Payer: Self-pay

## 2020-10-28 LAB — CBC
HCT: 36.6 % — ABNORMAL LOW (ref 39.0–52.0)
Hemoglobin: 10.8 g/dL — ABNORMAL LOW (ref 13.0–17.0)
MCH: 27.1 pg (ref 26.0–34.0)
MCHC: 29.5 g/dL — ABNORMAL LOW (ref 30.0–36.0)
MCV: 92 fL (ref 80.0–100.0)
Platelets: 214 10*3/uL (ref 150–400)
RBC: 3.98 MIL/uL — ABNORMAL LOW (ref 4.22–5.81)
RDW: 16.7 % — ABNORMAL HIGH (ref 11.5–15.5)
WBC: 8.4 10*3/uL (ref 4.0–10.5)
nRBC: 0 % (ref 0.0–0.2)

## 2020-10-28 LAB — GLUCOSE, CAPILLARY
Glucose-Capillary: 148 mg/dL — ABNORMAL HIGH (ref 70–99)
Glucose-Capillary: 137 mg/dL — ABNORMAL HIGH (ref 70–99)
Glucose-Capillary: 173 mg/dL — ABNORMAL HIGH (ref 70–99)
Glucose-Capillary: 157 mg/dL — ABNORMAL HIGH (ref 70–99)
Glucose-Capillary: 136 mg/dL — ABNORMAL HIGH (ref 70–99)

## 2020-10-28 LAB — COOXEMETRY PANEL
Carboxyhemoglobin: 1.3 % (ref 0.5–1.5)
Methemoglobin: 0.8 % (ref 0.0–1.5)
O2 Saturation: 82.1 %
Total hemoglobin: 11.1 g/dL — ABNORMAL LOW (ref 12.0–16.0)

## 2020-10-28 LAB — RENAL FUNCTION PANEL
Albumin: 2.7 g/dL — ABNORMAL LOW (ref 3.5–5.0)
Albumin: 2.8 g/dL — ABNORMAL LOW (ref 3.5–5.0)
Anion gap: 5 (ref 5–15)
Anion gap: 4 — ABNORMAL LOW (ref 5–15)
BUN: 23 mg/dL — ABNORMAL HIGH (ref 6–20)
BUN: 21 mg/dL — ABNORMAL HIGH (ref 6–20)
CO2: 28 mmol/L (ref 22–32)
CO2: 28 mmol/L (ref 22–32)
Calcium: 8.1 mg/dL — ABNORMAL LOW (ref 8.9–10.3)
Calcium: 8.2 mg/dL — ABNORMAL LOW (ref 8.9–10.3)
Chloride: 104 mmol/L (ref 98–111)
Chloride: 105 mmol/L (ref 98–111)
Creatinine, Ser: 0.83 mg/dL (ref 0.61–1.24)
Creatinine, Ser: 0.86 mg/dL (ref 0.61–1.24)
GFR, Estimated: >60 mL/min (ref >60)
GFR, Estimated: >60 mL/min (ref >60)
Glucose, Bld: 148 mg/dL — ABNORMAL HIGH (ref 70–99)
Glucose, Bld: 141 mg/dL — ABNORMAL HIGH (ref 70–99)
Phosphorus: 2.9 mg/dL (ref 2.5–4.6)
Phosphorus: 3 mg/dL (ref 2.5–4.6)
Potassium: 4.9 mmol/L (ref 3.5–5.1)
Potassium: 5.3 mmol/L — ABNORMAL HIGH (ref 3.5–5.1)
Sodium: 137 mmol/L (ref 135–145)
Sodium: 137 mmol/L (ref 135–145)

## 2020-10-28 LAB — CULTURE, BLOOD (ROUTINE X 2)
Special Requests: ADEQUATE
Special Requests: ADEQUATE

## 2020-10-28 LAB — CREATININE, SERUM
Creatinine, Ser: 0.88 mg/dL (ref 0.61–1.24)
GFR, Estimated: >60 mL/min (ref >60)

## 2020-10-28 LAB — MAGNESIUM: Magnesium: 2.3 mg/dL (ref 1.7–2.4)

## 2020-10-28 MED ORDER — FUROSEMIDE 10 MG/ML IJ SOLN
80.0000 mg | Freq: Once | INTRAMUSCULAR | Status: AC
  Administered 2020-10-28: 80 mg via INTRAVENOUS
  Filled 2020-10-28: qty 8

## 2020-10-28 MED ORDER — POLYETHYLENE GLYCOL 3350 17 G PO PACK
17.0000 g | PACK | Freq: Two times a day (BID) | ORAL | Status: DC
  Administered 2020-10-28 – 2020-10-29 (×3): 17 g via ORAL
  Filled 2020-10-28 (×3): qty 1

## 2020-10-28 MED ORDER — SODIUM ZIRCONIUM CYCLOSILICATE 10 G PO PACK
10.0000 g | PACK | Freq: Once | ORAL | Status: AC
  Administered 2020-10-28: 10 g via ORAL
  Filled 2020-10-28: qty 1

## 2020-10-28 MED ORDER — DOCUSATE SODIUM 50 MG/5ML PO LIQD
100.0000 mg | Freq: Two times a day (BID) | ORAL | Status: DC
  Administered 2020-10-28 – 2020-10-30 (×6): 100 mg
  Filled 2020-10-28 (×5): qty 10

## 2020-10-28 MED ORDER — ACETAMINOPHEN 160 MG/5ML PO SOLN
650.0000 mg | ORAL | Status: DC | PRN
  Administered 2020-10-28 – 2020-10-29 (×2): 650 mg via ORAL
  Filled 2020-10-28 (×2): qty 20.3

## 2020-10-28 MED ORDER — ALTEPLASE 2 MG IJ SOLR
4.0000 mg | Freq: Once | INTRAMUSCULAR | Status: AC
  Administered 2020-10-28: 4 mg
  Filled 2020-10-28 (×2): qty 4

## 2020-10-28 NOTE — Progress Notes (Signed)
Patient ID: Kyle Peck, male   DOB: 09/23/1960, 60 y.o.   MRN: [id number]     Advanced Heart Failure Rounding Note  PCP-Cardiologist: None   Subjective:    Transferred to [facility] from [facility] for shock treatment.   Now off NE and on dobutamine 2.5 with co-ox 82%. SBP 90s-100s.      Continues on CVVH, UOP 475.  CVP 11 this morning, UF ongoing at 50-100 cc/hr over the last day.  Weight down.   Sedated on vent. FiO2 40%.  Wakes up agitated per nursing.   MRSA bacteremia, on vancomycin.  TEE 5/25: EF 20%, mild RV dysfunction, no endocarditis.   Objective:   Weight Range: 88.6 kg Body mass index is 30.59 kg/m.   Vital Signs:   Temp:  [95.72 F (35.4 C)-99.32 F (37.4 C)] 97.52 F (36.4 C) (05/26 0600) Pulse Rate:  [69-97] 87 (05/26 0600) Resp:  [13-24] 18 (05/26 0600) BP: (86-121)/(58-91) 99/64 (05/26 0600) SpO2:  [95 %-100 %] 97 % (05/26 0600) FiO2 (%):  [40 %] 40 % (05/26 0345) Weight:  [88.6 kg] 88.6 kg (05/26 0500) Last BM Date:  (PTA)  Weight change: Filed Weights   10/26/20 0500 10/27/20 0400 10/28/20 0500  Weight: 95.9 kg 90.1 kg 88.6 kg    Intake/Output:   Intake/Output Summary (Last 24 hours) at 10/28/2020 0725 Last data filed at 10/28/2020 0700 Gross per 24 hour  Intake 2203.1 ml  Output 3235 ml  Net -1031.9 ml      Physical Exam   CVP 11 General: Sedated on vent Neck: JVP 10 cm, no thyromegaly or thyroid nodule.  Lungs: decreased at bases CV: Nondisplaced PMI.  Heart regular S1/S2, no S3/S4, no murmur.  No peripheral edema.   Abdomen: Soft, nontender, no hepatosplenomegaly, no distention.  Skin: Intact without lesions or rashes.  Neurologic: Sedated on vent Extremities: No clubbing or cyanosis.  HEENT: Normal.    Telemetry   SR 70-80s (personally reviewed).   EKG    N/A  Labs    CBC Recent Labs    10/26/20 0319 10/28/20 0445  WBC 6.7 8.4  HGB 12.1* 10.8*  HCT 38.7* 36.6*  MCV 89.2 92.0  PLT 280 295   Basic Metabolic Panel Recent  Labs    10/27/20 0414 10/27/20 1606 10/28/20 0445  NA 136 135 137  K 4.7 4.6 4.9  CL 104 104 104  CO2 '29 27 28  ' GLUCOSE 162* 152* 148*  BUN 21* 19 23*  CREATININE 0.88 0.76 0.88  0.83  CALCIUM 8.0* 8.1* 8.1*  MG 2.4  --  2.3  PHOS 2.4* 2.7 2.9   Liver Function Tests Recent Labs    10/26/20 0241 10/26/20 1701 10/27/20 0414 10/27/20 1606 10/28/20 0445  AST 339*  --  301*  --   --   ALT 335*  --  388*  --   --   ALKPHOS 54  --  56  --   --   BILITOT 1.7*  --  1.2  --   --   PROT 5.3*  --  5.5*  --   --   ALBUMIN 2.5*   < > 2.7* 2.6* 2.7*   < > = values in this interval not displayed.   No results for input(s): LIPASE, AMYLASE in the last 72 hours. Cardiac Enzymes No results for input(s): CKTOTAL, CKMB, CKMBINDEX, TROPONINI in the last 72 hours.  BNP: BNP (last 3 results) Recent Labs    10/21/20 0033 10/23/20 0504  BNP  2,252.0* 1,685.0*    ProBNP (last 3 results) No results for input(s): PROBNP in the last 8760 hours.   D-Dimer No results for input(s): DDIMER in the last 72 hours. Hemoglobin A1C No results for input(s): HGBA1C in the last 72 hours. Fasting Lipid Panel Recent Labs    10/27/20 0414  CHOL 103  HDL 24*  LDLCALC 65  TRIG 68  CHOLHDL 4.3   Thyroid Function Tests No results for input(s): TSH, T4TOTAL, T3FREE, THYROIDAB in the last 72 hours.  Invalid input(s): FREET3  Other results:   Imaging    No results found.   Medications:     Scheduled Medications: . B-complex with vitamin C  1 tablet Per Tube Daily  . chlorhexidine gluconate (MEDLINE KIT)  15 mL Mouth Rinse BID  . Chlorhexidine Gluconate Cloth  6 each Topical Q0600  . clonazePAM  1 mg Per Tube BID  . docusate  50 mg Per Tube Daily  . enoxaparin (LOVENOX) injection  40 mg Subcutaneous Q24H  . feeding supplement (PROSource TF)  45 mL Per Tube QID  . folic acid  1 mg Per Tube Daily  . hydrocortisone sod succinate (SOLU-CORTEF) inj  50 mg Intravenous Q12H  . mouth  rinse  15 mL Mouth Rinse 10 times per day  . mupirocin ointment  1 application Nasal BID  . pantoprazole (PROTONIX) IV  40 mg Intravenous Q24H  . polyethylene glycol  17 g Oral Daily  . QUEtiapine  50 mg Oral BID  . sodium chloride flush  10-40 mL Intracatheter Q12H  . thiamine  100 mg Per Tube Daily   Or  . thiamine  100 mg Intravenous Daily    Infusions: .  prismasol BGK 4/2.5 500 mL/hr at 10/28/20 0213  .  prismasol BGK 4/2.5 400 mL/hr at 10/27/20 2221  . sodium chloride    . dexmedetomidine (PRECEDEX) IV infusion Stopped (10/25/20 0415)  . DOBUTamine 2.5 mcg/kg/min (10/28/20 0600)  . feeding supplement (VITAL 1.5 CAL) 55 mL/hr at 10/28/20 0600  . fentaNYL infusion INTRAVENOUS 95 mcg/hr (10/28/20 0600)  . midazolam 2 mg/hr (10/28/20 0600)  . norepinephrine (LEVOPHED) Adult infusion Stopped (10/28/20 0125)  . prismasol BGK 4/2.5 1,500 mL/hr at 10/28/20 0717  . vancomycin 200 mL/hr at 10/28/20 0600  . vasopressin Stopped (10/25/20 1442)    PRN Medications: diphenhydrAMINE, haloperidol lactate, heparin, ondansetron (ZOFRAN) IV, sodium chloride, sodium chloride flush     Assessment/Plan   1. Shock: Suspect mixed cardiogenic and septic with MRSA on blood cultures.  - Off NE, now on dobutamine 2.5.   2. Acute systolic CHF: Echo this admission with EF 15%, moderate LV dilation, mildly decreased RV function, mild MR. Cause uncertain, could be due to ETOH abuse and cocaine but cannot rule out CAD. Presentation consistent with CHF, not ACS.  HS-TnI in setting of hypotension/shock was 204 => 141. CVP 11 today on CVVH, co-ox 82% on dobutamine 2.5.  - Gentle CVVH today, aim for net 50 cc/hr.  Stop CVVH when it clots off.   - Continue dobutamine 2.5 for now.  - Ideally would eventually get right/left cath, will depend on trajectory of renal function.  3. AKI: Creatinine up to 2.09 with persistent hyperkalemia and minimal UOP.  Patient was transferred from [facility] to start CVVH, renal  following closely.  Now on CVVH and still making urine, CVP 11 today.   - Continue gentle UF via CVVH, would like to see net negative 50 cc/hr today. Stop CVVH and look  for renal recovery when filter clogs. 4. ETOH abuse: With withdrawal.  5. Cocaine abuse: Apparently this is not chronic.  6. Acute hypoxemic respiratory failure: Initially in setting of ETOH withdrawal and altered mental status.  - Vent wean per CCM.  - pCXR today.  7. ID: MRSA in blood, ?PNA source.  PCT 1.5 => 1.04.  TEE 5/25 showed no endocarditis.  - Continue vancomycin  CRITICAL CARE Performed by: Loralie Champagne  Total critical care time: 35 minutes  Critical care time was exclusive of separately billable procedures and treating other patients.  Critical care was necessary to treat or prevent imminent or life-threatening deterioration.  Critical care was time spent personally by me on the following activities: development of treatment plan with patient and/or surrogate as well as nursing, discussions with consultants, evaluation of patient's response to treatment, examination of patient, obtaining history from patient or surrogate, ordering and performing treatments and interventions, ordering and review of laboratory studies, ordering and review of radiographic studies, pulse oximetry and re-evaluation of patient's condition.  Loralie Champagne 10/28/2020 7:25 AM

## 2020-10-28 NOTE — Progress Notes (Signed)
[facility] Infectious Diseases Follow Up Note  Patient Identification: Patient Name: Kyle Peck MRN: [id number] Admit Date: 10/20/2020 11:52 PM Age: 60 y.o.Today's Date: 10/28/2020   Reason for Visit: MRSA bacteremia  Principal Problem:   Elevated brain natriuretic peptide (BNP) level Active Problems:   CHF (congestive heart failure) (HCC)   Transaminitis   Thrombocytosis   Hypoalbuminemia due to protein-calorie malnutrition (HCC)   Tobacco use   Pressure injury of skin   Acute respiratory failure (HCC)   Cardiogenic shock (HCC)   Hyperkalemia   Acute renal failure (ARF) (HCC)   Acute encephalopathy   Polysubstance abuse (HCC)   ETOH abuse   MRSA bacteremia   Cocaine abuse (HCC)  Antibiotics:Vancomycin 5/18-current   Lines/Tubes: Hemodialysis IJ catheter,left IJ catheter,PIV's   Interval Events: Fever curve is downtrending, no leukocytosis.  She is off Levophed, only on low-dose dobutamine.  Sedated and intubated.  TEE is negative for vegetations   Assessment MRSA bacteremia ?  Possibly nosocomial-TTE and TEE negative for vegetations Acute systolic CHF/volume overload Cardiogenic and septic shock-on low-dose pressors Acute respiratory failure status postintubation AKI on CRRT Transaminitis-in the setting of shock, acute hep panel negative, right upper quadrant ultrasound unremarkable Polysubstance abuse Alcohol withdrawal Sacral ulcer  Recommendations Continue vancomycin, pharmacy to dose Follow-up repeat blood cultures for clearance Will need to assess for metastatic infections when mental status allows to help with duration of treatment Line holiday when feasible given staph bacteremia ? CT Chest given concerns of PNA/pleural effusion in Chest xray  Vent management per CCM Alcohol withdrawal management per primary Following   Rest of the management as per the primary team. Thank  you for the consult. Please page with pertinent questions or concerns.  ______________________________________________________________________ Subjective patient seen and examined at the bedside.  Sedated and intubated.  On dobutamine, fentanyl and Versed  Vitals BP 97/70 (BP Location: Right Arm)   Pulse 77   Temp (!) 96.62 F (35.9 C) (Esophageal)   Resp 18   Ht 5\' 7"  (1.702 m)   Wt 88.6 kg   SpO2 98%   BMI 30.59 kg/m     Physical Exam Constitutional:   Debated and sedated    Comments:   Cardiovascular:     Rate and Rhythm: Normal rate and regular rhythm.     Heart sounds: Tachycardic  Pulmonary:     Effort: And sounds    Comments:   Abdominal:     Palpations: Abdomen is soft.     Tenderness: Nondistended  Musculoskeletal:        General: No swelling  of peripheral joints  Skin:    Comments: No peripheral stigmata of endocarditis  Neurological:     General: Unable to assess  Psychiatric:        Mood and Affect: Unable to assess  Pertinent Microbiology Results for orders placed or performed during the hospital encounter of 10/20/20  SARS CORONAVIRUS 2 (TAT 6-24 HRS) Nasopharyngeal Nasopharyngeal Swab     Status: None   Collection Time: 10/21/20  2:00 AM   Specimen: Nasopharyngeal Swab  Result Value Ref Range Status   SARS Coronavirus 2 NEGATIVE NEGATIVE Final    Comment: (NOTE) SARS-CoV-2 target nucleic acids are NOT DETECTED.  The SARS-CoV-2 RNA is generally detectable in upper and lower respiratory specimens during the acute phase of infection. Negative results do not preclude SARS-CoV-2 infection, do not rule out co-infections with other pathogens, and should not be used as the sole basis for treatment or other patient management  decisions. Negative results must be combined with clinical observations, patient history, and epidemiological information. The expected result is Negative.  Fact Sheet for  Patients: HairSlick.no  Fact Sheet for Healthcare Providers: quierodirigir.com  This test is not yet approved or cleared by the [place] FDA and  has been authorized for detection and/or diagnosis of SARS-CoV-2 by FDA under an Emergency Use Authorization (EUA). This EUA will remain  in effect (meaning this test can be used) for the duration of the COVID-19 declaration under Se ction 564(b)(1) of the Act, 21 U.S.C. section 360bbb-3(b)(1), unless the authorization is terminated or revoked sooner.  Performed at [facility], [address] [place], [place] [address]   MRSA PCR Screening     Status: Abnormal   Collection Time: 10/25/20  6:30 AM   Specimen: Nasal Mucosa; Nasopharyngeal  Result Value Ref Range Status   MRSA by PCR POSITIVE (A) NEGATIVE Final    Comment:        The GeneXpert MRSA Assay (FDA approved for NASAL specimens only), is one component of a comprehensive MRSA colonization surveillance program. It is not intended to diagnose MRSA infection nor to guide or monitor treatment for MRSA infections. RESULT CALLED TO, READ BACK BY AND VERIFIED WITH: FOLEY,B. AT 0845 BY HUFFINES,S ON 10/25/20. Performed at [facility], [address] [place], [place] [address]   Culture, blood (routine x 2)     Status: Abnormal (Preliminary result)   Collection Time: 10/25/20  8:06 AM   Specimen: BLOOD  Result Value Ref Range Status   Specimen Description   Final    BLOOD RIGHT ANTECUBITAL Performed at [facility], [address] [place], [place] [address]    Special Requests   Final    BOTTLES DRAWN AEROBIC AND ANAEROBIC Blood Culture adequate volume Performed at [facility], [address] [place], [place] [address]    Culture  Setup Time   Final    AEROBIC BOTTLE ONLY GRAM POSITIVE COCCI Gram Stain Report Called to,Read Back By and Verified With: W COUNCIL,RN@0200  10/26/20 MKELLY CRITICAL VALUE NOTED.   VALUE IS CONSISTENT WITH PREVIOUSLY REPORTED AND CALLED VALUE. Performed at [facility], [address], [place], [place] [address]    Culture STAPHYLOCOCCUS AUREUS (A)  Final   Report Status PENDING  Incomplete  Culture, blood (routine x 2)     Status: Abnormal (Preliminary result)   Collection Time: 10/25/20  8:06 AM   Specimen: BLOOD RIGHT WRIST  Result Value Ref Range Status   Specimen Description   Final    BLOOD RIGHT WRIST Performed at [facility], [name], [place], [place] [address]    Special Requests   Final    BOTTLES DRAWN AEROBIC AND ANAEROBIC Blood Culture adequate volume Performed at [facility], [address], [place], [place] [address]    Culture  Setup Time   Final    AEROBIC BOTTLE ONLY GRAM POSITIVE COCCI Gram Stain Report Called to,Read Back By and Verified With: W COUNCIL,RN@2204  10/25/20 MKELLY CRITICAL RESULT CALLED TO, READ BACK BY AND VERIFIED WITH: PHARMD GREG ABBOTT 10/26/2020 AT 0440 A.HUGHES ANAEROBIC BOTTLE ALSO Performed at [facility], [address], [place], [place] [address]    Culture (A)  Final    STAPHYLOCOCCUS AUREUS SUSCEPTIBILITIES TO FOLLOW Performed at [facility], [address], [place], [place] [address]    Report Status PENDING  Incomplete  Blood Culture ID Panel (Reflexed)  Status: Abnormal   Collection Time: 10/25/20  8:06 AM  Result Value Ref Range Status   Enterococcus faecalis NOT DETECTED NOT DETECTED Final   Enterococcus Faecium NOT DETECTED NOT DETECTED Final   Listeria monocytogenes NOT DETECTED NOT DETECTED Final   Staphylococcus species DETECTED (A) NOT DETECTED Final    Comment: RBV PHARMD GREG ABBOTT 10/26/2020 AT 0441 A.HUGHES    Staphylococcus aureus (BCID) DETECTED (A) NOT DETECTED Final    Comment: Methicillin (oxacillin)-resistant Staphylococcus aureus (MRSA). MRSA is predictably resistant to beta-lactam antibiotics (except ceftaroline). Preferred therapy is vancomycin  unless clinically contraindicated. Patient requires contact precautions if  hospitalized. RBV PHARMD GREG ABBOTT 10/26/2020 AT 0440 A.HUGHES    Staphylococcus epidermidis NOT DETECTED NOT DETECTED Final   Staphylococcus lugdunensis NOT DETECTED NOT DETECTED Final   Streptococcus species NOT DETECTED NOT DETECTED Final   Streptococcus agalactiae NOT DETECTED NOT DETECTED Final   Streptococcus pneumoniae NOT DETECTED NOT DETECTED Final   Streptococcus pyogenes NOT DETECTED NOT DETECTED Final   A.calcoaceticus-baumannii NOT DETECTED NOT DETECTED Final   Bacteroides fragilis NOT DETECTED NOT DETECTED Final   Enterobacterales NOT DETECTED NOT DETECTED Final   Enterobacter cloacae complex NOT DETECTED NOT DETECTED Final   Escherichia coli NOT DETECTED NOT DETECTED Final   Klebsiella aerogenes NOT DETECTED NOT DETECTED Final   Klebsiella oxytoca NOT DETECTED NOT DETECTED Final   Klebsiella pneumoniae NOT DETECTED NOT DETECTED Final   Proteus species NOT DETECTED NOT DETECTED Final   Salmonella species NOT DETECTED NOT DETECTED Final   Serratia marcescens NOT DETECTED NOT DETECTED Final   Haemophilus influenzae NOT DETECTED NOT DETECTED Final   Neisseria meningitidis NOT DETECTED NOT DETECTED Final   Pseudomonas aeruginosa NOT DETECTED NOT DETECTED Final   Stenotrophomonas maltophilia NOT DETECTED NOT DETECTED Final   Candida albicans NOT DETECTED NOT DETECTED Final   Candida auris NOT DETECTED NOT DETECTED Final   Candida glabrata NOT DETECTED NOT DETECTED Final   Candida krusei NOT DETECTED NOT DETECTED Final   Candida parapsilosis NOT DETECTED NOT DETECTED Final   Candida tropicalis NOT DETECTED NOT DETECTED Final   Cryptococcus neoformans/gattii NOT DETECTED NOT DETECTED Final   Meth resistant mecA/C and MREJ DETECTED (A) NOT DETECTED Final    Comment: PHARMD GREG ABBOTT 10/26/2020 AT 0441 A.HUGHES CRITICAL RESULT CALLED TO, READ BACK BY AND VERIFIED WITH: Performed at [facility], [address] [place], [place] [address]   Culture, blood (routine x 2)     Status: None (Preliminary result)   Collection Time: 10/27/20  6:00 AM   Specimen: BLOOD  Result Value Ref Range Status   Specimen Description BLOOD LEFT HAND  Final   Special Requests   Final    BOTTLES DRAWN AEROBIC AND ANAEROBIC Blood Culture adequate volume   Culture   Final    NO GROWTH 1 DAY Performed at [facility], [address], [place], [place] [address]    Report Status PENDING  Incomplete  Culture, blood (routine x 2)     Status: None (Preliminary result)   Collection Time: 10/27/20  6:05 AM   Specimen: BLOOD  Result Value Ref Range Status   Specimen Description BLOOD RIGHT FOREARM  Final   Special Requests   Final    BOTTLES DRAWN AEROBIC AND ANAEROBIC Blood Culture adequate volume   Culture   Final    NO GROWTH 1 DAY Performed at [facility], [address], [place], [place] [address]    Report  Status PENDING  Incomplete    Pertinent Lab. CBC Latest Ref Rng & Units 10/28/2020 10/26/2020 10/26/2020  WBC 4.0 - 10.5 K/uL 8.4 6.7 -  Hemoglobin 13.0 - 17.0 g/dL 10.8(L) 12.1(L) 12.9(L)  Hematocrit 39.0 - 52.0 % 36.6(L) 38.7(L) 38.0(L)  Platelets 150 - 400 K/uL 214 280 -   CMP Latest Ref Rng & Units 10/28/2020 10/28/2020 10/27/2020  Glucose 70 - 99 mg/dL - 098(J148(H) 191(Y152(H)  BUN 6 - 20 mg/dL - 78(G23(H) 19  Creatinine 0.61 - 1.24 mg/dL 9.560.88 2.130.83 0.860.76  Sodium 135 - 145 mmol/L - 137 135  Potassium 3.5 - 5.1 mmol/L - 4.9 4.6  Chloride 98 - 111 mmol/L - 104 104  CO2 22 - 32 mmol/L - 28 27  Calcium 8.9 - 10.3 mg/dL - 8.1(L) 8.1(L)  Total Protein 6.5 - 8.1 g/dL - - -  Total Bilirubin 0.3 - 1.2 mg/dL - - -  Alkaline Phos 38 - 126 U/L - - -  AST 15 - 41 U/L - - -  ALT 0 - 44 U/L - - -   Pertinent Imaging today Plain films and CT images have been personally visualized and interpreted; radiology reports have been reviewed. Decision making incorporated into the Impression /  Recommendations.  Chest Xray 10/27/20 FINDINGS: Endotracheal tube is 4 cm above the carina. Left jugular central line in the upper SVC region. Evidence for a non tunneled right jugular dialysis catheter with the tip in the upper SVC region. Heart remains enlarged. Incomplete evaluation of the left lung base. There continues to be densities at both lung bases. Upper lungs are clear. Negative for pneumothorax. Atherosclerotic calcifications at the aortic arch. Nasogastric tube extends into the abdomen but the tip is beyond the image.  IMPRESSION: 1. Stable appearance of the lungs with persistent bibasilar lung densities. Findings could represent a combination of consolidation and pleural effusions. 2. Stable cardiomegaly. 3. Stable appearance of the support apparatuses.    I have spent more than 35 minutes for this patient encounter including review of prior medical records, coordination of care  with greater than 50% of time being face to face/counseling and discussing diagnostics/treatment plan with the patient/family.  Electronically signed by:   Odette FractionSabina Jaymin Waln, MD Infectious Disease Physician [facility] Pager: [phone]

## 2020-10-28 NOTE — Progress Notes (Signed)
NAMECurtis Cain, MRN:  [id number], DOB:  10-13-1960, LOS: 7 ADMISSION DATE:  10/20/2020, CONSULTATION DATE:  10/28/2020  REFERRING MD:  Gwendolyn Lima, CHIEF COMPLAINT: Hypotension, post intubation  History of Present Illness:  60 year old EtOH and cocaine user admitted 5/18 with leg and abdominal swelling.  Echo showed EF of 15% with global hypokinesis, cardiology consultation advised cardiac cath after adequate medical management.  He was treated for alcohol withdrawal with Ativan and on 5/22 transferred to ICU and placed on Precedex but eventually required intubation .  He subsequently became hypotensive and required increasing Levophed and vasopressin through the night.  On 5/23, he was noted to have AKI & hyperkalemia, PCCM consulted  Pertinent  Medical History  ETOH use Marijuana and cocaine use UDS + cocaine   Significant Hospital Events: Including procedures, antibiotic start and stop dates in addition to other pertinent events    5/18 Admitted to [facility]  5/23 Intubated and HD cath placed for initiation of CRRT, transferred to [facility] for CHF evaluation and continued care   5/26 vent, pressors, cvvhd, high sedation requirements   Interim History / Subjective:   Critically ill, intubated mechanical life support.  Bair hugger, CVVHD in place remains on pressors.  Objective   Blood pressure 99/64, pulse 87, temperature (!) 97.52 F (36.4 C), resp. rate 18, height 5\' 7"  (1.702 m), weight 88.6 kg, SpO2 97 %. CVP:  [6 mmHg-36 mmHg] 36 mmHg  Vent Mode: PRVC FiO2 (%):  [40 %] 40 % Set Rate:  [18 bmp] 18 bmp Vt Set:  [520 mL] 520 mL PEEP:  [5 cmH20] 5 cmH20 Plateau Pressure:  [16 cmH20-20 cmH20] 16 cmH20   Intake/Output Summary (Last 24 hours) at 10/28/2020 10/30/2020 Last data filed at 10/28/2020 0700 Gross per 24 hour  Intake 2203.1 ml  Output 3235 ml  Net -1031.9 ml   Filed Weights   10/26/20 0500 10/27/20 0400 10/28/20 0500  Weight: 95.9 kg 90.1 kg 88.6 kg     Examination: General: Male, appears older than stated age, chronically ill-appearing, critically ill intubated on mechanical life support HEENT: Endotracheal tube in place Neuro: Sedated on mechanical support, Versed plus fentanyl, withdraws to pain CV: Regular rate rhythm, S1-S2 PULM: Bilateral mechanically ventilated breath sounds GI: Soft, nontender nondistended Extremities: Warm dry no edema Skin: No rash  Labs/imaging that I have personally reviewed    5/19 ECHO with EF 15% with global hypokinesis   5/24 pertinent labs: Creatinine 1.4., Elevated LFTs, procal 1.52, WBC 6.7  Resolved Hospital Problem list     Assessment & Plan:   Shock  Cardiogenic shock -ECHO with EF of 15%  -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 Patient spouse denies any known IV drug P: Coox improved Remains on CVVHD for volume removal Titrate pressors to maintain mean arterial pressure greater than 65 Remains on dobutamine and norepinephrine TEE reassuring with no evidence of vegetation Continue vancomycin Appreciate infectious disease input.  Acute kidney injury, acute renal failure requiring CVVHD Hyperkalemia -Cardiorenal versus ATN related to hypotension in the last 12 hours P: CVVHD per nephrology Supplement electrolytes as needed  Acute Hypoxic Respiratory Failure  -In the setting acute CHF and severe ETOH withdrawal  P:  Adult mechanical vent protocol Wean PEEP and FiO2 as tolerated Chest x-ray as needed.  Acute encephalopathy/agitated delirium -In the setting of acute ETOH withdrawal and acute critical illness  P: Continue CIWA protocol Sedation with PAD guidelines Patient currently on Versed infusion Would like to transition from Versed to Precedex. Wean sedation to include fentanyl as tolerated Goal RASS -1  Polysubstance abuse -UDS  positive for cocaine, benzodiazepine, and THC EtOH abuse P: Cessation education once extubated  Elevated LFTs -Felt secondary to cardiogenic shock with known history of EtOH abuse P: Supportive care  At risk malnutrition  P: Tube feeds  Best practice   Diet:  Tube Feed  Pain/Anxiety/Delirium protocol (if indicated): Yes (RASS goal -1) VAP protocol (if indicated): Yes DVT prophylaxis: Subcutaneous Heparin GI prophylaxis: PPI Glucose control:  SSI Yes Central venous access:  Yes, and it is still needed Arterial line:  N/A Foley:  Yes, and it is still needed Mobility:  bed rest  PT consulted: N/A Last date of multidisciplinary goals of care discussion Daily family update  Code Status:  full code Disposition: ICU   This patient is critically ill with multiple organ system failure; which, requires frequent high complexity decision making, assessment, support, evaluation, and titration of therapies. This was completed through the application of advanced monitoring technologies and extensive interpretation of multiple databases. During this encounter critical care time was devoted to patient care services described in this note for 34 minutes.  Josephine Igo, DO [facility] 10/28/2020 7:23 AM

## 2020-10-28 NOTE — Progress Notes (Addendum)
CRRT filter clotted. CRRT stopped at 1445. Arterial and venous access ports Heparin locked with 1400 units of heparin each.

## 2020-10-28 NOTE — Progress Notes (Signed)
[facility] Progress Note  Name: Taim Wurm MRN: [id number] DOB: 20-Jul-1960  Subjective:  Seen on CRRT; procedure supervised.  He had 2.2 L UF over 5/25 with CRRT.  Also with 355 mL uop over 5/25.  Has been at net neg 50 ml/hr.  He had a TEE yesterday and per nursing verbal report valve looked ok.  He has been off of levo.  On dobutamine, fentanyl, and versed.  Issues with clotting.   Review of systems:  Unable to obtain 2/2 intubated and sedated ----------  Background on consult Lamel Mahrt is a 60 y.o. male with past medical history significant for polysubstance abuse.  History is obtained from the chart.  He presented to medical attention due to leg and abdominal swelling, DOE on 5/19.  Found to have an EF of 15 %- given IV lasix and heart cath was planned.  Over the last 24 hours, pt developed acute ETOH withdrawal, had to be sedated-  Then developed resp failure req intubation.  Has become more hypotensive overnight- req pressors which have been maxed out-  Baseline crt under 1, was 0.9 yesterday -  Now anuric and K was 6.8 this AM (getting LR boluses and drip)  With crt of 2.  CCM is at bedside putting in central line, we changed it to a trialysis cath as unfortunately I feel he needs to transfer to [facility] and get CRRT.  He is not that acidotic yet but has a lactate of 4.5  Intake/Output Summary (Last 24 hours) at 10/28/2020 0536 Last data filed at 10/28/2020 0200 Gross per 24 hour  Intake 2034.34 ml  Output 3070 ml  Net -1035.66 ml    Vitals:  Vitals:   10/28/20 0000 10/28/20 0100 10/28/20 0200 10/28/20 0345  BP: 99/67 102/66 100/68   Pulse: 85 82 86 93  Resp: 18 18 18    Temp: (!) 97.34 F (36.3 C) (!) 96.98 F (36.1 C) (!) 96.98 F (36.1 C)   TempSrc:      SpO2: 98% 100% 99%   Weight:      Height:         Physical Exam:  General adult male in bed critically ill   HEENT normocephalic atraumatic  Lungs clear to auscultation bilaterally; FiO2 40, PEEP  5 Heart S1S2 no rub Abdomen soft nontender distended with obese habitus Extremities trace lower extremity edema; trace upper extremity edema Psych no anxiety or agitation but sedated Neuro - sedation currently running Access RIJ nontunneled dialysis catheter   Medications reviewed   Labs:  BMP Latest Ref Rng & Units 10/28/2020 10/27/2020 10/27/2020  Glucose 70 - 99 mg/dL 10/29/2020) 794(I) 016(P)  BUN 6 - 20 mg/dL 537(S) 19 82(L)  Creatinine 0.61 - 1.24 mg/dL 07(E 6.75 4.49  Sodium 135 - 145 mmol/L 137 135 136  Potassium 3.5 - 5.1 mmol/L 4.9 4.6 4.7  Chloride 98 - 111 mmol/L 104 104 104  CO2 22 - 32 mmol/L 28 27 29   Calcium 8.9 - 10.3 mg/dL 8.1(L) 8.1(L) 8.0(L)     Assessment/Plan:   Assessment/Plan: 60 year old WM with polysubstance abuse including cocaine and ETOH- presented with new found EF of 15%-  Decompensated and developed AKI and hyperkalemia  1.AKI - ATN secondary to hemodynamic instability. Baseline Cr 0.9.  - Continue CRRT  - CRRT UF rate at net neg 50 to 100 an hour - continue same for now.  Please page nephrology for UF goal changes.   - When filter clots then will  transition to a trial off of CRRT - order placed and discussed with nursing   2. Hyperkalemia-  Due to AKI and LR.  Treated with CRRT.  Please transition to nepro for feeds    3. Acute systolic heart failure with cardiogenic shock.  heart failure team consulted  4. Polysubstance abuse - including cocaine and EtOH   5. EtOH Withdrawal - per primary team.    6. Acute hypoxemic resp failure - setting of etoh withdrawal.  Vent per critical care  7. MRSA bacteremia - on vanc. ID consulted; note TEE on 5/24 - await final read  8. Anemia normocytic - no acute need for PRBC's  Estanislado Emms, MD 10/28/2020 5:49 AM

## 2020-10-29 LAB — MAGNESIUM: Magnesium: 2 mg/dL (ref 1.7–2.4)

## 2020-10-29 LAB — RENAL FUNCTION PANEL
Albumin: 2.8 g/dL — ABNORMAL LOW (ref 3.5–5.0)
Anion gap: 5 (ref 5–15)
BUN: 29 mg/dL — ABNORMAL HIGH (ref 6–20)
CO2: 31 mmol/L (ref 22–32)
Calcium: 8.2 mg/dL — ABNORMAL LOW (ref 8.9–10.3)
Chloride: 102 mmol/L (ref 98–111)
Creatinine, Ser: 0.93 mg/dL (ref 0.61–1.24)
GFR, Estimated: >60 mL/min (ref >60)
Glucose, Bld: 138 mg/dL — ABNORMAL HIGH (ref 70–99)
Phosphorus: 4.3 mg/dL (ref 2.5–4.6)
Potassium: 4.2 mmol/L (ref 3.5–5.1)
Sodium: 138 mmol/L (ref 135–145)

## 2020-10-29 LAB — COOXEMETRY PANEL
Carboxyhemoglobin: 1.2 % (ref 0.5–1.5)
Methemoglobin: 0.9 % (ref 0.0–1.5)
O2 Saturation: 68 %
Total hemoglobin: 12.2 g/dL (ref 12.0–16.0)

## 2020-10-29 LAB — GLUCOSE, CAPILLARY
Glucose-Capillary: 111 mg/dL — ABNORMAL HIGH (ref 70–99)
Glucose-Capillary: 114 mg/dL — ABNORMAL HIGH (ref 70–99)
Glucose-Capillary: 115 mg/dL — ABNORMAL HIGH (ref 70–99)
Glucose-Capillary: 127 mg/dL — ABNORMAL HIGH (ref 70–99)
Glucose-Capillary: 129 mg/dL — ABNORMAL HIGH (ref 70–99)
Glucose-Capillary: 99 mg/dL (ref 70–99)

## 2020-10-29 LAB — VANCOMYCIN, RANDOM: Vancomycin Rm: 8

## 2020-10-29 MED ORDER — CHLORDIAZEPOXIDE HCL 10 MG PO CAPS
10.0000 mg | ORAL_CAPSULE | Freq: Three times a day (TID) | ORAL | Status: DC
  Administered 2020-10-29 – 2020-10-31 (×7): 10 mg
  Filled 2020-10-29 (×7): qty 2

## 2020-10-29 MED ORDER — QUETIAPINE FUMARATE 100 MG PO TABS
100.0000 mg | ORAL_TABLET | Freq: Two times a day (BID) | ORAL | Status: DC
  Administered 2020-10-29: 100 mg via ORAL
  Filled 2020-10-29: qty 1

## 2020-10-29 MED ORDER — VANCOMYCIN HCL 750 MG/150ML IV SOLN
750.0000 mg | Freq: Two times a day (BID) | INTRAVENOUS | Status: DC
  Administered 2020-10-29 – 2020-10-30 (×3): 750 mg via INTRAVENOUS
  Filled 2020-10-29 (×4): qty 150

## 2020-10-29 MED ORDER — SORBITOL 70 % SOLN
30.0000 mL | Freq: Once | Status: AC
  Administered 2020-10-29: 30 mL
  Filled 2020-10-29: qty 30

## 2020-10-29 MED ORDER — FUROSEMIDE 10 MG/ML IJ SOLN
80.0000 mg | Freq: Once | INTRAMUSCULAR | Status: AC
  Administered 2020-10-29: 80 mg via INTRAVENOUS
  Filled 2020-10-29: qty 8

## 2020-10-29 MED ORDER — QUETIAPINE FUMARATE 100 MG PO TABS
100.0000 mg | ORAL_TABLET | Freq: Two times a day (BID) | ORAL | Status: DC
  Administered 2020-10-29 – 2020-10-31 (×4): 100 mg
  Filled 2020-10-29 (×4): qty 1

## 2020-10-29 MED ORDER — CHLORHEXIDINE GLUCONATE CLOTH 2 % EX PADS
6.0000 | MEDICATED_PAD | Freq: Every day | CUTANEOUS | Status: DC
  Administered 2020-10-29 – 2020-10-30 (×3): 6 via TOPICAL

## 2020-10-29 MED ORDER — POLYETHYLENE GLYCOL 3350 17 G PO PACK
17.0000 g | PACK | Freq: Two times a day (BID) | ORAL | Status: DC
  Administered 2020-10-30 (×2): 17 g
  Filled 2020-10-29 (×2): qty 1

## 2020-10-29 MED ORDER — SODIUM CHLORIDE 0.9% FLUSH
3.0000 mL | Freq: Two times a day (BID) | INTRAVENOUS | Status: DC
  Administered 2020-11-02 – 2020-11-03 (×2): 3 mL via INTRAVENOUS

## 2020-10-29 MED ORDER — VANCOMYCIN VARIABLE DOSE PER UNSTABLE RENAL FUNCTION (PHARMACIST DOSING): Status: DC

## 2020-10-29 MED ORDER — CHLORDIAZEPOXIDE HCL 10 MG PO CAPS: 10.0000 mg | ORAL_CAPSULE | Freq: Three times a day (TID) | ORAL | Status: DC

## 2020-10-29 MED ORDER — ACETAMINOPHEN 160 MG/5ML PO SOLN
650.0000 mg | ORAL | Status: DC | PRN
  Administered 2020-10-29 – 2020-10-30 (×2): 650 mg
  Filled 2020-10-29 (×2): qty 20.3

## 2020-10-29 NOTE — Progress Notes (Signed)
Pharmacy Antibiotic Note  Kyle Peck is a 60 y.o. male admitted on 10/20/2020 with bacteremia.  Pharmacy has been consulted for Vanco dosing.  ID: Tmax 102.2 currently, Lactate 1.6, PCT 1.01, WBC WNL, Scr 0.93 with good UOP. (CVVHD off this AM) - TTE and TEE were negative for endocarditis Vanc 2g x1, vanc 1000mg  IV q24h while on CRRT until 5/27 AM   Vanco 5/23 >> Zosyn 5/23 >>5/24 Hydrocort 100 q8h 5/23> (5/26)  5/27 PM : VR 8  5/25 Bcx ng 1 day 5/23 Bcx: 2/4 MRSA  MRSA PCR: positive Hepatitis/HIV panel neg 5/25 TEE neg for vegetations   Plan: Vancomycin 750 mg IV Q 12 hrs. Goal AUC 400-550. Expected AUC: 480 SCr used: 0.93 (weight 89.3kg) Adjust dose as needed for weight and changing renal function. Peak/trough at steady state for AUC.     Height: 5\' 7"  (170.2 cm) Weight: 89.3 kg (196 lb 13.9 oz) IBW/kg (Calculated) : 66.1  Temp (24hrs), Avg:100.5 F (38.1 C), Min:99.14 F (37.3 C), Max:102.2 F (39 C)  Recent Labs  Lab 10/25/20 0537 10/25/20 0654 10/25/20 0947 10/25/20 1753 10/26/20 0241 10/26/20 0319 10/26/20 1701 10/27/20 0414 10/27/20 1606 10/28/20 0445 10/28/20 1709 10/29/20 0442 10/29/20 1855  WBC 7.2  --   --   --   --  6.7  --   --   --  8.4  --   --   --   CREATININE 1.82* 2.07* 1.83*   < > 1.40*  --    < > 0.88 0.76 0.88  0.83 0.86 0.93  --   LATICACIDVEN  --  4.5* 3.8*  --  1.6  --   --   --   --   --   --   --   --   VANCORANDOM  --   --   --   --   --  10  --   --   --   --   --   --  8   < > = values in this interval not displayed.    Estimated Creatinine Clearance: 90.1 mL/min (by C-G formula based on SCr of 0.93 mg/dL).    No Known Allergies   Kyle Peck S. 10/31/20, PharmD, BCPS Clinical Staff Pharmacist [email or web address] 10/31/20 10/29/2020 8:15 PM

## 2020-10-29 NOTE — Progress Notes (Signed)
[facility] Infectious Diseases Follow Up Note  Patient Identification: Patient Name: Kyle Peck MRN: [id number] Admit Date: 10/20/2020 11:52 PM Age: 60 y.o.Today's Date: 10/29/2020   Reason for Visit: MRSA bacteremia  Principal Problem:   Elevated brain natriuretic peptide (BNP) level Active Problems:   CHF (congestive heart failure) (HCC)   Transaminitis   Thrombocytosis   Hypoalbuminemia due to protein-calorie malnutrition (HCC)   Tobacco use   Pressure injury of skin   Acute respiratory failure (HCC)   Cardiogenic Peck (HCC)   Hyperkalemia   Acute renal failure (ARF) (HCC)   Acute encephalopathy   Polysubstance abuse (HCC)   ETOH abuse   MRSA bacteremia   Cocaine abuse (HCC)  Antibiotics:Vancomycin 5/18-current   Lines/Tubes:Hemodialysis IJ catheter,left IJ catheter,PIV's   Interval Events: Low-grade fevers, no leukocytosis, blood culture 5/25 no growth in 1 day   Assessment MRSA bacteremia?  Possible nosocomial: TTE and TEE negative for vegetations. No known hardware. No swelling in peripheral joints.   Acute systolic CHF Combined Peck-cardiogenic and septic Alcohol withdrawal - on precedex  AKI - CRRT initially, cr is wnl  Acute respiratory failure status post intubation - CCM weaning sedation and planning for extubation  Transaminitis - stable  Polysubstance abuse Sacral ulcer  Recommendations Continue Vancomycin, pharmacy to dose Fu repeat blood cultures on 5/25 for clearance, no growth in 2 days so far Central line holiday when feasible given staph bacteremia Metastatic work up for staph bacteremia when mental status allows.  Anticipate at least 4 weeks of IV cefazolin given complicated MSSA bacteremia  Vent Manangement/aclohol withdrawal management per CCM. Per nursing, weaning on sedation and plans for extubation  Monitor CBC, BMP and Vancomycin trough  Contact precautions    Dr Drue Second will check on him over the weekend. Otherwise I will follow up on Tuesday  Rest of the management as per the primary team. Thank you for the consult. Please page with pertinent questions or concerns.  ______________________________________________________________________ Subjective patient seen and examined at the bedside. On precdex, low dose dobutamine, fentanyl and versed   Vitals BP 119/77   Pulse 87   Temp 99.68 F (37.6 C)   Resp 18   Ht 5\' 7"  (1.702 m)   Wt 89.3 kg   SpO2 99%   BMI 30.83 kg/m     Physical Exam Constitutional:  Intubated and sedated     Comments:   Cardiovascular:     Rate and Rhythm: Normal rate and regular rhythm.     Heart sounds: soft systolic murmur+  Pulmonary:     Effort: loud vent sounds     Comments:   Abdominal:     Palpations: Abdomen is soft.     Tenderness: Non tender and non distended   Musculoskeletal:        General: No swelling or tenderness.   Skin:    Comments: No lesions or rashes, no perpheral stigmata for endocarditis   Neurological:     General: unable to assess  Psychiatric:        Mood and Affect: unable to assess   Pertinent Microbiology Results for orders placed or performed during the hospital encounter of 10/20/20  SARS CORONAVIRUS 2 (TAT 6-24 HRS) Nasopharyngeal Nasopharyngeal Swab     Status: None   Collection Time: 10/21/20  2:00 AM   Specimen: Nasopharyngeal Swab  Result Value Ref Range Status   SARS Coronavirus 2 NEGATIVE NEGATIVE Final    Comment: (NOTE) SARS-CoV-2 target nucleic acids are NOT DETECTED.  The SARS-CoV-2  RNA is generally detectable in upper and lower respiratory specimens during the acute phase of infection. Negative results do not preclude SARS-CoV-2 infection, do not rule out co-infections with other pathogens, and should not be used as the sole basis for treatment or other patient management decisions. Negative results must be combined with clinical  observations, patient history, and epidemiological information. The expected result is Negative.  Fact Sheet for Patients: HairSlick.no  Fact Sheet for Healthcare Providers: quierodirigir.com  This test is not yet approved or cleared by the [place] FDA and  has been authorized for detection and/or diagnosis of SARS-CoV-2 by FDA under an Emergency Use Authorization (EUA). This EUA will remain  in effect (meaning this test can be used) for the duration of the COVID-19 declaration under Se ction 564(b)(1) of the Act, 21 U.S.C. section 360bbb-3(b)(1), unless the authorization is terminated or revoked sooner.  Performed at [facility], [address] [place], [place] [address]   MRSA PCR Screening     Status: Abnormal   Collection Time: 10/25/20  6:30 AM   Specimen: Nasal Mucosa; Nasopharyngeal  Result Value Ref Range Status   MRSA by PCR POSITIVE (A) NEGATIVE Final    Comment:        The GeneXpert MRSA Assay (FDA approved for NASAL specimens only), is one component of a comprehensive MRSA colonization surveillance program. It is not intended to diagnose MRSA infection nor to guide or monitor treatment for MRSA infections. RESULT CALLED TO, READ BACK BY AND VERIFIED WITH: FOLEY,B. AT 0845 BY HUFFINES,S ON 10/25/20. Performed at [facility], [address], [place], [place] [address]   Culture, blood (routine x 2)     Status: Abnormal   Collection Time: 10/25/20  8:06 AM   Specimen: BLOOD  Result Value Ref Range Status   Specimen Description   Final    BLOOD RIGHT ANTECUBITAL Performed at [facility], [address] [place], [place] [address]    Special Requests   Final    BOTTLES DRAWN AEROBIC AND ANAEROBIC Blood Culture adequate volume Performed at [facility], [address] [place], [place] [address]    Culture  Setup Time   Final    AEROBIC BOTTLE ONLY GRAM POSITIVE COCCI Gram Stain Report  Called to,Read Back By and Verified With: W COUNCIL,RN@0200  10/26/20 MKELLY CRITICAL VALUE NOTED.  VALUE IS CONSISTENT WITH PREVIOUSLY REPORTED AND CALLED VALUE.    Culture (A)  Final    STAPHYLOCOCCUS AUREUS SUSCEPTIBILITIES PERFORMED ON PREVIOUS CULTURE WITHIN THE LAST 5 DAYS. Performed at [facility], [address], [place], [place] [address]    Report Status 10/28/2020 FINAL  Final  Culture, blood (routine x 2)     Status: Abnormal   Collection Time: 10/25/20  8:06 AM   Specimen: BLOOD RIGHT WRIST  Result Value Ref Range Status   Specimen Description   Final    BLOOD RIGHT WRIST Performed at [facility], [address] [place], [place] [address]    Special Requests   Final    BOTTLES DRAWN AEROBIC AND ANAEROBIC Blood Culture adequate volume Performed at [facility], [address], [place], [place] [address]    Culture  Setup Time   Final    AEROBIC BOTTLE ONLY GRAM POSITIVE COCCI Gram Stain Report Called to,Read Back By and Verified With: W COUNCIL,RN@2204  10/25/20 MKELLY CRITICAL RESULT CALLED TO, READ BACK BY AND VERIFIED WITH: PHARMD GREG ABBOTT 10/26/2020 AT 0440 A.HUGHES ANAEROBIC BOTTLE  ALSO Performed at [facility], [address], [place], [place] [address]    Culture METHICILLIN RESISTANT STAPHYLOCOCCUS AUREUS (A)  Final   Report Status 10/28/2020 FINAL  Final   Organism ID, Bacteria METHICILLIN RESISTANT STAPHYLOCOCCUS AUREUS  Final      Susceptibility   Methicillin resistant staphylococcus aureus - MIC*    CIPROFLOXACIN <=0.5 SENSITIVE Sensitive     ERYTHROMYCIN >=8 RESISTANT Resistant     GENTAMICIN <=0.5 SENSITIVE Sensitive     OXACILLIN >=4 RESISTANT Resistant     TETRACYCLINE >=16 RESISTANT Resistant     VANCOMYCIN <=0.5 SENSITIVE Sensitive     TRIMETH/SULFA <=10 SENSITIVE Sensitive     CLINDAMYCIN <=0.25 SENSITIVE Sensitive     RIFAMPIN <=0.5 SENSITIVE Sensitive     Inducible Clindamycin NEGATIVE Sensitive     * METHICILLIN  RESISTANT STAPHYLOCOCCUS AUREUS  Blood Culture ID Panel (Reflexed)     Status: Abnormal   Collection Time: 10/25/20  8:06 AM  Result Value Ref Range Status   Enterococcus faecalis NOT DETECTED NOT DETECTED Final   Enterococcus Faecium NOT DETECTED NOT DETECTED Final   Listeria monocytogenes NOT DETECTED NOT DETECTED Final   Staphylococcus species DETECTED (A) NOT DETECTED Final    Comment: RBV PHARMD GREG ABBOTT 10/26/2020 AT 0441 A.HUGHES    Staphylococcus aureus (BCID) DETECTED (A) NOT DETECTED Final    Comment: Methicillin (oxacillin)-resistant Staphylococcus aureus (MRSA). MRSA is predictably resistant to beta-lactam antibiotics (except ceftaroline). Preferred therapy is vancomycin unless clinically contraindicated. Patient requires contact precautions if  hospitalized. RBV PHARMD GREG ABBOTT 10/26/2020 AT 0440 A.HUGHES    Staphylococcus epidermidis NOT DETECTED NOT DETECTED Final   Staphylococcus lugdunensis NOT DETECTED NOT DETECTED Final   Streptococcus species NOT DETECTED NOT DETECTED Final   Streptococcus agalactiae NOT DETECTED NOT DETECTED Final   Streptococcus pneumoniae NOT DETECTED NOT DETECTED Final   Streptococcus pyogenes NOT DETECTED NOT DETECTED Final   A.calcoaceticus-baumannii NOT DETECTED NOT DETECTED Final   Bacteroides fragilis NOT DETECTED NOT DETECTED Final   Enterobacterales NOT DETECTED NOT DETECTED Final   Enterobacter cloacae complex NOT DETECTED NOT DETECTED Final   Escherichia coli NOT DETECTED NOT DETECTED Final   Klebsiella aerogenes NOT DETECTED NOT DETECTED Final   Klebsiella oxytoca NOT DETECTED NOT DETECTED Final   Klebsiella pneumoniae NOT DETECTED NOT DETECTED Final   Proteus species NOT DETECTED NOT DETECTED Final   Salmonella species NOT DETECTED NOT DETECTED Final   Serratia marcescens NOT DETECTED NOT DETECTED Final   Haemophilus influenzae NOT DETECTED NOT DETECTED Final   Neisseria meningitidis NOT DETECTED NOT DETECTED Final    Pseudomonas aeruginosa NOT DETECTED NOT DETECTED Final   Stenotrophomonas maltophilia NOT DETECTED NOT DETECTED Final   Candida albicans NOT DETECTED NOT DETECTED Final   Candida auris NOT DETECTED NOT DETECTED Final   Candida glabrata NOT DETECTED NOT DETECTED Final   Candida krusei NOT DETECTED NOT DETECTED Final   Candida parapsilosis NOT DETECTED NOT DETECTED Final   Candida tropicalis NOT DETECTED NOT DETECTED Final   Cryptococcus neoformans/gattii NOT DETECTED NOT DETECTED Final   Meth resistant mecA/C and MREJ DETECTED (A) NOT DETECTED Final    Comment: PHARMD GREG ABBOTT 10/26/2020 AT 0441 A.HUGHES CRITICAL RESULT CALLED TO, READ BACK BY AND VERIFIED WITH: Performed at [facility], [address], [place], [place] [address]   Culture, blood (routine x 2)     Status: None (Preliminary result)   Collection Time: 10/27/20  6:00 AM   Specimen: BLOOD  Result Value Ref  Range Status   Specimen Description BLOOD LEFT HAND  Final   Special Requests   Final    BOTTLES DRAWN AEROBIC AND ANAEROBIC Blood Culture adequate volume   Culture   Final    NO GROWTH 1 DAY Performed at [facility], [address] [place], [place] [address]    Report Status PENDING  Incomplete  Culture, blood (routine x 2)     Status: None (Preliminary result)   Collection Time: 10/27/20  6:05 AM   Specimen: BLOOD  Result Value Ref Range Status   Specimen Description BLOOD RIGHT FOREARM  Final   Special Requests   Final    BOTTLES DRAWN AEROBIC AND ANAEROBIC Blood Culture adequate volume   Culture   Final    NO GROWTH 1 DAY Performed at [facility], [address] [place], [place] [address]    Report Status PENDING  Incomplete    Pertinent Lab. CBC Latest Ref Rng & Units 10/28/2020 10/26/2020 10/26/2020  WBC 4.0 - 10.5 K/uL 8.4 6.7 -  Hemoglobin 13.0 - 17.0 g/dL 10.8(L) 12.1(L) 12.9(L)  Hematocrit 39.0 - 52.0 % 36.6(L) 38.7(L) 38.0(L)  Platelets 150 - 400 K/uL 214 280 -   CMP  Latest Ref Rng & Units 10/29/2020 10/28/2020 10/28/2020  Glucose 70 - 99 mg/dL 098(J138(H) 191(Y141(H) -  BUN 6 - 20 mg/dL 78(G29(H) 95(A21(H) -  Creatinine 0.61 - 1.24 mg/dL 2.130.93 0.860.86 5.780.88  Sodium 135 - 145 mmol/L 138 137 -  Potassium 3.5 - 5.1 mmol/L 4.2 5.3(H) -  Chloride 98 - 111 mmol/L 102 105 -  CO2 22 - 32 mmol/L 31 28 -  Calcium 8.9 - 10.3 mg/dL 8.2(L) 8.2(L) -  Total Protein 6.5 - 8.1 g/dL - - -  Total Bilirubin 0.3 - 1.2 mg/dL - - -  Alkaline Phos 38 - 126 U/L - - -  AST 15 - 41 U/L - - -  ALT 0 - 44 U/L - - -    Pertinent Imaging today Plain films and CT images have been personally visualized and interpreted; radiology reports have been reviewed. Decision making incorporated into the Impression / Recommendations.  I have spent more than 35 minutes for this patient encounter including review of prior medical records, coordination of care  with greater than 50% of time being face to face/counseling and discussing diagnostics/treatment plan with the patient/family.  Electronically signed by:   Odette FractionSabina Siri Buege, MD Infectious Disease Physician [facility]  [facility] Pager: [phone]

## 2020-10-29 NOTE — Progress Notes (Addendum)
Patient ID: Kyle Peck, male   DOB: [birth year], 60 y.o.   MRN: [id number]     Advanced Heart Failure Rounding Note  PCP-Cardiologist: None   Subjective:    Transferred to [facility] from [facility] for shock treatment.   Now off NE. Remains on dobutamine 2.5. Co-ox lower 82%>>68%. SBP 90s-100s.      Now off CRRT, clotted on 5/26 at 1445 per charting. Good response to IV Lasix yesterday, -3L in UOP.  SCr 0.93. CVP 7-8.  Sedated on vent. FiO2 40%.    MRSA bacteremia, on vancomycin.  TEE 5/25: EF 20%, mild RV dysfunction, no endocarditis. Febrile last PM, mTemp 100.9.  Several runs of NSVT on tele overnight, currently NSR K 4.2 Mg 2.0   Objective:   Weight Range: 89.3 kg Body mass index is 30.83 kg/m.   Vital Signs:   Temp:  [96.62 F (35.9 C)-100.94 F (38.3 C)] 100.04 F (37.8 C) (05/27 0500) Pulse Rate:  [74-94] 83 (05/27 0500) Resp:  [17-31] 18 (05/27 0500) BP: (94-117)/(63-84) 111/80 (05/27 0400) SpO2:  [96 %-100 %] 98 % (05/27 0500) FiO2 (%):  [40 %] 40 % (05/27 0400) Weight:  [89.3 kg] 89.3 kg (05/27 0423) Last BM Date:  (PTA)  Weight change: Filed Weights   10/27/20 0400 10/28/20 0500 10/29/20 0423  Weight: 90.1 kg 88.6 kg 89.3 kg    Intake/Output:   Intake/Output Summary (Last 24 hours) at 10/29/2020 0700 Last data filed at 10/29/2020 0631 Gross per 24 hour  Intake 1939.43 ml  Output 4139 ml  Net -2199.57 ml      Physical Exam   CVP 7-8  General: Intubated, Sedated on vent Neck: JVP 8 cm, no thyromegaly or thyroid nodule.  Lungs: intubated w/ decreased BS at bases CV: Nondisplaced PMI.  Heart regular S1/S2, no S3/S4, no murmur.  No peripheral edema.   Abdomen: Soft, nontender, no hepatosplenomegaly, no distention.  Skin: Intact without lesions or rashes.  Neurologic: Intubated and sedated on vent Extremities: No clubbing or cyanosis. + bilateral SCDs HEENT: Normal. + ETT    Telemetry   SR 80s NSVT overnight (personally reviewed).    EKG     N/A  Labs    CBC Recent Labs    10/28/20 0445  WBC 8.4  HGB 10.8*  HCT 36.6*  MCV 92.0  PLT 841   Basic Metabolic Panel Recent Labs    10/28/20 0445 10/28/20 1709 10/29/20 0442  NA 137 137 138  K 4.9 5.3* 4.2  CL 104 105 102  CO2 _0 GLUCOSE 148* 141* 138*  BUN 23* 21* 29*  CREATININE 0.88  0.83 0.86 0.93  CALCIUM 8.1* 8.2* 8.2*  MG 2.3  --  2.0  PHOS 2.9 3.0 4.3   Liver Function Tests Recent Labs    10/27/20 0414 10/27/20 1606 10/28/20 1709 10/29/20 0442  AST 301*  --   --   --   ALT 388*  --   --   --   ALKPHOS 56  --   --   --   BILITOT 1.2  --   --   --   PROT 5.5*  --   --   --   ALBUMIN 2.7*   < > 2.8* 2.8*   < > = values in this interval not displayed.   No results for input(s): LIPASE, AMYLASE in the last 72 hours. Cardiac Enzymes No results for input(s): CKTOTAL, CKMB, CKMBINDEX, TROPONINI in the last 72 hours.  BNP: BNP (  last 3 results) Recent Labs    10/21/20 0033 10/23/20 0504  BNP 2,252.0* 1,685.0*    ProBNP (last 3 results) No results for input(s): PROBNP in the last 8760 hours.   D-Dimer No results for input(s): DDIMER in the last 72 hours. Hemoglobin A1C No results for input(s): HGBA1C in the last 72 hours. Fasting Lipid Panel Recent Labs    10/27/20 0414  CHOL 103  HDL 24*  LDLCALC 65  TRIG 68  CHOLHDL 4.3   Thyroid Function Tests No results for input(s): TSH, T4TOTAL, T3FREE, THYROIDAB in the last 72 hours.  Invalid input(s): FREET3  Other results:   Imaging    DG CHEST PORT 1 VIEW  Result Date: 10/28/2020 CLINICAL DATA:  Congestive heart failure. EXAM: PORTABLE CHEST 1 VIEW COMPARISON:  10/27/2020 FINDINGS: Endotracheal tube is 4 cm above the carina. Left jugular central line in the upper SVC region. Evidence for a non tunneled right jugular dialysis catheter with the tip in the upper SVC region. Heart remains enlarged. Incomplete evaluation of the left lung base. There continues to be densities at  both lung bases. Upper lungs are clear. Negative for pneumothorax. Atherosclerotic calcifications at the aortic arch. Nasogastric tube extends into the abdomen but the tip is beyond the image. IMPRESSION: 1. Stable appearance of the lungs with persistent bibasilar lung densities. Findings could represent a combination of consolidation and pleural effusions. 2. Stable cardiomegaly. 3. Stable appearance of the support apparatuses. Electronically Signed   By: Markus Daft M.D.   On: 10/28/2020 08:20     Medications:     Scheduled Medications: . B-complex with vitamin C  1 tablet Per Tube Daily  . chlorhexidine gluconate (MEDLINE KIT)  15 mL Mouth Rinse BID  . clonazePAM  1 mg Per Tube BID  . docusate  100 mg Per Tube BID  . enoxaparin (LOVENOX) injection  40 mg Subcutaneous Q24H  . feeding supplement (PROSource TF)  45 mL Per Tube QID  . folic acid  1 mg Per Tube Daily  . mouth rinse  15 mL Mouth Rinse 10 times per day  . mupirocin ointment  1 application Nasal BID  . pantoprazole (PROTONIX) IV  40 mg Intravenous Q24H  . polyethylene glycol  17 g Oral BID  . QUEtiapine  50 mg Oral BID  . sodium chloride flush  10-40 mL Intracatheter Q12H  . thiamine  100 mg Per Tube Daily   Or  . thiamine  100 mg Intravenous Daily    Infusions: . sodium chloride    . dexmedetomidine (PRECEDEX) IV infusion 0.6 mcg/kg/hr (10/29/20 0347)  . DOBUTamine 2.5 mcg/kg/min (10/29/20 0400)  . feeding supplement (VITAL 1.5 CAL) 1,000 mL (10/29/20 0210)  . fentaNYL infusion INTRAVENOUS 150 mcg/hr (10/29/20 0400)  . midazolam 2 mg/hr (10/29/20 0509)  . norepinephrine (LEVOPHED) Adult infusion Stopped (10/28/20 0125)  . vancomycin 1,000 mg (10/29/20 0645)  . vasopressin Stopped (10/25/20 1442)    PRN Medications: acetaminophen (TYLENOL) oral liquid 160 mg/5 mL, diphenhydrAMINE, haloperidol lactate, ondansetron (ZOFRAN) IV, sodium chloride flush     Assessment/Plan   1. Shock: Suspect mixed cardiogenic and  septic with MRSA on blood cultures.  - Off NE, now on dobutamine 2.5.  Co-ox 68%  2. Acute systolic CHF: Echo this admission with EF 15%, moderate LV dilation, mildly decreased RV function, mild MR. Cause uncertain, could be due to ETOH abuse and cocaine but cannot rule out CAD. Presentation consistent with CHF, not ACS.  HS-TnI in setting of  hypotension/shock was 204 => 141. CVP 7-8  Today. Now off CVVH (stopped 5/26). Responding well to IV Lasix. Co-ox 68% on dobutamine 2.5.  - Continue dobutamine 2.5 for now.  - Received 80 IV Lasix this am, per nephrology  - Plan right/left cath, likely on Tuesday, as long as renal fx remains stable.  3. AKI: Creatinine up to 2.09 with persistent hyperkalemia and minimal UOP.  Patient was transferred from [facility] to start CVVH, renal following closely.  Now off CVVH and still making urine, CVP 7-8 today.   - Continue IV Lasix 80 mg x 1 today, follow BMP. - Appreciate nephrology's assistance  4. ETOH abuse: With withdrawal.  5. Cocaine abuse: Apparently this is not chronic.  6. Acute hypoxemic respiratory failure: Initially in setting of ETOH withdrawal and altered mental status.  - Vent wean per CCM.  - CXR yesterday, stable/unchanged from prior.  7. ID: MRSA in blood, ?PNA source.  PCT 1.5 => 1.04.  TEE 5/25 showed no endocarditis.  - Continue vancomycin  Lyda Jester, PA-C  10/29/2020 7:00 AM  Patient seen with PA, agree with the above note.   Patient gets extremely agitated with sedation wean, now back on Fentanyl/Versed/Precedex.  Still intubated due to difficulty with sedation wean.    CXR yesterday with bibasilar infiltrates, has MRSA bacteremia on vancomycin.  Tm 100.9.   Co-ox 68% on dobutamine 2.5, creatinine 0.93.  He is now off CVVH, diuresing well with IV Lasix, got Lasix 80 mg IV x 1 today. CVP 7-8.   General: Currently sedated on vent.  Neck: No JVD, no thyromegaly or thyroid nodule.  Lungs: Decreased at bases.  CV:  Nondisplaced PMI.  Heart regular S1/S2, no S3/S4, no murmur.  No peripheral edema.   Abdomen: Soft, nontender, no hepatosplenomegaly, no distention.  Skin: Intact without lesions or rashes.  Neurologic: Sedated on vent.  Extremities: No clubbing or cyanosis.  HEENT: Normal.   Continue vancomycin for MRSA PNA.  TEE negative for endocarditis.    Continue attempts at vent weaning, suspect ETOH withdrawal is making this more difficult, gets very agitated.  Was just on Precedex, now back on Versed.   Off CVVH, diuresing well on Lasix boluses, got 80 mg IV this morning.  CVP 7-8.  Would continue dobutamine 2.5 for now as we watch to see how well renal function recovers. Can hopefully wean soon. Co-ox 68%.   Will eventually need L/RHC as long as renal function remains stable.  Will put on board for Tuesday morning but can adjust this if needed.   CRITICAL CARE Performed by: Loralie Champagne  Total critical care time: 35 minutes  Critical care time was exclusive of separately billable procedures and treating other patients.  Critical care was necessary to treat or prevent imminent or life-threatening deterioration.  Critical care was time spent personally by me on the following activities: development of treatment plan with patient and/or surrogate as well as nursing, discussions with consultants, evaluation of patient's response to treatment, examination of patient, obtaining history from patient or surrogate, ordering and performing treatments and interventions, ordering and review of laboratory studies, ordering and review of radiographic studies, pulse oximetry and re-evaluation of patient's condition.  Loralie Champagne 10/29/2020 7:43 AM

## 2020-10-29 NOTE — Progress Notes (Signed)
[facility] Progress Note  Name: Kyle Peck MRN: [id number] DOB: [birth year]  Subjective:  CRRT clotted on 5/26 at 1445 per charting.  Patient had 1.1 liters UF over 5/26 with CRRT before stopping.  He had 2.9 liters UOP over 5/26.  (gave one dose of lasix 80 mg and lokelma).  He is intubated and unable to provide additional hx  Review of systems:  Unable to obtain 2/2 intubated and sedated ----------  Background on consult Kyle Peck is a 60 y.o. male with past medical history significant for polysubstance abuse.  History is obtained from the chart.  He presented to medical attention due to leg and abdominal swelling, DOE on 5/19.  Found to have an EF of 15 %- given IV lasix and heart cath was planned.  Over the last 24 hours, pt developed acute ETOH withdrawal, had to be sedated-  Then developed resp failure req intubation.  Has become more hypotensive overnight- req pressors which have been maxed out-  Baseline crt under 1, was 0.9 yesterday -  Now anuric and K was 6.8 this AM (getting LR boluses and drip)  With crt of 2.  CCM is at bedside putting in central line, we changed it to a trialysis cath as unfortunately I feel he needs to transfer to [facility] and get CRRT.  He is not that acidotic yet but has a lactate of 4.5  Intake/Output Summary (Last 24 hours) at 10/29/2020 0532 Last data filed at 10/29/2020 0422 Gross per 24 hour  Intake 2376.21 ml  Output 4478 ml  Net -2101.79 ml    Vitals:  Vitals:   10/29/20 0320 10/29/20 0400 10/29/20 0423 10/29/20 0500  BP: 108/79 111/80    Pulse: 83 83  83  Resp: 18 18  18   Temp: 99.3 F (37.4 C) 100.22 F (37.9 C)  100.04 F (37.8 C)  TempSrc: Esophageal Esophageal    SpO2: 98% 97%  98%  Weight:   89.3 kg   Height:         Physical Exam:  General adult male in bed critically ill  HEENT normocephalic atraumatic  Lungs clear to auscultation bilaterally Heart S1S2 no rub; inc JVD Abdomen soft nontender distended with  obese habitus Extremities trace lower extremity edema; trace to 1+ upper extremity edema Psych no anxiety or agitation but sedated Neuro - sedation currently running Access RIJ nontunneled dialysis catheter   Medications reviewed   Labs:  BMP Latest Ref Rng & Units 10/29/2020 10/28/2020 10/28/2020  Glucose 70 - 99 mg/dL 10/30/2020) 606(T) -  BUN 6 - 20 mg/dL 016(W) 10(X) -  Creatinine 0.61 - 1.24 mg/dL 32(T 5.57 3.22  Sodium 135 - 145 mmol/L 138 137 -  Potassium 3.5 - 5.1 mmol/L 4.2 5.3(H) -  Chloride 98 - 111 mmol/L 102 105 -  CO2 22 - 32 mmol/L 31 28 -  Calcium 8.9 - 10.3 mg/dL 8.2(L) 8.2(L) -     Assessment/Plan:   Assessment/Plan: 60 year old WM with polysubstance abuse including cocaine and ETOH- presented with new found EF of 15%-  Decompensated and developed AKI and hyperkalemia  1.AKI - ATN secondary to hemodynamic instability. Baseline Cr 0.9.  - monitor off of CRRT.  nonoliguric  - lasix 80 mg IV once now - would retain the nontunneled dialysis catheter though anticipate removal on 5/28 if this does not appear needed any longer  2. Hyperkalemia-  Due to AKI and LR.  Treated with CRRT.  Please transition to nepro for feeds  if K rises (gave lokelma and lasix on 5/26)   3. Acute systolic heart failure with cardiogenic shock.  heart failure team consulted  4. Polysubstance abuse - including cocaine and EtOH   5. EtOH Withdrawal - per primary team.    6. Acute hypoxemic resp failure - setting of etoh withdrawal.  Vent per critical care  7. MRSA bacteremia - on vanc. ID consulted; note TEE on 5/24 - await final read  8. Anemia normocytic - no acute need for PRBC's  Estanislado Emms, MD 10/29/2020 5:43 AM

## 2020-10-29 NOTE — Progress Notes (Signed)
NAMELyndle Pang, MRN:  [id number], DOB:  [birth year], LOS: 8 ADMISSION DATE:  10/20/2020, CONSULTATION DATE:  10/29/2020  REFERRING MD:  Gwendolyn Lima, CHIEF COMPLAINT: Hypotension, post intubation  History of Present Illness:  60 year old EtOH and cocaine user admitted 5/18 with leg and abdominal swelling.  Echo showed EF of 15% with global hypokinesis, cardiology consultation advised cardiac cath after adequate medical management.  He was treated for alcohol withdrawal with Ativan and on 5/22 transferred to ICU and placed on Precedex but eventually required intubation .  He subsequently became hypotensive and required increasing Levophed and vasopressin through the night.  On 5/23, he was noted to have AKI & hyperkalemia, PCCM consulted  Pertinent  Medical History  ETOH use Marijuana and cocaine use UDS + cocaine   Significant Hospital Events: Including procedures, antibiotic start and stop dates in addition to other pertinent events    5/18 Admitted to [facility]  5/23 Intubated and HD cath placed for initiation of CRRT, transferred to [facility] for CHF evaluation and continued care   5/26 vent, pressors, cvvhd, high sedation requirements   5/27 CVVHD stopped  Interim History / Subjective:   Patient remains critically ill intubated on mechanical life support.  CVVHD stopped to good urine output with Lasix  Objective   Blood pressure 119/77, pulse 87, temperature 99.68 F (37.6 C), resp. rate 18, height 5\' 7"  (1.702 m), weight 89.3 kg, SpO2 99 %. CVP:  [5 mmHg-19 mmHg] 17 mmHg  Vent Mode: PRVC FiO2 (%):  [40 %] 40 % Set Rate:  [18 bmp] 18 bmp Vt Set:  [520 mL] 520 mL PEEP:  [5 cmH20] 5 cmH20 Plateau Pressure:  [16 cmH20-18 cmH20] 18 cmH20   Intake/Output Summary (Last 24 hours) at 10/29/2020 10/31/2020 Last data filed at 10/29/2020 0700 Gross per 24 hour  Intake 1804.76 ml  Output 3920 ml  Net -2115.24 ml   Filed Weights   10/27/20 0400 10/28/20 0500 10/29/20 0423  Weight: 90.1 kg  88.6 kg 89.3 kg    Examination: General: Elderly, chronically ill-appearing gentleman, intubated on mechanical life support critically ill HEENT: Endotracheal tube in place Neuro: Sedated on mechanical support, receiving fentanyl, Versed, Precedex CV: Regular rate rhythm, S1-S2 PULM: Bilateral mechanically ventilated breath sounds GI: Soft, nontender nondistended Extremities: Warm dry no significant edema Skin: No rash  Labs/imaging that I have personally reviewed    5/19 ECHO with EF 15% with global hypokinesis   5/24 pertinent labs: Creatinine 1.4., Elevated LFTs, procal 1.52, WBC 6.7  5/27 sodium 138, creatinine 0.93  Resolved Hospital Problem list     Assessment & Plan:   Shock  Cardiogenic shock -ECHO with EF of 15%  -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 Patient spouse denies any known IV drug P: Choloxin remained stable CVVHD stopped Remains on low-dose dobutamine TEE reassuring with no evidence of vegetation at Diuresing well with Lasix Possible right and left heart cath on Tuesday of next week  Acute kidney injury, acute renal failure requiring CVVHD Hyperkalemia -Cardiorenal versus ATN related to hypotension in the last 12 hours P: CVVHD stopped per nephrology Continue diuresis with Lasix Follow urine output and kidney function  Acute Hypoxic Respiratory Failure  -In the setting acute CHF and severe ETOH withdrawal  P:  Adult mechanical vent protocol Wean PEEP and FiO2 as tolerated Chest x-ray as needed Mental status and agitation prevents liberation  at this time  Acute encephalopathy/agitated delirium -In the setting of acute ETOH withdrawal and acute critical illness  P: CIWA protocol PAD guidelines sedation Try to come off of Versed infusion today Goal RASS -1 Add long-acting benzos down tube.  Polysubstance  abuse -UDS positive for cocaine, benzodiazepine, and THC EtOH abuse P: Cessation counseling once extubated  Elevated LFTs -Felt secondary to cardiogenic shock with known history of EtOH abuse P: Supportive care  At risk malnutrition  P: Tube feeds  Best practice   Diet:  Tube Feed  Pain/Anxiety/Delirium protocol (if indicated): Yes (RASS goal -1) VAP protocol (if indicated): Yes DVT prophylaxis: Subcutaneous Heparin GI prophylaxis: PPI Glucose control:  SSI Yes Central venous access:  Yes, and it is still needed Arterial line:  N/A Foley:  Yes, and it is still needed Mobility:  bed rest  PT consulted: N/A Last date of multidisciplinary goals of care discussion Daily family update  Code Status:  full code Disposition: ICU    This patient is critically ill with multiple organ system failure; which, requires frequent high complexity decision making, assessment, support, evaluation, and titration of therapies. This was completed through the application of advanced monitoring technologies and extensive interpretation of multiple databases. During this encounter critical care time was devoted to patient care services described in this note for 42 minutes.  Josephine Igo, DO [facility] 10/29/2020 8:11 AM

## 2020-10-30 ENCOUNTER — Inpatient Hospital Stay (HOSPITAL_COMMUNITY): Payer: Self-pay

## 2020-10-30 DIAGNOSIS — F1910 Other psychoactive substance abuse, uncomplicated: Secondary | ICD-10-CM

## 2020-10-30 DIAGNOSIS — E875 Hyperkalemia: Secondary | ICD-10-CM

## 2020-10-30 DIAGNOSIS — F1410 Cocaine abuse, uncomplicated: Secondary | ICD-10-CM

## 2020-10-30 DIAGNOSIS — R579 Shock, unspecified: Secondary | ICD-10-CM

## 2020-10-30 LAB — RENAL FUNCTION PANEL
Albumin: 2.5 g/dL — ABNORMAL LOW (ref 3.5–5.0)
Anion gap: 9 (ref 5–15)
BUN: 27 mg/dL — ABNORMAL HIGH (ref 6–20)
CO2: 33 mmol/L — ABNORMAL HIGH (ref 22–32)
Calcium: 8.1 mg/dL — ABNORMAL LOW (ref 8.9–10.3)
Chloride: 97 mmol/L — ABNORMAL LOW (ref 98–111)
Creatinine, Ser: 0.73 mg/dL (ref 0.61–1.24)
GFR, Estimated: >60 mL/min (ref >60)
Glucose, Bld: 133 mg/dL — ABNORMAL HIGH (ref 70–99)
Phosphorus: 3.8 mg/dL (ref 2.5–4.6)
Potassium: 3.5 mmol/L (ref 3.5–5.1)
Sodium: 139 mmol/L (ref 135–145)

## 2020-10-30 LAB — COOXEMETRY PANEL
Carboxyhemoglobin: 1.1 % (ref 0.5–1.5)
Methemoglobin: 0.6 % (ref 0.0–1.5)
O2 Saturation: 59.1 %
Total hemoglobin: 13.1 g/dL (ref 12.0–16.0)

## 2020-10-30 LAB — GLUCOSE, CAPILLARY
Glucose-Capillary: 121 mg/dL — ABNORMAL HIGH (ref 70–99)
Glucose-Capillary: 122 mg/dL — ABNORMAL HIGH (ref 70–99)
Glucose-Capillary: 124 mg/dL — ABNORMAL HIGH (ref 70–99)
Glucose-Capillary: 152 mg/dL — ABNORMAL HIGH (ref 70–99)
Glucose-Capillary: 152 mg/dL — ABNORMAL HIGH (ref 70–99)
Glucose-Capillary: 179 mg/dL — ABNORMAL HIGH (ref 70–99)

## 2020-10-30 LAB — MAGNESIUM: Magnesium: 1.8 mg/dL (ref 1.7–2.4)

## 2020-10-30 MED ORDER — MIDAZOLAM HCL 2 MG/2ML IJ SOLN
0.5000 mg | INTRAMUSCULAR | Status: DC | PRN
  Administered 2020-10-30 (×2): 1 mg via INTRAVENOUS
  Administered 2020-10-30: 2 mg via INTRAVENOUS
  Administered 2020-10-30: 1 mg via INTRAVENOUS
  Administered 2020-10-31: 2 mg via INTRAVENOUS
  Filled 2020-10-30 (×2): qty 2

## 2020-10-30 MED ORDER — POTASSIUM CHLORIDE 20 MEQ PO PACK: 40.0000 meq | PACK | Freq: Once | ORAL | Status: DC

## 2020-10-30 MED ORDER — DOXAZOSIN MESYLATE 2 MG PO TABS
2.0000 mg | ORAL_TABLET | Freq: Every day | ORAL | Status: DC
  Administered 2020-10-30 – 2020-10-31 (×2): 2 mg
  Filled 2020-10-30 (×2): qty 1

## 2020-10-30 MED ORDER — MAGNESIUM SULFATE 2 GM/50ML IV SOLN
2.0000 g | Freq: Once | INTRAVENOUS | Status: AC
  Administered 2020-10-30: 2 g via INTRAVENOUS
  Filled 2020-10-30: qty 50

## 2020-10-30 MED ORDER — ETOMIDATE 2 MG/ML IV SOLN
INTRAVENOUS | Status: AC
  Administered 2020-10-30: 20 mg via INTRAVENOUS
  Filled 2020-10-30: qty 20

## 2020-10-30 MED ORDER — MIDAZOLAM HCL 2 MG/2ML IJ SOLN
INTRAMUSCULAR | Status: AC
  Administered 2020-10-30: 2 mg via INTRAVENOUS
  Filled 2020-10-30: qty 2

## 2020-10-30 MED ORDER — SPIRONOLACTONE 12.5 MG HALF TABLET
12.5000 mg | ORAL_TABLET | Freq: Every day | ORAL | Status: DC
  Administered 2020-10-30 – 2020-11-04 (×5): 12.5 mg via ORAL
  Filled 2020-10-30 (×5): qty 1

## 2020-10-30 MED ORDER — FENTANYL CITRATE (PF) 100 MCG/2ML IJ SOLN
100.0000 ug | Freq: Once | INTRAMUSCULAR | Status: AC
Start: 2020-10-30 — End: 2020-10-30

## 2020-10-30 MED ORDER — FUROSEMIDE 10 MG/ML IJ SOLN
60.0000 mg | Freq: Once | INTRAMUSCULAR | Status: AC
  Administered 2020-10-30: 60 mg via INTRAVENOUS
  Filled 2020-10-30: qty 6

## 2020-10-30 MED ORDER — ROCURONIUM BROMIDE 50 MG/5ML IV SOLN
50.0000 mg | Freq: Once | INTRAVENOUS | Status: AC
  Filled 2020-10-30: qty 5

## 2020-10-30 MED ORDER — POTASSIUM CHLORIDE 20 MEQ PO PACK
40.0000 meq | PACK | Freq: Once | ORAL | Status: AC
  Administered 2020-10-30: 40 meq via ORAL
  Filled 2020-10-30: qty 2

## 2020-10-30 MED ORDER — ROCURONIUM BROMIDE 10 MG/ML (PF) SYRINGE
PREFILLED_SYRINGE | INTRAVENOUS | Status: AC
  Administered 2020-10-30: 50 mg via INTRAVENOUS
  Filled 2020-10-30: qty 10

## 2020-10-30 MED ORDER — MIDAZOLAM HCL 2 MG/2ML IJ SOLN: 2.0000 mg | Freq: Once | INTRAMUSCULAR | Status: AC

## 2020-10-30 MED ORDER — CHLORHEXIDINE GLUCONATE 0.12 % MT SOLN
OROMUCOSAL | Status: AC
  Administered 2020-10-30: 15 mL via OROMUCOSAL
  Filled 2020-10-30: qty 15

## 2020-10-30 MED ORDER — FENTANYL BOLUS VIA INFUSION
50.0000 ug | INTRAVENOUS | Status: DC | PRN
  Administered 2020-10-30 – 2020-10-31 (×3): 100 ug via INTRAVENOUS
  Filled 2020-10-30: qty 100

## 2020-10-30 MED ORDER — POTASSIUM CHLORIDE 20 MEQ PO PACK
40.0000 meq | PACK | Freq: Once | ORAL | Status: AC
  Administered 2020-10-30: 40 meq
  Filled 2020-10-30: qty 2

## 2020-10-30 MED ORDER — FENTANYL CITRATE (PF) 100 MCG/2ML IJ SOLN
INTRAMUSCULAR | Status: AC
  Administered 2020-10-30: 100 ug via INTRAVENOUS
  Filled 2020-10-30: qty 2

## 2020-10-30 MED ORDER — NOREPINEPHRINE 16 MG/250ML-% IV SOLN
0.0000 ug/min | INTRAVENOUS | Status: DC
  Administered 2020-10-30: 1 ug/min via INTRAVENOUS
  Filled 2020-10-30: qty 250

## 2020-10-30 MED ORDER — ETOMIDATE 2 MG/ML IV SOLN: 20.0000 mg | Freq: Once | INTRAVENOUS | Status: AC

## 2020-10-30 NOTE — Progress Notes (Signed)
NAMEDorsey Peck, MRN:  [id number], DOB:  01/08/1961, LOS: 9 ADMISSION DATE:  10/20/2020, CONSULTATION DATE:  10/30/2020  REFERRING MD:  Gwendolyn Lima, CHIEF COMPLAINT: Hypotension, post intubation  History of Present Illness:  60 year old EtOH and cocaine user admitted 5/18 with leg and abdominal swelling.  Echo showed EF of 15% with global hypokinesis, cardiology consultation advised cardiac cath after adequate medical management.  He was treated for alcohol withdrawal with Ativan and on 5/22 transferred to ICU and placed on Precedex but eventually required intubation .  He subsequently became hypotensive and required increasing Levophed and vasopressin through the night.  On 5/23, he was noted to have AKI & hyperkalemia, PCCM consulted  Pertinent  Medical History  ETOH use Marijuana and cocaine use UDS + cocaine   Significant Hospital Events: Including procedures, antibiotic start and stop dates in addition to other pertinent events    5/18 Admitted to [facility]  5/23 Intubated and HD cath placed for initiation of CRRT, transferred to [facility] for CHF evaluation and continued care   5/26 vent, pressors, cvvhd, high sedation requirements   5/27 CVVHD stopped, Foley pulled  5/28 urinary retention, Foley replaced  Interim History / Subjective:   Critically ill intubated on mechanical life support good urine output.  Afebrile.  Urine retention overnight needed replacement of Foley this a.m.  Objective   Blood pressure 92/69, pulse 75, temperature 99.5 F (37.5 C), resp. rate 18, height 5\' 7"  (1.702 m), weight 89.2 kg, SpO2 100 %. CVP:  [3 mmHg-13 mmHg] 8 mmHg  Vent Mode: PRVC FiO2 (%):  [40 %] 40 % Set Rate:  [18 bmp] 18 bmp Vt Set:  [520 mL] 520 mL PEEP:  [5 cmH20] 5 cmH20 Plateau Pressure:  [15 cmH20-17 cmH20] 15 cmH20   Intake/Output Summary (Last 24 hours) at 10/30/2020 11/01/2020 Last data filed at 10/30/2020 0600 Gross per 24 hour  Intake 2112.05 ml  Output 4540 ml  Net -2427.95  ml   Filed Weights   10/28/20 0500 10/29/20 0423 10/30/20 0149  Weight: 88.6 kg 89.3 kg 89.2 kg    Examination: General: Elderly gentleman appears older than stated age critically ill intubated on mechanical life support HEENT: Endotracheal tube in place Neuro: Sedated on mechanical support, fentanyl Versed Precedex CV: Regular rhythm, S1-S2 PULM: Bilateral mechanically ventilated breath sounds GI: Soft, nontender nondistended Extremities: No significant edema, improved Skin: No rash  Labs/imaging that I have personally reviewed    5/19 ECHO with EF 15% with global hypokinesis   5/24 pertinent labs: Creatinine 1.4., Elevated LFTs, procal 1.52, WBC 6.7  5/27 sodium 138, creatinine 0.93  Sodium 139 Chloride 97 BUN 27 Serum creatinine 0.73 Magnesium 1.8  Resolved Hospital Problem list     Assessment & Plan:   Shock  Cardiogenic shock -ECHO with EF of 15%  -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 Patient spouse denies any known IV drug P: CVVHD stopped Remains on low-dose dobutamine TEE with no vegetation Continue diuresis with Lasix Possible right and left heart cath on Tuesday of next week per cardiology.  Acute kidney injury, acute renal failure requiring CVVHD, CVVHD stopped Hyperkalemia, resolved -Cardiorenal versus ATN related to hypotension in the last 12 hours P: Continue diuresis with Lasix Follow urine output and kidney function  Acute Hypoxic Respiratory Failure  -In the setting acute CHF and severe ETOH withdrawal  P:  Adult mechanical  vent protocol Wean PEEP and FiO2 as tolerated none chest x-ray as needed Mental status and agitation on mechanical support sedation prevents liberation from ventilator at this time.  Acute encephalopathy/agitated delirium -In the setting of acute ETOH withdrawal and acute critical  illness  P: CIWA protocol PAD guidelines sedation To try to come off Versed infusion which we have been successful Just on Precedex and fentanyl at this time Can goal RASS -1 Add long-acting benzos down tube. Hopeful for extubation within the next 24 to 48 hours  Polysubstance abuse -UDS positive for cocaine, benzodiazepine, and THC EtOH abuse P: Cessation counseling once extubated  Elevated LFTs -Felt secondary to cardiogenic shock with known history of EtOH abuse P: Supportive care  At risk malnutrition  P: Tube feeds  Best practice   Diet:  Tube Feed  Pain/Anxiety/Delirium protocol (if indicated): Yes (RASS goal -1) VAP protocol (if indicated): Yes DVT prophylaxis: Subcutaneous Heparin GI prophylaxis: PPI Glucose control:  SSI Yes Central venous access:  Yes, and it is still needed Arterial line:  N/A Foley:  Yes, and it is still needed Mobility:  bed rest  PT consulted: N/A Last date of multidisciplinary goals of care discussion Daily family update  Code Status:  full code Disposition: ICU   This patient is critically ill with multiple organ system failure; which, requires frequent high complexity decision making, assessment, support, evaluation, and titration of therapies. This was completed through the application of advanced monitoring technologies and extensive interpretation of multiple databases. During this encounter critical care time was devoted to patient care services described in this note for 32 minutes.  Josephine Igo, DO [place] [facility] 10/30/2020 6:53 AM

## 2020-10-30 NOTE — Plan of Care (Signed)
  Problem: Cardiac: Goal: Ability to achieve and maintain adequate cardiopulmonary perfusion will improve Outcome: Progressing   

## 2020-10-30 NOTE — Progress Notes (Signed)
This RN stepped away from pt's room to discard of trash from pt's room.   Ventilator alarming upon return and found the breathing tub partially removed despite pt being restrained. Pt visibly distressed but 02 sats remained adequate. Staff assist called, RTT to bedside, Dr. Tonia Brooms to bedside to eval and reintubate.

## 2020-10-30 NOTE — Progress Notes (Signed)
[facility] Progress Note  Name: Kyle Peck MRN: [id number] DOB: [birth year]  Subjective:  He had 4.5 liters UOP over 5/27.  Got lasix 80 mg IV once.  He is intubated and unable to provide additional hx.    Review of systems:  Unable to obtain 2/2 intubated and sedated ----------  Background on consult Kyle Peck is a 60 y.o. male with past medical history significant for polysubstance abuse.  History is obtained from the chart.  He presented to medical attention due to leg and abdominal swelling, DOE on 5/19.  Found to have an EF of 15 %- given IV lasix and heart cath was planned.  Over the last 24 hours, pt developed acute ETOH withdrawal, had to be sedated-  Then developed resp failure req intubation.  Has become more hypotensive overnight- req pressors which have been maxed out-  Baseline crt under 1, was 0.9 yesterday -  Now anuric and K was 6.8 this AM (getting LR boluses and drip)  With crt of 2.  CCM is at bedside putting in central line, we changed it to a trialysis cath as unfortunately I feel he needs to transfer to [facility] and get CRRT.  He is not that acidotic yet but has a lactate of 4.5  Intake/Output Summary (Last 24 hours) at 10/30/2020 0525 Last data filed at 10/30/2020 0400 Gross per 24 hour  Intake 1966.72 ml  Output 4640 ml  Net -2673.28 ml    Vitals:  Vitals:   10/30/20 0300 10/30/20 0349 10/30/20 0400 10/30/20 0500  BP: 101/70  94/68 93/66  Pulse: 71  72 74  Resp: 18  18 18   Temp: 98.42 F (36.9 C)  98.6 F (37 C) 99.14 F (37.3 C)  TempSrc:   Esophageal   SpO2: 100% 100% 100% 100%  Weight:      Height:         Physical Exam:  General adult male in bed critically ill  HEENT normocephalic atraumatic  Lungs coarse mechanical breath sounds  Heart S1S2 no rub Abdomen soft nontender distended with obese habitus Extremities trace lower extremity edema; trace upper extremity edema Psych no anxiety or agitation but sedated Neuro - sedation  currently running Access RIJ nontunneled dialysis catheter   Medications reviewed   Labs:  BMP Latest Ref Rng & Units 10/30/2020 10/29/2020 10/28/2020  Glucose 70 - 99 mg/dL 10/30/2020) 559(R) 416(L)  BUN 6 - 20 mg/dL 845(X) 64(W) 80(H)  Creatinine 0.61 - 1.24 mg/dL 21(Y 2.48 2.50  Sodium 135 - 145 mmol/L 139 138 137  Potassium 3.5 - 5.1 mmol/L 3.5 4.2 5.3(H)  Chloride 98 - 111 mmol/L 97(L) 102 105  CO2 22 - 32 mmol/L 33(H) 31 28  Calcium 8.9 - 10.3 mg/dL 8.1(L) 8.2(L) 8.2(L)     Assessment/Plan:   Assessment/Plan: 60 year old WM with polysubstance abuse including cocaine and ETOH- presented with new found EF of 15%-  Decompensated and developed AKI and hyperkalemia  1.AKI - ATN secondary to hemodynamic instability. Baseline Cr 0.9.  - monitor off of CRRT.  nonoliguric  - please remove nontunneled dialysis catheter (placed order) - lasix 60 mg IV once   2. Hyperkalemia-  Due to AKI and LR.  Treated with CRRT.  Appears resolved.  Please transition to nepro for feeds if K rises.  Potassium 40 meq once today   3. Acute systolic heart failure with cardiogenic shock.  heart failure team managing  4. Polysubstance abuse - including cocaine and EtOH  5. EtOH Withdrawal - per primary team.    6. Acute hypoxemic resp failure - setting of etoh withdrawal.  Vent per critical care  7. MRSA bacteremia - on vanc. ID consulted; note TEE on 5/24 - await final read  8. Anemia normocytic - no acute need for [facility]  Nephrology will sign off.  Please contact me with any questions.   Estanislado Emms, MD 10/30/2020 5:34 AM

## 2020-10-30 NOTE — Progress Notes (Signed)
RT NOTES: Called to room by RN d/t pt self-extubating. Pt re-intubated at this time.

## 2020-10-30 NOTE — Progress Notes (Signed)
New sutures placed  Simonne Martinet ACNP-BC [facility]monary/Critical Care Pager # [phone] OR # [phone] if no answer

## 2020-10-30 NOTE — Progress Notes (Signed)
Patient ID: Kyle Peck, male   DOB: [birth year], 60 y.o.   MRN: [id number]     Advanced Heart Failure Rounding Note  PCP-Cardiologist: None   Subjective:    Transferred to [facility] from [facility] for shock treatment.  MRSA PNA/bacteremia, on vancomycin.  TEE 5/25: EF 20%, mild RV dysfunction, no endocarditis.  Remains intubated on DBA 2.5. CO-ox 59%. Now off CRRT. Diuresing well on IV lasix.   Awake on vent. Agitated. Follows all commands.    Objective:   Weight Range: 89.2 kg Body mass index is 30.8 kg/m.   Vital Signs:   Temp:  [96.8 F (36 C)-102.2 F (39 C)] 98.06 F (36.7 C) (05/28 0800) Pulse Rate:  [69-115] 102 (05/28 0800) Resp:  [18-31] 19 (05/28 0800) BP: (92-123)/(64-88) 117/87 (05/28 0800) SpO2:  [95 %-100 %] 100 % (05/28 0800) FiO2 (%):  [40 %] 40 % (05/28 0800) Weight:  [89.2 kg] 89.2 kg (05/28 0149) Last BM Date:  (PTA)  Weight change: Filed Weights   10/28/20 0500 10/29/20 0423 10/30/20 0149  Weight: 88.6 kg 89.3 kg 89.2 kg    Intake/Output:   Intake/Output Summary (Last 24 hours) at 10/30/2020 0909 Last data filed at 10/30/2020 0800 Gross per 24 hour  Intake 2628.04 ml  Output 3320 ml  Net -691.96 ml      Physical Exam   General:  Awake. Agitated on vent. CVP 8 HEENT: normal + ETT Neck: supple. no JVD. Carotids 2+ bilat; no bruits. No lymphadenopathy or thryomegaly appreciated. Cor: PMI nondisplaced. Regular rate & rhythm. No rubs, gallops or murmurs. Lungs: clear Abdomen: soft, nontender, nondistended. No hepatosplenomegaly. No bruits or masses. Good bowel sounds. Extremities: no cyanosis, clubbing, rash, edema Neuro: alert & orientedx3, cranial nerves grossly intact. moves all 4 extremities w/o difficulty. Affect pleasant   Telemetry   Sinus 90-100 occasional NSVT Personally reviewed   Labs    CBC Recent Labs    10/28/20 0445  WBC 8.4  HGB 10.8*  HCT 36.6*  MCV 92.0  PLT 625   Basic Metabolic Panel Recent Labs    10/29/20 0442  10/30/20 0310  NA 138 139  K 4.2 3.5  CL 102 97*  CO2 31 33*  GLUCOSE 138* 133*  BUN 29* 27*  CREATININE 0.93 0.73  CALCIUM 8.2* 8.1*  MG 2.0 1.8  PHOS 4.3 3.8   Liver Function Tests Recent Labs    10/29/20 0442 10/30/20 0310  ALBUMIN 2.8* 2.5*   No results for input(s): LIPASE, AMYLASE in the last 72 hours. Cardiac Enzymes No results for input(s): CKTOTAL, CKMB, CKMBINDEX, TROPONINI in the last 72 hours.  BNP: BNP (last 3 results) Recent Labs    10/21/20 0033 10/23/20 0504  BNP 2,252.0* 1,685.0*    ProBNP (last 3 results) No results for input(s): PROBNP in the last 8760 hours.   D-Dimer No results for input(s): DDIMER in the last 72 hours. Hemoglobin A1C No results for input(s): HGBA1C in the last 72 hours. Fasting Lipid Panel No results for input(s): CHOL, HDL, LDLCALC, TRIG, CHOLHDL, LDLDIRECT in the last 72 hours. Thyroid Function Tests No results for input(s): TSH, T4TOTAL, T3FREE, THYROIDAB in the last 72 hours.  Invalid input(s): FREET3  Other results:   Imaging    No results found.   Medications:     Scheduled Medications: . chlordiazePOXIDE  10 mg Per Tube TID  . chlorhexidine gluconate (MEDLINE KIT)  15 mL Mouth Rinse BID  . Chlorhexidine Gluconate Cloth  6 each Topical Daily  .  docusate  100 mg Per Tube BID  . doxazosin  2 mg Per Tube Daily  . enoxaparin (LOVENOX) injection  40 mg Subcutaneous Q24H  . feeding supplement (PROSource TF)  45 mL Per Tube QID  . folic acid  1 mg Per Tube Daily  . mouth rinse  15 mL Mouth Rinse 10 times per day  . mupirocin ointment  1 application Nasal BID  . pantoprazole (PROTONIX) IV  40 mg Intravenous Q24H  . polyethylene glycol  17 g Per Tube BID  . potassium chloride  40 mEq Per Tube Once  . QUEtiapine  100 mg Per Tube BID  . sodium chloride flush  10-40 mL Intracatheter Q12H  . [START ON 11/02/2020] sodium chloride flush  3 mL Intravenous Q12H  . thiamine  100 mg Per Tube Daily   Or  . thiamine   100 mg Intravenous Daily    Infusions: . sodium chloride    . dexmedetomidine (PRECEDEX) IV infusion 1 mcg/kg/hr (10/30/20 0800)  . DOBUTamine 2.5 mcg/kg/min (10/30/20 0800)  . feeding supplement (VITAL 1.5 CAL) 1,000 mL (10/30/20 0045)  . fentaNYL infusion INTRAVENOUS 25 mcg/hr (10/30/20 0800)  . magnesium sulfate bolus IVPB    . midazolam Stopped (10/30/20 0603)  . vancomycin Stopped (10/29/20 2311)    PRN Medications: acetaminophen (TYLENOL) oral liquid 160 mg/5 mL, diphenhydrAMINE, haloperidol lactate, ondansetron (ZOFRAN) IV, sodium chloride flush     Assessment/Plan   1. Shock: Suspect mixed cardiogenic and septic with MRSA on blood cultures.  - Off NE, now on dobutamine 2.5.  Co-ox 59%. Will continue DBA for now. Wean once extubated 2. Acute systolic CHF: Echo this admission with EF 15%, moderate LV dilation, mildly decreased RV function, mild MR. Cause uncertain, could be due to ETOH abuse and cocaine but cannot rule out CAD. Presentation consistent with CHF, not ACS.  HS-TnI in setting of hypotension/shock was 204 => 141. CVP 7-8  Today. Now off CVVH (stopped 5/26). Responding well to IV Lasix. Co-ox 59% on dobutamine 2.5.  - Continue dobutamine 2.5 for now. Will wean once extubated - Off CVVHD. Volume status looks good. Renal managing diuretics - Will start spiro 12.5. Consider ARB/ARNI and low-dose digoxin if renal function again stable in am  - Plan right/left cath, likely on Tuesday, as long as renal fx remains stable.  3. AKI: Creatinine up to 2.09 with persistent hyperkalemia and minimal UOP. Cr now stable 0.7. Now off CVVH and making urine, CVP 7-8 today.   - Renal managing diuretics  - Appreciate nephrology's assistance  4. ETOH abuse with withdrawal.  - CCM managing 5. Cocaine abuse: likely contributing to WD 6. Acute hypoxemic respiratory failure: Initially in setting of ETOH withdrawal and altered mental status.  - Vent wean per CCM.  - D/w Dr. Valeta Harms this am   7. ID: MRSA in blood, ?PNA source.  PCT 1.5 => 1.04.  TEE 5/25 showed no endocarditis.  - Continue vancomycin 8. NSVT - keep K> 4.0 Mg > 2.0 - will supp   CRITICAL CARE Performed by: Glori Bickers  Total critical care time: 35 minutes  Critical care time was exclusive of separately billable procedures and treating other patients.  Critical care was necessary to treat or prevent imminent or life-threatening deterioration.  Critical care was time spent personally by me (independent of midlevel providers or residents) on the following activities: development of treatment plan with patient and/or surrogate as well as nursing, discussions with consultants, evaluation of patient's response to treatment,  examination of patient, obtaining history from patient or surrogate, ordering and performing treatments and interventions, ordering and review of laboratory studies, ordering and review of radiographic studies, pulse oximetry and re-evaluation of patient's condition.    Glori Bickers, MD 10/30/2020 9:09 AM

## 2020-10-30 NOTE — Plan of Care (Signed)
  Problem: Activity: Goal: Capacity to carry out activities will improve Outcome: Not Progressing   Problem: Cardiac: Goal: Ability to achieve and maintain adequate cardiopulmonary perfusion will improve Outcome: Progressing   Problem: Clinical Measurements: Goal: Ability to maintain clinical measurements within normal limits will improve Outcome: Progressing Goal: Will remain free from infection Outcome: Progressing Goal: Diagnostic test results will improve Outcome: Progressing Goal: Respiratory complications will improve Outcome: Not Progressing Goal: Cardiovascular complication will be avoided Outcome: Progressing   Problem: Activity: Goal: Risk for activity intolerance will decrease Outcome: Progressing   Problem: Nutrition: Goal: Adequate nutrition will be maintained Outcome: Progressing   Problem: Coping: Goal: Level of anxiety will decrease Outcome: Not Progressing   Problem: Elimination: Goal: Will not experience complications related to bowel motility Outcome: Not Progressing Goal: Will not experience complications related to urinary retention Outcome: Not Progressing   Problem: Pain Managment: Goal: General experience of comfort will improve Outcome: Progressing   Problem: Safety: Goal: Ability to remain free from injury will improve Outcome: Progressing   Problem: Skin Integrity: Goal: Risk for impaired skin integrity will decrease Outcome: Progressing   Problem: Safety: Goal: Non-violent Restraint(s) Outcome: Progressing   Problem: Activity: Goal: Ability to tolerate increased activity will improve Outcome: Progressing   Problem: Respiratory: Goal: Ability to maintain a clear airway and adequate ventilation will improve Outcome: Progressing   Problem: Role Relationship: Goal: Method of communication will improve Outcome: Not Progressing   Problem: Safety: Goal: Violent Restraint(s) Outcome: Progressing

## 2020-10-30 NOTE — Procedures (Signed)
Intubation Procedure Note  Stephanie Mcglone  [id number]  1960-07-15  Date:10/30/20  Time:7:16 PM   Provider Performing:Lemya Greenwell L Vincient Vanaman   Procedure: Intubation (31500)  Indication(s) Respiratory Failure  Consent Unable to obtain consent due to emergent nature of procedure.  Anesthesia Etomidate, Versed, Fentanyl and Rocuronium  Time Out Verified patient identification, verified procedure, site/side was marked, verified correct patient position, special equipment/implants available, medications/allergies/relevant history reviewed, required imaging and test results available.  Sterile Technique Usual hand hygeine, masks, and gloves were used  Procedure Description Patient positioned in bed supine.  Sedation given as noted above.  Patient was intubated with endotracheal tube using Glidescope.  View was Grade 1 full glottis .  Number of attempts was 1.  Colorimetric CO2 detector was consistent with tracheal placement.  Complications/Tolerance None; patient tolerated the procedure well. Chest X-ray is ordered to verify placement.  EBL Minimal  Specimen(s) None   Josephine Igo, DO [facility] 10/30/2020 7:17 PM

## 2020-10-31 DIAGNOSIS — J810 Acute pulmonary edema: Secondary | ICD-10-CM

## 2020-10-31 LAB — RENAL FUNCTION PANEL
Albumin: 2.4 g/dL — ABNORMAL LOW (ref 3.5–5.0)
Anion gap: 5 (ref 5–15)
BUN: 26 mg/dL — ABNORMAL HIGH (ref 6–20)
CO2: 32 mmol/L (ref 22–32)
Calcium: 8.1 mg/dL — ABNORMAL LOW (ref 8.9–10.3)
Chloride: 99 mmol/L (ref 98–111)
Creatinine, Ser: 0.56 mg/dL — ABNORMAL LOW (ref 0.61–1.24)
GFR, Estimated: >60 mL/min (ref >60)
Glucose, Bld: 137 mg/dL — ABNORMAL HIGH (ref 70–99)
Phosphorus: 4.8 mg/dL — ABNORMAL HIGH (ref 2.5–4.6)
Potassium: 3.9 mmol/L (ref 3.5–5.1)
Sodium: 136 mmol/L (ref 135–145)

## 2020-10-31 LAB — GLUCOSE, CAPILLARY
Glucose-Capillary: 131 mg/dL — ABNORMAL HIGH (ref 70–99)
Glucose-Capillary: 121 mg/dL — ABNORMAL HIGH (ref 70–99)
Glucose-Capillary: 117 mg/dL — ABNORMAL HIGH (ref 70–99)

## 2020-10-31 LAB — COOXEMETRY PANEL
Carboxyhemoglobin: 1.2 % (ref 0.5–1.5)
Methemoglobin: 0.6 % (ref 0.0–1.5)
O2 Saturation: 63.4 %
Total hemoglobin: 12.9 g/dL (ref 12.0–16.0)

## 2020-10-31 LAB — MAGNESIUM: Magnesium: 1.9 mg/dL (ref 1.7–2.4)

## 2020-10-31 MED ORDER — POLYETHYLENE GLYCOL 3350 17 G PO PACK
17.0000 g | PACK | Freq: Two times a day (BID) | ORAL | Status: DC
  Filled 2020-10-31: qty 1

## 2020-10-31 MED ORDER — DIPHENHYDRAMINE HCL 25 MG PO CAPS: 25.0000 mg | ORAL_CAPSULE | Freq: Four times a day (QID) | ORAL | Status: DC | PRN

## 2020-10-31 MED ORDER — DOCUSATE SODIUM 100 MG PO CAPS
100.0000 mg | ORAL_CAPSULE | Freq: Two times a day (BID) | ORAL | Status: DC
  Administered 2020-10-31 – 2020-11-04 (×5): 100 mg via ORAL
  Filled 2020-10-31 (×5): qty 1

## 2020-10-31 MED ORDER — QUETIAPINE FUMARATE 50 MG PO TABS
100.0000 mg | ORAL_TABLET | Freq: Two times a day (BID) | ORAL | Status: DC
  Administered 2020-10-31 – 2020-11-04 (×7): 100 mg via ORAL
  Filled 2020-10-31 (×2): qty 2
  Filled 2020-10-31: qty 1
  Filled 2020-10-31 (×2): qty 2
  Filled 2020-10-31: qty 1
  Filled 2020-10-31: qty 2

## 2020-10-31 MED ORDER — VANCOMYCIN HCL 1250 MG/250ML IV SOLN
1250.0000 mg | Freq: Three times a day (TID) | INTRAVENOUS | Status: DC
  Administered 2020-10-31 – 2020-11-03 (×10): 1250 mg via INTRAVENOUS
  Filled 2020-10-31 (×12): qty 250

## 2020-10-31 MED ORDER — ACETAMINOPHEN 160 MG/5ML PO SOLN
650.0000 mg | ORAL | Status: DC | PRN
  Administered 2020-11-03: 650 mg via ORAL
  Filled 2020-10-31: qty 20.3

## 2020-10-31 MED ORDER — TAMSULOSIN HCL 0.4 MG PO CAPS
0.4000 mg | ORAL_CAPSULE | Freq: Every day | ORAL | Status: AC
  Administered 2020-10-31 – 2020-11-01 (×2): 0.4 mg via ORAL
  Filled 2020-10-31 (×2): qty 1

## 2020-10-31 MED ORDER — FUROSEMIDE 10 MG/ML IJ SOLN
60.0000 mg | Freq: Every day | INTRAMUSCULAR | Status: AC
  Administered 2020-10-31: 60 mg via INTRAVENOUS
  Filled 2020-10-31: qty 6

## 2020-10-31 MED ORDER — DIGOXIN 125 MCG PO TABS
0.1250 mg | ORAL_TABLET | Freq: Every day | ORAL | Status: DC
  Administered 2020-10-31 – 2020-11-04 (×4): 0.125 mg via ORAL
  Filled 2020-10-31 (×4): qty 1

## 2020-10-31 MED ORDER — ORAL CARE MOUTH RINSE
15.0000 mL | Freq: Two times a day (BID) | OROMUCOSAL | Status: DC
  Administered 2020-10-31: 15 mL via OROMUCOSAL

## 2020-10-31 MED ORDER — CHLORDIAZEPOXIDE HCL 5 MG PO CAPS
10.0000 mg | ORAL_CAPSULE | Freq: Three times a day (TID) | ORAL | Status: DC
  Administered 2020-10-31 – 2020-11-04 (×9): 10 mg via ORAL
  Filled 2020-10-31 (×9): qty 2

## 2020-10-31 MED ORDER — FOLIC ACID 1 MG PO TABS
1.0000 mg | ORAL_TABLET | Freq: Every day | ORAL | Status: DC
  Administered 2020-11-01 – 2020-11-04 (×3): 1 mg via ORAL
  Filled 2020-10-31 (×3): qty 1

## 2020-10-31 MED ORDER — CHLORHEXIDINE GLUCONATE CLOTH 2 % EX PADS
6.0000 | MEDICATED_PAD | Freq: Every day | CUTANEOUS | Status: DC
  Administered 2020-10-31 – 2020-11-04 (×6): 6 via TOPICAL

## 2020-10-31 MED ORDER — THIAMINE HCL 100 MG PO TABS
100.0000 mg | ORAL_TABLET | Freq: Every day | ORAL | Status: DC
  Administered 2020-11-01 – 2020-11-04 (×3): 100 mg via ORAL
  Filled 2020-10-31 (×3): qty 1

## 2020-10-31 MED ORDER — THIAMINE HCL 100 MG/ML IJ SOLN: 100.0000 mg | Freq: Every day | INTRAMUSCULAR | Status: DC

## 2020-10-31 NOTE — Evaluation (Signed)
Clinical/Bedside Swallow Evaluation Patient Details  Name: Kyle Peck MRN: [id number] Date of Birth: [birth year]  Today's Date: 10/31/2020 Time: SLP Start Time (ACUTE ONLY): 1408 SLP Stop Time (ACUTE ONLY): 1436 SLP Time Calculation (min) (ACUTE ONLY): 28 min  Past Medical History:  Past Medical History:  Diagnosis Date  . Alcohol use   . Arthritis   . Marijuana use   . Tobacco abuse    Past Surgical History:  Past Surgical History:  Procedure Laterality Date  . FRACTURE SURGERY     cheek    HPI:   60 y.o. male with medical history significant for tobacco use and occasional alcohol use who presented to the emergency department due to several days of leg and abdominal swelling.  Patient complained of increased leg swelling, testicular swelling and abdominal swelling which started a few days ago, he complained of occasional shortness of breath when he lays flat at night, but denies chest pain, fever, chills, abdominal pain.  Patient states that he has not been to any doctors office in more than  20 years.on 10/20/20 at [facility]; intubated on 10/25/20 and HD cath placed for initiation of CRRT, transferred to [facility] for CHF evaluation and continued care  Pt intubated from 10/25/20-10/31/20. CXR indicated Stable cardiomegaly with pleural effusions and basilar consolidation. BSE generated d/t post-extubation swallowing concerns by nursing.  Assessment / Plan / Recommendation Clinical Impression  Pt seen for a clinical swallowing evaluation post-extubation with reversible acute oropharyngeal dysphagia noted.  Pt extubated this date after 7 day intubation with low intensity, hoarse vocal quality observed within simple conversation.  Pt with immediate cough after larger volume sips/straw of thin liquids, but this was eliminated with 1/2 tsp amounts of thin given.   Education provided to family re: swallowing safety/post-extubation edema impacting overall swallow function, especially  with thin liquids.  Pt with impaired mastication with solids with slow oral preparation/propulsion noted as well.  Mentation impacting swallow function as pt min confused/agitated for portion of evaluation with impulsivity/decreased sustained attention noted during end of session requiring min-mod verbal cues for reorientation/safety.  Recommend Dysphagia 1/thin liquids via 1/2 tsp amounts ONLY; ice chips allowed 1 at a time with FULL supervision.  ST will f/u while in acute setting for swallowing safety/education and potential upgraded diet as pt progresses. Thank you for this consultation.   SLP Visit Diagnosis: Dysphagia, unspecified (R13.10)    Aspiration Risk  Mild aspiration risk    Diet Recommendation   Dysphagia 1 (puree)/thin liquids via 1/2 tsp amounts only  Medication Administration: Crushed with puree    Other  Recommendations Recommended Consults: Consider GI evaluation Oral Care Recommendations: Oral care BID   Follow up Recommendations Other (comment) (TBD)      Frequency and Duration min 2x/week  1 week       Prognosis Prognosis for Safe Diet Advancement: Good      Swallow Study   General Date of Onset: 10/21/20 HPI: 60 y.o. male with medical history significant for tobacco use and occasional alcohol use who presents to the emergency department due to several days of leg and abdominal swelling.  Patient complained of increased leg swelling, testicular swelling and abdominal swelling which started a few days ago, he complained of occasional shortness of breath when he lays flat at night, but denies chest pain, fever, chills, abdominal pain.  Patient states that he has not been to any doctors office in more than  20 years. Type of Study: Bedside Swallow Evaluation Previous  Swallow Assessment: n/a Diet Prior to this Study: NPO Temperature Spikes Noted: No Respiratory Status: Nasal cannula (4L) History of Recent Intubation: Yes Length of Intubations (days): 7 days Date  extubated: 10/31/20 Behavior/Cognition: Alert;Cooperative;Confused;Distractible Oral Cavity Assessment: Within Functional Limits Oral Care Completed by SLP: Recent completion by staff Oral Cavity - Dentition: Adequate natural dentition Self-Feeding Abilities: Able to feed self;Needs assist;Other (Comment) (needs cues to slowly eat/drink) Patient Positioning: Upright in bed Baseline Vocal Quality: Low vocal intensity;Hoarse Volitional Cough: Strong Volitional Swallow: Able to elicit    Oral/Motor/Sensory Function Overall Oral Motor/Sensory Function: Generalized oral weakness   Ice Chips Ice chips: Within functional limits Presentation: Spoon (1 at a time)   Thin Liquid Thin Liquid: Impaired Presentation: Cup;Spoon;Straw Pharyngeal  Phase Impairments: Cough - Immediate    Nectar Thick Nectar Thick Liquid: Not tested   Honey Thick Honey Thick Liquid: Not tested   Puree Puree: Impaired Presentation: Spoon Pharyngeal Phase Impairments: Suspected delayed Swallow   Solid     Solid: Impaired Presentation: Spoon Oral Phase Impairments: Impaired mastication Oral Phase Functional Implications: Impaired mastication;Prolonged oral transit Pharyngeal Phase Impairments: Suspected delayed Swallow      Tressie Stalker, M.S., CCC-SLP 10/31/2020,3:05 PM

## 2020-10-31 NOTE — Procedures (Signed)
Extubation Procedure Note  Patient Details:   Name: Kyle Peck DOB: 04-19-1961 MRN: [id number]   Airway Documentation:    Vent end date: 10/31/20 Vent end time: 0824   Evaluation  O2 sats: stable throughout Complications: No apparent complications Patient did tolerate procedure well. Bilateral Breath Sounds: Clear,Diminished   Pt extubated to 4L [place] per MD order. Pt had positive cuff leak prior to extubation. No stridor noted. Pt able to voice his name.   Guss Bunde 10/31/2020, 8:25 AM

## 2020-10-31 NOTE — Progress Notes (Signed)
Pharmacy Antibiotic Note  Kyle Peck is a 60 y.o. male admitted on 10/20/2020 with bacteremia.  Pharmacy has been consulted for Vanco dosing.  ID: Tmax 99 currently, Lactate 1.6, PCT 1.01, WBC WNL, Scr 0.93>0.5 this morning great UOP. (CVVHD off x48h) - TTE and TEE were negative for endocarditis  Given dramatic improvement in scr will reassess vancomycin dosing.  Vancomycin 1250 mg IV Q  hrs. Goal AUC 400-550. Expected AUC: 485 SCr used: 0.5   Vanco 5/23 >> Zosyn 5/23 >>5/24  5/27 PM : VR 8  5/25 Bcx ng 1 day 5/23 Bcx: 2/4 MRSA  MRSA PCR: positive Hepatitis/HIV panel neg 5/25 TEE neg for vegetations   Plan: Vancomycin 1250 mg IV Q 8 hrs. Goal AUC 400-550. Adjust dose as needed for weight and changing renal function. Peak/trough at steady state for AUC= likely 5/30   Height: 5\' 7"  (170.2 cm) Weight: 85.3 kg (188 lb 0.8 oz) IBW/kg (Calculated) : 66.1  Temp (24hrs), Avg:98.2 F (36.8 C), Min:96.08 F (35.6 C), Max:99.7 F (37.6 C)  Recent Labs  Lab 10/25/20 0537 10/25/20 0654 10/25/20 0947 10/25/20 1753 10/26/20 0241 10/26/20 0319 10/26/20 1701 10/28/20 0445 10/28/20 1709 10/29/20 0442 10/29/20 1855 10/30/20 0310 10/31/20 0326  WBC 7.2  --   --   --   --  6.7  --  8.4  --   --   --   --   --   CREATININE 1.82* 2.07* 1.83*   < > 1.40*  --    < > 0.88  0.83 0.86 0.93  --  0.73 0.56*  LATICACIDVEN  --  4.5* 3.8*  --  1.6  --   --   --   --   --   --   --   --   VANCORANDOM  --   --   --   --   --  10  --   --   --   --  8  --   --    < > = values in this interval not displayed.    Estimated Creatinine Clearance: 102.5 mL/min (A) (by C-G formula based on SCr of 0.56 mg/dL (L)).    No Known Allergies  11/02/20 PharmD., BCPS Clinical Pharmacist 10/31/2020 9:48 AM

## 2020-10-31 NOTE — Progress Notes (Signed)
NAMETerius Peck, MRN:  [id number], DOB:  1960/11/12, LOS: 10 ADMISSION DATE:  10/20/2020, CONSULTATION DATE:  10/31/2020  REFERRING MD:  Gwendolyn Lima, CHIEF COMPLAINT: Hypotension, post intubation  History of Present Illness:  60 year old EtOH and cocaine user admitted 5/18 with leg and abdominal swelling.  Echo showed EF of 15% with global hypokinesis, cardiology consultation advised cardiac cath after adequate medical management.  He was treated for alcohol withdrawal with Ativan and on 5/22 transferred to ICU and placed on Precedex but eventually required intubation .  He subsequently became hypotensive and required increasing Levophed and vasopressin through the night.  On 5/23, he was noted to have AKI & hyperkalemia, PCCM consulted  Pertinent  Medical History  ETOH use Marijuana and cocaine use UDS + cocaine   Significant Hospital Events: Including procedures, antibiotic start and stop dates in addition to other pertinent events    5/18 Admitted to [facility]  5/23 Intubated and HD cath placed for initiation of CRRT, transferred to [facility] for CHF evaluation and continued care   5/26 vent, pressors, cvvhd, high sedation requirements   5/27 CVVHD stopped, Foley pulled  5/28 urinary retention, Foley replaced  5/28 evening, self extubated and reintubated  5/29 extubated on precedex   Interim History / Subjective:   Patient following commands, tolerating SAT SBT this morning.  Still agitated at times.  Objective   Blood pressure 98/67, pulse 84, temperature 99.7 F (37.6 C), temperature source Esophageal, resp. rate 18, height 5\' 7"  (1.702 m), weight 85.3 kg, SpO2 99 %. CVP:  [5 mmHg-16 mmHg] 8 mmHg  Vent Mode: PRVC FiO2 (%):  [30 %-40 %] 30 % Set Rate:  [18 bmp] 18 bmp Vt Set:  [520 mL] 520 mL PEEP:  [5 cmH20] 5 cmH20 Pressure Support:  [5 cmH20] 5 cmH20 Plateau Pressure:  [15 cmH20-17 cmH20] 17 cmH20   Intake/Output Summary (Last 24 hours) at 10/31/2020 0657 Last data  filed at 10/31/2020 0600 Gross per 24 hour  Intake 4110.33 ml  Output 5330 ml  Net -1219.67 ml   Filed Weights   10/29/20 0423 10/30/20 0149 10/31/20 0615  Weight: 89.3 kg 89.2 kg 85.3 kg    Examination: General: Chronically critically ill appearing gentleman, appears older than stated age intubated on mechanical life support HEENT: Endotracheal tube in place Neuro: Sedated on Precedex alone, is able to follow some basic commands, easily agitated and thrashing in the bed CV: Regular rate rhythm, S1-S2 PULM: Bilateral mechanically ventilated breath sounds GI: Soft, nontender nondistended, obese Extremities: No significant edema Skin: No rash  Labs/imaging that I have personally reviewed    5/19 ECHO with EF 15% with global hypokinesis   5/24 pertinent labs: Creatinine 1.4., Elevated LFTs, procal 1.52, WBC 6.7  5/27 sodium 138, creatinine 0.93  Sodium 136 Chloride 99 BUN 26 Serum creatinine 0.56 Magnesium 1.9  Resolved Hospital Problem list     Assessment & Plan:   Shock  Cardiogenic shock -ECHO with EF of 15%  -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 Patient spouse denies any known IV drug P: Remains on low-dose dobutamine Diuresis with Lasix Appreciate advanced heart failure input. Possible right left heart cath Tuesday of next week.  Acute kidney injury, acute renal failure requiring CVVHD, CVVHD stopped Hyperkalemia, resolved -Cardiorenal versus ATN related to hypotension in the last 12 hours P: Continue diuresis Follow urine output and kidney function  Acute Hypoxic Respiratory Failure  -In the setting acute CHF and severe ETOH withdrawal  P:  Plan for trial of controlled extubation today. Remains in the ICU for close observation post extubation. We need to make sure that he can control his secretions.  Acute  encephalopathy/agitated delirium -In the setting of acute ETOH withdrawal and acute critical illness  P: Remains on Precedex We will plan to extubate on Precedex.  Polysubstance abuse -UDS positive for cocaine, benzodiazepine, and THC EtOH abuse P: Cessation counseling once able  Elevated LFTs -Felt secondary to cardiogenic shock with known history of EtOH abuse P: Supportive care  At risk malnutrition  P: Tube feeds on hold for extubation Likely needs core track placed Monday Discussed with nursing  Mobility: PT OT orders placed  Best practice   Diet:  Tube Feed  Pain/Anxiety/Delirium protocol (if indicated): Yes (RASS goal -1) VAP protocol (if indicated): Yes DVT prophylaxis: Subcutaneous Heparin GI prophylaxis: PPI Glucose control:  SSI Yes Central venous access:  Yes, and it is still needed Arterial line:  N/A Foley:  Yes, and it is still needed Mobility:  bed rest  PT consulted: Yes Last date of multidisciplinary goals of care discussion Daily family update  Code Status:  full code Disposition: ICU   This patient is critically ill with multiple organ system failure; which, requires frequent high complexity decision making, assessment, support, evaluation, and titration of therapies. This was completed through the application of advanced monitoring technologies and extensive interpretation of multiple databases. During this encounter critical care time was devoted to patient care services described in this note for 32 minutes.  Josephine Igo, DO [facility] 10/31/2020 6:58 AM

## 2020-10-31 NOTE — Plan of Care (Signed)
  Problem: Activity: Goal: Capacity to carry out activities will improve Outcome: Progressing Note: Able to increase activity today   Problem: Cardiac: Goal: Ability to achieve and maintain adequate cardiopulmonary perfusion will improve Outcome: Progressing Note: Dobutamine drip down titrated today, remains pink/warm/dry with good mentation, no dyspnea/able to tolerate increased activity, urine output adequate, BP low normal   Problem: Education: Goal: Knowledge of General Education information will improve Description: Including pain rating scale, medication(s)/side effects and non-pharmacologic comfort measures Outcome: Progressing Note: reorienting patient to hospital course and situation today   Problem: Clinical Measurements: Goal: Ability to maintain clinical measurements within normal limits will improve Outcome: Progressing Goal: Will remain free from infection Outcome: Progressing Goal: Diagnostic test results will improve Outcome: Progressing Goal: Respiratory complications will improve Outcome: Progressing Note: Pt is doing well on nasal cannual Oxygen, doing IS, cough & deep breathing exercises, cough is strong and productive Goal: Cardiovascular complication will be avoided Outcome: Progressing   Problem: Activity: Goal: Risk for activity intolerance will decrease Outcome: Progressing   Problem: Nutrition: Goal: Adequate nutrition will be maintained Outcome: Progressing Note: TFs are off d/t extubation, swallow eval today, taking liquids as able/per plan, will need further nutrition eval   Problem: Coping: Goal: Level of anxiety will decrease Outcome: Progressing   Problem: Elimination: Goal: Will not experience complications related to bowel motility Outcome: Progressing Note: Pt had BMs today Goal: Will not experience complications related to urinary retention Outcome: Progressing Note: Pt still has Foley but medications ordered to help with retention    Problem: Pain Managment: Goal: General experience of comfort will improve Outcome: Progressing   Problem: Safety: Goal: Ability to remain free from injury will improve Outcome: Progressing Note: While patient is restless and moves often, he is compliant with safety measures and understand how to minimize fall risk   Problem: Skin Integrity: Goal: Risk for impaired skin integrity will decrease Outcome: Progressing   Problem: Safety: Goal: Non-violent Restraint(s) Outcome: Completed/Met Note: Pt is now unrestrained   Problem: Activity: Goal: Ability to tolerate increased activity will improve Outcome: Completed/Met   Problem: Respiratory: Goal: Ability to maintain a clear airway and adequate ventilation will improve Outcome: Completed/Met   Problem: Role Relationship: Goal: Method of communication will improve Outcome: Completed/Met   Problem: Safety: Goal: Violent Restraint(s) Outcome: Completed/Met

## 2020-10-31 NOTE — Progress Notes (Addendum)
Patient ID: Kyle Peck, male   DOB: [birth year], 60 y.o.   MRN: [id number]     Advanced Heart Failure Rounding Note  PCP-Cardiologist: None   Subjective:    Transferred to [facility] from [facility] for shock treatment.  MRSA PNA/bacteremia, on vancomycin.  TEE 5/25: EF 20%, mild RV dysfunction, no endocarditis.  On DBA 2.5 Co-ox 63%  CVP 8  Self-extubated yesterday and reintubated. Just extubated again this am. Very agitated. Worried about his grandkids. Denies CP or SOB    Objective:   Weight Range: 85.3 kg Body mass index is 29.45 kg/m.   Vital Signs:   Temp:  [96.08 F (35.6 C)-99.7 F (37.6 C)] 99.14 F (37.3 C) (05/29 0800) Pulse Rate:  [70-108] 100 (05/29 0824) Resp:  [10-24] 19 (05/29 0824) BP: (86-121)/(60-82) 103/76 (05/29 0800) SpO2:  [96 %-100 %] 96 % (05/29 0824) FiO2 (%):  [30 %-40 %] 30 % (05/29 0800) Weight:  [85.3 kg] 85.3 kg (05/29 0615) Last BM Date:  (none documented this admission, bowel regimen in place)  Weight change: Filed Weights   10/29/20 0423 10/30/20 0149 10/31/20 0615  Weight: 89.3 kg 89.2 kg 85.3 kg    Intake/Output:   Intake/Output Summary (Last 24 hours) at 10/31/2020 0831 Last data filed at 10/31/2020 0800 Gross per 24 hour  Intake 3503.27 ml  Output 4180 ml  Net -676.73 ml      Physical Exam   General:  Sitting up in bed. Agitated HEENT: normal Neck: supple. no JVD. Carotids 2+ bilat; no bruits. No lymphadenopathy or thryomegaly appreciated. Cor: PMI nondisplaced. Regular rate & rhythm. No rubs, gallops or murmurs. Lungs: clear Abdomen: soft, nontender, nondistended. No hepatosplenomegaly. No bruits or masses. Good bowel sounds. Extremities: no cyanosis, clubbing, rash, edema Neuro: Awake alert and follows commands Agitated   Telemetry   Sinus 90-100 Personally reviewed   Labs    CBC No results for input(s): WBC, NEUTROABS, HGB, HCT, MCV, PLT in the last 72 hours. Basic Metabolic Panel Recent Labs    10/30/20 0310  10/31/20 0326  NA 139 136  K 3.5 3.9  CL 97* 99  CO2 33* 32  GLUCOSE 133* 137*  BUN 27* 26*  CREATININE 0.73 0.56*  CALCIUM 8.1* 8.1*  MG 1.8 1.9  PHOS 3.8 4.8*   Liver Function Tests Recent Labs    10/30/20 0310 10/31/20 0326  ALBUMIN 2.5* 2.4*   No results for input(s): LIPASE, AMYLASE in the last 72 hours. Cardiac Enzymes No results for input(s): CKTOTAL, CKMB, CKMBINDEX, TROPONINI in the last 72 hours.  BNP: BNP (last 3 results) Recent Labs    10/21/20 0033 10/23/20 0504  BNP 2,252.0* 1,685.0*    ProBNP (last 3 results) No results for input(s): PROBNP in the last 8760 hours.   D-Dimer No results for input(s): DDIMER in the last 72 hours. Hemoglobin A1C No results for input(s): HGBA1C in the last 72 hours. Fasting Lipid Panel No results for input(s): CHOL, HDL, LDLCALC, TRIG, CHOLHDL, LDLDIRECT in the last 72 hours. Thyroid Function Tests No results for input(s): TSH, T4TOTAL, T3FREE, THYROIDAB in the last 72 hours.  Invalid input(s): FREET3  Other results:   Imaging    DG Chest Port 1 View  Result Date: 10/30/2020 CLINICAL DATA:  Intubated EXAM: PORTABLE CHEST 1 VIEW COMPARISON:  10/30/2020, 10/28/2020, 10/27/2020 FINDINGS: Endotracheal tube tip is about 3.1 cm superior to carina. Esophageal tube tip below the diaphragm, side port in the GE junction region. Left IJ catheter tip over left brachiocephalic  region. Cardiomegaly with aortic atherosclerosis and vascular congestion. Similar bilateral pleural effusions and basilar consolidations. Dilated bowel in the upper abdomen IMPRESSION: 1. Endotracheal tube tip about 3.1 cm superior to carina 2. Esophageal tube tip overlies the proximal to mid stomach, side-port near GE junction region, further advancement could be considered for more optimal positioning. 3. Left IJ central venous catheter tip overlying expected location of left brachiocephalic vein 4. Stable cardiomegaly with pleural effusions and basilar  consolidation Electronically Signed   By: Donavan Foil M.D.   On: 10/30/2020 19:51   DG Chest Port 1 View  Result Date: 10/30/2020 CLINICAL DATA:  Central line placement. EXAM: PORTABLE CHEST 1 VIEW COMPARISON:  Oct 28, 2020 FINDINGS: Left internal jugular approach central catheter terminates at the expected location of the left brachiocephalic vein. No evidence of pneumothorax. Right internal jugular approach catheter sheath is no longer visualized. Endotracheal tube in satisfactory position. Enteric catheter collimated off the image. Stable findings of mild interstitial pulmonary edema and likely bilateral layering pleural effusions. IMPRESSION: 1. Left internal jugular approach central catheter terminates at the expected location of the left brachiocephalic vein. No evidence of pneumothorax. 2. Stable findings of mild interstitial pulmonary edema and likely bilateral layering pleural effusions. Electronically Signed   By: Fidela Salisbury M.D.   On: 10/30/2020 15:21     Medications:     Scheduled Medications: . chlordiazePOXIDE  10 mg Per Tube TID  . chlorhexidine gluconate (MEDLINE KIT)  15 mL Mouth Rinse BID  . Chlorhexidine Gluconate Cloth  6 each Topical Daily  . docusate  100 mg Per Tube BID  . doxazosin  2 mg Per Tube Daily  . enoxaparin (LOVENOX) injection  40 mg Subcutaneous Q24H  . feeding supplement (PROSource TF)  45 mL Per Tube QID  . folic acid  1 mg Per Tube Daily  . mouth rinse  15 mL Mouth Rinse 10 times per day  . pantoprazole (PROTONIX) IV  40 mg Intravenous Q24H  . polyethylene glycol  17 g Per Tube BID  . QUEtiapine  100 mg Per Tube BID  . sodium chloride flush  10-40 mL Intracatheter Q12H  . [START ON 11/02/2020] sodium chloride flush  3 mL Intravenous Q12H  . spironolactone  12.5 mg Oral Daily  . thiamine  100 mg Per Tube Daily   Or  . thiamine  100 mg Intravenous Daily    Infusions: . sodium chloride    . dexmedetomidine (PRECEDEX) IV infusion 1.5  mcg/kg/hr (10/31/20 0800)  . DOBUTamine 2.5 mcg/kg/min (10/31/20 0800)  . feeding supplement (VITAL 1.5 CAL) Stopped (10/31/20 0820)  . fentaNYL infusion INTRAVENOUS 200 mcg/hr (10/31/20 0800)  . norepinephrine (LEVOPHED) Adult infusion Stopped (10/30/20 2142)  . vancomycin Stopped (10/30/20 2300)    PRN Medications: acetaminophen (TYLENOL) oral liquid 160 mg/5 mL, diphenhydrAMINE, fentaNYL, haloperidol lactate, midazolam, ondansetron (ZOFRAN) IV, sodium chloride flush     Assessment/Plan   1. Shock: Suspect mixed cardiogenic and septic with MRSA on blood cultures.  - Off NE, now on dobutamine 2.5.  Co-ox 59%. Will continue DBA for now. Wean once extubated 2. Acute systolic CHF: Echo this admission with EF 15%, moderate LV dilation, mildly decreased RV function, mild MR. Cause uncertain, could be due to ETOH abuse and cocaine but cannot rule out CAD. Presentation consistent with CHF, not ACS.  HS-TnI in setting of hypotension/shock was 204 => 141. CVP 6-7 today. Now off CVVH (stopped 5/26). Responding well to IV Lasix. Co-ox 63% on DBA 2.5 -  Will turn DBA down to 1 today. Likely stop tomorrow - Off CVVHD. Volume status looks good. Renal has been managing diuretics. Now signed off. Will give lasix 60 IV today. Can switch to po torsemide tomorrow once cor-trak in  - Continue spiro 12.5. Start dig. Consider ARNI/ARB post cath.  - Plan right/left cath, likely on Tuesday, as long as renal fx remains stable.  3. AKI: Creatinine up to 2.09 with persistent hyperkalemia and minimal UOP. Cr now stable 0.6. Now off CVVH and making urine, CVP 6-7 today.   - Renal has signed off 4. ETOH abuse with withdrawal.  - CCM managing 5. Cocaine abuse: likely contributing to WD 6. Acute hypoxemic respiratory failure: Initially in setting of ETOH withdrawal and altered mental status.  - Extubated this am  - D/w Dr. Icard 7. ID: MRSA in blood, ?PNA source.  PCT 1.5 => 1.04.  TEE 5/25 showed no endocarditis.   - Continue vancomycin 8. NSVT - keep K> 4.0 Mg > 2.0 - will supp    , MD 10/31/2020 8:31 AM  

## 2020-11-01 LAB — CULTURE, BLOOD (ROUTINE X 2)
Culture: NO GROWTH
Culture: NO GROWTH
Special Requests: ADEQUATE
Special Requests: ADEQUATE

## 2020-11-01 LAB — MAGNESIUM
Magnesium: 1.7 mg/dL (ref 1.7–2.4)
Magnesium: 2.3 mg/dL (ref 1.7–2.4)

## 2020-11-01 LAB — BASIC METABOLIC PANEL
Anion gap: 6 (ref 5–15)
BUN: 16 mg/dL (ref 6–20)
CO2: 30 mmol/L (ref 22–32)
Calcium: 8 mg/dL — ABNORMAL LOW (ref 8.9–10.3)
Chloride: 98 mmol/L (ref 98–111)
Creatinine, Ser: 0.75 mg/dL (ref 0.61–1.24)
GFR, Estimated: >60 mL/min (ref >60)
Glucose, Bld: 120 mg/dL — ABNORMAL HIGH (ref 70–99)
Potassium: 3.8 mmol/L (ref 3.5–5.1)
Sodium: 134 mmol/L — ABNORMAL LOW (ref 135–145)

## 2020-11-01 LAB — RENAL FUNCTION PANEL
Albumin: 2.6 g/dL — ABNORMAL LOW (ref 3.5–5.0)
Anion gap: 6 (ref 5–15)
BUN: 19 mg/dL (ref 6–20)
CO2: 34 mmol/L — ABNORMAL HIGH (ref 22–32)
Calcium: 8.3 mg/dL — ABNORMAL LOW (ref 8.9–10.3)
Chloride: 98 mmol/L (ref 98–111)
Creatinine, Ser: 0.68 mg/dL (ref 0.61–1.24)
GFR, Estimated: >60 mL/min (ref >60)
Glucose, Bld: 77 mg/dL (ref 70–99)
Phosphorus: 3.9 mg/dL (ref 2.5–4.6)
Potassium: 3.3 mmol/L — ABNORMAL LOW (ref 3.5–5.1)
Sodium: 138 mmol/L (ref 135–145)

## 2020-11-01 LAB — GLUCOSE, CAPILLARY
Glucose-Capillary: 85 mg/dL (ref 70–99)
Glucose-Capillary: 115 mg/dL — ABNORMAL HIGH (ref 70–99)

## 2020-11-01 LAB — COOXEMETRY PANEL
Carboxyhemoglobin: 1.5 % (ref 0.5–1.5)
Methemoglobin: 1 % (ref 0.0–1.5)
O2 Saturation: 62.3 %
Total hemoglobin: 11.5 g/dL — ABNORMAL LOW (ref 12.0–16.0)

## 2020-11-01 MED ORDER — GUAIFENESIN-DM 100-10 MG/5ML PO SYRP
5.0000 mL | ORAL_SOLUTION | ORAL | Status: DC | PRN
  Administered 2020-11-01: 5 mL via ORAL
  Filled 2020-11-01: qty 5

## 2020-11-01 MED ORDER — MAGNESIUM SULFATE 4 GM/100ML IV SOLN
4.0000 g | Freq: Once | INTRAVENOUS | Status: AC
  Administered 2020-11-01: 4 g via INTRAVENOUS
  Filled 2020-11-01: qty 100

## 2020-11-01 MED ORDER — FUROSEMIDE 40 MG PO TABS
40.0000 mg | ORAL_TABLET | Freq: Every day | ORAL | Status: DC
  Administered 2020-11-01 – 2020-11-03 (×2): 40 mg via ORAL
  Filled 2020-11-01 (×2): qty 1

## 2020-11-01 MED ORDER — POTASSIUM CHLORIDE CRYS ER 20 MEQ PO TBCR
40.0000 meq | EXTENDED_RELEASE_TABLET | Freq: Two times a day (BID) | ORAL | Status: AC
  Administered 2020-11-01 (×2): 40 meq via ORAL
  Filled 2020-11-01 (×2): qty 2

## 2020-11-01 MED ORDER — ASPIRIN 81 MG PO CHEW
81.0000 mg | CHEWABLE_TABLET | ORAL | Status: AC
  Administered 2020-11-02: 81 mg via ORAL
  Filled 2020-11-01: qty 1

## 2020-11-01 MED ORDER — SODIUM CHLORIDE 0.9 % IV SOLN: 250.0000 mL | INTRAVENOUS | Status: DC | PRN

## 2020-11-01 MED ORDER — SODIUM CHLORIDE 0.9% FLUSH: 3.0000 mL | INTRAVENOUS | Status: DC | PRN

## 2020-11-01 MED ORDER — SODIUM CHLORIDE 0.9 % IV SOLN: INTRAVENOUS | Status: DC

## 2020-11-01 MED ORDER — ANGIOPLASTY BOOK
Freq: Once | Status: AC
  Filled 2020-11-01: qty 1

## 2020-11-01 MED ORDER — TEMAZEPAM 15 MG PO CAPS
30.0000 mg | ORAL_CAPSULE | Freq: Every day | ORAL | Status: DC
  Administered 2020-11-01 – 2020-11-02 (×2): 30 mg via ORAL
  Filled 2020-11-01 (×2): qty 2

## 2020-11-01 MED ORDER — MAGNESIUM SULFATE 4 GM/100ML IV SOLN: 4.0000 g | Freq: Once | INTRAVENOUS | Status: DC

## 2020-11-01 NOTE — Progress Notes (Signed)
  Speech Language Pathology Treatment: Dysphagia  Patient Details Name: Kyle Peck MRN: [id number] DOB: 20-Apr-1961 Today's Date: 11/01/2020 Time: 2831-5176 SLP Time Calculation (min) (ACUTE ONLY): 27 min  Assessment / Plan / Recommendation Clinical Impression  Pt seen for skilled intervention for potential diet upgrade d/t decreased satiety/poor PO intake with current diet of Dysphagia 1 (puree)/thin liquids via spoon.  Pt extremely verbose and inattentive which impacts overall swallow function, so mod verbal cues to pay attention while consuming food/liquids emphasized with pt/nurse during session with precaution sheet left in room.  Pt consumed thin via small cup sips/straw sips with min verbal cues re: safety/limiting volume to prevent overt s/s of aspiration seen during initial swallow eval (immediate cough).  Pt's vocal intensity/vocal quality improved this session.  Kyle Peck was able to consume thin, puree and soft solid without overt s/s of aspiration noted during session and improved overall mastication of solids.  Recommend upgrade to Dysphagia 3 (mechanical soft)/thin liquids via small sips/decrease environmental distractions, FULL precautions with self-feeding and slow rate d/t aspiration risk post-extubation on 10/31/20.  Pt in agreement with plan; no family present to discuss upgrade, but nursing present to confirm swallowing precautions/education.  ST will continue to f/u for diet tolerance and potential for speech/language evaluation prn.   HPI HPI: 60 y.o. male with medical history significant for tobacco use and occasional alcohol use who presents to the emergency department due to several days of leg and abdominal swelling.  Patient complained of increased leg swelling, testicular swelling and abdominal swelling which started a few days ago, he complained of occasional shortness of breath when he lays flat at night, but denies chest pain, fever, chills, abdominal pain.  Patient states  that he has not been to any doctors office in more than  20 years.      SLP Plan  Goals updated       Recommendations  Diet recommendations: Dysphagia 3 (mechanical soft);Thin liquid Liquids provided via: Cup;Straw (small sips) Medication Administration: Whole meds with puree Supervision: Full supervision/cueing for compensatory strategies;Patient able to self feed Compensations: Slow rate;Small sips/bites;Minimize environmental distractions                General recommendations: Other(comment) (TBD) Oral Care Recommendations: Oral care BID Follow up Recommendations: Skilled Nursing facility SLP Visit Diagnosis: Dysphagia, unspecified (R13.10) Plan: Goals updated                       Tressie Stalker, M.S., CCC-SLP 11/01/2020, 2:40 PM

## 2020-11-01 NOTE — Evaluation (Signed)
Physical Therapy Evaluation Patient Details Name: Kyle Peck MRN: [id number] DOB: 1960/12/01 Today's Date: 11/01/2020   History of Present Illness  60 y.o. male admitted 10/20/20 to [facility] with bil LE and abdominal edema; 5/22 pt developed agitation with need for Precedex then 5/23 periods of apnea requiring intubation and AKI with HD cath placed for initiation of CRRT, with pt transferred to [facility] for CHF evaluation and continued care.  5/27 CRRT stopped. Pt intubated from 10/25/20-10/31/20. PMhx: tobacco, alcohol, cocaine use  Clinical Impression  Pt hyperverbal with tolerance for gait with need for RW for stability. Pt with decreased safety, balance, gait and activity tolerance who will benefit from acute therapy to maximize mobility, safety and function to decrease burden of care. Pt encouraged to initiate walking program at home for CHF as well as maintain ambulation acutely with assist.   HR 115-120 SpO2 98-100% on RA    Follow Up Recommendations Supervision/Assistance - 24 hour;Outpatient PT    Equipment Recommendations  Rolling walker with 5" wheels    Recommendations for Other Services       Precautions / Restrictions Precautions Precautions: Fall      Mobility  Bed Mobility Overal bed mobility: Needs Assistance Bed Mobility: Supine to Sit     Supine to sit: Min guard     General bed mobility comments: guarding for lines with HOB 20 degrees    Transfers Overall transfer level: Needs assistance   Transfers: Sit to/from Stand Sit to Stand: Min guard         General transfer comment: cues for hand placement  Ambulation/Gait Ambulation/Gait assistance: Min guard Gait Distance (Feet): 300 Feet Assistive device: Rolling walker (2 wheeled) Gait Pattern/deviations: Step-through pattern;Decreased stride length;Trunk flexed   Gait velocity interpretation: 1.31 - 2.62 ft/sec, indicative of limited community ambulator General Gait Details: cues  for posture, stepping into RW and safety as pt frequently veering to right and running into obstacles x 4  Stairs            Wheelchair Mobility    Modified Rankin (Stroke Patients Only)       Balance Overall balance assessment: Needs assistance   Sitting balance-Leahy Scale: Good     Standing balance support: Bilateral upper extremity supported Standing balance-Leahy Scale: Poor Standing balance comment: RW for gait with cues for safety                             Pertinent Vitals/Pain Pain Assessment: No/denies pain    Home Living Family/patient expects to be discharged to:: Private residence Living Arrangements: Spouse/significant other Available Help at Discharge: Family;Available 24 hours/day Type of Home: House Home Access: Stairs to enter   [employer] of Steps: 2 Home Layout: One level Home Equipment: None      Prior Function Level of Independence: Independent               Hand Dominance        Extremity/Trunk Assessment   Upper Extremity Assessment Upper Extremity Assessment: Defer to OT evaluation    Lower Extremity Assessment Lower Extremity Assessment: Overall WFL for tasks assessed    Cervical / Trunk Assessment Cervical / Trunk Assessment: Kyphotic  Communication   Communication: No difficulties  Cognition Arousal/Alertness: Awake/alert Behavior During Therapy: Restless Overall Cognitive Status: Impaired/Different from baseline Area of Impairment: Memory;Orientation;Following commands;Safety/judgement                 Orientation  Level: Disoriented to;Time   Memory: Decreased short-term memory Following Commands: Follows one step commands consistently Safety/Judgement: Decreased awareness of deficits;Decreased awareness of safety     General Comments: pt hyperverbal with needs for cues for safety and direction throughout. Pt reports wife can provide 24hr assist      General Comments       Exercises     Assessment/Plan    PT Assessment Patient needs continued PT services  PT Problem List Decreased mobility;Decreased safety awareness;Decreased activity tolerance;Decreased balance;Decreased knowledge of use of DME       PT Treatment Interventions Gait training;Stair training;Functional mobility training;Therapeutic activities;Patient/family education;Cognitive remediation;Therapeutic exercise;DME instruction;Balance training    PT Goals (Current goals can be found in the Care Plan section)  Acute Rehab PT Goals Patient Stated Goal: return home PT Goal Formulation: With patient Time For Goal Achievement: 11/15/20 Potential to Achieve Goals: Fair    Frequency Min 3X/week   Barriers to discharge        Co-evaluation               AM-PAC PT "6 Clicks" Mobility  Outcome Measure Help needed turning from your back to your side while in a flat bed without using bedrails?: A Little Help needed moving from lying on your back to sitting on the side of a flat bed without using bedrails?: A Little Help needed moving to and from a bed to a chair (including a wheelchair)?: A Little Help needed standing up from a chair using your arms (e.g., wheelchair or bedside chair)?: A Little Help needed to walk in hospital room?: A Little Help needed climbing 3-5 steps with a railing? : A Lot 6 Click Score: 17    End of Session Equipment Utilized During Treatment: Gait belt Activity Tolerance: Patient tolerated treatment well Patient left: Other (comment) (standing at sink with OT end of session) Nurse Communication: Mobility status PT Visit Diagnosis: Other abnormalities of gait and mobility (R26.89);Difficulty in walking, not elsewhere classified (R26.2)    Time: 2426-8341 PT Time Calculation (min) (ACUTE ONLY): 15 min   Charges:   PT Evaluation $PT Eval Moderate Complexity: 1 Mod          Fedor Kazmierski P, PT Acute Rehabilitation Services Pager: [phone] Office:  [phone]   Judia Arnott B Carlita Whitcomb 11/01/2020, 11:21 AM

## 2020-11-01 NOTE — Evaluation (Signed)
Occupational Therapy Evaluation Patient Details Name: Kyle Peck MRN: [id number] DOB: 07-29-1960 Today's Date: 11/01/2020    History of Present Illness 60 y.o. male admitted 10/20/20 to [facility] with bil LE and abdominal edema; 5/22 pt developed agitation with need for Precedex then 5/23 periods of apnea requiring intubation and AKI with HD cath placed for initiation of CRRT, with pt transferred to [facility] for CHF evaluation and continued care.  5/27 CRRT stopped. Pt intubated from 10/25/20-10/31/20. PMhx: tobacco, alcohol, cocaine use   Clinical Impression   PTA, pt lives with spouse and reports Independence with ADLs and mobility without AD. Pt with deficits in standing balance, endurance and cognition. Pt hyperverbose and requires cues to redirect to functional tasks. Pt requires RW for safe mobility at min guard with cues for DME mgmt. Pt Setup for UB ADLs and min guard for LB ADLs due to deficits. Anticipate pt to progress well to PLOF with no need for OT follow-up. However, due to cognition, recommend 24/7 supervision at home. Will continue to follow acutely to maximize ADL safety/independence.   HR 120s with activity, SpO2 >90% on RA    Follow Up Recommendations  No OT follow up;Supervision/Assistance - 24 hour    Equipment Recommendations  Other (comment) (rolling walker)    Recommendations for Other Services       Precautions / Restrictions Precautions Precautions: Fall Restrictions Weight Bearing Restrictions: No      Mobility Bed Mobility Overal bed mobility: Needs Assistance Bed Mobility: Sit to Supine     Supine to sit: Min guard Sit to supine: Min guard   General bed mobility comments: min guard for line safety due to quick movements    Transfers Overall transfer level: Needs assistance Equipment used: Rolling walker (2 wheeled) Transfers: Sit to/from Stand Sit to Stand: Min guard         General transfer comment: min guard for safety     Balance Overall balance assessment: Needs assistance   Sitting balance-Leahy Scale: Good     Standing balance support: Bilateral upper extremity supported Standing balance-Leahy Scale: Poor Standing balance comment: RW for gait with cues for safety, unsteady when standing unsupported at sink                           ADL either performed or assessed with clinical judgement   ADL Overall ADL's : Needs assistance/impaired Eating/Feeding: Sitting;Set up   Grooming: Supervision/safety;Standing;Oral care;Wash/dry face Grooming Details (indicate cue type and reason): supervision for safety, minor cues for problem solving and to correct posture due to leaning forward at sink Upper Body Bathing: Set up;Sitting   Lower Body Bathing: Min guard;Sit to/from stand   Upper Body Dressing : Set up;Sitting   Lower Body Dressing: Min guard;Sit to/from stand Lower Body Dressing Details (indicate cue type and reason): pt able to bring feet to self to pull up socks, min guard in standing due to unsteadiness at times Toilet Transfer: Min guard;Cueing for sequencing;Cueing for safety;Ambulation;RW   Toileting- Architect and Hygiene: Min guard;Sit to/from stand       Functional mobility during ADLs: Min guard;Rolling walker;Cueing for sequencing;Cueing for safety General ADL Comments: Standing balance deficits requiring use of RW for safe mobility, cognitive issues impacting safety/attention to tasks and questionable fine motor coordination vs visual deficits as pt had great difficulty locating/pushing button to adjust HOB     Vision Baseline Vision/History: Wears glasses Wears Glasses: Reading only Patient Visual  Report: No change from baseline Vision Assessment?: Vision impaired- to be further tested in functional context Additional Comments: fine motor coordination vs cognition vs visual deficits? pt with difficulty locating and pushing HOB button - requiring therapist to  physically place finger on button     Perception     Praxis      Pertinent Vitals/Pain Pain Assessment: Faces Faces Pain Scale: Hurts a little bit Pain Location: bottom where sore is Pain Descriptors / Indicators: Sore Pain Intervention(s): Monitored during session;Repositioned     Hand Dominance Right   Extremity/Trunk Assessment Upper Extremity Assessment Upper Extremity Assessment: Overall WFL for tasks assessed   Lower Extremity Assessment Lower Extremity Assessment: Defer to PT evaluation   Cervical / Trunk Assessment Cervical / Trunk Assessment: Kyphotic   Communication Communication Communication: No difficulties   Cognition Arousal/Alertness: Awake/alert Behavior During Therapy: Restless Overall Cognitive Status: Impaired/Different from baseline Area of Impairment: Memory;Orientation;Following commands;Safety/judgement;Attention                 Orientation Level: Disoriented to;Situation Current Attention Level: Selective Memory: Decreased short-term memory Following Commands: Follows one step commands consistently Safety/Judgement: Decreased awareness of deficits;Decreased awareness of safety     General Comments: pt hyperverbal with needs for cues for safety and redirection throughout. some confusion and requires cues for problem solving   General Comments  HR 120s with activity, O2 WFL on RA    Exercises     Shoulder Instructions      Home Living Family/patient expects to be discharged to:: Private residence Living Arrangements: Spouse/significant other Available Help at Discharge: Family;Available 24 hours/day Type of Home: House Home Access: Stairs to enter [employer] of Steps: 2   Home Layout: One level     Bathroom Shower/Tub: Walk-in shower;Tub/shower unit   Bathroom Toilet: Handicapped height     Home Equipment: None          Prior Functioning/Environment Level of Independence: Independent                  OT Problem List: Decreased activity tolerance;Impaired balance (sitting and/or standing);Decreased cognition;Decreased safety awareness;Decreased knowledge of use of DME or AE      OT Treatment/Interventions: Self-care/ADL training;Therapeutic exercise;DME and/or AE instruction;Therapeutic activities;Patient/family education;Balance training    OT Goals(Current goals can be found in the care plan section) Acute Rehab OT Goals Patient Stated Goal: return home OT Goal Formulation: With patient Time For Goal Achievement: 11/15/20 Potential to Achieve Goals: Good ADL Goals Pt Will Perform Grooming: with modified independence;standing Pt Will Perform Lower Body Dressing: with modified independence;sit to/from stand;sitting/lateral leans Pt Will Transfer to Toilet: with modified independence;ambulating;regular height toilet Additional ADL Goal #1: Pt to attend to functional task > 5 min with min verbal cues  OT Frequency: Min 2X/week   Barriers to D/C:            Co-evaluation              AM-PAC OT "6 Clicks" Daily Activity     Outcome Measure Help from another person eating meals?: A Little Help from another person taking care of personal grooming?: A Little Help from another person toileting, which includes using toliet, bedpan, or urinal?: A Little Help from another person bathing (including washing, rinsing, drying)?: A Little Help from another person to put on and taking off regular upper body clothing?: A Little Help from another person to put on and taking off regular lower body clothing?: A Little 6 Click Score: 18  End of Session Equipment Utilized During Treatment: Gait belt;Rolling walker Nurse Communication: Mobility status  Activity Tolerance: Patient tolerated treatment well Patient left: in bed;with call bell/phone within reach;with bed alarm set  OT Visit Diagnosis: Other abnormalities of gait and mobility (R26.89);Unsteadiness on feet (R26.81);Other  symptoms and signs involving cognitive function                Time: 8937-3428 OT Time Calculation (min): 18 min Charges:  OT General Charges $OT Visit: 1 Visit OT Evaluation $OT Eval Moderate Complexity: 1 Mod  Bradd Canary, OTR/L Acute Rehab Services Office: [phone]  Lorre Munroe 11/01/2020, 12:03 PM

## 2020-11-01 NOTE — Progress Notes (Addendum)
Patient ID: Kyle Peck, male   DOB: 03-07-1961, 60 y.o.   MRN: [id number]     Advanced Heart Failure Rounding Note  PCP-Cardiologist: None   Subjective:    Transferred to [facility] from [facility] for shock treatment.  MRSA PNA/bacteremia, on vancomycin.  TEE 5/25: EF 20%, mild RV dysfunction, no endocarditis.  Extubated yesterday. On DBA 1. CO-ox 62%. CVP 7-8  Walking unit. Conversant. Denies CP or SOB   Objective:   Weight Range: 85.3 kg Body mass index is 29.45 kg/m.   Vital Signs:   Temp:  [97.8 F (36.6 C)-98.5 F (36.9 C)] 98.5 F (36.9 C) (05/30 0809) Pulse Rate:  [85-105] 92 (05/30 0700) Resp:  [14-30] 16 (05/30 0700) BP: (80-112)/(35-93) 88/64 (05/30 0700) SpO2:  [94 %-100 %] 100 % (05/30 0700) Last BM Date: 10/31/20  Weight change: Filed Weights   10/29/20 0423 10/30/20 0149 10/31/20 0615  Weight: 89.3 kg 89.2 kg 85.3 kg    Intake/Output:   Intake/Output Summary (Last 24 hours) at 11/01/2020 0832 Last data filed at 11/01/2020 0700 Gross per 24 hour  Intake 1456.25 ml  Output 3475 ml  Net -2018.75 ml      Physical Exam   General:  Sitting up in bed. No resp difficulty HEENT: normal Neck: supple. no JVD. Carotids 2+ bilat; no bruits. No lymphadenopathy or thryomegaly appreciated. Cor: PMI nondisplaced. Regular rate & rhythm. No rubs, gallops or murmurs. Lungs: clear Abdomen: soft, nontender, nondistended. No hepatosplenomegaly. No bruits or masses. Good bowel sounds. Extremities: no cyanosis, clubbing, rash, edema Neuro: alert & orientedx3, cranial nerves grossly intact. moves all 4 extremities w/o difficulty. Affect pleasant  Telemetry   Sinus 90-100 Personally reviewed   Labs    CBC No results for input(s): WBC, NEUTROABS, HGB, HCT, MCV, PLT in the last 72 hours. Basic Metabolic Panel Recent Labs    78/29/56 0326 11/01/20 0335  NA 136 138  K 3.9 3.3*  CL 99 98  CO2 32 34*  GLUCOSE 137* 77  BUN 26* 19  CREATININE 0.56* 0.68  CALCIUM 8.1* 8.3*   MG 1.9 1.7  PHOS 4.8* 3.9   Liver Function Tests Recent Labs    10/31/20 0326 11/01/20 0335  ALBUMIN 2.4* 2.6*   No results for input(s): LIPASE, AMYLASE in the last 72 hours. Cardiac Enzymes No results for input(s): CKTOTAL, CKMB, CKMBINDEX, TROPONINI in the last 72 hours.  BNP: BNP (last 3 results) Recent Labs    10/21/20 0033 10/23/20 0504  BNP 2,252.0* 1,685.0*    ProBNP (last 3 results) No results for input(s): PROBNP in the last 8760 hours.   D-Dimer No results for input(s): DDIMER in the last 72 hours. Hemoglobin A1C No results for input(s): HGBA1C in the last 72 hours. Fasting Lipid Panel No results for input(s): CHOL, HDL, LDLCALC, TRIG, CHOLHDL, LDLDIRECT in the last 72 hours. Thyroid Function Tests No results for input(s): TSH, T4TOTAL, T3FREE, THYROIDAB in the last 72 hours.  Invalid input(s): FREET3  Other results:   Imaging    No results found.   Medications:     Scheduled Medications: . chlordiazePOXIDE  10 mg Oral TID  . Chlorhexidine Gluconate Cloth  6 each Topical Daily  . digoxin  0.125 mg Oral Daily  . docusate sodium  100 mg Oral BID  . enoxaparin (LOVENOX) injection  40 mg Subcutaneous Q24H  . folic acid  1 mg Oral Daily  . furosemide  40 mg Oral Daily  . mouth rinse  15 mL Mouth Rinse  BID  . pantoprazole (PROTONIX) IV  40 mg Intravenous Q24H  . polyethylene glycol  17 g Oral BID  . potassium chloride  40 mEq Oral BID  . QUEtiapine  100 mg Oral BID  . sodium chloride flush  10-40 mL Intracatheter Q12H  . [START ON 11/02/2020] sodium chloride flush  3 mL Intravenous Q12H  . spironolactone  12.5 mg Oral Daily  . tamsulosin  0.4 mg Oral Daily  . temazepam  30 mg Oral QHS  . thiamine  100 mg Oral Daily   Or  . thiamine  100 mg Intravenous Daily    Infusions: . sodium chloride 10 mL/hr at 11/01/20 0700  . DOBUTamine 1 mcg/kg/min (11/01/20 0700)  . magnesium sulfate bolus IVPB    . vancomycin Stopped (11/01/20 0440)     PRN Medications: acetaminophen (TYLENOL) oral liquid 160 mg/5 mL, diphenhydrAMINE, haloperidol lactate, ondansetron (ZOFRAN) IV, sodium chloride flush     Assessment/Plan   1. Shock: Suspect mixed cardiogenic and septic with MRSA on blood cultures.  - Off NE, now on dobutamine 1  Co-ox 62%. Stop DBA 2. Acute systolic CHF: Echo this admission with EF 15%, moderate LV dilation, mildly decreased RV function, mild MR. Cause uncertain, could be due to ETOH abuse and cocaine but cannot rule out CAD. Presentation consistent with CHF, not ACS.  HS-TnI in setting of hypotension/shock was 204 => 141. CVP 7-8 today. Now off CVVH (stopped 5/26). Co-ox 62% on DBA 1 - Stop DBA today - Off CVVHD. Volume status looks good.Has been getting lasix 60 IV daily. Switch to lasix 40 daily  - Continue spiro 12.5.  - Continue dig - Consider ARNI/ARB post cath if BP tolerates.  - Plan right/left cath tomorrow. D/w him and he agrees  3. AKI: Creatinine up to 2.09 with persistent hyperkalemia and minimal UOP. Cr now stable 0.6. Now off CVVH and making urine, CVP 7-8 today.   - Renal has signed off 4. ETOH abuse with withdrawal.  - CCM managing 5. Cocaine abuse: likely contributing to WD 6. Acute hypoxemic respiratory failure: Initially in setting of ETOH withdrawal and altered mental status.  - Extubated this am  - D/w Dr. Tonia Brooms 7. ID: MRSA in blood, ?PNA source.  PCT 1.5 => 1.04.  TEE 5/25 showed no endocarditis.  - Continue vancomycin 8. NSVT - keep K> 4.0 Mg > 2.0 - supp K  Arvilla Meres, MD 11/01/2020 8:32 AM

## 2020-11-01 NOTE — Progress Notes (Signed)
[name], MRN:  [id number], DOB:  04/21/1961, LOS: 11 ADMISSION DATE:  10/20/2020, CONSULTATION DATE:  11/01/2020  REFERRING MD:  Kyle Peck, CHIEF COMPLAINT: Hypotension, post intubation  History of Present Illness:  60 year old EtOH and cocaine user admitted 5/18 with leg and abdominal swelling.  Echo showed EF of 15% with global hypokinesis, cardiology consultation advised cardiac cath after adequate medical management.  He was treated for alcohol withdrawal with Ativan and on 5/22 transferred to ICU and placed on Precedex but eventually required intubation .  He subsequently became hypotensive and required increasing Levophed and vasopressin through the night.  On 5/23, he was noted to have AKI & hyperkalemia, PCCM consulted  Pertinent  Medical History  ETOH use Marijuana and cocaine use UDS + cocaine   Significant Hospital Events: Including procedures, antibiotic start and stop dates in addition to other pertinent events    5/18 Admitted to [facility]  5/23 Intubated and HD cath placed for initiation of CRRT, transferred to [facility] for CHF evaluation and continued care   5/26 vent, pressors, cvvhd, high sedation requirements   5/27 CVVHD stopped, Foley pulled  5/28 urinary retention, Foley replaced  5/28 evening, self extubated and reintubated  5/29 extubated on precedex   Interim History / Subjective:  Tolerated extubation. Did not sleep well due to anxiety/anxiousness. Precedex does not seem to make a difference with this.  Objective   Blood pressure (!) 88/64, pulse 92, temperature 97.8 F (36.6 C), temperature source Axillary, resp. rate 16, height 5\' 7"  (1.702 m), weight 85.3 kg, SpO2 100 %. CVP:  [4 mmHg-12 mmHg] 9 mmHg  Vent Mode: PSV;CPAP FiO2 (%):  [30 %] 30 % PEEP:  [5 cmH20] 5 cmH20 Pressure Support:  [5 cmH20] 5 cmH20   Intake/Output Summary (Last 24 hours) at 11/01/2020 0709 Last data filed at 11/01/2020 0700 Gross per 24 hour  Intake 1645.33 ml   Output 3625 ml  Net -1979.67 ml   Filed Weights   10/29/20 0423 10/30/20 0149 10/31/20 0615  Weight: 89.3 kg 89.2 kg 85.3 kg    Examination: Constitutional: no acute distress  Eyes: EOMI, pupils reactive Ears, nose, mouth, and throat: MMM, trachea midline Cardiovascular: RRR, ext warm Respiratory: clear, no wheezing or accessory muscle use Gastrointestinal: soft, +BS Skin: No rashes, normal turgor Neurologic: moves all 4 ext to command Psychiatric: tangential pressured speech, oriented   Labs/imaging that I have personally reviewed    5/19 ECHO with EF 15% with global hypokinesis   Coox 62% Mag/K low, replete Net neg 2L 24h, -14L admission  Resolved Hospital Problem list   Acute kidney injury, acute renal failure requiring CVVHD, CVVHD stopped Hyperkalemia, resolved  Assessment & Plan:   Septic shock due to MRSA bacteremia; question MRSA pneumonia- TTE and TEE neg Cardiogenic shock- question of EtOH cardiomyopathy; coox 62% -Blood cultures positive for MRSA 5/24. Patient spouse denies any known IV drug P: -Dobutamine wean and lasix per CHF team. -Possible right left heart cath tomorrow  -Vanc duration TBD, ID following up tomorrow  Acute Hypoxic Respiratory Failure  -In the setting acute CHF and severe ETOH withdrawal -Extubated 5/29 P: - Incentive spirometry, ambulation  Acute encephalopathy/agitated delirium Polysubstance abuse EtOH abuse -UDS positive for cocaine, benzodiazepine, and THC -In the setting of acute ETOH withdrawal and acute critical illness  P: - Wean precedex, continue seroquel and librium at current dosing - Start qHS restoril  Elevated LFTs -Felt secondary to cardiogenic shock with known history of EtOH abuse  P: - Monitor  Mobility: PT OT help appreciated  Best practice   Diet: PO Pain/Anxiety/Delirium protocol (if indicated): PRNs VAP protocol (if indicated): off DVT prophylaxis: Subcutaneous Heparin GI prophylaxis:  PPI Glucose control:  SSI Yes Central venous access: removal per CHF team once they do not need further SvO2 Arterial line:  N/A Foley:  remove Mobility:  bed rest  PT consulted: Yes Last date of multidisciplinary goals of care discussion updated patient Code Status:  full code Disposition: ICU pending dobutamine and precedex wean   Patient critically ill due to metabolic encephalopathy, cardiogenic shock Interventions to address this today weaning dobutamine and precedex Risk of deterioration without these interventions is high  I personally spent 33 minutes providing critical care not including any separately billable procedures  Kyle Halsted MD [facility]  Prefer epic messenger for cross cover needs If after hours, please call E-link

## 2020-11-01 NOTE — TOC Progression Note (Signed)
Transition of Care (TOC) - Progression Note  Heart Failure  Patient Details  Name: Kyle Peck MRN: [id number] Date of Birth: October 02, 1960  Transition of Care [facility]) CM/SW Contact  Der Gagliano, LCSWA Phone Number: 11/01/2020, 1:55 PM  Clinical Narrative:    CSW spoke with the patient at bedside and completed a very brief CAGE Aid and SDOH screening with the patient who reported using alcohol and THC two weeks ago and cocaine use several months ago. Kyle Peck ventilated about his work situation and substance use and became tearful talking about his wife and his financial hardship. Kyle Peck was agreeable for the CSW to provide the patient with substance use resources. Kyle Peck reported needing help with his house payments and CSW will provide the patient with rental/utility assistance resources. CSW provided the patient with a CAFA application as Kyle Peck has no health insurance as he is reportedly self-employed as a Administrator. Kyle Peck reported that his wife will be by later this afternoon and needing to use the restroom and needing a male urinal and to please get his nurse for assistance.  TOC will continue to follow for discharge needs.    Expected Discharge Plan: Home/Self Care Barriers to Discharge: Continued Medical Work up  Expected Discharge Plan and Services Expected Discharge Plan: Home/Self Care In-house Referral: Clinical Social Work     Living arrangements for the past 2 months: Single Family Home                                       Social Determinants of Health (SDOH) Interventions Food Insecurity Interventions: Other (Comment) (Patients wife reports receiving Food Stamps) Financial Strain Interventions: Other (Comment),Financial Counselor (Provided patient with CAFA application and will provide the patient with rental assistance resources) Housing Interventions: Other (Comment) ([facility] outpatient clinic provided support for water bill referral to  rental assistance.) Transportation Interventions: Intervention Not Indicated  Readmission Risk Interventions No flowsheet data found.  Mirielle Byrum, MSW, LCSWA [phone] Heart Failure Social Worker

## 2020-11-01 NOTE — Progress Notes (Addendum)
Notified for 6bt NSVT, asymptomatic. Chart reviewed. NSVT previously noted before with recommendation to maintain K >4, Mg >2. This AM's labs showed K 3.3 and Mg 1.7 -> pt received KCl (with plan for another this evening) and 4g mag sulfate. Cr normal. Will add stat BMET/Mg. Requested nurse notify cardiology when these have resulted.

## 2020-11-01 NOTE — Consult Note (Signed)
WOC Nurse wound follow up: Patient receiving care in [facility] 2H04 Wound type: Evolving DTPI  Pressure Injury POA: No Wound bed: Purple/maroon on the left buttock with kissing lesions in the intergluteal cleft, yellow in color. Drainage (amount, consistency, odor) None on foam dressing Periwound: Intact Dressing procedure/placement/frequency: Place Xeroform gauze in the intergluteal cleft allowing the gauze to cover the left side of the buttock. Cover with foam dressing. Change the Xeroform gauze daily.  WOC will follow weekly.  Monitor the wound area(s) for worsening of condition such as: Signs/symptoms of infection, increase in size, development of or worsening of odor, development of pain, or increased pain at the affected locations.   Notify the medical team if any of these develop.  Thank you for the consult. Please re-consult the WOC team if needed.  Renaldo Reel Katrinka Blazing, MSN, RN, CMSRN, Angus Seller, [facility] Wound Treatment Associate Pager [phone]

## 2020-11-02 ENCOUNTER — Inpatient Hospital Stay (HOSPITAL_COMMUNITY): Payer: Self-pay

## 2020-11-02 ENCOUNTER — Encounter (HOSPITAL_COMMUNITY): Payer: Self-pay | Admitting: *Deleted

## 2020-11-02 DIAGNOSIS — B9562 Methicillin resistant Staphylococcus aureus infection as the cause of diseases classified elsewhere: Secondary | ICD-10-CM

## 2020-11-02 DIAGNOSIS — R7881 Bacteremia: Secondary | ICD-10-CM

## 2020-11-02 DIAGNOSIS — I5043 Acute on chronic combined systolic (congestive) and diastolic (congestive) heart failure: Secondary | ICD-10-CM

## 2020-11-02 DIAGNOSIS — F1010 Alcohol abuse, uncomplicated: Secondary | ICD-10-CM

## 2020-11-02 LAB — VITAMIN B12: Vitamin B-12: 476 pg/mL (ref 180–914)

## 2020-11-02 LAB — BASIC METABOLIC PANEL
Anion gap: 9 (ref 5–15)
BUN: 13 mg/dL (ref 6–20)
CO2: 28 mmol/L (ref 22–32)
Calcium: 8.1 mg/dL — ABNORMAL LOW (ref 8.9–10.3)
Chloride: 99 mmol/L (ref 98–111)
Creatinine, Ser: 0.67 mg/dL (ref 0.61–1.24)
GFR, Estimated: >60 mL/min (ref >60)
Glucose, Bld: 88 mg/dL (ref 70–99)
Potassium: 4.1 mmol/L (ref 3.5–5.1)
Sodium: 136 mmol/L (ref 135–145)

## 2020-11-02 LAB — CBC
HCT: 38.1 % — ABNORMAL LOW (ref 39.0–52.0)
Hemoglobin: 12.1 g/dL — ABNORMAL LOW (ref 13.0–17.0)
MCH: 27.3 pg (ref 26.0–34.0)
MCHC: 31.8 g/dL (ref 30.0–36.0)
MCV: 86 fL (ref 80.0–100.0)
Platelets: 298 10*3/uL (ref 150–400)
RBC: 4.43 MIL/uL (ref 4.22–5.81)
RDW: 16.4 % — ABNORMAL HIGH (ref 11.5–15.5)
WBC: 7.6 10*3/uL (ref 4.0–10.5)
nRBC: 0 % (ref 0.0–0.2)

## 2020-11-02 LAB — HEPATIC FUNCTION PANEL
ALT: 105 U/L — ABNORMAL HIGH (ref 0–44)
AST: 30 U/L (ref 15–41)
Albumin: 2.7 g/dL — ABNORMAL LOW (ref 3.5–5.0)
Alkaline Phosphatase: 64 U/L (ref 38–126)
Bilirubin, Direct: 0.6 mg/dL — ABNORMAL HIGH (ref 0.0–0.2)
Indirect Bilirubin: 1.2 mg/dL — ABNORMAL HIGH (ref 0.3–0.9)
Total Bilirubin: 1.8 mg/dL — ABNORMAL HIGH (ref 0.3–1.2)
Total Protein: 5.8 g/dL — ABNORMAL LOW (ref 6.5–8.1)

## 2020-11-02 LAB — COOXEMETRY PANEL
Carboxyhemoglobin: 1.9 % — ABNORMAL HIGH (ref 0.5–1.5)
Methemoglobin: 0.7 % (ref 0.0–1.5)
O2 Saturation: 75.4 %
Total hemoglobin: 11.5 g/dL — ABNORMAL LOW (ref 12.0–16.0)

## 2020-11-02 LAB — VANCOMYCIN, PEAK: Vancomycin Pk: 38 ug/mL (ref 30–40)

## 2020-11-02 LAB — PHOSPHORUS: Phosphorus: 3.7 mg/dL (ref 2.5–4.6)

## 2020-11-02 LAB — VANCOMYCIN, TROUGH: Vancomycin Tr: 22 ug/mL (ref 15–20)

## 2020-11-02 LAB — MAGNESIUM: Magnesium: 2 mg/dL (ref 1.7–2.4)

## 2020-11-02 LAB — GLUCOSE, CAPILLARY: Glucose-Capillary: 89 mg/dL (ref 70–99)

## 2020-11-02 MED ORDER — IOHEXOL 350 MG/ML SOLN
100.0000 mL | Freq: Once | INTRAVENOUS | Status: AC | PRN
  Administered 2020-11-02: 100 mL via INTRAVENOUS

## 2020-11-02 MED ORDER — LORAZEPAM 2 MG/ML IJ SOLN: 1.0000 mg | INTRAMUSCULAR | Status: DC | PRN

## 2020-11-02 MED ORDER — LORAZEPAM 1 MG PO TABS: 1.0000 mg | ORAL_TABLET | ORAL | Status: DC | PRN

## 2020-11-02 MED ORDER — ADULT MULTIVITAMIN W/MINERALS CH
1.0000 | ORAL_TABLET | Freq: Every day | ORAL | Status: DC
  Administered 2020-11-03 – 2020-11-04 (×2): 1 via ORAL
  Filled 2020-11-02 (×2): qty 1

## 2020-11-02 MED ORDER — ENSURE ENLIVE PO LIQD
237.0000 mL | Freq: Three times a day (TID) | ORAL | Status: DC
  Administered 2020-11-03 – 2020-11-04 (×3): 237 mL via ORAL

## 2020-11-02 MED ORDER — LORAZEPAM 2 MG/ML IJ SOLN
INTRAMUSCULAR | Status: AC
  Administered 2020-11-02: 2 mg
  Filled 2020-11-02: qty 1

## 2020-11-02 NOTE — Progress Notes (Signed)
EEG complete - results pending 

## 2020-11-02 NOTE — Code Documentation (Addendum)
Pt is a 60 yr old male pt awaiting Cath today with Dr Marigene Ehlers, when PA noted he could not speak normally. In-pt code stroke was activated at 0748. Stroke team met pt in [place] [facility] Cath lab. He was awake, and alert, cooperative, but could only speak in stuttering, grunting syllables. We confirmed that this was abnormal for him, and his nurse on 6 E confirms that his speech was fluent at [place] this AM. Pt taken for urgent CT head. CTH without hemorrhage per Dr Curly Shores.CTA/P obtained to R/O LVO as pt aphasic. These tests were negative per Dr Curly Shores. Pt taken urgently to MRI to determine suitability for TPA. Limited MRI negative for ischemia per Dr Curly Shores.No TPA as negative for stroke. No NIR as negative for stroke. Code stroke cancelled at 0835. Pt returned to [place] 27 by RRT. Please see code stroke timeline for specific times and full NIHSS.

## 2020-11-02 NOTE — Progress Notes (Signed)
PROGRESS NOTE    Kyle Peck  CXK:[id number] DOB: [birth year] DOA: 10/20/2020 PCP: Pcp, No    Chief Complaint  Patient presents with  . Joint Swelling    Brief Narrative:   60 year old EtOH and cocaine user initially admitted to [facility] on 10/21/2019 for abdominal swelling and altered mental status..  Echocardiogram showed left ventricular ejection fraction of 15% with global hypokinesis.  He was treated for alcohol withdrawal with Ativan and transferred to ICU and placed on Precedex, eventually required intubation.  He subsequently became hypotensive requiring vasopressors.  He also developed AKI and hyperkalemia and he was on [facility] service.  He was extubated on 10/31/2020 and weaned off Precedex on 11/01/2020 and transferred to [facility] on 11/02/2020. Patient was scheduled for cardiac catheterization today but had to be aborted as patient became confused.  Assessment & Plan:   Principal Problem:   Elevated brain natriuretic peptide (BNP) level Active Problems:   CHF (congestive heart failure) (HCC)   Transaminitis   Thrombocytosis   Hypoalbuminemia due to protein-calorie malnutrition (HCC)   Tobacco use   Pressure injury of skin   Acute respiratory failure (HCC)   Cardiogenic shock (HCC)   Hyperkalemia   Acute renal failure (ARF) (HCC)   Acute encephalopathy   Polysubstance abuse ([place])   ETOH abuse   MRSA bacteremia   Cocaine abuse ([place])   Septic shock secondary to MRSA bacteremia/MRSA pneumonia TTE and TEE are negative. Cardiogenic shock in the setting of EtOH abuse/cardiomyopathy Patient was weaned off vasopressors on 10/31/2020 and extubated on 10/31/2020.  Duration of IV vancomycin for MRSA bacteremia as per ID.    Acute respiratory failure with hypoxia in the setting of acute systolic heart failure and severe EtOH withdrawal. Extubated on 10/31/2020.   Acute metabolic encephalopathy in the setting of polysubstance abuse and EtOH abuse UDS is positive for cocaine  benzodiazepines and THC on admission Patient has been on Precedex and intubated, extubated on 5/29. Continue with Seroquel and Librium at current dosing and Restoril at bedtime Continue with IV Haldol as needed for agitation. Vitamin B12 and RPR ordered by neurology  Pressure injury buttocks present on admission Pressure Injury 10/25/20 Buttocks Bilateral;Mid Deep Tissue Pressure Injury - Purple or maroon localized area of discolored intact skin or blood-filled blister due to damage of underlying soft tissue from pressure and/or shear. (Active)  10/25/20 1715  Location: Buttocks  Location Orientation: Bilateral;Mid  Staging: Deep Tissue Pressure Injury - Purple or maroon localized area of discolored intact skin or blood-filled blister due to damage of underlying soft tissue from pressure and/or shear.  Wound Description (Comments):   Present on Admission: Yes     Pressure Injury 10/25/20 Buttocks Left Stage 2 -  Partial thickness loss of dermis presenting as a shallow open injury with a red, pink wound bed without slough. (Active)  10/25/20 1715  Location: Buttocks  Location Orientation: Left  Staging: Stage 2 -  Partial thickness loss of dermis presenting as a shallow open injury with a red, pink wound bed without slough.  Wound Description (Comments):   Present on Admission: Yes   Wound care consulted and appreciate recommendations.    AKI Probably secondary to septic shock, which has resolved.    Delirium this morning initially thought to be code stroke CT of the head and MRI of the brain and negative for acute stroke. Patient continues to be confused probably ICU delirium. Patient weaned off Precedex yesterday. Currently on as needed Ativan and IV Haldol in addition  to Seroquel and Librium and Restoril. Neurology on board, awaiting recommendations.    Polysubstance abuse/EtOH abuse TOC consulted for recommendations.  And resources    Transaminitis AST is within  normal limits ALT is 105 total bilirubin elevated at 1.8, alk phos is 64.  Continue to follow.    DVT prophylaxis: (Lovenox) Code Status: (Full code.  Family Communication: family at bedside.  Disposition:   Status is: Inpatient  Remains inpatient appropriate because:IV treatments appropriate due to intensity of illness or inability to take PO   Dispo: The patient is from: Home              Anticipated d/c is to: Home              Patient currently is not medically stable to d/c.   Difficult to place patient No       Consultants:   Cardiology.    Procedures: none.   Antimicrobials:  Antibiotics Given (last 72 hours)    Date/Time Action Medication Dose Rate   10/30/20 2200 New Bag/Given   vancomycin (VANCOREADY) IVPB 750 mg/150 mL 750 mg 150 mL/hr   10/31/20 1107 New Bag/Given   vancomycin (VANCOREADY) IVPB 1250 mg/250 mL 1,250 mg 166.7 mL/hr   10/31/20 1818 New Bag/Given   vancomycin (VANCOREADY) IVPB 1250 mg/250 mL 1,250 mg 166.7 mL/hr   11/01/20 0307 New Bag/Given   vancomycin (VANCOREADY) IVPB 1250 mg/250 mL 1,250 mg 166.7 mL/hr   11/01/20 1208 New Bag/Given   vancomycin (VANCOREADY) IVPB 1250 mg/250 mL 1,250 mg 166.7 mL/hr   11/01/20 2110 New Bag/Given   vancomycin (VANCOREADY) IVPB 1250 mg/250 mL 1,250 mg 166.7 mL/hr   11/02/20 0340 New Bag/Given   vancomycin (VANCOREADY) IVPB 1250 mg/250 mL 1,250 mg 166.7 mL/hr   11/02/20 1257 New Bag/Given   vancomycin (VANCOREADY) IVPB 1250 mg/250 mL 1,250 mg 166.7 mL/hr        Subjective: Agitated,   Objective: Vitals:   11/01/20 2022 11/01/20 2351 11/02/20 0321 11/02/20 0727  BP: 130/85 113/74 119/84   Pulse: (!) 125 (!) 119 (!) 121   Resp: _0 Temp: 98.4 F (36.9 C) 97.9 F (36.6 C) 98 F (36.7 C)   TempSrc: Oral Oral Oral   SpO2:  91% 98% 98%  Weight:   83.9 kg   Height:        Intake/Output Summary (Last 24 hours) at 11/02/2020 1514 Last data filed at 11/02/2020 0340 Gross per 24 hour   Intake 260 ml  Output 1 ml  Net 259 ml   Filed Weights   10/30/20 0149 10/31/20 0615 11/02/20 0321  Weight: 89.2 kg 85.3 kg 83.9 kg    Examination:  General exam: confused,  Respiratory system: Clear to auscultation. Respiratory effort normal. Cardiovascular system: S1 & S2 heard, RRR. No JVD,. No pedal edema. Gastrointestinal system: Abdomen is nondistended, soft and nontender.  Normal bowel sounds heard. Central nervous system: alert and confused.  Extremities: Symmetric 5 x 5 power. Skin: stge 2 sacral decubitus ulcer.  Psychiatry:  Mood & affect appropriate.     Data Reviewed: I have personally reviewed following labs and imaging studies  CBC: Recent Labs  Lab 10/28/20 0445 11/02/20 0623  WBC 8.4 7.6  HGB 10.8* 12.1*  HCT 36.6* 38.1*  MCV 92.0 86.0  PLT 214 921    Basic Metabolic Panel: Recent Labs  Lab 10/29/20 0442 10/30/20 0310 10/31/20 0326 11/01/20 0335 11/01/20 1849 11/02/20 0623  NA 138 139 136 138 134*  136  K 4.2 3.5 3.9 3.3* 3.8 4.1  CL 102 97* 99 98 98 99  CO2 31 33* 32 34* 30 28  GLUCOSE 138* 133* 137* 77 120* 88  BUN 29* 27* 26* _0 CREATININE 0.93 0.73 0.56* 0.68 0.75 0.67  CALCIUM 8.2* 8.1* 8.1* 8.3* 8.0* 8.1*  MG 2.0 1.8 1.9 1.7 2.3 2.0  PHOS 4.3 3.8 4.8* 3.9  --  3.7    GFR: Estimated Creatinine Clearance: 101.7 mL/min (by C-G formula based on SCr of 0.67 mg/dL).  Liver Function Tests: Recent Labs  Lab 10/27/20 0414 10/27/20 1606 10/29/20 0442 10/30/20 0310 10/31/20 0326 11/01/20 0335 11/02/20 0623  AST 301*  --   --   --   --   --  30  ALT 388*  --   --   --   --   --  105*  ALKPHOS 56  --   --   --   --   --  64  BILITOT 1.2  --   --   --   --   --  1.8*  PROT 5.5*  --   --   --   --   --  5.8*  ALBUMIN 2.7*   < > 2.8* 2.5* 2.4* 2.6* 2.7*   < > = values in this interval not displayed.    CBG: Recent Labs  Lab 10/31/20 0718 10/31/20 1135 11/01/20 0727 11/01/20 1126 11/02/20 0747  GLUCAP 121* 117* 85  115* 89     Recent Results (from the past 240 hour(s))  MRSA PCR Screening     Status: Abnormal   Collection Time: 10/25/20  6:30 AM   Specimen: Nasal Mucosa; Nasopharyngeal  Result Value Ref Range Status   MRSA by PCR POSITIVE (A) NEGATIVE Final    Comment:        The GeneXpert MRSA Assay (FDA approved for NASAL specimens only), is one component of a comprehensive MRSA colonization surveillance program. It is not intended to diagnose MRSA infection nor to guide or monitor treatment for MRSA infections. RESULT CALLED TO, READ BACK BY AND VERIFIED WITH: FOLEY,B. AT 0845 BY HUFFINES,S ON 10/25/20. Performed at [facility], [address], [place], [place] [address]   Culture, blood (routine x 2)     Status: Abnormal   Collection Time: 10/25/20  8:06 AM   Specimen: BLOOD  Result Value Ref Range Status   Specimen Description   Final    BLOOD RIGHT ANTECUBITAL Performed at [facility], [address] [place], [place] [address]    Special Requests   Final    BOTTLES DRAWN AEROBIC AND ANAEROBIC Blood Culture adequate volume Performed at [facility], [address], [place], [place] [address]    Culture  Setup Time   Final    AEROBIC BOTTLE ONLY GRAM POSITIVE COCCI Gram Stain Report Called to,Read Back By and Verified With: W COUNCIL,RN_1  10/26/20 MKELLY CRITICAL VALUE NOTED.  VALUE IS CONSISTENT WITH PREVIOUSLY REPORTED AND CALLED VALUE.    Culture (A)  Final    STAPHYLOCOCCUS AUREUS SUSCEPTIBILITIES PERFORMED ON PREVIOUS CULTURE WITHIN THE LAST 5 DAYS. Performed at [facility], [place] [address] [place], [place] [address]    Report Status 10/28/2020 FINAL  Final  Culture, blood (routine x 2)     Status: Abnormal   Collection Time: 10/25/20  8:06 AM   Specimen: BLOOD RIGHT WRIST  Result Value Ref Range Status   Specimen Description   Final  BLOOD RIGHT WRIST Performed at [facility], [place] [address] [place], [place] [address]    Special Requests    Final    BOTTLES DRAWN AEROBIC AND ANAEROBIC Blood Culture adequate volume Performed at [facility], [place] [address], [place], [place] [address]    Culture  Setup Time   Final    AEROBIC BOTTLE ONLY GRAM POSITIVE COCCI Gram Stain Report Called to,Read Back By and Verified With: W COUNCIL,RN_0  10/25/20 MKELLY CRITICAL RESULT CALLED TO, READ BACK BY AND VERIFIED WITH: PHARMD GREG ABBOTT 10/26/2020 AT 0440 A.HUGHES ANAEROBIC BOTTLE ALSO Performed at [facility], [address] [place], [place] [address]    Culture METHICILLIN RESISTANT STAPHYLOCOCCUS AUREUS (A)  Final   Report Status 10/28/2020 FINAL  Final   Organism ID, Bacteria METHICILLIN RESISTANT STAPHYLOCOCCUS AUREUS  Final      Susceptibility   Methicillin resistant staphylococcus aureus - MIC*    CIPROFLOXACIN <=0.5 SENSITIVE Sensitive     ERYTHROMYCIN >=8 RESISTANT Resistant     GENTAMICIN <=0.5 SENSITIVE Sensitive     OXACILLIN >=4 RESISTANT Resistant     TETRACYCLINE >=16 RESISTANT Resistant     VANCOMYCIN <=0.5 SENSITIVE Sensitive     TRIMETH/SULFA <=10 SENSITIVE Sensitive     CLINDAMYCIN <=0.25 SENSITIVE Sensitive     RIFAMPIN <=0.5 SENSITIVE Sensitive     Inducible Clindamycin NEGATIVE Sensitive     * METHICILLIN RESISTANT STAPHYLOCOCCUS AUREUS  Blood Culture ID Panel (Reflexed)     Status: Abnormal   Collection Time: 10/25/20  8:06 AM  Result Value Ref Range Status   Enterococcus faecalis NOT DETECTED NOT DETECTED Final   Enterococcus Faecium NOT DETECTED NOT DETECTED Final   Listeria monocytogenes NOT DETECTED NOT DETECTED Final   Staphylococcus species DETECTED (A) NOT DETECTED Final    Comment: RBV PHARMD GREG ABBOTT 10/26/2020 AT 0441 A.HUGHES    Staphylococcus aureus (BCID) DETECTED (A) NOT DETECTED Final    Comment: Methicillin (oxacillin)-resistant Staphylococcus aureus (MRSA). MRSA is predictably resistant to beta-lactam antibiotics (except ceftaroline). Preferred therapy is vancomycin unless  clinically contraindicated. Patient requires contact precautions if  hospitalized. RBV PHARMD GREG ABBOTT 10/26/2020 AT 0440 A.HUGHES    Staphylococcus epidermidis NOT DETECTED NOT DETECTED Final   Staphylococcus lugdunensis NOT DETECTED NOT DETECTED Final   Streptococcus species NOT DETECTED NOT DETECTED Final   Streptococcus agalactiae NOT DETECTED NOT DETECTED Final   Streptococcus pneumoniae NOT DETECTED NOT DETECTED Final   Streptococcus pyogenes NOT DETECTED NOT DETECTED Final   A.calcoaceticus-baumannii NOT DETECTED NOT DETECTED Final   Bacteroides fragilis NOT DETECTED NOT DETECTED Final   Enterobacterales NOT DETECTED NOT DETECTED Final   Enterobacter cloacae complex NOT DETECTED NOT DETECTED Final   Escherichia coli NOT DETECTED NOT DETECTED Final   Klebsiella aerogenes NOT DETECTED NOT DETECTED Final   Klebsiella oxytoca NOT DETECTED NOT DETECTED Final   Klebsiella pneumoniae NOT DETECTED NOT DETECTED Final   Proteus species NOT DETECTED NOT DETECTED Final   Salmonella species NOT DETECTED NOT DETECTED Final   Serratia marcescens NOT DETECTED NOT DETECTED Final   Haemophilus influenzae NOT DETECTED NOT DETECTED Final   Neisseria meningitidis NOT DETECTED NOT DETECTED Final   Pseudomonas aeruginosa NOT DETECTED NOT DETECTED Final   Stenotrophomonas maltophilia NOT DETECTED NOT DETECTED Final   Candida albicans NOT DETECTED NOT DETECTED Final   Candida auris NOT DETECTED NOT DETECTED Final   Candida glabrata NOT DETECTED NOT DETECTED Final   Candida krusei NOT DETECTED NOT DETECTED Final   Candida  parapsilosis NOT DETECTED NOT DETECTED Final   Candida tropicalis NOT DETECTED NOT DETECTED Final   Cryptococcus neoformans/gattii NOT DETECTED NOT DETECTED Final   Meth resistant mecA/C and MREJ DETECTED (A) NOT DETECTED Final    Comment: PHARMD GREG ABBOTT 10/26/2020 AT 0441 A.HUGHES CRITICAL RESULT CALLED TO, READ BACK BY AND VERIFIED WITH: Performed at [place], [place] [address] [place], [place] [address]   Culture, blood (routine x 2)     Status: None   Collection Time: 10/27/20  6:00 AM   Specimen: BLOOD  Result Value Ref Range Status   Specimen Description BLOOD LEFT HAND  Final   Special Requests   Final    BOTTLES DRAWN AEROBIC AND ANAEROBIC Blood Culture adequate volume   Culture   Final    NO GROWTH 5 DAYS Performed at [facility], [place] [address], [place], [place] [address]    Report Status 11/01/2020 FINAL  Final  Culture, blood (routine x 2)     Status: None   Collection Time: 10/27/20  6:05 AM   Specimen: BLOOD  Result Value Ref Range Status   Specimen Description BLOOD RIGHT FOREARM  Final   Special Requests   Final    BOTTLES DRAWN AEROBIC AND ANAEROBIC Blood Culture adequate volume   Culture   Final    NO GROWTH 5 DAYS Performed at [facility], [place] [address], [place], [place] [address]    Report Status 11/01/2020 FINAL  Final         Radiology Studies: MR BRAIN WO CONTRAST  Result Date: 11/02/2020 CLINICAL DATA:  Acute neurological deficit. Altered mental status. Negative acute CT evaluation. EXAM: MRI HEAD WITHOUT CONTRAST TECHNIQUE: Multiplanar, multiecho pulse sequences of the brain and surrounding structures were obtained without intravenous contrast. COMPARISON:  CT studies earlier same day FINDINGS: Brain: Diffusion imaging does not show any acute or subacute infarction. No focal abnormality affects the brainstem or cerebellum. Cerebral hemispheres show a few scattered foci of T2 and FLAIR signal within the white matter consistent with minimal small vessel change. No cortical or large vessel territory abnormality. No mass lesion, hemorrhage, hydrocephalus or extra-axial collection. Vascular: Major vessels at the base of the brain show flow. Skull and upper cervical spine: Negative Sinuses/Orbits: Clear/normal Other: None IMPRESSION: No acute finding. Normal study except for a few punctate foci of T2 and  FLAIR signal within the cerebral hemispheric white matter consistent with minimal small vessel change. Electronically Signed   By: Nelson Chimes M.D.   On: 11/02/2020 08:46   CT HEAD CODE STROKE WO CONTRAST`  Result Date: 11/02/2020 CLINICAL DATA:  Code stroke. 51-year-old male with altered mental status. EXAM: CT HEAD WITHOUT CONTRAST TECHNIQUE: Contiguous axial images were obtained from the base of the skull through the vertex without intravenous contrast. COMPARISON:  None. FINDINGS: Brain: Mild motion artifact. Cerebral volume is within normal limits for age. No midline shift, ventriculomegaly, mass effect, evidence of mass lesion, intracranial hemorrhage or evidence of cortically based acute infarction. No cortical encephalomalacia identified. There is patchy white matter hypodensity along the inferior frontal horns greater on the right. Vascular: No suspicious intracranial vascular hyperdensity. Skull: No acute osseous abnormality identified. There is a 3 cm nasal septal defect. Sinuses/Orbits: Small right maxillary sinus retention cyst. Otherwise well aerated. Other: [place] soft tissue thickening at the right scalp vertex. Visualized orbit soft tissues are within normal limits. ASPECTS [facility] Stroke Program Early CT Score) Total score (0-10 with 10 being  normal): 10 IMPRESSION: 1. Mild for age white matter changes. No acute cortically based infarct or acute intracranial hemorrhage identified. ASPECTS 10. 2. [place] soft tissue thickening of the right scalp. Eroded nasal septum. 3. These results were communicated to Dr. Curly Shores at 8:10 am on 11/02/2020 by text page via the [facility] messaging system. Electronically Signed   By: Genevie Ann M.D.   On: 11/02/2020 08:11   CT ANGIO HEAD NECK W WO CM W PERF (CODE STROKE)  Addendum Date: 11/02/2020   ADDENDUM REPORT: 11/02/2020 08:45 ADDENDUM: Negative CT perfusion. Electronically Signed   By: Logan Bores M.D.   On: 11/02/2020 08:45   Result Date:  11/02/2020 CLINICAL DATA:  Altered mental status. EXAM: CT ANGIOGRAPHY HEAD AND NECK CT PERFUSION BRAIN TECHNIQUE: Multidetector CT imaging of the head and neck was performed using the standard protocol during bolus administration of intravenous contrast. Multiplanar CT image reconstructions and MIPs were obtained to evaluate the vascular anatomy. Carotid stenosis measurements (when applicable) are obtained utilizing NASCET criteria, using the distal internal carotid diameter as the denominator. Multiphase CT imaging of the brain was performed following IV bolus contrast injection. Subsequent parametric perfusion maps were calculated using RAPID software. CONTRAST:  180m OMNIPAQUE IOHEXOL 350 MG/ML SOLN COMPARISON:  None. FINDINGS: CTA NECK FINDINGS Aortic arch: Standard 3 vessel aortic arch with mild atherosclerotic plaque. Widely patent arch vessel origins. Right carotid system: Patent with minimal plaque in the carotid bulb. No evidence of dissection or stenosis. Left carotid system: Patent with a small amount of calcified and soft plaque in the carotid bulb. No evidence of dissection or stenosis. Vertebral arteries: The left vertebral artery is strongly dominant and widely patent without evidence of a significant stenosis or dissection. The right vertebral artery is hypoplastic. The proximal right V1 segment is poorly visualized due to small size and shoulder artifact, and an underlying stenosis is not excluded. Skeleton: Cervical disc degeneration greatest at C6-7 where there is prominent degenerative endplate sclerosis. Advanced facet arthrosis on the left at C3-4 and on the right at C4-5 with minimal anterolisthesis at both levels. Other neck: No evidence of cervical lymphadenopathy or mass. Mild nonspecific stranding/soft tissue swelling in the neck bilaterally. Left internal jugular venous catheter terminating in the left brachiocephalic vein (similar positioning to 10/30/2020 chest radiographs). Upper  chest: No apical lung consolidation or mass. Review of the MIP images confirms the above findings CTA HEAD FINDINGS Anterior circulation: The internal carotid arteries are widely patent from skull base to carotid termini. ACAs and MCAs are patent without evidence of a proximal branch occlusion or significant proximal stenosis. No aneurysm is identified. Posterior circulation: The intracranial vertebral arteries are patent with the left being strongly dominant and the right being particularly diminutive distal to the PICA origin. Patent PICA and SCA origins are seen bilaterally. The basilar artery is widely patent. There are left larger than right posterior communicating arteries with hypoplasia of the left P1 segment. Both PCAs are patent without evidence of a significant proximal stenosis. No aneurysm is identified. Venous sinuses: As permitted by contrast timing, patent. Anatomic variants: Fetal left PCA. Review of the MIP images confirms the above findings CT Brain Perfusion Findings: ASPECTS: 10 CBF (<30%) Volume: 0 mL Perfusion (Tmax>6.0s) volume: 0 mL IMPRESSION: 1. No emergent large vessel occlusion. 2. No significant proximal intracranial stenosis. 3. Mild cervical carotid atherosclerosis without significant stenosis. 4. Patent vertebral arteries with the left being strongly dominant. 5.  Aortic Atherosclerosis (ICD10-I70.0). These results were communicated to Dr.  Bhagat at 8:26 am on 11/02/2020 by text page via the [facility] messaging system. Electronically Signed: By: Logan Bores M.D. On: 11/02/2020 08:39        Scheduled Meds: . chlordiazePOXIDE  10 mg Oral TID  . Chlorhexidine Gluconate Cloth  6 each Topical Daily  . digoxin  0.125 mg Oral Daily  . docusate sodium  100 mg Oral BID  . enoxaparin (LOVENOX) injection  40 mg Subcutaneous Q24H  . folic acid  1 mg Oral Daily  . furosemide  40 mg Oral Daily  . multivitamin with minerals  1 tablet Oral Daily  . pantoprazole (PROTONIX) IV  40 mg  Intravenous Q24H  . polyethylene glycol  17 g Oral BID  . QUEtiapine  100 mg Oral BID  . sodium chloride flush  10-40 mL Intracatheter Q12H  . sodium chloride flush  3 mL Intravenous Q12H  . spironolactone  12.5 mg Oral Daily  . tamsulosin  0.4 mg Oral Daily  . temazepam  30 mg Oral QHS  . thiamine  100 mg Oral Daily   Or  . thiamine  100 mg Intravenous Daily   Continuous Infusions: . sodium chloride Stopped (11/01/20 0821)  . sodium chloride    . sodium chloride 10 mL/hr at 11/02/20 0622  . vancomycin 1,250 mg (11/02/20 1257)     LOS: 12 days        Hosie Poisson, MD [facility]   To contact the attending provider between 7A-7P or the covering provider during after hours 7P-7A, please log into the web site [email or web address] and access using universal [place] password for that web site. If you do not have the password, please call the hospital operator.  11/02/2020, 3:14 PM

## 2020-11-02 NOTE — Progress Notes (Addendum)
Nutrition Follow-up  DOCUMENTATION CODES:   Not applicable  INTERVENTION:    Ensure Enlive po TID, each supplement provides 350 kcal and 20 grams of protein  NUTRITION DIAGNOSIS:   Increased nutrient needs related to acute illness as evidenced by estimated needs.  Ongoing   GOAL:   Patient will meet greater than or equal to 90% of their needs  Progressing   MONITOR:   Vent status,Skin,TF tolerance,Weight trends,Labs,I & O's  REASON FOR ASSESSMENT:   Ventilator,Consult Enteral/tube feeding initiation and management  ASSESSMENT:   Patient is a 60 yo who presents from home with acute systolic HF severe cardiomyopathy (EF-15%). Hx of alcohol/polysubstance abuse, cocaine positive.  Extubated 5/29. Cardiac cath unable to be completed today d/t change in mental status.  Continues to receive IV antibiotics for bacteremia. SLP following for dysphagia. Diet advanced to dysphagia 1-thin 5/29, upgraded to dysphagia 3-thin 5/30.  PO intake has been minimal today. NPO this morning d/t code stroke, but diet resumed when stroke was R/O. RN reports patient received Ativan and was very drowsy afterwards, so she was not comfortable feeding him lunch today. During RD visit, patient was lying in bed with his eyes closed, moving his hands and talking. Given recent poor intake, will add Ensure Enlive/Plus TID between meals to maximize oral intake. RN hopeful that change in medication to Haldol will help prevent such decline in mental status.   Labs reviewed.  CBG: 89 this morning  Medications reviewed and include colace, folic acid, lasix, MVI with minerals, protonix, miralax, spironolactone, flomax, thiamine.   Diet Order:   Diet Order            DIET DYS 3 Room service appropriate? Yes; Fluid consistency: Thin  Diet effective now                 EDUCATION NEEDS:   Not appropriate for education at this time  Skin:  Skin Assessment: Skin Integrity Issues: Skin Integrity  Issues:: DTI,Stage II DTI: buttocks Stage II: buttocks  Last BM:  5/30  Height:   Ht Readings from Last 1 Encounters:  10/25/20 5\' 7"  (1.702 m)    Weight:   Wt Readings from Last 1 Encounters:  11/02/20 83.9 kg    BMI:  Body mass index is 28.97 kg/m.  Estimated Nutritional Needs:   Kcal:  2000-2300 kcal  Protein:  120-130 gm  Fluid:  >/= 2 L/day    11/04/20, RD, LDN, CNSC Please refer to Amion for contact information.

## 2020-11-02 NOTE — Progress Notes (Signed)
Notified IV team regarding patient's IJ CVC access. Unable to draw blood from port. Coaxemetry panel has not been drawn as of 0630AM.

## 2020-11-02 NOTE — Progress Notes (Signed)
Pharmacy Antibiotic Note  Kyle Peck is a 60 y.o. male admitted on 10/20/2020 with bacteremia.  Pharmacy has been consulted for Vancomycin dosing.  Anticipating 4 weeks of IV abx from 5/25 when [facility] cleared for complicated MRSA bacteremia, his TTE and TEE were negative for vegetations.   Levels checked on 1250mg  IV q8h, peak 38, trough 22, patient specific kinetics: ke 0.1252, T1/2 5.5 hours, Vd 40.7L. Will continue his dose of 1250mg  and change his frequency from q8h to q12h for an estimated AUC of 489.    Vanco 5/23 >> Zosyn 5/23 >>5/24  5/27 PM : VR 8  5/25 Bcx ng 1 day 5/23 Bcx: 2/4 MRSA  MRSA PCR: positive Hepatitis/HIV panel neg 5/25 TEE neg for vegetations   Plan: Vancomycin 1250 mg IV Q 12 hrs. Goal AUC 400-550. Adjust dose as needed for weight and changing renal function.   Height: 5\' 7"  (170.2 cm) Weight: 83.9 kg (184 lb 15.5 oz) IBW/kg (Calculated) : 66.1  Temp (24hrs), Avg:98.2 F (36.8 C), Min:97.9 F (36.6 C), Max:98.4 F (36.9 C)  Recent Labs  Lab 10/28/20 0445 10/28/20 1709 10/29/20 1855 10/30/20 0310 10/31/20 0326 11/01/20 0335 11/01/20 1849 11/02/20 0623 11/02/20 1045  WBC 8.4  --   --   --   --   --   --  7.6  --   CREATININE 0.88  0.83   < >  --  0.73 0.56* 0.68 0.75 0.67  --   VANCOTROUGH  --   --   --   --   --   --   --   --  22*  VANCOPEAK  --   --   --   --   --   --   --  38  --   VANCORANDOM  --   --  8  --   --   --   --   --   --    < > = values in this interval not displayed.    Estimated Creatinine Clearance: 101.7 mL/min (by C-G formula based on SCr of 0.67 mg/dL).    No Known Allergies  11/03/20, PharmD, BCIDP Infectious Disease Pharmacist  Phone: [phone] 11/02/2020 12:34 PM

## 2020-11-02 NOTE — Progress Notes (Signed)
[facility] Infectious Diseases Follow Up Note  Patient Identification: Patient Name: Kyle Peck MRN: [id number] Admit Date: 10/20/2020 11:52 PM Age: 60 y.o.Today's Date: 11/02/2020   Reason for Visit: MRSA bacteremia  Principal Problem:   Elevated brain natriuretic peptide (BNP) level Active Problems:   CHF (congestive heart failure) (HCC)   Transaminitis   Thrombocytosis   Hypoalbuminemia due to protein-calorie malnutrition (HCC)   Tobacco use   Pressure injury of skin   Acute respiratory failure (HCC)   Cardiogenic shock (HCC)   Hyperkalemia   Acute renal failure (ARF) (HCC)   Acute encephalopathy   Polysubstance abuse (HCC)   ETOH abuse   MRSA bacteremia   Cocaine abuse (HCC)  Antibiotics:Vancomycin 5/18-current   Lines/Tubes:left IJ catheter,PIV's   Interval Events: Afebrile, no leukocytosis, extubated and has been transferred out of ICU.  Patient had a code stroke for acute AMS/change in speech   Assessment Acute change in speech- code stroke called this am with unremarkable CT Head/MRI brain and CTA head and neck. Neurology following  I saw him in the afternoon after code stroke and he was able to answer some questions appropriately but was intermittently confused. It was difficult to understand what he was talking    Complicated MRSA Bacteremia ( ? Nosocomial )-TEE and TEE negative for vegetations.  No other metastatic sites of infection and no known hardware  Acute systolic CHF-cardiology following plan for cath Combined shock, resolved Acute respiratory failure status postintubation resolved AKI initially on CRRT but resolved  Transaminitis improving Alcohol withdrawal Polysubstance abuse Sacral ulcer  Recommendations Continue vancomycin  Will need at least 4 weeks of IV antibiotics from date of negative blood cultures on 5/25 Neurology following for acute onset AMS and change  in speech Monitor CBC, BMP and Vancomycin trough  Following   Rest of the management as per the primary team. Thank you for the consult. Please page with pertinent questions or concerns.  ______________________________________________________________________ Subjective patient seen and examined at the bedside. He is talking by himself and is able to tell me he is at [facility] and the month is May   Vitals BP 119/84 (BP Location: Left Arm)   Pulse (!) 121   Temp 98 F (36.7 C) (Oral)   Resp 16   Ht 5\' 7"  (1.702 m)   Wt 83.9 kg   SpO2 98%   BMI 28.97 kg/m     Physical Exam Constitutional:  lying in bed, speaking non stop, hard to understand     Comments:   Cardiovascular:     Rate and Rhythm: Normal rate and regular rhythm.     Heart sounds: soft systolic murmur   Pulmonary:     Effort: Pulmonary effort is normal.     Comments:   Abdominal:     Palpations: Abdomen is soft.     Tenderness: Non tender and non distended   Musculoskeletal:        General: No swelling or tenderness.   Skin:    Comments: No lesions or rashes   Neurological:     General: grossly non focal, spontaneously moves all extremities, follows commands   Psychiatric:        Mood and Affect:unable to assess   Pertinent Microbiology Results for orders placed or performed during the hospital encounter of 10/20/20  SARS CORONAVIRUS 2 (TAT 6-24 HRS) Nasopharyngeal Nasopharyngeal Swab     Status: None   Collection Time: 10/21/20  2:00 AM   Specimen: Nasopharyngeal Swab  Result Value  Ref Range Status   SARS Coronavirus 2 NEGATIVE NEGATIVE Final    Comment: (NOTE) SARS-CoV-2 target nucleic acids are NOT DETECTED.  The SARS-CoV-2 RNA is generally detectable in upper and lower respiratory specimens during the acute phase of infection. Negative results do not preclude SARS-CoV-2 infection, do not rule out co-infections with other pathogens, and should not be used as the sole basis for treatment  or other patient management decisions. Negative results must be combined with clinical observations, patient history, and epidemiological information. The expected result is Negative.  Fact Sheet for Patients: HairSlick.no  Fact Sheet for Healthcare Providers: quierodirigir.com  This test is not yet approved or cleared by the [place] FDA and  has been authorized for detection and/or diagnosis of SARS-CoV-2 by FDA under an Emergency Use Authorization (EUA). This EUA will remain  in effect (meaning this test can be used) for the duration of the COVID-19 declaration under Se ction 564(b)(1) of the Act, 21 U.S.C. section 360bbb-3(b)(1), unless the authorization is terminated or revoked sooner.  Performed at [facility], [address], [place], [place] [address]   MRSA PCR Screening     Status: Abnormal   Collection Time: 10/25/20  6:30 AM   Specimen: Nasal Mucosa; Nasopharyngeal  Result Value Ref Range Status   MRSA by PCR POSITIVE (A) NEGATIVE Final    Comment:        The GeneXpert MRSA Assay (FDA approved for NASAL specimens only), is one component of a comprehensive MRSA colonization surveillance program. It is not intended to diagnose MRSA infection nor to guide or monitor treatment for MRSA infections. RESULT CALLED TO, READ BACK BY AND VERIFIED WITH: FOLEY,B. AT 0845 BY HUFFINES,S ON 10/25/20. Performed at [facility], [address], [place], [place] [address]   Culture, blood (routine x 2)     Status: Abnormal   Collection Time: 10/25/20  8:06 AM   Specimen: BLOOD  Result Value Ref Range Status   Specimen Description   Final    BLOOD RIGHT ANTECUBITAL Performed at [facility], [address], [place], [place] [address]    Special Requests   Final    BOTTLES DRAWN AEROBIC AND ANAEROBIC Blood Culture adequate volume Performed at [facility], [address] [place], [place] [address]    Culture   Setup Time   Final    AEROBIC BOTTLE ONLY GRAM POSITIVE COCCI Gram Stain Report Called to,Read Back By and Verified With: W COUNCIL,RN@0200  10/26/20 MKELLY CRITICAL VALUE NOTED.  VALUE IS CONSISTENT WITH PREVIOUSLY REPORTED AND CALLED VALUE.    Culture (A)  Final    STAPHYLOCOCCUS AUREUS SUSCEPTIBILITIES PERFORMED ON PREVIOUS CULTURE WITHIN THE LAST 5 DAYS. Performed at [facility], [address], [place], [place] [address]    Report Status 10/28/2020 FINAL  Final  Culture, blood (routine x 2)     Status: Abnormal   Collection Time: 10/25/20  8:06 AM   Specimen: BLOOD RIGHT WRIST  Result Value Ref Range Status   Specimen Description   Final    BLOOD RIGHT WRIST Performed at [facility], [address] [place], [place] [address]    Special Requests   Final    BOTTLES DRAWN AEROBIC AND ANAEROBIC Blood Culture adequate volume Performed at [facility], [address], [place], [place] [address]    Culture  Setup Time   Final    AEROBIC BOTTLE ONLY GRAM POSITIVE COCCI Gram Stain Report Called to,Read Back By  and Verified With: W COUNCIL,RN@2204  10/25/20 MKELLY CRITICAL RESULT CALLED TO, READ BACK BY AND VERIFIED WITH: PHARMD GREG ABBOTT 10/26/2020 AT 0440 A.HUGHES ANAEROBIC BOTTLE ALSO Performed at [facility], [address] [place], [place] [address]    Culture METHICILLIN RESISTANT STAPHYLOCOCCUS AUREUS (A)  Final   Report Status 10/28/2020 FINAL  Final   Organism ID, Bacteria METHICILLIN RESISTANT STAPHYLOCOCCUS AUREUS  Final      Susceptibility   Methicillin resistant staphylococcus aureus - MIC*    CIPROFLOXACIN <=0.5 SENSITIVE Sensitive     ERYTHROMYCIN >=8 RESISTANT Resistant     GENTAMICIN <=0.5 SENSITIVE Sensitive     OXACILLIN >=4 RESISTANT Resistant     TETRACYCLINE >=16 RESISTANT Resistant     VANCOMYCIN <=0.5 SENSITIVE Sensitive     TRIMETH/SULFA <=10 SENSITIVE Sensitive     CLINDAMYCIN <=0.25 SENSITIVE Sensitive     RIFAMPIN <=0.5  SENSITIVE Sensitive     Inducible Clindamycin NEGATIVE Sensitive     * METHICILLIN RESISTANT STAPHYLOCOCCUS AUREUS  Blood Culture ID Panel (Reflexed)     Status: Abnormal   Collection Time: 10/25/20  8:06 AM  Result Value Ref Range Status   Enterococcus faecalis NOT DETECTED NOT DETECTED Final   Enterococcus Faecium NOT DETECTED NOT DETECTED Final   Listeria monocytogenes NOT DETECTED NOT DETECTED Final   Staphylococcus species DETECTED (A) NOT DETECTED Final    Comment: RBV PHARMD GREG ABBOTT 10/26/2020 AT 0441 A.HUGHES    Staphylococcus aureus (BCID) DETECTED (A) NOT DETECTED Final    Comment: Methicillin (oxacillin)-resistant Staphylococcus aureus (MRSA). MRSA is predictably resistant to beta-lactam antibiotics (except ceftaroline). Preferred therapy is vancomycin unless clinically contraindicated. Patient requires contact precautions if  hospitalized. RBV PHARMD GREG ABBOTT 10/26/2020 AT 0440 A.HUGHES    Staphylococcus epidermidis NOT DETECTED NOT DETECTED Final   Staphylococcus lugdunensis NOT DETECTED NOT DETECTED Final   Streptococcus species NOT DETECTED NOT DETECTED Final   Streptococcus agalactiae NOT DETECTED NOT DETECTED Final   Streptococcus pneumoniae NOT DETECTED NOT DETECTED Final   Streptococcus pyogenes NOT DETECTED NOT DETECTED Final   A.calcoaceticus-baumannii NOT DETECTED NOT DETECTED Final   Bacteroides fragilis NOT DETECTED NOT DETECTED Final   Enterobacterales NOT DETECTED NOT DETECTED Final   Enterobacter cloacae complex NOT DETECTED NOT DETECTED Final   Escherichia coli NOT DETECTED NOT DETECTED Final   Klebsiella aerogenes NOT DETECTED NOT DETECTED Final   Klebsiella oxytoca NOT DETECTED NOT DETECTED Final   Klebsiella pneumoniae NOT DETECTED NOT DETECTED Final   Proteus species NOT DETECTED NOT DETECTED Final   Salmonella species NOT DETECTED NOT DETECTED Final   Serratia marcescens NOT DETECTED NOT DETECTED Final   Haemophilus influenzae NOT DETECTED  NOT DETECTED Final   Neisseria meningitidis NOT DETECTED NOT DETECTED Final   Pseudomonas aeruginosa NOT DETECTED NOT DETECTED Final   Stenotrophomonas maltophilia NOT DETECTED NOT DETECTED Final   Candida albicans NOT DETECTED NOT DETECTED Final   Candida auris NOT DETECTED NOT DETECTED Final   Candida glabrata NOT DETECTED NOT DETECTED Final   Candida krusei NOT DETECTED NOT DETECTED Final   Candida parapsilosis NOT DETECTED NOT DETECTED Final   Candida tropicalis NOT DETECTED NOT DETECTED Final   Cryptococcus neoformans/gattii NOT DETECTED NOT DETECTED Final   Meth resistant mecA/C and MREJ DETECTED (A) NOT DETECTED Final    Comment: PHARMD GREG ABBOTT 10/26/2020 AT 0441 A.HUGHES CRITICAL RESULT CALLED TO, READ BACK BY AND VERIFIED WITH: Performed at [facility], [address] [place], [place] [address]   Culture, blood (routine  x 2)     Status: None   Collection Time: 10/27/20  6:00 AM   Specimen: BLOOD  Result Value Ref Range Status   Specimen Description BLOOD LEFT HAND  Final   Special Requests   Final    BOTTLES DRAWN AEROBIC AND ANAEROBIC Blood Culture adequate volume   Culture   Final    NO GROWTH 5 DAYS Performed at [facility], [address], [place], [place] [address]    Report Status 11/01/2020 FINAL  Final  Culture, blood (routine x 2)     Status: None   Collection Time: 10/27/20  6:05 AM   Specimen: BLOOD  Result Value Ref Range Status   Specimen Description BLOOD RIGHT FOREARM  Final   Special Requests   Final    BOTTLES DRAWN AEROBIC AND ANAEROBIC Blood Culture adequate volume   Culture   Final    NO GROWTH 5 DAYS Performed at [facility], [address], [place], [place] [address]    Report Status 11/01/2020 FINAL  Final   Pertinent Lab. CBC Latest Ref Rng & Units 11/02/2020 10/28/2020 10/26/2020  WBC 4.0 - 10.5 K/uL 7.6 8.4 6.7  Hemoglobin 13.0 - 17.0 g/dL 12.1(L) 10.8(L) 12.1(L)  Hematocrit 39.0 - 52.0 % 38.1(L) 36.6(L) 38.7(L)   Platelets 150 - 400 K/uL 298 214 280   CMP Latest Ref Rng & Units 11/02/2020 11/01/2020 11/01/2020  Glucose 70 - 99 mg/dL 88 656(C) 77  BUN 6 - 20 mg/dL 13 16 19   Creatinine 0.61 - 1.24 mg/dL 1.27 5.17  Sodium 135 - 145 mmol/L 136 134(L) 138  Potassium 3.5 - 5.1 mmol/L 4.1 3.8 3.3(L)  Chloride 98 - 111 mmol/L 99 98 98  CO2 22 - 32 mmol/L 28 30 34(H)  Calcium 8.9 - 10.3 mg/dL 8.1(L) 8.0(L) 8.3(L)  Total Protein 6.5 - 8.1 g/dL 0.01) - -  Total Bilirubin 0.3 - 1.2 mg/dL 7.4(B) - -  Alkaline Phos 38 - 126 U/L 64 - -  AST 15 - 41 U/L 30 - -  ALT 0 - 44 U/L 105(H) - -     Pertinent Imaging today Plain films and CT images have been personally visualized and interpreted; radiology reports have been reviewed. Decision making incorporated into the Impression / Recommendations.  CT head 11/02/20 IMPRESSION: 1. Mild for age white matter changes. No acute cortically based infarct or acute intracranial hemorrhage identified. ASPECTS 10. 2. Verrucas soft tissue thickening of the right scalp. Eroded nasal septum.  CT angio head and neck 11/02/20 IMPRESSION: 1. No emergent large vessel occlusion. 2. No significant proximal intracranial stenosis. 3. Mild cervical carotid atherosclerosis without significant stenosis. 4. Patent vertebral arteries with the left being strongly dominant. 5.  Aortic Atherosclerosis (ICD10-I70.0).  MRI brain WO contrast 11/02/20 IMPRESSION: No acute finding. Normal study except for a few punctate foci of T2 and FLAIR signal within the cerebral hemispheric white matter consistent with minimal small vessel change.  I have spent more than 35 minutes for this patient encounter including review of prior medical records, coordination of care  with greater than 50% of time being face to face/counseling and discussing diagnostics/treatment plan with the patient/family.  Electronically signed by:   11/04/20, MD Infectious Disease Physician [facility] for Infectious Disease Pager: [phone]

## 2020-11-02 NOTE — Progress Notes (Addendum)
Patient ID: Kyle Peck, male   DOB: Sep 09, 1960, 60 y.o.   MRN: [id number]     Advanced Heart Failure Rounding Note  PCP-Cardiologist: None   Subjective:    Transferred to [facility] from [facility] for shock treatment.  MRSA PNA/bacteremia, on vancomycin.  TEE 5/25: EF 20%, mild RV dysfunction, no endocarditis.  10/31/20 Extubated.   Taken down to cath. Denies pain. Repeating phrases. Says he needs to talk to his wife about papers.  Objective:   Weight Range: 83.9 kg Body mass index is 28.97 kg/m.   Vital Signs:   Temp:  [97.9 F (36.6 C)-99 F (37.2 C)] 98 F (36.7 C) (05/31 0321) Pulse Rate:  [102-125] 121 (05/31 0321) Resp:  [16-26] 16 (05/31 0321) BP: (80-130)/(50-91) 119/84 (05/31 0321) SpO2:  [91 %-100 %] 98 % (05/31 0321) Weight:  [83.9 kg] 83.9 kg (05/31 0321) Last BM Date: 11/01/20  Weight change: Filed Weights   10/30/20 0149 10/31/20 0615 11/02/20 0321  Weight: 89.2 kg 85.3 kg 83.9 kg    Intake/Output:   Intake/Output Summary (Last 24 hours) at 11/02/2020 0724 Last data filed at 11/02/2020 0340 Gross per 24 hour  Intake 1027.66 ml  Output 876 ml  Net 151.66 ml      Physical Exam   General:   No resp difficulty HEENT: normal Neck: supple. no JVD. Carotids 2+ bilat; no bruits. No lymphadenopathy or thryomegaly appreciated. Cor: PMI nondisplaced. Regular rate & rhythm. No rubs, gallops or murmurs. Lungs: clear Abdomen: soft, nontender, nondistended. No hepatosplenomegaly. No bruits or masses. Good bowel sounds. Extremities: no cyanosis, clubbing, rash, edema Neuro: Awake. 0740 repeating phrases and 0744 started grunting and non verbal. MAEx 4   Telemetry   SR with occasional PVCs brief NSVT    Labs    CBC Recent Labs    11/02/20 0623  WBC 7.6  HGB 12.1*  HCT 38.1*  MCV 86.0  PLT 298   Basic Metabolic Panel Recent Labs    95/18/84 0335 11/01/20 1849 11/02/20 0623  NA 138 134* 136  K 3.3* 3.8 4.1  CL 98 98 99  CO2 34* 30 28  GLUCOSE 77  120* 88  BUN 19 16 13   CREATININE 0.68 0.75 0.67  CALCIUM 8.3* 8.0* 8.1*  MG 1.7 2.3 2.0  PHOS 3.9  --  3.7   Liver Function Tests Recent Labs    11/01/20 0335 11/02/20 0623  AST  --  30  ALT  --  105*  ALKPHOS  --  64  BILITOT  --  1.8*  PROT  --  5.8*  ALBUMIN 2.6* 2.7*   No results for input(s): LIPASE, AMYLASE in the last 72 hours. Cardiac Enzymes No results for input(s): CKTOTAL, CKMB, CKMBINDEX, TROPONINI in the last 72 hours.  BNP: BNP (last 3 results) Recent Labs    10/21/20 0033 10/23/20 0504  BNP 2,252.0* 1,685.0*    ProBNP (last 3 results) No results for input(s): PROBNP in the last 8760 hours.   D-Dimer No results for input(s): DDIMER in the last 72 hours. Hemoglobin A1C No results for input(s): HGBA1C in the last 72 hours. Fasting Lipid Panel No results for input(s): CHOL, HDL, LDLCALC, TRIG, CHOLHDL, LDLDIRECT in the last 72 hours. Thyroid Function Tests No results for input(s): TSH, T4TOTAL, T3FREE, THYROIDAB in the last 72 hours.  Invalid input(s): FREET3  Other results:   Imaging    No results found.   Medications:     Scheduled Medications: . chlordiazePOXIDE  10 mg Oral  TID  . Chlorhexidine Gluconate Cloth  6 each Topical Daily  . digoxin  0.125 mg Oral Daily  . docusate sodium  100 mg Oral BID  . enoxaparin (LOVENOX) injection  40 mg Subcutaneous Q24H  . folic acid  1 mg Oral Daily  . furosemide  40 mg Oral Daily  . pantoprazole (PROTONIX) IV  40 mg Intravenous Q24H  . polyethylene glycol  17 g Oral BID  . QUEtiapine  100 mg Oral BID  . sodium chloride flush  10-40 mL Intracatheter Q12H  . sodium chloride flush  3 mL Intravenous Q12H  . spironolactone  12.5 mg Oral Daily  . tamsulosin  0.4 mg Oral Daily  . temazepam  30 mg Oral QHS  . thiamine  100 mg Oral Daily   Or  . thiamine  100 mg Intravenous Daily    Infusions: . sodium chloride Stopped (11/01/20 0821)  . sodium chloride    . sodium chloride 10 mL/hr at  11/02/20 0622  . DOBUTamine Stopped (11/01/20 0947)  . vancomycin 1,250 mg (11/02/20 0340)    PRN Medications: sodium chloride, acetaminophen (TYLENOL) oral liquid 160 mg/5 mL, diphenhydrAMINE, guaiFENesin-dextromethorphan, haloperidol lactate, ondansetron (ZOFRAN) IV, sodium chloride flush, sodium chloride flush     Assessment/Plan   1. Shock: Suspect mixed cardiogenic and septic with MRSA on blood cultures.  - Off NE, now on dobutamine 1  Co-ox 62%. Stop DBA 2. Acute systolic CHF: Echo this admission with EF 15%, moderate LV dilation, mildly decreased RV function, mild MR. Cause uncertain, could be due to ETOH abuse and cocaine but cannot rule out CAD. Presentation consistent with CHF, not ACS.  HS-TnI in setting of hypotension/shock was 204 => 141. Now off CVVH (stopped 5/26). Now off dobutamine  - CO- OX stable 75%. Renal function stable.  - Continue spiro 12.5.  - Continue dig - Consider ARNI/ARB post cath if BP tolerates.  - Cath cancelled.  3. AKI: Creatinine up to 2.09 with persistent hyperkalemia and minimal UOP. Cr now stable 0.6. Now off CVVH and making urine.    - Renal has signed off 4. ETOH abuse with withdrawal.  - CCM managing 5. Cocaine abuse: likely contributing to WD 6. Acute hypoxemic respiratory failure: Initially in setting of ETOH withdrawal and altered mental status. Extubated 10/31/20 7. ID: MRSA in blood, ?PNA source.  PCT 1.5 => 1.04.  TEE 5/25 showed no endocarditis.  - Continue vancomycin 8. NSVT - keep K> 4.0 Mg > 2.0 - supp K 9. AMS This morning repeating phrases. Not sure when this occurred. He was not like this yesterday. Dr Kyle Peck at bedside. Sending for stat CT of head. -  0744 stopped speaking and started grunting. MAE x4. CODE Stroke called.   Dr Kyle Peck contacted his wife.   Kyle Becket, NP-C  11/02/2020 7:24 AM  Patient seen with NP, agree with the above note.    Now off dobutamine, stable creatinine 0.67 and co-ox 75% this morning.     He was brought down to cath today but was confused with pressured speech in short stay.  I spoke with his wife, this is a change from last night (he was clear per his wife) and cath was canceled.  While still in short stay, he stopped speaking and would only grunt.  Able to follow commands and move all extremities.   Code stroke called, to send patient for head CT.   General: NAD Neck: No JVD, no thyromegaly or thyroid nodule.  Lungs: Clear  to auscultation bilaterally with normal respiratory effort. CV: Nondisplaced PMI.  Heart regular S1/S2, no S3/S4, no murmur.  No peripheral edema.  No carotid bruit.  Normal pedal pulses.  Abdomen: Soft, nontender, no hepatosplenomegaly, no distention.  Skin: Intact without lesions or rashes.  Neurologic: Awake, unable to speak but follows commands and moves all extremities.  Extremities: No clubbing or cyanosis.  HEENT: Normal.   Neurology consult stat with head CT arranged.  ?Acute CVA.  Last known normal was last night.  Also have been concerned for ETOH withdrawal during this admission, but he has been here almost 2 wks.   If not acute CVA, would re-attempt LHC/RHC when mental status has cleared.   Continues on vancomycin for MRSA bacteremia.   Continue current cardiac meds.   CRITICAL CARE Performed by: Marca Ancona  Total critical care time: 35 minutes  Critical care time was exclusive of separately billable procedures and treating other patients.  Critical care was necessary to treat or prevent imminent or life-threatening deterioration.  Critical care was time spent personally by me on the following activities: development of treatment plan with patient and/or surrogate as well as nursing, discussions with consultants, evaluation of patient's response to treatment, examination of patient, obtaining history from patient or surrogate, ordering and performing treatments and interventions, ordering and review of laboratory studies, ordering  and review of radiographic studies, pulse oximetry and re-evaluation of patient's condition.  Marca Ancona 11/02/2020 8:03 AM

## 2020-11-02 NOTE — Progress Notes (Signed)
   11/02/20 1108  What Happened  Was fall witnessed? No  Was patient injured? No  Patient found on floor  Found by Staff-comment  Stated prior activity bathroom-unassisted  Follow Up  MD notified Dr. Blake Divine  Time MD notified 1200  Family notified Yes - comment (Wife and daughter)  Time family notified 1222 (Notified by Victorino December RN)  Additional tests No  Progress note created (see row info) Yes  Adult Fall Risk Assessment  Risk Factor Category (scoring not indicated) Fall has occurred during this admission (document High fall risk)  Age 60  Fall History: Fall within 6 months prior to admission 0  Elimination; Bowel and/or Urine Incontinence 2  Elimination; Bowel and/or Urine Urgency/Frequency 2  Medications: includes PCA/Opiates, Anti-convulsants, Anti-hypertensives, Diuretics, Hypnotics, Laxatives, Sedatives, and Psychotropics 5  Patient Care Equipment 1  Mobility-Assistance 2  Mobility-Gait 2  Mobility-Sensory Deficit 0  Altered awareness of immediate physical environment 1  Impulsiveness 2  Lack of understanding of one's physical/cognitive limitations 4  Total Score 22  Patient Fall Risk Level High fall risk  Adult Fall Risk Interventions  Required Bundle Interventions *See Row Information* High fall risk - low, moderate, and high requirements implemented  Additional Interventions Use of appropriate toileting equipment (bedpan, BSC, etc.);Reorient/diversional activities with confused patients;PT/OT need assessed if change in mobility from baseline  Screening for Fall Injury Risk (To be completed on HIGH fall risk patients) - Assessing Need for Floor Mats  Risk For Fall Injury- Criteria for Floor Mats Noncompliant with safety precautions;Previous fall this admission  Will Implement Floor Mats Yes

## 2020-11-02 NOTE — TOC Progression Note (Addendum)
Transition of Care (TOC) - Progression Note  Heart Failure   Patient Details  Name: Quentyn Kolbeck MRN: [id number] Date of Birth: 23-Sep-1960  Transition [facility]) CM/SW Contact  Ardelia Wrede, LCSWA Phone Number: 11/02/2020, 10:22 AM  Clinical Narrative:    CSW spoke with the patient and his wife, Eber Jones and daughter, Elmarie Shiley at bedside to follow up regarding the conversation with the patient from yesterday and provide resources for rental assistance. The patients wife Eber Jones reported that their landlord does want them out and that they will need to find another living situation soon but that the landlord does know her husband is in the hospital and that she may be able to get an extension but isn't sure of how long. Mr. Mohl was speaking incoherently while CSW engaged with the patients family. CSW will make a referral to the [facility] outpatient social worker for housing support as well. CSW spoke with the patients wife Eber Jones about getting Mr. Formby a primary care doctor and CSW provided her with a primary care list and Eber Jones decided on [facility] and CSW will try to schedule Mr. Arizmendi a hospital follow/new patient appointment with them and will let them know.  2:00pm - CSW was able to schedule Mr. Otterson a new patient appointment at [facility] with Dina Rich, MD for 11/29/2020 at 1:20pm arrival time for 1:05pm information added to patients AVS. CSW will notify patient and family regarding PCP appointment.  CSW will continue to follow throughout discharge.   Expected Discharge Plan: OP Rehab Barriers to Discharge: Continued Medical Work up  Expected Discharge Plan and Services Expected Discharge Plan: OP Rehab In-house Referral: Clinical Social Work     Living arrangements for the past 2 months: Single Family Home                                       Social Determinants of Health (SDOH) Interventions Food Insecurity Interventions: Other  (Comment) (Patients wife reports receiving Food Stamps) Financial Strain Interventions: Other (Comment),Financial Counselor (Provided patient with CAFA application and will provide the patient with rental assistance resources) Housing Interventions: Other (Comment) ([facility] outpatient clinic provided support for water bill referral to rental assistance.) Transportation Interventions: Intervention Not Indicated  Readmission Risk Interventions No flowsheet data found.  Eluzer Howdeshell, MSW, LCSWA [phone] Heart Failure Social Worker

## 2020-11-02 NOTE — Social Work (Signed)
CSW received consult for substance use resources for patient.CSW unable to complete assessment currently due to patients orientation. CSW following to complete assessment when appropriate.

## 2020-11-02 NOTE — Consult Note (Addendum)
Neurology Consultation  Reason for Consult: Acute encephalopathy with speech changes Referring Physician: Dr. Blake Divine  CC: Acute speech abnormality  History is obtained from: Chart review, Cardiologist, inpatient RN  HPI: Kyle Peck is a 60 y.o. male with a medical history significant for ETOH and polysubstance abuse who initially presented to [facility] ED 5/19 for evaluation of leg and abdominal swelling with an elevated BNP and was found to have acute systolic heart failure and cardiogenic shock with an EF of 15 - 20% and global hypokinesis on TEE 5/25 without evidence of endocarditis. Hospitalization has been complicated by MRSA pneumonia / bacteremia on vancomycin, acute respiratory failure requiring intubation, concern for alcohol withdrawal, acute kidney injury requiring CRRT, and hypotension requiring pressors. He was extubated on 5/29 and CRRT was discontinued 5/28.   This morning, [name] was taken to cath lab for a cardiac catheterization when he had a sudden change in his speech with perseveration starting at 07:40 that quickly progressed to decreased verbalization with grunting and a code stroke was activated. Per bedside RN, at 07:20 Mr. Vitrano was at baseline, speaking normally in his hoarse voice but communicating effectively and "talking a lot".    LKW: 07:20 tpa given?: no, imaging without evidence of acute ischemic stroke IR Thrombectomy? No, imaging without evidence of LVO Modified Rankin Scale: 0-Completely asymptomatic and back to baseline post- stroke  ROS: Unable to obtain due to altered mental status.   Past Medical History:  Diagnosis Date  . Alcohol use   . Arthritis   . Marijuana use   . Tobacco abuse    Past Surgical History:  Procedure Laterality Date  . FRACTURE SURGERY     cheek    Family History  Problem Relation Age of Onset  . Congestive Heart Failure Mother   . Congestive Heart Failure Father    Social History:   reports that he has been  smoking cigars and cigarettes. He has been smoking about 0.50 packs per day. He has never used smokeless tobacco. He reports current alcohol use of about 15.0 standard drinks of alcohol per week. He reports current drug use. Frequency: 2.00 times per week. Drug: Marijuana.  Medications  Current Facility-Administered Medications:  .  0.9 %  sodium chloride infusion, 250 mL, Intravenous, Continuous, Sherryll Burger, Pratik D, DO, Stopped at 11/01/20 [phone] .  0.9 %  sodium chloride infusion, 250 mL, Intravenous, PRN, Robbie Lis M, PA-C .  0.9 %  sodium chloride infusion, , Intravenous, Continuous, Robbie Lis M, PA-C, Last Rate: 10 mL/hr at 11/02/20 1829, New Bag at 11/02/20 [phone] .  acetaminophen (TYLENOL) 160 MG/5ML solution 650 mg, 650 mg, Oral, Q4H PRN, Icard, Bradley L, DO .  chlordiazePOXIDE (LIBRIUM) capsule 10 mg, 10 mg, Oral, TID, Icard, Bradley L, DO, 10 mg at 11/01/20 2131 .  Chlorhexidine Gluconate Cloth 2 % PADS 6 each, 6 each, Topical, Daily, Icard, Bradley L, DO, 6 each at 11/02/20 0419 .  digoxin (LANOXIN) tablet 0.125 mg, 0.125 mg, Oral, Daily, Bensimhon, Bevelyn Buckles, MD, 0.125 mg at 11/01/20 6967 .  diphenhydrAMINE (BENADRYL) capsule 25 mg, 25 mg, Oral, Q6H PRN, Icard, Bradley L, DO .  DOBUTamine (DOBUTREX) infusion 4000 mcg/mL, 1 mcg/kg/min, Intravenous, Titrated, Bensimhon, Bevelyn Buckles, MD, Stopped at 11/01/20 [phone] .  docusate sodium (COLACE) capsule 100 mg, 100 mg, Oral, BID, Icard, Bradley L, DO, 100 mg at 11/01/20 2131 .  enoxaparin (LOVENOX) injection 40 mg, 40 mg, Subcutaneous, Q24H, Adefeso, Oladapo, DO, 40 mg at 11/01/20 0856 .  folic acid (FOLVITE) tablet 1 mg, 1 mg, Oral, Daily, Icard, Bradley L, DO, 1 mg at 11/01/20 0953 .  furosemide (LASIX) tablet 40 mg, 40 mg, Oral, Daily, Lorin Glass, MD, 40 mg at 11/01/20 1610 .  guaiFENesin-dextromethorphan (ROBITUSSIN DM) 100-10 MG/5ML syrup 5 mL, 5 mL, Oral, Q4H PRN, Lorin Glass, MD, 5 mL at 11/01/20 1202 .  haloperidol  lactate (HALDOL) injection 2 mg, 2 mg, Intravenous, Q6H PRN, Sherryll Burger, Pratik D, DO, 2 mg at 10/30/20 0949 .  ondansetron (ZOFRAN) injection 4 mg, 4 mg, Intravenous, Q6H PRN, Sherryll Burger, Pratik D, DO .  pantoprazole (PROTONIX) injection 40 mg, 40 mg, Intravenous, Q24H, Raymon Mutton F, NP, 40 mg at 11/01/20 1858 .  polyethylene glycol (MIRALAX / GLYCOLAX) packet 17 g, 17 g, Oral, BID, Icard, Bradley L, DO .  QUEtiapine (SEROQUEL) tablet 100 mg, 100 mg, Oral, BID, Icard, Bradley L, DO, 100 mg at 11/01/20 2131 .  sodium chloride flush (NS) 0.9 % injection 10-40 mL, 10-40 mL, Intracatheter, Q12H, Icard, Bradley L, DO, 10 mL at 11/01/20 2300 .  sodium chloride flush (NS) 0.9 % injection 10-40 mL, 10-40 mL, Intracatheter, PRN, Icard, Bradley L, DO .  sodium chloride flush (NS) 0.9 % injection 3 mL, 3 mL, Intravenous, Q12H, Simmons, Brittainy M, PA-C .  sodium chloride flush (NS) 0.9 % injection 3 mL, 3 mL, Intravenous, PRN, Sharol Harness, Brittainy M, PA-C .  spironolactone (ALDACTONE) tablet 12.5 mg, 12.5 mg, Oral, Daily, Bensimhon, Bevelyn Buckles, MD, 12.5 mg at 11/01/20 0953 .  tamsulosin (FLOMAX) capsule 0.4 mg, 0.4 mg, Oral, Daily, Icard, Bradley L, DO, 0.4 mg at 11/01/20 0952 .  temazepam (RESTORIL) capsule 30 mg, 30 mg, Oral, QHS, Lorin Glass, MD, 30 mg at 11/01/20 2302 .  thiamine tablet 100 mg, 100 mg, Oral, Daily, 100 mg at 11/01/20 0953 **OR** thiamine (B-1) injection 100 mg, 100 mg, Intravenous, Daily, Icard, Bradley L, DO .  vancomycin (VANCOREADY) IVPB 1250 mg/250 mL, 1,250 mg, Intravenous, Q8H, Earnie Larsson, RPH, Last Rate: 166.7 mL/hr at 11/02/20 0340, 1,250 mg at 11/02/20 0340  Exam: Current vital signs: BP 119/84 (BP Location: Left Arm)   Pulse (!) 121   Temp 98 F (36.7 C) (Oral)   Resp 16   Ht 5\' 7"  (1.702 m)   Wt 83.9 kg   SpO2 98%   BMI 28.97 kg/m  Vital signs in last 24 hours: Temp:  [97.9 F (36.6 C)-99 F (37.2 C)] 98 F (36.7 C) (05/31 0321) Pulse Rate:  [106-125] 121 (05/31  0321) Resp:  [16-26] 16 (05/31 0321) BP: (80-130)/(50-91) 119/84 (05/31 0321) SpO2:  [91 %-100 %] 98 % (05/31 0727) Weight:  [83.9 kg] 83.9 kg (05/31 0321)  GENERAL: Awake, alert, in no acute distress Psych: Appears slightly anxious and restless on examination but remains cooperative with examination Head: Normocephalic and atraumatic EENT: Normal conjunctivae, no OP obstruction LUNGS: Normal respiratory effort, non-labored breathing  CV: Tachycardic on cardiac monitor, extremities warm ABDOMEN: Soft, non-tender Ext: warm, without obvious deformity  NEURO:  Mental Status: Awake, alert, and oriented to self and age. He does not state the month or year correctly.  His speech is fragmented with some grunting but with intermittent clear words. He is intermittently dysarthric and aphasic with naming intermittently intact. He attempts to communicate by writing with unintelligible lettering on paper.  His speech is not fluent and he is hoarse when speaking and grunting in place of some words.  No neglect is noted. Cranial Nerves:  II: PERRL 5 mm / brisk. Visual fields full.  III, IV, VI: EOMI without ptosis V: Sensation is intact to light touch and symmetrical to face.  VII: Face is symmetric resting and smiling.  VIII: Hearing is intact to voice IX, X: Hoarse voice with mumbling, phonation not intact. Patient does not allow examiner to view palate. XI: Normal sternocleidomastoid and trapezius muscle strength XII: Tongue protrudes midline without fasciculations.   Motor: 5/5 strength present in bilateral upper extremities. Bilateral lower extremities with equal 4/5 strength with vertical drift. There is a fine tremor noted in bilateral hands with intention.  Bulk and tone are normal.  Sensation: Intact and symmetric to light touch stimuli bilaterally in upper and lower extremities. Coordination: FTN intact bilaterally. HKS intact bilaterally.  DTRs: 2+ and symmetric biceps and patellae   Gait: Deferred  NIHSS: 1a Level of Conscious.: 0 1b LOC Questions: 1 1c LOC Commands: 0 2 Best Gaze: 0 3 Visual: 0 4 Facial Palsy: 0 5a Motor Arm - left: 0 5b Motor Arm - Right: 0 6a Motor Leg - Left: 1 6b Motor Leg - Right: 1 7 Limb Ataxia: 0 8 Sensory: 0 9 Best Language: 1 10 Dysarthria: 1 11 Extinct. and Inatten.: 0 TOTAL: 5  Labs I have reviewed labs in epic and the results pertinent to this consultation are: CBC    Component Value Date/Time   WBC 7.6 11/02/2020 0623   RBC 4.43 11/02/2020 0623   HGB 12.1 (L) 11/02/2020 0623   HCT 38.1 (L) 11/02/2020 0623   PLT 298 11/02/2020 0623   MCV 86.0 11/02/2020 0623   MCH 27.3 11/02/2020 0623   MCHC 31.8 11/02/2020 0623   RDW 16.4 (H) 11/02/2020 0623   LYMPHSABS 1.0 10/21/2020 0033   MONOABS 0.5 10/21/2020 0033   EOSABS 0.1 10/21/2020 0033   BASOSABS 0.0 10/21/2020 0033   CMP     Component Value Date/Time   NA 136 11/02/2020 0623   K 4.1 11/02/2020 0623   CL 99 11/02/2020 0623   CO2 28 11/02/2020 0623   GLUCOSE 88 11/02/2020 0623   BUN 13 11/02/2020 0623   CREATININE 0.67 11/02/2020 0623   CALCIUM 8.1 (L) 11/02/2020 0623   PROT 5.8 (L) 11/02/2020 0623   ALBUMIN 2.7 (L) 11/02/2020 0623   AST 30 11/02/2020 0623   ALT 105 (H) 11/02/2020 0623   ALKPHOS 64 11/02/2020 0623   BILITOT 1.8 (H) 11/02/2020 0623   GFRNONAA >60 11/02/2020 0623   Lipid Panel     Component Value Date/Time   CHOL 103 10/27/2020 0414   TRIG 68 10/27/2020 0414   HDL 24 (L) 10/27/2020 0414   CHOLHDL 4.3 10/27/2020 0414   VLDL 14 10/27/2020 0414   LDLCALC 65 10/27/2020 0414   No results found for: HGBA1C Drugs of Abuse     Component Value Date/Time   LABOPIA NONE DETECTED 10/22/2020 1745   COCAINSCRNUR POSITIVE (A) 10/22/2020 1745   LABBENZ POSITIVE (A) 10/22/2020 1745   AMPHETMU NONE DETECTED 10/22/2020 1745   THCU POSITIVE (A) 10/22/2020 1745   LABBARB NONE DETECTED 10/22/2020 1745    Urinalysis    Component Value Date/Time    COLORURINE YELLOW 10/22/2020 1745   APPEARANCEUR CLEAR 10/22/2020 1745   LABSPEC 1.014 10/22/2020 1745   PHURINE 7.0 10/22/2020 1745   GLUCOSEU NEGATIVE 10/22/2020 1745   HGBUR NEGATIVE 10/22/2020 1745   BILIRUBINUR NEGATIVE 10/22/2020 1745   KETONESUR NEGATIVE 10/22/2020 1745   PROTEINUR NEGATIVE 10/22/2020 1745   NITRITE NEGATIVE 10/22/2020  1745   LEUKOCYTESUR TRACE (A) 10/22/2020 1745   Results for orders placed or performed during the hospital encounter of 10/20/20  SARS CORONAVIRUS 2 (TAT 6-24 HRS) Nasopharyngeal Nasopharyngeal Swab     Status: None   Collection Time: 10/21/20  2:00 AM   Specimen: Nasopharyngeal Swab  Result Value Ref Range Status   SARS Coronavirus 2 NEGATIVE NEGATIVE Final    Comment: (NOTE) SARS-CoV-2 target nucleic acids are NOT DETECTED.  The SARS-CoV-2 RNA is generally detectable in upper and lower respiratory specimens during the acute phase of infection. Negative results do not preclude SARS-CoV-2 infection, do not rule out co-infections with other pathogens, and should not be used as the sole basis for treatment or other patient management decisions. Negative results must be combined with clinical observations, patient history, and epidemiological information. The expected result is Negative.  Fact Sheet for Patients: HairSlick.no  Fact Sheet for Healthcare Providers: quierodirigir.com  This test is not yet approved or cleared by the [place] FDA and  has been authorized for detection and/or diagnosis of SARS-CoV-2 by FDA under an Emergency Use Authorization (EUA). This EUA will remain  in effect (meaning this test can be used) for the duration of the COVID-19 declaration under Se ction 564(b)(1) of the Act, 21 U.S.C. section 360bbb-3(b)(1), unless the authorization is terminated or revoked sooner.  Performed at [facility], [address], [place], [place] [address]    MRSA PCR Screening     Status: Abnormal   Collection Time: 10/25/20  6:30 AM   Specimen: Nasal Mucosa; Nasopharyngeal  Result Value Ref Range Status   MRSA by PCR POSITIVE (A) NEGATIVE Final    Comment:        The GeneXpert MRSA Assay (FDA approved for NASAL specimens only), is one component of a comprehensive MRSA colonization surveillance program. It is not intended to diagnose MRSA infection nor to guide or monitor treatment for MRSA infections. RESULT CALLED TO, READ BACK BY AND VERIFIED WITH: FOLEY,B. AT 0845 BY HUFFINES,S ON 10/25/20. Performed at [facility], [address], [place], [place] [address]   Culture, blood (routine x 2)     Status: Abnormal   Collection Time: 10/25/20  8:06 AM   Specimen: BLOOD  Result Value Ref Range Status   Specimen Description   Final    BLOOD RIGHT ANTECUBITAL Performed at [facility], [address], [place], [place] [address]    Special Requests   Final    BOTTLES DRAWN AEROBIC AND ANAEROBIC Blood Culture adequate volume Performed at [facility], [address] [place], [place] [address]    Culture  Setup Time   Final    AEROBIC BOTTLE ONLY GRAM POSITIVE COCCI Gram Stain Report Called to,Read Back By and Verified With: W COUNCIL,RN@0200  10/26/20 MKELLY CRITICAL VALUE NOTED.  VALUE IS CONSISTENT WITH PREVIOUSLY REPORTED AND CALLED VALUE.    Culture (A)  Final    STAPHYLOCOCCUS AUREUS SUSCEPTIBILITIES PERFORMED ON PREVIOUS CULTURE WITHIN THE LAST 5 DAYS. Performed at [facility], [address] [place], [place] [address]    Report Status 10/28/2020 FINAL  Final  Culture, blood (routine x 2)     Status: Abnormal   Collection Time: 10/25/20  8:06 AM   Specimen: BLOOD RIGHT WRIST  Result Value Ref Range Status   Specimen Description   Final    BLOOD RIGHT WRIST Performed at [facility], [address], [place], [place] [address]    Special Requests  Final    BOTTLES DRAWN AEROBIC AND ANAEROBIC Blood Culture  adequate volume Performed at [facility], [address], [place], [place] [address]    Culture  Setup Time   Final    AEROBIC BOTTLE ONLY GRAM POSITIVE COCCI Gram Stain Report Called to,Read Back By and Verified With: W COUNCIL,RN@2204  10/25/20 MKELLY CRITICAL RESULT CALLED TO, READ BACK BY AND VERIFIED WITH: PHARMD GREG ABBOTT 10/26/2020 AT 0440 A.HUGHES ANAEROBIC BOTTLE ALSO Performed at [facility], [address] [place], [place] [address]    Culture METHICILLIN RESISTANT STAPHYLOCOCCUS AUREUS (A)  Final   Report Status 10/28/2020 FINAL  Final   Organism ID, Bacteria METHICILLIN RESISTANT STAPHYLOCOCCUS AUREUS  Final      Susceptibility   Methicillin resistant staphylococcus aureus - MIC*    CIPROFLOXACIN <=0.5 SENSITIVE Sensitive     ERYTHROMYCIN >=8 RESISTANT Resistant     GENTAMICIN <=0.5 SENSITIVE Sensitive     OXACILLIN >=4 RESISTANT Resistant     TETRACYCLINE >=16 RESISTANT Resistant     VANCOMYCIN <=0.5 SENSITIVE Sensitive     TRIMETH/SULFA <=10 SENSITIVE Sensitive     CLINDAMYCIN <=0.25 SENSITIVE Sensitive     RIFAMPIN <=0.5 SENSITIVE Sensitive     Inducible Clindamycin NEGATIVE Sensitive     * METHICILLIN RESISTANT STAPHYLOCOCCUS AUREUS  Blood Culture ID Panel (Reflexed)     Status: Abnormal   Collection Time: 10/25/20  8:06 AM  Result Value Ref Range Status   Enterococcus faecalis NOT DETECTED NOT DETECTED Final   Enterococcus Faecium NOT DETECTED NOT DETECTED Final   Listeria monocytogenes NOT DETECTED NOT DETECTED Final   Staphylococcus species DETECTED (A) NOT DETECTED Final    Comment: RBV PHARMD GREG ABBOTT 10/26/2020 AT 0441 A.HUGHES    Staphylococcus aureus (BCID) DETECTED (A) NOT DETECTED Final    Comment: Methicillin (oxacillin)-resistant Staphylococcus aureus (MRSA). MRSA is predictably resistant to beta-lactam antibiotics (except ceftaroline). Preferred therapy is vancomycin unless clinically contraindicated. Patient requires contact precautions  if  hospitalized. RBV PHARMD GREG ABBOTT 10/26/2020 AT 0440 A.HUGHES    Staphylococcus epidermidis NOT DETECTED NOT DETECTED Final   Staphylococcus lugdunensis NOT DETECTED NOT DETECTED Final   Streptococcus species NOT DETECTED NOT DETECTED Final   Streptococcus agalactiae NOT DETECTED NOT DETECTED Final   Streptococcus pneumoniae NOT DETECTED NOT DETECTED Final   Streptococcus pyogenes NOT DETECTED NOT DETECTED Final   A.calcoaceticus-baumannii NOT DETECTED NOT DETECTED Final   Bacteroides fragilis NOT DETECTED NOT DETECTED Final   Enterobacterales NOT DETECTED NOT DETECTED Final   Enterobacter cloacae complex NOT DETECTED NOT DETECTED Final   Escherichia coli NOT DETECTED NOT DETECTED Final   Klebsiella aerogenes NOT DETECTED NOT DETECTED Final   Klebsiella oxytoca NOT DETECTED NOT DETECTED Final   Klebsiella pneumoniae NOT DETECTED NOT DETECTED Final   Proteus species NOT DETECTED NOT DETECTED Final   Salmonella species NOT DETECTED NOT DETECTED Final   Serratia marcescens NOT DETECTED NOT DETECTED Final   Haemophilus influenzae NOT DETECTED NOT DETECTED Final   Neisseria meningitidis NOT DETECTED NOT DETECTED Final   Pseudomonas aeruginosa NOT DETECTED NOT DETECTED Final   Stenotrophomonas maltophilia NOT DETECTED NOT DETECTED Final   Candida albicans NOT DETECTED NOT DETECTED Final   Candida auris NOT DETECTED NOT DETECTED Final   Candida glabrata NOT DETECTED NOT DETECTED Final   Candida krusei NOT DETECTED NOT DETECTED Final   Candida parapsilosis NOT DETECTED NOT DETECTED Final   Candida tropicalis NOT DETECTED NOT DETECTED Final   Cryptococcus neoformans/gattii NOT DETECTED NOT DETECTED  Final   Meth resistant mecA/C and MREJ DETECTED (A) NOT DETECTED Final    Comment: PHARMD GREG ABBOTT 10/26/2020 AT 0441 A.HUGHES CRITICAL RESULT CALLED TO, READ BACK BY AND VERIFIED WITH: Performed at [facility], [address] [place], [place] [address]   Culture, blood  (routine x 2)     Status: None   Collection Time: 10/27/20  6:00 AM   Specimen: BLOOD  Result Value Ref Range Status   Specimen Description BLOOD LEFT HAND  Final   Special Requests   Final    BOTTLES DRAWN AEROBIC AND ANAEROBIC Blood Culture adequate volume   Culture   Final    NO GROWTH 5 DAYS Performed at [facility], [address] [place], [place] [address]    Report Status 11/01/2020 FINAL  Final  Culture, blood (routine x 2)     Status: None   Collection Time: 10/27/20  6:05 AM   Specimen: BLOOD  Result Value Ref Range Status   Specimen Description BLOOD RIGHT FOREARM  Final   Special Requests   Final    BOTTLES DRAWN AEROBIC AND ANAEROBIC Blood Culture adequate volume   Culture   Final    NO GROWTH 5 DAYS Performed at [facility], [address] [place], [place] [address]    Report Status 11/01/2020 FINAL  Final    Lab Results  Component Value Date   TSH 3.217 10/23/2020   HIV negative 5/19  Imaging I have reviewed the images obtained:  CT-scan of the brain: 1. Mild for age white matter changes. No acute cortically based infarct or acute intracranial hemorrhage identified. ASPECTS 10. 2. Verrucas soft tissue thickening of the right scalp. Eroded nasal septum.  CT Angio Head and Neck: 1. No emergent large vessel occlusion. 2. No significant proximal intracranial stenosis. 3. Mild cervical carotid atherosclerosis without significant stenosis. 4. Patent vertebral arteries with the left being strongly dominant. 5.  Aortic Atherosclerosis (ICD10-I70.0).  CT Cerebral Perfusion: ASPECTS: 10 CBF (<30%) Volume: 0 mL Perfusion (Tmax>6.0s) volume: 0 mL  MRI examination of the brain: No acute finding. Normal study except for a few punctate foci of T2 and FLAIR signal within the cerebral hemispheric white matter consistent with minimal small vessel change.  Assessment: 60 year old male with PMHx as above who had acute onset of perseveration followed by  decreased verbalization with grunting this morning prior to cardiac catheterization. CT and MRI imaging without acute findings: infarct, significant stenosis, or hemorrhage and with mild for age white matter changes. CT perfusion without mismatch.   - Examination reveals patient with poor attention, some disorientation, and decreased verbalization with intermittent dysarthria and aphasia with an initial NIHSS of 5.  - Imaging without acute findings or significant stenosis.  - Presentation felt to be most consistent with delirium due to lengthy hospitalization with multiple complications, new onset heart failure, and history of polysubstance abuse versus withdrawal. Less likely is the concern for withdrawal seizures considering the length of hospitalization.   Impression: Acute encephalopathy; likely multifactorial versus delirium  Recommendations: - RPR, Vitamin B12 levels pending to evaluate other causes of treatable encephalopathy; thiamine not checked prior to starting supplementation here   -Please follow-up B12 level and supplement for goal B12 greater than 400  -Please follow-up RPR and treat if needed - Routine EEG  - Try to minimize deliriogenic medications as much as possible (J Am Geriatr Soc. 2012 Apr;60(4):616-31): benzodiazepines, anticholinergics, diphenhydramine, antihistamines, narcotics, Ambien/Lunesta/Sonata etc. - Environmental support for  delirium: Lights on during the day, patient up and out of bed as much as is feasible, OT/PT, quiet dimly lit room at night, reorient patient often, provide hearing aides and glasses if patient uses them routinely, minimize sleep disruptions as much as possible overnight.   -Neurology will follow up EEG; if this is negative for epileptiform activity will be available on an as-needed basis going forward.  Please reconsult if new questions arise  Lanae Boast, AGAC-NP [facility] Pager: [phone]  Attending Neurologist's  note:  I personally saw this patient, gathering history, performing a full neurologic examination, reviewing relevant labs, personally reviewing relevant imaging including head CT, CTA, MRI brain, and formulated the assessment and plan, adding the note above for completeness and clarity to accurately reflect my thoughts  Brooke Dare MD-PhD [facility] [phone] Available 7 AM to 7 PM, outside these hours please contact Neurologist on call listed on AMION

## 2020-11-02 NOTE — Procedures (Signed)
Patient Name: Kyle Peck  MRN: [id number]  Epilepsy Attending: Charlsie Quest  Referring Physician/Provider: Dr Brooke Dare Date: 11/02/2020 Duration: 22.31 mins  Patient history: 60 year old male who had acute onset of perseveration followed by decreased verbalization with grunting this morning prior to cardiac catheterization. EEG to evaluate for seizure  Level of alertness: Awake, drowsy  AEDs during EEG study: Temazepam  Technical aspects: This EEG study was done with scalp electrodes positioned according to the 10-20 International system of electrode placement. Electrical activity was acquired at a sampling rate of 500Hz  and reviewed with a high frequency filter of 70Hz  and a low frequency filter of 1Hz . EEG data were recorded continuously and digitally stored.   Description: The posterior dominant rhythm consists of 9 Hz activity of moderate voltage (25-35 uV) seen predominantly in posterior head regions, symmetric and reactive to eye opening and eye closing. Drowsiness was characterized by attenuation of the posterior background rhythm. Hyperventilation and photic stimulation were not performed.     IMPRESSION: This study is within normal limits. No seizures or epileptiform discharges were seen throughout the recording.  Keatyn Jawad 

## 2020-11-03 ENCOUNTER — Inpatient Hospital Stay (HOSPITAL_COMMUNITY): Payer: Self-pay

## 2020-11-03 ENCOUNTER — Encounter (HOSPITAL_COMMUNITY)
Admission: EM | Discharge status: Against Medical Advice | Payer: Self-pay | Source: Home / Self Care | Attending: Pulmonary Disease

## 2020-11-03 ENCOUNTER — Ambulatory Visit: Payer: Self-pay | Admitting: Family Medicine

## 2020-11-03 LAB — RENAL FUNCTION PANEL
Albumin: 2.7 g/dL — ABNORMAL LOW (ref 3.5–5.0)
Anion gap: 9 (ref 5–15)
BUN: 11 mg/dL (ref 6–20)
CO2: 25 mmol/L (ref 22–32)
Calcium: 8.4 mg/dL — ABNORMAL LOW (ref 8.9–10.3)
Chloride: 103 mmol/L (ref 98–111)
Creatinine, Ser: 0.65 mg/dL (ref 0.61–1.24)
GFR, Estimated: >60 mL/min (ref >60)
Glucose, Bld: 68 mg/dL — ABNORMAL LOW (ref 70–99)
Phosphorus: 4.4 mg/dL (ref 2.5–4.6)
Potassium: 3.9 mmol/L (ref 3.5–5.1)
Sodium: 137 mmol/L (ref 135–145)

## 2020-11-03 LAB — CBC
HCT: 37.2 % — ABNORMAL LOW (ref 39.0–52.0)
Hemoglobin: 11.5 g/dL — ABNORMAL LOW (ref 13.0–17.0)
MCH: 27.3 pg (ref 26.0–34.0)
MCHC: 30.9 g/dL (ref 30.0–36.0)
MCV: 88.4 fL (ref 80.0–100.0)
Platelets: 354 10*3/uL (ref 150–400)
RBC: 4.21 MIL/uL — ABNORMAL LOW (ref 4.22–5.81)
RDW: 16.5 % — ABNORMAL HIGH (ref 11.5–15.5)
WBC: 7.4 10*3/uL (ref 4.0–10.5)
nRBC: 0 % (ref 0.0–0.2)

## 2020-11-03 LAB — COOXEMETRY PANEL
Carboxyhemoglobin: 1.8 % — ABNORMAL HIGH (ref 0.5–1.5)
Methemoglobin: 0.9 % (ref 0.0–1.5)
O2 Saturation: 59.5 %
Total hemoglobin: 11.9 g/dL — ABNORMAL LOW (ref 12.0–16.0)

## 2020-11-03 LAB — RPR: RPR Ser Ql: NONREACTIVE

## 2020-11-03 LAB — MAGNESIUM: Magnesium: 2.1 mg/dL (ref 1.7–2.4)

## 2020-11-03 SURGERY — RIGHT/LEFT HEART CATH AND CORONARY ANGIOGRAPHY
Anesthesia: LOCAL

## 2020-11-03 MED ORDER — ORITAVANCIN DIPHOSPHATE 400 MG IV SOLR: 1200.0000 mg | Freq: Once | INTRAVENOUS | Status: DC

## 2020-11-03 MED ORDER — VANCOMYCIN HCL 1250 MG/250ML IV SOLN
1250.0000 mg | Freq: Two times a day (BID) | INTRAVENOUS | Status: DC
  Administered 2020-11-03 – 2020-11-04 (×2): 1250 mg via INTRAVENOUS
  Filled 2020-11-03 (×3): qty 250

## 2020-11-03 MED ORDER — LOSARTAN POTASSIUM 25 MG PO TABS
25.0000 mg | ORAL_TABLET | Freq: Every day | ORAL | Status: DC
  Administered 2020-11-03: 25 mg via ORAL
  Filled 2020-11-03: qty 1

## 2020-11-03 MED ORDER — SODIUM CHLORIDE 0.9% FLUSH
3.0000 mL | Freq: Two times a day (BID) | INTRAVENOUS | Status: DC
  Administered 2020-11-04: 3 mL via INTRAVENOUS

## 2020-11-03 MED ORDER — ASPIRIN 81 MG PO CHEW: 81.0000 mg | CHEWABLE_TABLET | ORAL | Status: DC

## 2020-11-03 NOTE — Progress Notes (Addendum)
Patient ID: Kyle Peck, male   DOB: April 22, 1961, 60 y.o.   MRN: [id number]     Advanced Heart Failure Rounding Note  PCP-Cardiologist: None   Subjective:    Transferred to [facility] from [facility] for shock treatment.  MRSA PNA/bacteremia, on vancomycin.  TEE 5/25: EF 20%, mild RV dysfunction, no endocarditis.  10/31/20 Extubated.   CODE stroke called yesterday after acute onset of perseveration followed by decreased verbalization. Neuro w/u including  CT and MRI of brain + CT angio of head and neck negative. EEG normal. Delirium suspected.   He remains delirious. Trying to pull IVs. Now w/ sitter and mittens. He is NPO. No PO cardiac meds since 5/30   Off DBA. Co-ox 60%. Wt down. Unable to get CVP assessment due to pt's position in bed. Delirium limiting exam.      Objective:   Weight Range: 78.2 kg Body mass index is 27 kg/m.   Vital Signs:   Temp:  [97.9 F (36.6 C)-99.1 F (37.3 C)] 99.1 F (37.3 C) (06/01 0358) Resp:  [17-22] 17 (06/01 0358) BP: (112-130)/(70-88) 112/70 (06/01 0358) SpO2:  [98 %] 98 % (05/31 2049) Weight:  [78.2 kg] 78.2 kg (06/01 0358) Last BM Date: 11/01/20  Weight change: Filed Weights   10/31/20 0615 11/02/20 0321 11/03/20 0358  Weight: 85.3 kg 83.9 kg 78.2 kg    Intake/Output:   Intake/Output Summary (Last 24 hours) at 11/03/2020 0857 Last data filed at 11/03/2020 0357 Gross per 24 hour  Intake 10 ml  Output 2350 ml  Net -2340 ml      Physical Exam   General:   Delirious this morning. Has mittens. Currently sleep but mild agitation w/ touch/stimulation    HEENT: normal Neck: supple. no JVD. Carotids 2+ bilat; no bruits. No lymphadenopathy or thryomegaly appreciated. Cor: PMI nondisplaced. Regular rate & rhythm. No rubs, gallops or murmurs. Lungs: clear Abdomen: soft, nontender, nondistended. No hepatosplenomegaly. No bruits or masses. Good bowel sounds. Extremities: no cyanosis, clubbing, rash, edema Neuro: Delirious this morning. Has mittens.  Currently sleep but mild agitation w/ touch/stimulation    Telemetry   NSR 90s    Labs    CBC Recent Labs    11/02/20 0623 11/03/20 0357  WBC 7.6 7.4  HGB 12.1* 11.5*  HCT 38.1* 37.2*  MCV 86.0 88.4  PLT 298 354   Basic Metabolic Panel Recent Labs    72/53/66 0623 11/03/20 0357  NA 136 137  K 4.1 3.9  CL 99 103  CO2 28 25  GLUCOSE 88 68*  BUN 13 11  CREATININE 0.67 0.65  CALCIUM 8.1* 8.4*  MG 2.0 2.1  PHOS 3.7 4.4   Liver Function Tests Recent Labs    11/02/20 0623 11/03/20 0357  AST 30  --   ALT 105*  --   ALKPHOS 64  --   BILITOT 1.8*  --   PROT 5.8*  --   ALBUMIN 2.7* 2.7*   No results for input(s): LIPASE, AMYLASE in the last 72 hours. Cardiac Enzymes No results for input(s): CKTOTAL, CKMB, CKMBINDEX, TROPONINI in the last 72 hours.  BNP: BNP (last 3 results) Recent Labs    10/21/20 0033 10/23/20 0504  BNP 2,252.0* 1,685.0*    ProBNP (last 3 results) No results for input(s): PROBNP in the last 8760 hours.   D-Dimer No results for input(s): DDIMER in the last 72 hours. Hemoglobin A1C No results for input(s): HGBA1C in the last 72 hours. Fasting Lipid Panel No results for input(s):  CHOL, HDL, LDLCALC, TRIG, CHOLHDL, LDLDIRECT in the last 72 hours. Thyroid Function Tests No results for input(s): TSH, T4TOTAL, T3FREE, THYROIDAB in the last 72 hours.  Invalid input(s): FREET3  Other results:   Imaging    EEG adult  Result Date: 11/02/2020 Charlsie Quest, MD     11/02/2020  6:08 PM Patient Name: Oseas Detty MRN: [id number] Epilepsy Attending: Charlsie Quest Referring Physician/Provider: Dr Brooke Dare Date: 11/02/2020 Duration: 22.31 mins Patient history: 60 year old male who had acute onset of perseveration followed by decreased verbalization with grunting this morning prior to cardiac catheterization. EEG to evaluate for seizure Level of alertness: Awake, drowsy AEDs during EEG study: Temazepam Technical aspects: This EEG  study was done with scalp electrodes positioned according to the 10-20 International system of electrode placement. Electrical activity was acquired at a sampling rate of 500Hz  and reviewed with a high frequency filter of 70Hz  and a low frequency filter of 1Hz . EEG data were recorded continuously and digitally stored. Description: The posterior dominant rhythm consists of 9 Hz activity of moderate voltage (25-35 uV) seen predominantly in posterior head regions, symmetric and reactive to eye opening and eye closing. Drowsiness was characterized by attenuation of the posterior background rhythm. Hyperventilation and photic stimulation were not performed.   IMPRESSION: This study is within normal limits. No seizures or epileptiform discharges were seen throughout the recording. Priyanka     Medications:     Scheduled Medications: . aspirin  81 mg Oral Pre-Cath  . chlordiazePOXIDE  10 mg Oral TID  . Chlorhexidine Gluconate Cloth  6 each Topical Daily  . digoxin  0.125 mg Oral Daily  . docusate sodium  100 mg Oral BID  . enoxaparin (LOVENOX) injection  40 mg Subcutaneous Q24H  . feeding supplement  237 mL Oral TID BM  . folic acid  1 mg Oral Daily  . furosemide  40 mg Oral Daily  . multivitamin with minerals  1 tablet Oral Daily  . pantoprazole (PROTONIX) IV  40 mg Intravenous Q24H  . polyethylene glycol  17 g Oral BID  . QUEtiapine  100 mg Oral BID  . sodium chloride flush  10-40 mL Intracatheter Q12H  . sodium chloride flush  3 mL Intravenous Q12H  . spironolactone  12.5 mg Oral Daily  . tamsulosin  0.4 mg Oral Daily  . temazepam  30 mg Oral QHS  . thiamine  100 mg Oral Daily   Or  . thiamine  100 mg Intravenous Daily    Infusions: . sodium chloride Stopped (11/01/20 0821)  . sodium chloride    . sodium chloride 10 mL/hr at 11/02/20 0622  . vancomycin 1,250 mg (11/03/20 0304)    PRN Medications: sodium chloride, acetaminophen (TYLENOL) oral liquid 160 mg/5 mL,  diphenhydrAMINE, guaiFENesin-dextromethorphan, haloperidol lactate, ondansetron (ZOFRAN) IV, sodium chloride flush, sodium chloride flush     Assessment/Plan   1. Shock: Suspect mixed cardiogenic and septic with MRSA on blood cultures.  - Off NE and DBA. Co-ox 60%  2. Acute systolic CHF: Echo this admission with EF 15%, moderate LV dilation, mildly decreased RV function, mild MR. Cause uncertain, could be due to ETOH abuse and cocaine but cannot rule out CAD. Presentation consistent with CHF, not ACS.  HS-TnI in setting of hypotension/shock was 204 => 141. Now off CVVH (stopped 5/26). Now off dobutamine  - CO- OX  75%>>60%. Renal function stable. Repeat Co-ox in am. - PO cardiac meds currently on hold due to acute delirium/  NPO status  - once safe to resume POs, continue HF regimen  - Continue spiro 12.5.  - Continue dig - Consider ARNI/ARB post cath if BP tolerates.  - Will have to postpone cath again today   3. AKI: Creatinine up to 2.09 with persistent hyperkalemia and minimal UOP. Cr now stable 0.7. Now off CVVH and making urine.    - Renal has signed off 4. ETOH abuse with withdrawal.  - IM managing 5. Cocaine abuse: likely contributing to WD 6. Acute hypoxemic respiratory failure: Initially in setting of ETOH withdrawal and altered mental status. Extubated 10/31/20 7. ID: MRSA in blood, ?PNA source.  PCT 1.5 => 1.04.  TEE 5/25 showed no endocarditis.  - Continue vancomycin 8. NSVT - keep K> 4.0 Mg > 2.0 9. AMS/ Acute Delirium  - Stroke w/u negative. EEG unremarkable.  - IM managing, continue w/ sitter    Robbie Lis, PA-C  11/03/2020 8:57 AM  Patient seen with PA, agree with the above note.   He seems to be doing better currently, able to tell me where he is and why he is here.  The sitter thinks he is much more clear currently as well.   CVP 9, co-ox 60%.  Overall seems to be stable from cardiac perspective.   General: NAD Neck: No JVD, no thyromegaly or thyroid  nodule.  Lungs: Clear to auscultation bilaterally with normal respiratory effort. CV: Nondisplaced PMI.  Heart regular S1/S2, no S3/S4, no murmur.  No peripheral edema.   Abdomen: Soft, nontender, no hepatosplenomegaly, no distention.  Skin: Intact without lesions or rashes.  Neurologic: Alert and oriented x 3.  Psych: Normal affect. Extremities: No clubbing or cyanosis.  HEENT: Normal.   Continue gradual uptitration of cardiac meds.  Will add losartan 25 mg daily today.  If BP stable with this, can eventually transition to [facility].  Continue Lasix 40 mg po daily.   He is much more clear currently.  If this continues, can do cath tomorrow.  Discussed risks/benefits with patient and he agrees to procedure.   Marca Ancona 11/03/2020 12:40 PM

## 2020-11-03 NOTE — H&P (View-Only) (Signed)
Patient ID: Kyle Peck, male   DOB: April 22, 1961, 60 y.o.   MRN: [id number]     Advanced Heart Failure Rounding Note  PCP-Cardiologist: None   Subjective:    Transferred to [facility] from [facility] for shock treatment.  MRSA PNA/bacteremia, on vancomycin.  TEE 5/25: EF 20%, mild RV dysfunction, no endocarditis.  10/31/20 Extubated.   CODE stroke called yesterday after acute onset of perseveration followed by decreased verbalization. Neuro w/u including  CT and MRI of brain + CT angio of head and neck negative. EEG normal. Delirium suspected.   He remains delirious. Trying to pull IVs. Now w/ sitter and mittens. He is NPO. No PO cardiac meds since 5/30   Off DBA. Co-ox 60%. Wt down. Unable to get CVP assessment due to pt's position in bed. Delirium limiting exam.      Objective:   Weight Range: 78.2 kg Body mass index is 27 kg/m.   Vital Signs:   Temp:  [97.9 F (36.6 C)-99.1 F (37.3 C)] 99.1 F (37.3 C) (06/01 0358) Resp:  [17-22] 17 (06/01 0358) BP: (112-130)/(70-88) 112/70 (06/01 0358) SpO2:  [98 %] 98 % (05/31 2049) Weight:  [78.2 kg] 78.2 kg (06/01 0358) Last BM Date: 11/01/20  Weight change: Filed Weights   10/31/20 0615 11/02/20 0321 11/03/20 0358  Weight: 85.3 kg 83.9 kg 78.2 kg    Intake/Output:   Intake/Output Summary (Last 24 hours) at 11/03/2020 0857 Last data filed at 11/03/2020 0357 Gross per 24 hour  Intake 10 ml  Output 2350 ml  Net -2340 ml      Physical Exam   General:   Delirious this morning. Has mittens. Currently sleep but mild agitation w/ touch/stimulation    HEENT: normal Neck: supple. no JVD. Carotids 2+ bilat; no bruits. No lymphadenopathy or thryomegaly appreciated. Cor: PMI nondisplaced. Regular rate & rhythm. No rubs, gallops or murmurs. Lungs: clear Abdomen: soft, nontender, nondistended. No hepatosplenomegaly. No bruits or masses. Good bowel sounds. Extremities: no cyanosis, clubbing, rash, edema Neuro: Delirious this morning. Has mittens.  Currently sleep but mild agitation w/ touch/stimulation    Telemetry   NSR 90s    Labs    CBC Recent Labs    11/02/20 0623 11/03/20 0357  WBC 7.6 7.4  HGB 12.1* 11.5*  HCT 38.1* 37.2*  MCV 86.0 88.4  PLT 298 354   Basic Metabolic Panel Recent Labs    72/53/66 0623 11/03/20 0357  NA 136 137  K 4.1 3.9  CL 99 103  CO2 28 25  GLUCOSE 88 68*  BUN 13 11  CREATININE 0.67 0.65  CALCIUM 8.1* 8.4*  MG 2.0 2.1  PHOS 3.7 4.4   Liver Function Tests Recent Labs    11/02/20 0623 11/03/20 0357  AST 30  --   ALT 105*  --   ALKPHOS 64  --   BILITOT 1.8*  --   PROT 5.8*  --   ALBUMIN 2.7* 2.7*   No results for input(s): LIPASE, AMYLASE in the last 72 hours. Cardiac Enzymes No results for input(s): CKTOTAL, CKMB, CKMBINDEX, TROPONINI in the last 72 hours.  BNP: BNP (last 3 results) Recent Labs    10/21/20 0033 10/23/20 0504  BNP 2,252.0* 1,685.0*    ProBNP (last 3 results) No results for input(s): PROBNP in the last 8760 hours.   D-Dimer No results for input(s): DDIMER in the last 72 hours. Hemoglobin A1C No results for input(s): HGBA1C in the last 72 hours. Fasting Lipid Panel No results for input(s):  CHOL, HDL, LDLCALC, TRIG, CHOLHDL, LDLDIRECT in the last 72 hours. Thyroid Function Tests No results for input(s): TSH, T4TOTAL, T3FREE, THYROIDAB in the last 72 hours.  Invalid input(s): FREET3  Other results:   Imaging    EEG adult  Result Date: 11/02/2020 Charlsie Quest, MD     11/02/2020  6:08 PM Patient Name: Kyle Peck MRN: [id number] Epilepsy Attending: Charlsie Quest Referring Physician/Provider: Dr Brooke Dare Date: 11/02/2020 Duration: 22.31 mins Patient history: 60 year old male who had acute onset of perseveration followed by decreased verbalization with grunting this morning prior to cardiac catheterization. EEG to evaluate for seizure Level of alertness: Awake, drowsy AEDs during EEG study: Temazepam Technical aspects: This EEG  study was done with scalp electrodes positioned according to the 10-20 International system of electrode placement. Electrical activity was acquired at a sampling rate of 500Hz  and reviewed with a high frequency filter of 70Hz  and a low frequency filter of 1Hz . EEG data were recorded continuously and digitally stored. Description: The posterior dominant rhythm consists of 9 Hz activity of moderate voltage (25-35 uV) seen predominantly in posterior head regions, symmetric and reactive to eye opening and eye closing. Drowsiness was characterized by attenuation of the posterior background rhythm. Hyperventilation and photic stimulation were not performed.   IMPRESSION: This study is within normal limits. No seizures or epileptiform discharges were seen throughout the recording. Priyanka     Medications:     Scheduled Medications: . aspirin  81 mg Oral Pre-Cath  . chlordiazePOXIDE  10 mg Oral TID  . Chlorhexidine Gluconate Cloth  6 each Topical Daily  . digoxin  0.125 mg Oral Daily  . docusate sodium  100 mg Oral BID  . enoxaparin (LOVENOX) injection  40 mg Subcutaneous Q24H  . feeding supplement  237 mL Oral TID BM  . folic acid  1 mg Oral Daily  . furosemide  40 mg Oral Daily  . multivitamin with minerals  1 tablet Oral Daily  . pantoprazole (PROTONIX) IV  40 mg Intravenous Q24H  . polyethylene glycol  17 g Oral BID  . QUEtiapine  100 mg Oral BID  . sodium chloride flush  10-40 mL Intracatheter Q12H  . sodium chloride flush  3 mL Intravenous Q12H  . spironolactone  12.5 mg Oral Daily  . tamsulosin  0.4 mg Oral Daily  . temazepam  30 mg Oral QHS  . thiamine  100 mg Oral Daily   Or  . thiamine  100 mg Intravenous Daily    Infusions: . sodium chloride Stopped (11/01/20 0821)  . sodium chloride    . sodium chloride 10 mL/hr at 11/02/20 0622  . vancomycin 1,250 mg (11/03/20 0304)    PRN Medications: sodium chloride, acetaminophen (TYLENOL) oral liquid 160 mg/5 mL,  diphenhydrAMINE, guaiFENesin-dextromethorphan, haloperidol lactate, ondansetron (ZOFRAN) IV, sodium chloride flush, sodium chloride flush     Assessment/Plan   1. Shock: Suspect mixed cardiogenic and septic with MRSA on blood cultures.  - Off NE and DBA. Co-ox 60%  2. Acute systolic CHF: Echo this admission with EF 15%, moderate LV dilation, mildly decreased RV function, mild MR. Cause uncertain, could be due to ETOH abuse and cocaine but cannot rule out CAD. Presentation consistent with CHF, not ACS.  HS-TnI in setting of hypotension/shock was 204 => 141. Now off CVVH (stopped 5/26). Now off dobutamine  - CO- OX  75%>>60%. Renal function stable. Repeat Co-ox in am. - PO cardiac meds currently on hold due to acute delirium/  NPO status  - once safe to resume POs, continue HF regimen  - Continue spiro 12.5.  - Continue dig - Consider ARNI/ARB post cath if BP tolerates.  - Will have to postpone cath again today   3. AKI: Creatinine up to 2.09 with persistent hyperkalemia and minimal UOP. Cr now stable 0.7. Now off CVVH and making urine.    - Renal has signed off 4. ETOH abuse with withdrawal.  - IM managing 5. Cocaine abuse: likely contributing to WD 6. Acute hypoxemic respiratory failure: Initially in setting of ETOH withdrawal and altered mental status. Extubated 10/31/20 7. ID: MRSA in blood, ?PNA source.  PCT 1.5 => 1.04.  TEE 5/25 showed no endocarditis.  - Continue vancomycin 8. NSVT - keep K> 4.0 Mg > 2.0 9. AMS/ Acute Delirium  - Stroke w/u negative. EEG unremarkable.  - IM managing, continue w/ sitter    Robbie Lis, PA-C  11/03/2020 8:57 AM  Patient seen with PA, agree with the above note.   He seems to be doing better currently, able to tell me where he is and why he is here.  The sitter thinks he is much more clear currently as well.   CVP 9, co-ox 60%.  Overall seems to be stable from cardiac perspective.   General: NAD Neck: No JVD, no thyromegaly or thyroid  nodule.  Lungs: Clear to auscultation bilaterally with normal respiratory effort. CV: Nondisplaced PMI.  Heart regular S1/S2, no S3/S4, no murmur.  No peripheral edema.   Abdomen: Soft, nontender, no hepatosplenomegaly, no distention.  Skin: Intact without lesions or rashes.  Neurologic: Alert and oriented x 3.  Psych: Normal affect. Extremities: No clubbing or cyanosis.  HEENT: Normal.   Continue gradual uptitration of cardiac meds.  Will add losartan 25 mg daily today.  If BP stable with this, can eventually transition to [facility].  Continue Lasix 40 mg po daily.   He is much more clear currently.  If this continues, can do cath tomorrow.  Discussed risks/benefits with patient and he agrees to procedure.   Marca Ancona 11/03/2020 12:40 PM

## 2020-11-03 NOTE — Progress Notes (Signed)
   11/02/20 2322  Assess: MEWS Score  Level of Consciousness New agitation confusion (Not new just continued agitation from beginning of shift.)  Assess: MEWS Score  MEWS Temp 0  MEWS Systolic 0  MEWS Pulse 2  MEWS RR 1  MEWS LOC 1  MEWS Score 4  MEWS Score Color Red  Assess: if the MEWS score is Yellow or Red  Were vital signs taken at a resting state? Yes  Focused Assessment No change from prior assessment  Early Detection of Sepsis Score *See Row Information* Low  MEWS guidelines implemented *See Row Information* No, other (Comment) (Continued Q4 vitals)  Treat  MEWS Interventions Administered scheduled meds/treatments  Neuro symptoms relieved by Anti-anxiety medication  Notify: Charge Nurse/RN  Name of Charge Nurse/RN Notified Caesar Bookman (Discussed with charge nurse)  Date Charge Nurse/RN Notified 11/03/20  Time Charge Nurse/RN Notified 2322  Document  Patient Outcome Other (Comment)  Progress note created (see row info) Yes

## 2020-11-03 NOTE — Progress Notes (Signed)
Physical Therapy Treatment Patient Details Name: Kyle Peck MRN: [id number] DOB: 05/28/1961 Today's Date: 11/03/2020    History of Present Illness Pt is a 60 y.o. male admitted 10/20/20 with LE/abdominal swelling. Workup for HF, cardiogenic shock. Echo showed EF 15% with global hypokinesis. Transfer to ICU 5/22, s/p HD cath placement for CRRT initiation. ETT 5/23-5/29. Course complicated by AKI, ETOH withdrawal, hypotension requiring pressors. CRRT discontinued 5/28. Code stroke called prior to cardiac cath on 5/31 due to abnormal speech; head CT negative, limited MRI negative. PMH includes polysubstance abuse.   PT Comments    Pt progressing with mobility. Today's session focused on continued transfer and gait training, pt with preference to use RW for added stability. Pt requires intermittent min guard and frequent verbal cues for safety. Encouraged more frequent ambulation with nursing staff (RN and NT sitter notified). Will continue to follow acutely to address established goals.    Follow Up Recommendations  Outpatient PT;Supervision/Assistance - 24 hour     Equipment Recommendations  Rolling walker with 5" wheels    Recommendations for Other Services       Precautions / Restrictions Precautions Precautions: Fall Restrictions Weight Bearing Restrictions: No    Mobility  Bed Mobility               General bed mobility comments: received sitting in recliner    Transfers Overall transfer level: Needs assistance Equipment used: None Transfers: Sit to/from Stand Sit to Stand: Min guard         General transfer comment: Min guard for safety  Ambulation/Gait Ambulation/Gait assistance: Min guard Gait Distance (Feet): 460 Feet Assistive device: None;Rolling walker (2 wheeled) Gait Pattern/deviations: Step-through pattern;Decreased stride length;Trunk flexed;Drifts right/left Gait velocity: Decreased   General Gait Details: Pt walked initial 12' without DME, min  guard for balance and reaching to furniture for UE support; pt requesting use of RW for added stability, hallway ambulation with RW and intermittent min guard for balance; pt requries frequent cues to maintain closer proximity to RW and upright posture; pt frequently looking around and into pt's rooms, drifting R/L with this and veering outside of RW, frequent cues to reposition   Stairs             Wheelchair Mobility    Modified Rankin (Stroke Patients Only)       Balance Overall balance assessment: Needs assistance Sitting-balance support: No upper extremity supported;Feet supported Sitting balance-Leahy Scale: Good     Standing balance support: No upper extremity supported;During functional activity Standing balance-Leahy Scale: Fair Standing balance comment: Can static stand and take steps without UE support; static and dynamic stability improved with RW                            Cognition Arousal/Alertness: Awake/alert Behavior During Therapy: WFL for tasks assessed/performed;Flat affect Overall Cognitive Status: No family/caregiver present to determine baseline cognitive functioning Area of Impairment: Attention;Following commands;Safety/judgement;Awareness;Problem solving                   Current Attention Level: Selective   Following Commands: Follows one step commands consistently;Follows multi-step commands inconsistently Safety/Judgement: Decreased awareness of deficits;Decreased awareness of safety Awareness: Emergent Problem Solving: Requires verbal cues General Comments: Flat affect with decreased verbalizations (was hyperverbal during PT evaluation). Pt with decreased awareness requiring intermittent cues for safety. Poor attention, easily distracted in hallway environment      Exercises Other Exercises Other Exercises: Chair push-ups (pt c/o  buttocks pain, educ on chair pushups for pressure relief to backside, pt able to perform with  BUE/BLE support)    General Comments General comments (skin integrity, edema, etc.): HR 120s with activity      Pertinent Vitals/Pain Pain Assessment: Faces Faces Pain Scale: Hurts little more Pain Location: Bottom Pain Descriptors / Indicators: Discomfort;Grimacing Pain Intervention(s): Monitored during session;Repositioned    Home Living                      Prior Function            PT Goals (current goals can now be found in the care plan section) Progress towards PT goals: Progressing toward goals    Frequency    Min 3X/week      PT Plan Current plan remains appropriate    Co-evaluation              AM-PAC PT "6 Clicks" Mobility   Outcome Measure  Help needed turning from your back to your side while in a flat bed without using bedrails?: None Help needed moving from lying on your back to sitting on the side of a flat bed without using bedrails?: A Little Help needed moving to and from a bed to a chair (including a wheelchair)?: A Little Help needed standing up from a chair using your arms (e.g., wheelchair or bedside chair)?: A Little Help needed to walk in hospital room?: A Little Help needed climbing 3-5 steps with a railing? : A Little 6 Click Score: 19    End of Session Equipment Utilized During Treatment: Gait belt Activity Tolerance: Patient tolerated treatment well Patient left: in chair;with call bell/phone within reach;with nursing/sitter in room Radiographer, therapeutic) Nurse Communication: Mobility status PT Visit Diagnosis: Other abnormalities of gait and mobility (R26.89);Difficulty in walking, not elsewhere classified (R26.2)     Time: 1352-1410 PT Time Calculation (min) (ACUTE ONLY): 18 min  Charges:  $Gait Training: 8-22 mins                     Ina Homes, PT, DPT Acute Rehabilitation Services  Pager [phone] Office [phone]  Kyle Peck 11/03/2020, 3:42 PM

## 2020-11-03 NOTE — Progress Notes (Signed)
[facility] Infectious Diseases Follow Up Note  Patient Identification: Patient Name: Kyle Peck MRN: [id number] Admit Date: 10/20/2020 11:52 PM Age: 60 y.o.Today's Date: 11/03/2020  Reason for Visit: MRSA bacteremia  Principal Problem:   Elevated brain natriuretic peptide (BNP) level Active Problems:   CHF (congestive heart failure) (HCC)   Transaminitis   Thrombocytosis   Hypoalbuminemia due to protein-calorie malnutrition (HCC)   Tobacco use   Pressure injury of skin   Acute respiratory failure (HCC)   Cardiogenic shock (HCC)   Hyperkalemia   Acute renal failure (ARF) (HCC)   Acute encephalopathy   Polysubstance abuse (HCC)   ETOH abuse   MRSA bacteremia   Cocaine abuse (HCC)  Antibiotics:Vancomycin 5/18-current  Lines/Tubes:left IJ catheter,PIV's  Interval Events: Afebrile, no leukocytosis, continues to be delirious.  Neurology consulted   Assessment Encephalopathy, possible delirium: Mental status seems to be better than yesterday .  EEG negative for seizures or epileptiform activity.  CT head/MRI brain and CTA head/neck unremarkable  Neurology following  Complicated MRSA bacteremia(possible nosocomial origin) -TEE and TEE negative for vegetation.  Acute systolic CHF-cardiology following, plan for cath today Polysubstance abuse  Recommendations Continue vancomycin,, pharmacy to dose. Ideally I would prefer for 4 weeks of IV antibiotics given complicated bacteremia from date of negative blood cultures on 5/25 with no obvious source.  Given his h/o polysubstance use, if this is not feasible, I would give at least 2 weeks of IV antibiotics from 5/25 followed by one dose of Oritavancin to be given upon discharge  Left IJ CVC needs to be removed if no longer required, ?  Switch to a PICC line for long-term IV antibiotics Monitor CBC and CMP on IV antibiotics Delirium management per primary  CHF  management per Cardiology and primary  Will sign off for now. Please call with questions   Rest of the management as per the primary team. Thank you for the consult. Please page with pertinent questions or concerns.  ______________________________________________________________________ Subjective patient seen and examined at the bedside.  He was just put to sleep by the RN.  Per RN, he continues to be confused, delirious and biting his mittens.  I did not wake him up  Vitals BP 113/63 (BP Location: Left Arm)   Pulse 99   Temp 99 F (37.2 C) (Oral)   Resp (!) 24   Ht 5\' 7"  (1.702 m)   Wt 78.2 kg   SpO2 99%   BMI 27.00 kg/m     Physical Exam Lying in the right side, sleeping, not in acute distress, has mittens in the hands Posterior lung sounds with bilateral equal air entry  Pertinent Microbiology  Results for orders placed or performed during the hospital encounter of 10/20/20  SARS CORONAVIRUS 2 (TAT 6-24 HRS) Nasopharyngeal Nasopharyngeal Swab     Status: None   Collection Time: 10/21/20  2:00 AM   Specimen: Nasopharyngeal Swab  Result Value Ref Range Status   SARS Coronavirus 2 NEGATIVE NEGATIVE Final    Comment: (NOTE) SARS-CoV-2 target nucleic acids are NOT DETECTED.  The SARS-CoV-2 RNA is generally detectable in upper and lower respiratory specimens during the acute phase of infection. Negative results do not preclude SARS-CoV-2 infection, do not rule out co-infections with other pathogens, and should not be used as the sole basis for treatment or other patient management decisions. Negative results must be combined with clinical observations, patient history, and epidemiological information. The expected result is Negative.  Fact Sheet for Patients: 10/23/20  Fact Sheet  for Healthcare Providers: quierodirigir.com  This test is not yet approved or cleared by the [place] and  has been  authorized for detection and/or diagnosis of SARS-CoV-2 by FDA under an Emergency Use Authorization (EUA). This EUA will remain  in effect (meaning this test can be used) for the duration of the COVID-19 declaration under Se ction 564(b)(1) of the Act, 21 U.S.C. section 360bbb-3(b)(1), unless the authorization is terminated or revoked sooner.  Performed at [facility], [address] [place], [place] [address]   MRSA PCR Screening     Status: Abnormal   Collection Time: 10/25/20  6:30 AM   Specimen: Nasal Mucosa; Nasopharyngeal  Result Value Ref Range Status   MRSA by PCR POSITIVE (A) NEGATIVE Final    Comment:        The GeneXpert MRSA Assay (FDA approved for NASAL specimens only), is one component of a comprehensive MRSA colonization surveillance program. It is not intended to diagnose MRSA infection nor to guide or monitor treatment for MRSA infections. RESULT CALLED TO, READ BACK BY AND VERIFIED WITH: FOLEY,B. AT 0845 BY HUFFINES,S ON 10/25/20. Performed at [facility], [address] [place], [place] [address]   Culture, blood (routine x 2)     Status: Abnormal   Collection Time: 10/25/20  8:06 AM   Specimen: BLOOD  Result Value Ref Range Status   Specimen Description   Final    BLOOD RIGHT ANTECUBITAL Performed at [facility], [address], [place], [place] [address]    Special Requests   Final    BOTTLES DRAWN AEROBIC AND ANAEROBIC Blood Culture adequate volume Performed at [facility], [address], [place], [place] [address]    Culture  Setup Time   Final    AEROBIC BOTTLE ONLY GRAM POSITIVE COCCI Gram Stain Report Called to,Read Back By and Verified With: W COUNCIL,RN@0200  10/26/20 MKELLY CRITICAL VALUE NOTED.  VALUE IS CONSISTENT WITH PREVIOUSLY REPORTED AND CALLED VALUE.    Culture (A)  Final    STAPHYLOCOCCUS AUREUS SUSCEPTIBILITIES PERFORMED ON PREVIOUS CULTURE WITHIN THE LAST 5 DAYS. Performed at [facility], [address],  [place], [place] [address]    Report Status 10/28/2020 FINAL  Final  Culture, blood (routine x 2)     Status: Abnormal   Collection Time: 10/25/20  8:06 AM   Specimen: BLOOD RIGHT WRIST  Result Value Ref Range Status   Specimen Description   Final    BLOOD RIGHT WRIST Performed at [facility], [address], [place], [place] [address]    Special Requests   Final    BOTTLES DRAWN AEROBIC AND ANAEROBIC Blood Culture adequate volume Performed at [facility], [address], [place], [place] [address]    Culture  Setup Time   Final    AEROBIC BOTTLE ONLY GRAM POSITIVE COCCI Gram Stain Report Called to,Read Back By and Verified With: W COUNCIL,RN@2204  10/25/20 MKELLY CRITICAL RESULT CALLED TO, READ BACK BY AND VERIFIED WITH: PHARMD GREG ABBOTT 10/26/2020 AT 0440 A.HUGHES ANAEROBIC BOTTLE ALSO Performed at [facility], [address] [place], [place] [address]    Culture METHICILLIN RESISTANT STAPHYLOCOCCUS AUREUS (A)  Final   Report Status 10/28/2020 FINAL  Final   Organism ID, Bacteria METHICILLIN RESISTANT STAPHYLOCOCCUS AUREUS  Final      Susceptibility   Methicillin resistant staphylococcus aureus - MIC*    CIPROFLOXACIN <=0.5 SENSITIVE Sensitive     ERYTHROMYCIN >=8 RESISTANT Resistant  GENTAMICIN <=0.5 SENSITIVE Sensitive     OXACILLIN >=4 RESISTANT Resistant     TETRACYCLINE >=16 RESISTANT Resistant     VANCOMYCIN <=0.5 SENSITIVE Sensitive     TRIMETH/SULFA <=10 SENSITIVE Sensitive     CLINDAMYCIN <=0.25 SENSITIVE Sensitive     RIFAMPIN <=0.5 SENSITIVE Sensitive     Inducible Clindamycin NEGATIVE Sensitive     * METHICILLIN RESISTANT STAPHYLOCOCCUS AUREUS  Blood Culture ID Panel (Reflexed)     Status: Abnormal   Collection Time: 10/25/20  8:06 AM  Result Value Ref Range Status   Enterococcus faecalis NOT DETECTED NOT DETECTED Final   Enterococcus Faecium NOT DETECTED NOT DETECTED Final   Listeria monocytogenes NOT DETECTED NOT DETECTED Final    Staphylococcus species DETECTED (A) NOT DETECTED Final    Comment: RBV PHARMD GREG ABBOTT 10/26/2020 AT 0441 A.HUGHES    Staphylococcus aureus (BCID) DETECTED (A) NOT DETECTED Final    Comment: Methicillin (oxacillin)-resistant Staphylococcus aureus (MRSA). MRSA is predictably resistant to beta-lactam antibiotics (except ceftaroline). Preferred therapy is vancomycin unless clinically contraindicated. Patient requires contact precautions if  hospitalized. RBV PHARMD GREG ABBOTT 10/26/2020 AT 0440 A.HUGHES    Staphylococcus epidermidis NOT DETECTED NOT DETECTED Final   Staphylococcus lugdunensis NOT DETECTED NOT DETECTED Final   Streptococcus species NOT DETECTED NOT DETECTED Final   Streptococcus agalactiae NOT DETECTED NOT DETECTED Final   Streptococcus pneumoniae NOT DETECTED NOT DETECTED Final   Streptococcus pyogenes NOT DETECTED NOT DETECTED Final   A.calcoaceticus-baumannii NOT DETECTED NOT DETECTED Final   Bacteroides fragilis NOT DETECTED NOT DETECTED Final   Enterobacterales NOT DETECTED NOT DETECTED Final   Enterobacter cloacae complex NOT DETECTED NOT DETECTED Final   Escherichia coli NOT DETECTED NOT DETECTED Final   Klebsiella aerogenes NOT DETECTED NOT DETECTED Final   Klebsiella oxytoca NOT DETECTED NOT DETECTED Final   Klebsiella pneumoniae NOT DETECTED NOT DETECTED Final   Proteus species NOT DETECTED NOT DETECTED Final   Salmonella species NOT DETECTED NOT DETECTED Final   Serratia marcescens NOT DETECTED NOT DETECTED Final   Haemophilus influenzae NOT DETECTED NOT DETECTED Final   Neisseria meningitidis NOT DETECTED NOT DETECTED Final   Pseudomonas aeruginosa NOT DETECTED NOT DETECTED Final   Stenotrophomonas maltophilia NOT DETECTED NOT DETECTED Final   Candida albicans NOT DETECTED NOT DETECTED Final   Candida auris NOT DETECTED NOT DETECTED Final   Candida glabrata NOT DETECTED NOT DETECTED Final   Candida krusei NOT DETECTED NOT DETECTED Final   Candida  parapsilosis NOT DETECTED NOT DETECTED Final   Candida tropicalis NOT DETECTED NOT DETECTED Final   Cryptococcus neoformans/gattii NOT DETECTED NOT DETECTED Final   Meth resistant mecA/C and MREJ DETECTED (A) NOT DETECTED Final    Comment: PHARMD GREG ABBOTT 10/26/2020 AT 0441 A.HUGHES CRITICAL RESULT CALLED TO, READ BACK BY AND VERIFIED WITH: Performed at [facility], [address] [place], [place] [address]   Culture, blood (routine x 2)     Status: None   Collection Time: 10/27/20  6:00 AM   Specimen: BLOOD  Result Value Ref Range Status   Specimen Description BLOOD LEFT HAND  Final   Special Requests   Final    BOTTLES DRAWN AEROBIC AND ANAEROBIC Blood Culture adequate volume   Culture   Final    NO GROWTH 5 DAYS Performed at [facility], [address] [place], [place] [address]    Report Status 11/01/2020 FINAL  Final  Culture, blood (routine x 2)     Status: None  Collection Time: 10/27/20  6:05 AM   Specimen: BLOOD  Result Value Ref Range Status   Specimen Description BLOOD RIGHT FOREARM  Final   Special Requests   Final    BOTTLES DRAWN AEROBIC AND ANAEROBIC Blood Culture adequate volume   Culture   Final    NO GROWTH 5 DAYS Performed at [facility], [address] [place], [place] [address]    Report Status 11/01/2020 FINAL  Final     Pertinent labs  CBC Latest Ref Rng & Units 11/03/2020 11/02/2020 10/28/2020  WBC 4.0 - 10.5 K/uL 7.4 7.6 8.4  Hemoglobin 13.0 - 17.0 g/dL 11.5(L) 12.1(L) 10.8(L)  Hematocrit 39.0 - 52.0 % 37.2(L) 38.1(L) 36.6(L)  Platelets 150 - 400 K/uL 354 298 214   CMP Latest Ref Rng & Units 11/03/2020 11/02/2020 11/01/2020  Glucose 70 - 99 mg/dL 60(A68(L) 88 540(J120(H)  BUN 6 - 20 mg/dL 11 13 16   Creatinine 0.61 - 1.24 mg/dL 8.110.65 9.140.67 7.820.75  Sodium 135 - 145 mmol/L 137 136 134(L)  Potassium 3.5 - 5.1 mmol/L 3.9 4.1 3.8  Chloride 98 - 111 mmol/L 103 99 98  CO2 22 - 32 mmol/L 25 28 30   Calcium 8.9 - 10.3 mg/dL 9.5(A8.4(L) 8.1(L) 8.0(L)   Total Protein 6.5 - 8.1 g/dL - 5.8(L) -  Total Bilirubin 0.3 - 1.2 mg/dL - 1.8(H) -  Alkaline Phos 38 - 126 U/L - 64 -  AST 15 - 41 U/L - 30 -  ALT 0 - 44 U/L - 105(H) -    Pertinent Imaging today Plain films and CT images have been personally visualized and interpreted; radiology reports have been reviewed. Decision making incorporated into the Impression / Recommendations.  I have spent more than 35 minutes for this patient encounter including review of prior medical records, coordination of care  with greater than 50% of time being face to face/counseling and discussing diagnostics/treatment plan with the patient/family.  Electronically signed by:   Odette FractionSabina Adham Johnson, MD Infectious Disease Physician [facility] Pager: [phone]

## 2020-11-03 NOTE — Progress Notes (Signed)
PROGRESS NOTE    Kyle Peck  NUU:[id number] DOB: [birth year] DOA: 10/20/2020 PCP: Pcp, No    Chief Complaint  Patient presents with  . Joint Swelling    Brief Narrative:   60 year old EtOH and cocaine user initially admitted to [facility] on 10/21/2019 for abdominal swelling and altered mental status..  Echocardiogram showed left ventricular ejection fraction of 15% with global hypokinesis.  He was treated for alcohol withdrawal with Ativan and transferred to ICU and placed on Precedex, eventually required intubation.  He subsequently became hypotensive requiring vasopressors.  He also developed AKI and hyperkalemia and he was on [facility] service.  He was extubated on 10/31/2020 and weaned off Precedex on 11/01/2020 and transferred to [facility] on 11/02/2020. Patient was scheduled for cardiac catheterization on 11/02/2020 but had to be aborted as patient became confused. Code stroke was called and his subsequent CT of the head and MRI were negative for acute stroke. EEG was done negative for any epileptiform activity.  It was thought to be secondary to ICU delirium.  Assessment & Plan:   Principal Problem:   Elevated brain natriuretic peptide (BNP) level Active Problems:   CHF (congestive heart failure) (HCC)   Transaminitis   Thrombocytosis   Hypoalbuminemia due to protein-calorie malnutrition (HCC)   Tobacco use   Pressure injury of skin   Acute respiratory failure (HCC)   Cardiogenic shock (HCC)   Hyperkalemia   Acute renal failure (ARF) (HCC)   Acute encephalopathy   Polysubstance abuse ([place])   ETOH abuse   MRSA bacteremia   Cocaine abuse ([place])   Septic shock secondary to MRSA bacteremia/MRSA pneumonia TTE and TEE are negative. Cardiogenic shock in the setting of EtOH abuse/cardiomyopathy Patient was weaned off vasopressors on 10/31/2020 and extubated on 10/31/2020.  Duration of IV vancomycin for MRSA bacteremia as per ID. Septic physiology improving    Acute respiratory failure  with hypoxia in the setting of acute systolic heart failure and severe EtOH withdrawal. Extubated on 10/31/2020.   Acute metabolic encephalopathy in the setting of polysubstance abuse and EtOH abuse UDS is positive for cocaine benzodiazepines and THC on admission Patient has been on Precedex and intubated, extubated on 5/29. Continue with Seroquel and Librium at current dosing and Restoril at bedtime, patient would like to hold on Restoril at bedtime as it is making him confused and sleepy in the morning.  We will hold the Restoril for now Continue with IV Haldol as needed for agitation.  Avoid benzodiazepines as it is making him confused Vitamin B12 are adequate and RPR ordered by neurology pending. Patient seen and examined he is more clear oriented to place and person and able to answer simple questions clearly and following commands.  Bedside sitter    Acute systolic heart failure Last echocardiogram showed left ventricular ejection fraction of 15% with moderate LV dilation probably secondary to a combination of EtOH abuse, cocaine, CAD cannot be ruled out. Cardiology on board and appreciate recommendations.  Patient is scheduled for Tomorrow if his mental status continues to improve.     Pressure injury buttocks present on admission Pressure Injury 10/25/20 Buttocks Bilateral;Mid Deep Tissue Pressure Injury - Purple or maroon localized area of discolored intact skin or blood-filled blister due to damage of underlying soft tissue from pressure and/or shear. (Active)  10/25/20 1715  Location: Buttocks  Location Orientation: Bilateral;Mid  Staging: Deep Tissue Pressure Injury - Purple or maroon localized area of discolored intact skin or blood-filled blister due to damage of underlying soft  tissue from pressure and/or shear.  Wound Description (Comments):   Present on Admission: Yes     Pressure Injury 10/25/20 Buttocks Left Stage 2 -  Partial thickness loss of dermis presenting as a  shallow open injury with a red, pink wound bed without slough. (Active)  10/25/20 1715  Location: Buttocks  Location Orientation: Left  Staging: Stage 2 -  Partial thickness loss of dermis presenting as a shallow open injury with a red, pink wound bed without slough.  Wound Description (Comments):   Present on Admission: Yes   Wound care consulted and appreciate recommendations.    AKI Probably secondary to septic shock, which has resolved.  Renal parameters appear to be optimal    Delirium this morning initially thought to be code stroke CT of the head and MRI of the brain and negative for acute stroke. Patient continues to be confused probably ICU delirium. Patient weaned off Precedex prior to transfer to the floor Patient currently alert and oriented to person and place and clearer than yesterday     Polysubstance abuse/EtOH abuse TOC consulted for recommendations.  And resources    Transaminitis AST is within normal limits ALT is 105 total bilirubin elevated at 1.8, alk phos is 64.  Continue to follow.    DVT prophylaxis: (Lovenox) Code Status: (Full code.  Family Communication: family at bedside.  Disposition:   Status is: Inpatient  Remains inpatient appropriate because:IV treatments appropriate due to intensity of illness or inability to take PO   Dispo: The patient is from: Home              Anticipated d/c is to: Home              Patient currently is not medically stable to d/c.   Difficult to place patient No       Consultants:   Cardiology.    Procedures: none.   Antimicrobials:  Antibiotics Given (last 72 hours)    Date/Time Action Medication Dose Rate   10/31/20 1818 New Bag/Given   vancomycin (VANCOREADY) IVPB 1250 mg/250 mL 1,250 mg 166.7 mL/hr   11/01/20 0307 New Bag/Given   vancomycin (VANCOREADY) IVPB 1250 mg/250 mL 1,250 mg 166.7 mL/hr   11/01/20 1208 New Bag/Given   vancomycin (VANCOREADY) IVPB 1250 mg/250 mL 1,250 mg 166.7  mL/hr   11/01/20 2110 New Bag/Given   vancomycin (VANCOREADY) IVPB 1250 mg/250 mL 1,250 mg 166.7 mL/hr   11/02/20 0340 New Bag/Given   vancomycin (VANCOREADY) IVPB 1250 mg/250 mL 1,250 mg 166.7 mL/hr   11/02/20 1257 New Bag/Given   vancomycin (VANCOREADY) IVPB 1250 mg/250 mL 1,250 mg 166.7 mL/hr   11/02/20 2105 New Bag/Given   vancomycin (VANCOREADY) IVPB 1250 mg/250 mL 1,250 mg 166.7 mL/hr   11/03/20 0304 New Bag/Given   vancomycin (VANCOREADY) IVPB 1250 mg/250 mL 1,250 mg 166.7 mL/hr   11/03/20 1041 New Bag/Given   vancomycin (VANCOREADY) IVPB 1250 mg/250 mL 1,250 mg 166.7 mL/hr       Subjective: Sleepy but opens eyes on verbal commands and able to answer simple questions  Objective: Vitals:   11/03/20 0906 11/03/20 1201 11/03/20 1420 11/03/20 1620  BP: 113/63 111/78 (!) 111/98 100/78  Pulse: 99 (!) 104    Resp: (!) 24 19  (!) 21  Temp: 99 F (37.2 C) 97.6 F (36.4 C)  97.7 F (36.5 C)  TempSrc: Oral Oral  Oral  SpO2: 99% 97%    Weight:      Height:  Intake/Output Summary (Last 24 hours) at 11/03/2020 1704 Last data filed at 11/03/2020 1540 Gross per 24 hour  Intake 1836.36 ml  Output 2350 ml  Net -513.64 ml   Filed Weights   10/31/20 0615 11/02/20 0321 11/03/20 0358  Weight: 85.3 kg 83.9 kg 78.2 kg    Examination:  General exam: Sleepy but not in any kind of distress Respiratory system: Air entry fair bilateral no wheezing or rhonchi Cardiovascular system: S1-S2 heard, regular rate rhythm, no JVD or pedal edema. Gastrointestinal system: Abdomen is soft nontender nondistended, bowel sounds heard Central nervous system: lethargy and oriented Extremities:  No pedal edema.  Skin: stge 2 sacral decubitus ulcer.  Psychiatry:  Mood is appropriate.     Data Reviewed: I have personally reviewed following labs and imaging studies  CBC: Recent Labs  Lab 10/28/20 0445 11/02/20 0623 11/03/20 0357  WBC 8.4 7.6 7.4  HGB 10.8* 12.1* 11.5*  HCT 36.6* 38.1*  37.2*  MCV 92.0 86.0 88.4  PLT 214 298 409    Basic Metabolic Panel: Recent Labs  Lab 10/30/20 0310 10/31/20 0326 11/01/20 0335 11/01/20 1849 11/02/20 0623 11/03/20 0357  NA 139 136 138 134* 136 137  K 3.5 3.9 3.3* 3.8 4.1 3.9  CL 97* 99 98 98 99 103  CO2 33* 32 34* '30 28 25  ' GLUCOSE 133* 137* 77 120* 88 68*  BUN 27* 26* '19 16 13 11  ' CREATININE 0.73 0.56* 0.68 0.75 0.67 0.65  CALCIUM 8.1* 8.1* 8.3* 8.0* 8.1* 8.4*  MG 1.8 1.9 1.7 2.3 2.0 2.1  PHOS 3.8 4.8* 3.9  --  3.7 4.4    GFR: Estimated Creatinine Clearance: 91.8 mL/min (by C-G formula based on SCr of 0.65 mg/dL).  Liver Function Tests: Recent Labs  Lab 10/30/20 0310 10/31/20 0326 11/01/20 0335 11/02/20 0623 11/03/20 0357  AST  --   --   --  30  --   ALT  --   --   --  105*  --   ALKPHOS  --   --   --  64  --   BILITOT  --   --   --  1.8*  --   PROT  --   --   --  5.8*  --   ALBUMIN 2.5* 2.4* 2.6* 2.7* 2.7*    CBG: Recent Labs  Lab 10/31/20 0718 10/31/20 1135 11/01/20 0727 11/01/20 1126 11/02/20 0747  GLUCAP 121* 117* 85 115* 89     Recent Results (from the past 240 hour(s))  MRSA PCR Screening     Status: Abnormal   Collection Time: 10/25/20  6:30 AM   Specimen: Nasal Mucosa; Nasopharyngeal  Result Value Ref Range Status   MRSA by PCR POSITIVE (A) NEGATIVE Final    Comment:        The GeneXpert MRSA Assay (FDA approved for NASAL specimens only), is one component of a comprehensive MRSA colonization surveillance program. It is not intended to diagnose MRSA infection nor to guide or monitor treatment for MRSA infections. RESULT CALLED TO, READ BACK BY AND VERIFIED WITH: FOLEY,B. AT 0845 BY HUFFINES,S ON 10/25/20. Performed at [facility], [address] [place], [place] [address]   Culture, blood (routine x 2)     Status: Abnormal   Collection Time: 10/25/20  8:06 AM   Specimen: BLOOD  Result Value Ref Range Status   Specimen Description   Final    BLOOD RIGHT ANTECUBITAL Performed  at [facility], [address] [place],  [place] [address]    Special Requests   Final    BOTTLES DRAWN AEROBIC AND ANAEROBIC Blood Culture adequate volume Performed at [facility], [address] [place], [place] [address]    Culture  Setup Time   Final    AEROBIC BOTTLE ONLY GRAM POSITIVE COCCI Gram Stain Report Called to,Read Back By and Verified With: W COUNCIL,RN'@0200'  10/26/20 MKELLY CRITICAL VALUE NOTED.  VALUE IS CONSISTENT WITH PREVIOUSLY REPORTED AND CALLED VALUE.    Culture (A)  Final    STAPHYLOCOCCUS AUREUS SUSCEPTIBILITIES PERFORMED ON PREVIOUS CULTURE WITHIN THE LAST 5 DAYS. Performed at [facility], [place] [address], [place], [place] [address]    Report Status 10/28/2020 FINAL  Final  Culture, blood (routine x 2)     Status: Abnormal   Collection Time: 10/25/20  8:06 AM   Specimen: BLOOD RIGHT WRIST  Result Value Ref Range Status   Specimen Description   Final    BLOOD RIGHT WRIST Performed at [place] [address] [place], [place] [address]    Special Requests   Final    BOTTLES DRAWN AEROBIC AND ANAEROBIC Blood Culture adequate volume Performed at [facility], [place] [address] [place] [address], [place], [place] [address]    Culture  Setup Time   Final    AEROBIC BOTTLE ONLY GRAM POSITIVE COCCI Gram Stain Report Called to,Read Back By and Verified With: W COUNCIL,RN'@2204'  10/25/20 MKELLY CRITICAL RESULT CALLED TO, READ BACK BY AND VERIFIED WITH: PHARMD GREG ABBOTT 10/26/2020 AT 0440 A.HUGHES ANAEROBIC BOTTLE ALSO Performed at [facility], [address] [place], [place] [address]    Culture METHICILLIN RESISTANT STAPHYLOCOCCUS AUREUS (A)  Final   Report Status 10/28/2020 FINAL  Final   Organism ID, Bacteria METHICILLIN RESISTANT STAPHYLOCOCCUS AUREUS  Final      Susceptibility   Methicillin resistant staphylococcus aureus - MIC*    CIPROFLOXACIN <=0.5 SENSITIVE Sensitive     ERYTHROMYCIN >=8 RESISTANT Resistant     GENTAMICIN <=0.5 SENSITIVE  Sensitive     OXACILLIN >=4 RESISTANT Resistant     TETRACYCLINE >=16 RESISTANT Resistant     VANCOMYCIN <=0.5 SENSITIVE Sensitive     TRIMETH/SULFA <=10 SENSITIVE Sensitive     CLINDAMYCIN <=0.25 SENSITIVE Sensitive     RIFAMPIN <=0.5 SENSITIVE Sensitive     Inducible Clindamycin NEGATIVE Sensitive     * METHICILLIN RESISTANT STAPHYLOCOCCUS AUREUS  Blood Culture ID Panel (Reflexed)     Status: Abnormal   Collection Time: 10/25/20  8:06 AM  Result Value Ref Range Status   Enterococcus faecalis NOT DETECTED NOT DETECTED Final   Enterococcus Faecium NOT DETECTED NOT DETECTED Final   Listeria monocytogenes NOT DETECTED NOT DETECTED Final   Staphylococcus species DETECTED (A) NOT DETECTED Final    Comment: RBV PHARMD GREG ABBOTT 10/26/2020 AT 0441 A.HUGHES    Staphylococcus aureus (BCID) DETECTED (A) NOT DETECTED Final    Comment: Methicillin (oxacillin)-resistant Staphylococcus aureus (MRSA). MRSA is predictably resistant to beta-lactam antibiotics (except ceftaroline). Preferred therapy is vancomycin unless clinically contraindicated. Patient requires contact precautions if  hospitalized. RBV PHARMD GREG ABBOTT 10/26/2020 AT 0440 A.HUGHES    Staphylococcus epidermidis NOT DETECTED NOT DETECTED Final   Staphylococcus lugdunensis NOT DETECTED NOT DETECTED Final   Streptococcus species NOT DETECTED NOT DETECTED Final   Streptococcus agalactiae NOT DETECTED NOT DETECTED Final   Streptococcus pneumoniae NOT DETECTED NOT DETECTED Final   Streptococcus pyogenes NOT DETECTED NOT DETECTED Final   A.calcoaceticus-baumannii NOT DETECTED NOT DETECTED Final  Bacteroides fragilis NOT DETECTED NOT DETECTED Final   Enterobacterales NOT DETECTED NOT DETECTED Final   Enterobacter cloacae complex NOT DETECTED NOT DETECTED Final   Escherichia coli NOT DETECTED NOT DETECTED Final   Klebsiella aerogenes NOT DETECTED NOT DETECTED Final   Klebsiella oxytoca NOT DETECTED NOT DETECTED Final   Klebsiella  pneumoniae NOT DETECTED NOT DETECTED Final   Proteus species NOT DETECTED NOT DETECTED Final   Salmonella species NOT DETECTED NOT DETECTED Final   Serratia marcescens NOT DETECTED NOT DETECTED Final   Haemophilus influenzae NOT DETECTED NOT DETECTED Final   Neisseria meningitidis NOT DETECTED NOT DETECTED Final   Pseudomonas aeruginosa NOT DETECTED NOT DETECTED Final   Stenotrophomonas maltophilia NOT DETECTED NOT DETECTED Final   Candida albicans NOT DETECTED NOT DETECTED Final   Candida auris NOT DETECTED NOT DETECTED Final   Candida glabrata NOT DETECTED NOT DETECTED Final   Candida krusei NOT DETECTED NOT DETECTED Final   Candida parapsilosis NOT DETECTED NOT DETECTED Final   Candida tropicalis NOT DETECTED NOT DETECTED Final   Cryptococcus neoformans/gattii NOT DETECTED NOT DETECTED Final   Meth resistant mecA/C and MREJ DETECTED (A) NOT DETECTED Final    Comment: PHARMD GREG ABBOTT 10/26/2020 AT 0441 A.HUGHES CRITICAL RESULT CALLED TO, READ BACK BY AND VERIFIED WITH: Performed at [facility], [place] [address] [place], [place] [address]   Culture, blood (routine x 2)     Status: None   Collection Time: 10/27/20  6:00 AM   Specimen: BLOOD  Result Value Ref Range Status   Specimen Description BLOOD LEFT HAND  Final   Special Requests   Final    BOTTLES DRAWN AEROBIC AND ANAEROBIC Blood Culture adequate volume   Culture   Final    NO GROWTH 5 DAYS Performed at [facility], [place] [address] [place], [place] [address]    Report Status 11/01/2020 FINAL  Final  Culture, blood (routine x 2)     Status: None   Collection Time: 10/27/20  6:05 AM   Specimen: BLOOD  Result Value Ref Range Status   Specimen Description BLOOD RIGHT FOREARM  Final   Special Requests   Final    BOTTLES DRAWN AEROBIC AND ANAEROBIC Blood Culture adequate volume   Culture   Final    NO GROWTH 5 DAYS Performed at [facility], [place] [address], [place], [place] [address]    Report Status  11/01/2020 FINAL  Final         Radiology Studies: MR BRAIN WO CONTRAST  Result Date: 11/02/2020 CLINICAL DATA:  Acute neurological deficit. Altered mental status. Negative acute CT evaluation. EXAM: MRI HEAD WITHOUT CONTRAST TECHNIQUE: Multiplanar, multiecho pulse sequences of the brain and surrounding structures were obtained without intravenous contrast. COMPARISON:  CT studies earlier same day FINDINGS: Brain: Diffusion imaging does not show any acute or subacute infarction. No focal abnormality affects the brainstem or cerebellum. Cerebral hemispheres show a few scattered foci of T2 and FLAIR signal within the white matter consistent with minimal small vessel change. No cortical or large vessel territory abnormality. No mass lesion, hemorrhage, hydrocephalus or extra-axial collection. Vascular: Major vessels at the base of the brain show flow. Skull and upper cervical spine: Negative Sinuses/Orbits: Clear/normal Other: None IMPRESSION: No acute finding. Normal study except for a few punctate foci of T2 and FLAIR signal within the cerebral hemispheric white matter consistent with minimal small vessel change. Electronically Signed   By: Nelson Chimes M.D.   On:  11/02/2020 08:46   EEG adult  Result Date: 11/02/2020 Lora Havens, MD     11/02/2020  6:08 PM Patient Name: Kyle Peck MRN: [id number] Epilepsy Attending: Lora Havens Referring Physician/Provider: Dr Lesleigh Noe Date: 11/02/2020 Duration: 22.31 mins Patient history: 60 year old male who had acute onset of perseveration followed by decreased verbalization with grunting this morning prior to cardiac catheterization. EEG to evaluate for seizure Level of alertness: Awake, drowsy AEDs during EEG study: Temazepam Technical aspects: This EEG study was done with scalp electrodes positioned according to the 10-20 International system of electrode placement. Electrical activity was acquired at a sampling rate of '500Hz'  and reviewed with a  high frequency filter of '70Hz'  and a low frequency filter of '1Hz' . EEG data were recorded continuously and digitally stored. Description: The posterior dominant rhythm consists of 9 Hz activity of moderate voltage (25-35 uV) seen predominantly in posterior head regions, symmetric and reactive to eye opening and eye closing. Drowsiness was characterized by attenuation of the posterior background rhythm. Hyperventilation and photic stimulation were not performed.   IMPRESSION: This study is within normal limits. No seizures or epileptiform discharges were seen throughout the recording. Olivia Lopez de Gutierrez   DG HIP UNILAT WITH PELVIS 2-3 VIEWS LEFT  Result Date: 11/03/2020 CLINICAL DATA:  Recent fall EXAM: DG HIP (WITH OR WITHOUT PELVIS) 2-3V LEFT COMPARISON:  None. FINDINGS: No acute fracture or dislocation. No aggressive osseous lesion. Normal alignment. Generalized osteopenia. Soft tissue are unremarkable. No radiopaque foreign body or soft tissue emphysema. IMPRESSION: No acute osseous injury of the left hip. Given the patient's age and osteopenia, if there is persistent clinical concern for an occult hip fracture, a MRI of the hip is recommended for increased sensitivity. Electronically Signed   By: Kathreen Devoid   On: 11/03/2020 10:01   DG HIP UNILAT WITH PELVIS 2-3 VIEWS RIGHT  Result Date: 11/03/2020 CLINICAL DATA:  Recent fall. EXAM: DG HIP (WITH OR WITHOUT PELVIS) 2-3V RIGHT COMPARISON:  Abdomen 10/26/2020. FINDINGS: Degenerative changes lumbar spine and both hips. No acute bony or joint abnormality identified. No evidence of fracture or dislocation. IMPRESSION: Degenerative changes lumbar spine and both hips. Electronically Signed   By: Marcello Moores  Register   On: 11/03/2020 10:04   CT HEAD CODE STROKE WO CONTRAST`  Result Date: 11/02/2020 CLINICAL DATA:  Code stroke. 28-year-old male with altered mental status. EXAM: CT HEAD WITHOUT CONTRAST TECHNIQUE: Contiguous axial images were obtained from the base of  the skull through the vertex without intravenous contrast. COMPARISON:  None. FINDINGS: Brain: Mild motion artifact. Cerebral volume is within normal limits for age. No midline shift, ventriculomegaly, mass effect, evidence of mass lesion, intracranial hemorrhage or evidence of cortically based acute infarction. No cortical encephalomalacia identified. There is patchy white matter hypodensity along the inferior frontal horns greater on the right. Vascular: No suspicious intracranial vascular hyperdensity. Skull: No acute osseous abnormality identified. There is a 3 cm nasal septal defect. Sinuses/Orbits: Small right maxillary sinus retention cyst. Otherwise well aerated. Other: [place] soft tissue thickening at the right scalp vertex. Visualized orbit soft tissues are within normal limits. ASPECTS [facility] Stroke Program Early CT Score) Total score (0-10 with 10 being normal): 10 IMPRESSION: 1. Mild for age white matter changes. No acute cortically based infarct or acute intracranial hemorrhage identified. ASPECTS 10. 2. [place] soft tissue thickening of the right scalp. Eroded nasal septum. 3. These results were communicated to Dr. Curly Shores at 8:10 am on 11/02/2020 by text page via the [facility] messaging  system. Electronically Signed   By: Genevie Ann M.D.   On: 11/02/2020 08:11   CT ANGIO HEAD NECK W WO CM W PERF (CODE STROKE)  Addendum Date: 11/02/2020   ADDENDUM REPORT: 11/02/2020 08:45 ADDENDUM: Negative CT perfusion. Electronically Signed   By: Logan Bores M.D.   On: 11/02/2020 08:45   Result Date: 11/02/2020 CLINICAL DATA:  Altered mental status. EXAM: CT ANGIOGRAPHY HEAD AND NECK CT PERFUSION BRAIN TECHNIQUE: Multidetector CT imaging of the head and neck was performed using the standard protocol during bolus administration of intravenous contrast. Multiplanar CT image reconstructions and MIPs were obtained to evaluate the vascular anatomy. Carotid stenosis measurements (when applicable) are obtained utilizing  NASCET criteria, using the distal internal carotid diameter as the denominator. Multiphase CT imaging of the brain was performed following IV bolus contrast injection. Subsequent parametric perfusion maps were calculated using RAPID software. CONTRAST:  157m OMNIPAQUE IOHEXOL 350 MG/ML SOLN COMPARISON:  None. FINDINGS: CTA NECK FINDINGS Aortic arch: Standard 3 vessel aortic arch with mild atherosclerotic plaque. Widely patent arch vessel origins. Right carotid system: Patent with minimal plaque in the carotid bulb. No evidence of dissection or stenosis. Left carotid system: Patent with a small amount of calcified and soft plaque in the carotid bulb. No evidence of dissection or stenosis. Vertebral arteries: The left vertebral artery is strongly dominant and widely patent without evidence of a significant stenosis or dissection. The right vertebral artery is hypoplastic. The proximal right V1 segment is poorly visualized due to small size and shoulder artifact, and an underlying stenosis is not excluded. Skeleton: Cervical disc degeneration greatest at C6-7 where there is prominent degenerative endplate sclerosis. Advanced facet arthrosis on the left at C3-4 and on the right at C4-5 with minimal anterolisthesis at both levels. Other neck: No evidence of cervical lymphadenopathy or mass. Mild nonspecific stranding/soft tissue swelling in the neck bilaterally. Left internal jugular venous catheter terminating in the left brachiocephalic vein (similar positioning to 10/30/2020 chest radiographs). Upper chest: No apical lung consolidation or mass. Review of the MIP images confirms the above findings CTA HEAD FINDINGS Anterior circulation: The internal carotid arteries are widely patent from skull base to carotid termini. ACAs and MCAs are patent without evidence of a proximal branch occlusion or significant proximal stenosis. No aneurysm is identified. Posterior circulation: The intracranial vertebral arteries are  patent with the left being strongly dominant and the right being particularly diminutive distal to the PICA origin. Patent PICA and SCA origins are seen bilaterally. The basilar artery is widely patent. There are left larger than right posterior communicating arteries with hypoplasia of the left P1 segment. Both PCAs are patent without evidence of a significant proximal stenosis. No aneurysm is identified. Venous sinuses: As permitted by contrast timing, patent. Anatomic variants: Fetal left PCA. Review of the MIP images confirms the above findings CT Brain Perfusion Findings: ASPECTS: 10 CBF (<30%) Volume: 0 mL Perfusion (Tmax>6.0s) volume: 0 mL IMPRESSION: 1. No emergent large vessel occlusion. 2. No significant proximal intracranial stenosis. 3. Mild cervical carotid atherosclerosis without significant stenosis. 4. Patent vertebral arteries with the left being strongly dominant. 5.  Aortic Atherosclerosis (ICD10-I70.0). These results were communicated to Dr. BCurly Shoresat 8:26 am on 11/02/2020 by text page via the [facility]aging system. Electronically Signed: By: ALogan BoresM.D. On: 11/02/2020 08:39        Scheduled Meds: . aspirin  81 mg Oral Pre-Cath  . chlordiazePOXIDE  10 mg Oral TID  . Chlorhexidine Gluconate Cloth  6 each Topical Daily  . digoxin  0.125 mg Oral Daily  . docusate sodium  100 mg Oral BID  . enoxaparin (LOVENOX) injection  40 mg Subcutaneous Q24H  . feeding supplement  237 mL Oral TID BM  . folic acid  1 mg Oral Daily  . furosemide  40 mg Oral Daily  . losartan  25 mg Oral Daily  . multivitamin with minerals  1 tablet Oral Daily  . pantoprazole (PROTONIX) IV  40 mg Intravenous Q24H  . polyethylene glycol  17 g Oral BID  . QUEtiapine  100 mg Oral BID  . sodium chloride flush  10-40 mL Intracatheter Q12H  . sodium chloride flush  3 mL Intravenous Q12H  . sodium chloride flush  3 mL Intravenous Q12H  . spironolactone  12.5 mg Oral Daily  . temazepam  30 mg Oral QHS  .  thiamine  100 mg Oral Daily   Or  . thiamine  100 mg Intravenous Daily   Continuous Infusions: . sodium chloride Stopped (11/01/20 0821)  . sodium chloride    . sodium chloride 10 mL/hr at 11/02/20 0622  . [START ON 11/11/2020] oritavancin (ORBACTIV) IVPB    . vancomycin       LOS: 13 days        Hosie Poisson, MD [facility]   To contact the attending provider between 7A-7P or the covering provider during after hours 7P-7A, please log into the web site [email or web address] and access using universal [place] password for that web site. If you do not have the password, please call the hospital operator.  11/03/2020, 5:04 PM

## 2020-11-03 NOTE — Progress Notes (Signed)
OT Cancellation Note  Patient Details Name: [name] MRN: [id number] DOB: September 07, 1960   Cancelled Treatment:    Reason Eval/Treat Not Completed: Patient's level of consciousness. Pt would not open his eyes or verbally respond to participate in therapy. OT provided a sternal massage, all lights on, curtains open, and blankets removed, pt still would not wake up. OT will follow up as time allows.  Lynee Rosenbach H., OTR/L Acute Rehabilitation  Tanasha Menees Elane Bing Plume 11/03/2020, 4:29 PM

## 2020-11-04 ENCOUNTER — Other Ambulatory Visit (HOSPITAL_COMMUNITY): Payer: Self-pay

## 2020-11-04 ENCOUNTER — Inpatient Hospital Stay (HOSPITAL_COMMUNITY)
Admission: EM | Discharge status: Against Medical Advice | Payer: Self-pay | Source: Home / Self Care | Attending: Pulmonary Disease

## 2020-11-04 ENCOUNTER — Inpatient Hospital Stay (HOSPITAL_COMMUNITY): Payer: Self-pay

## 2020-11-04 ENCOUNTER — Inpatient Hospital Stay: Payer: Self-pay

## 2020-11-04 DIAGNOSIS — I2510 Atherosclerotic heart disease of native coronary artery without angina pectoris: Secondary | ICD-10-CM

## 2020-11-04 HISTORY — PX: RIGHT/LEFT HEART CATH AND CORONARY ANGIOGRAPHY: CATH118266

## 2020-11-04 LAB — CBC
HCT: 39 % (ref 39.0–52.0)
Hemoglobin: 12.2 g/dL — ABNORMAL LOW (ref 13.0–17.0)
MCH: 26.9 pg (ref 26.0–34.0)
MCHC: 31.3 g/dL (ref 30.0–36.0)
MCV: 85.9 fL (ref 80.0–100.0)
Platelets: 446 10*3/uL — ABNORMAL HIGH (ref 150–400)
RBC: 4.54 MIL/uL (ref 4.22–5.81)
RDW: 16.4 % — ABNORMAL HIGH (ref 11.5–15.5)
WBC: 7.9 10*3/uL (ref 4.0–10.5)
nRBC: 0 % (ref 0.0–0.2)

## 2020-11-04 LAB — POCT I-STAT EG7
Acid-Base Excess: 1 mmol/L (ref 0.0–2.0)
Acid-Base Excess: 2 mmol/L (ref 0.0–2.0)
Bicarbonate: 25.3 mmol/L (ref 20.0–28.0)
Bicarbonate: 26.6 mmol/L (ref 20.0–28.0)
Calcium, Ion: 1.12 mmol/L — ABNORMAL LOW (ref 1.15–1.40)
Calcium, Ion: 1.19 mmol/L (ref 1.15–1.40)
HCT: 38 % — ABNORMAL LOW (ref 39.0–52.0)
HCT: 39 % (ref 39.0–52.0)
Hemoglobin: 12.9 g/dL — ABNORMAL LOW (ref 13.0–17.0)
Hemoglobin: 13.3 g/dL (ref 13.0–17.0)
O2 Saturation: 69 %
O2 Saturation: 70 %
Potassium: 3.5 mmol/L (ref 3.5–5.1)
Potassium: 3.6 mmol/L (ref 3.5–5.1)
Sodium: 139 mmol/L (ref 135–145)
Sodium: 140 mmol/L (ref 135–145)
TCO2: 26 mmol/L (ref 22–32)
TCO2: 28 mmol/L (ref 22–32)
pCO2, Ven: 37.6 mmHg — ABNORMAL LOW (ref 44.0–60.0)
pCO2, Ven: 39.1 mmHg — ABNORMAL LOW (ref 44.0–60.0)
pH, Ven: 7.436 — ABNORMAL HIGH (ref 7.250–7.430)
pH, Ven: 7.44 — ABNORMAL HIGH (ref 7.250–7.430)
pO2, Ven: 35 mmHg (ref 32.0–45.0)
pO2, Ven: 35 mmHg (ref 32.0–45.0)

## 2020-11-04 LAB — RENAL FUNCTION PANEL
Albumin: 1.8 g/dL — ABNORMAL LOW (ref 3.5–5.0)
Anion gap: 5 (ref 5–15)
BUN: 14 mg/dL (ref 6–20)
CO2: 20 mmol/L — ABNORMAL LOW (ref 22–32)
Calcium: 6.3 mg/dL — CL (ref 8.9–10.3)
Chloride: 116 mmol/L — ABNORMAL HIGH (ref 98–111)
Creatinine, Ser: 0.48 mg/dL — ABNORMAL LOW (ref 0.61–1.24)
GFR, Estimated: >60 mL/min (ref >60)
Glucose, Bld: 73 mg/dL (ref 70–99)
Phosphorus: 3.1 mg/dL (ref 2.5–4.6)
Potassium: 2.6 mmol/L — CL (ref 3.5–5.1)
Sodium: 141 mmol/L (ref 135–145)

## 2020-11-04 LAB — BASIC METABOLIC PANEL
Anion gap: 9 (ref 5–15)
BUN: 14 mg/dL (ref 6–20)
CO2: 23 mmol/L (ref 22–32)
Calcium: 8.6 mg/dL — ABNORMAL LOW (ref 8.9–10.3)
Chloride: 103 mmol/L (ref 98–111)
Creatinine, Ser: 0.73 mg/dL (ref 0.61–1.24)
GFR, Estimated: >60 mL/min (ref >60)
Glucose, Bld: 116 mg/dL — ABNORMAL HIGH (ref 70–99)
Potassium: 4 mmol/L (ref 3.5–5.1)
Sodium: 135 mmol/L (ref 135–145)

## 2020-11-04 LAB — MAGNESIUM
Magnesium: 1.2 mg/dL — ABNORMAL LOW (ref 1.7–2.4)
Magnesium: 1.7 mg/dL (ref 1.7–2.4)

## 2020-11-04 LAB — COOXEMETRY PANEL
Carboxyhemoglobin: 1.5 % (ref 0.5–1.5)
Methemoglobin: 0.8 % (ref 0.0–1.5)
O2 Saturation: 75.2 %
Total hemoglobin: 6.5 g/dL — CL (ref 12.0–16.0)

## 2020-11-04 SURGERY — RIGHT/LEFT HEART CATH AND CORONARY ANGIOGRAPHY
Anesthesia: LOCAL

## 2020-11-04 MED ORDER — MIDAZOLAM HCL 2 MG/2ML IJ SOLN
INTRAMUSCULAR | Status: DC | PRN
  Administered 2020-11-04: 0.5 mg via INTRAVENOUS

## 2020-11-04 MED ORDER — ASPIRIN 81 MG PO CHEW: 81.0000 mg | CHEWABLE_TABLET | Freq: Every day | ORAL | Status: DC

## 2020-11-04 MED ORDER — MAGNESIUM SULFATE 4 GM/100ML IV SOLN
4.0000 g | Freq: Once | INTRAVENOUS | Status: DC
  Filled 2020-11-04: qty 100

## 2020-11-04 MED ORDER — ATORVASTATIN CALCIUM 40 MG PO TABS
40.0000 mg | ORAL_TABLET | Freq: Every day | ORAL | Status: DC
  Administered 2020-11-04: 40 mg via ORAL
  Filled 2020-11-04: qty 1

## 2020-11-04 MED ORDER — SODIUM CHLORIDE 0.9 % IV SOLN: INTRAVENOUS | Status: AC

## 2020-11-04 MED ORDER — VERAPAMIL HCL 2.5 MG/ML IV SOLN
INTRAVENOUS | Status: AC
  Filled 2020-11-04: qty 2

## 2020-11-04 MED ORDER — LABETALOL HCL 5 MG/ML IV SOLN: 10.0000 mg | INTRAVENOUS | Status: AC | PRN

## 2020-11-04 MED ORDER — SODIUM CHLORIDE 0.9 % IV SOLN: INTRAVENOUS | Status: DC

## 2020-11-04 MED ORDER — HEPARIN SODIUM (PORCINE) 1000 UNIT/ML IJ SOLN
INTRAMUSCULAR | Status: DC | PRN
  Administered 2020-11-04: 4000 [IU] via INTRAVENOUS

## 2020-11-04 MED ORDER — SPIRONOLACTONE 25 MG PO TABS: 12.5000 mg | ORAL_TABLET | Freq: Every day | ORAL | 5 refills | Status: DC

## 2020-11-04 MED ORDER — SODIUM CHLORIDE 0.9% FLUSH: 3.0000 mL | Freq: Two times a day (BID) | INTRAVENOUS | Status: DC

## 2020-11-04 MED ORDER — SODIUM CHLORIDE 0.9 % IV SOLN: 250.0000 mL | INTRAVENOUS | Status: DC | PRN

## 2020-11-04 MED ORDER — ASPIRIN 81 MG PO CHEW: 81.0000 mg | CHEWABLE_TABLET | Freq: Every day | ORAL | 5 refills | Status: DC

## 2020-11-04 MED ORDER — IOHEXOL 350 MG/ML SOLN
INTRAVENOUS | Status: DC | PRN
  Administered 2020-11-04: 70 mL via INTRA_ARTERIAL

## 2020-11-04 MED ORDER — FENTANYL CITRATE (PF) 100 MCG/2ML IJ SOLN
INTRAMUSCULAR | Status: DC | PRN
  Administered 2020-11-04: 12.5 ug via INTRAVENOUS

## 2020-11-04 MED ORDER — LOSARTAN POTASSIUM 25 MG PO TABS: 12.5000 mg | ORAL_TABLET | Freq: Every day | ORAL | 5 refills | Status: DC

## 2020-11-04 MED ORDER — SODIUM CHLORIDE 0.9% FLUSH: 3.0000 mL | INTRAVENOUS | Status: DC | PRN

## 2020-11-04 MED ORDER — HEPARIN SODIUM (PORCINE) 1000 UNIT/ML IJ SOLN
INTRAMUSCULAR | Status: AC
  Filled 2020-11-04: qty 1

## 2020-11-04 MED ORDER — ONDANSETRON HCL 4 MG/2ML IJ SOLN
4.0000 mg | Freq: Four times a day (QID) | INTRAMUSCULAR | Status: DC | PRN
Start: 2020-11-04 — End: 2020-11-05

## 2020-11-04 MED ORDER — HEPARIN (PORCINE) IN NACL 1000-0.9 UT/500ML-% IV SOLN
INTRAVENOUS | Status: DC | PRN
  Administered 2020-11-04 (×2): 500 mL

## 2020-11-04 MED ORDER — LIDOCAINE HCL (PF) 1 % IJ SOLN
INTRAMUSCULAR | Status: AC
  Filled 2020-11-04: qty 30

## 2020-11-04 MED ORDER — POTASSIUM CHLORIDE CRYS ER 20 MEQ PO TBCR
60.0000 meq | EXTENDED_RELEASE_TABLET | Freq: Once | ORAL | Status: AC
  Administered 2020-11-04: 60 meq via ORAL

## 2020-11-04 MED ORDER — MIDAZOLAM HCL 2 MG/2ML IJ SOLN
INTRAMUSCULAR | Status: AC
  Filled 2020-11-04: qty 2

## 2020-11-04 MED ORDER — POTASSIUM CHLORIDE CRYS ER 20 MEQ PO TBCR
EXTENDED_RELEASE_TABLET | ORAL | Status: AC
  Filled 2020-11-04: qty 3

## 2020-11-04 MED ORDER — DIGOXIN 125 MCG PO TABS: 0.1250 mg | ORAL_TABLET | Freq: Every day | ORAL | 5 refills | Status: DC

## 2020-11-04 MED ORDER — ORITAVANCIN DIPHOSPHATE 400 MG IV SOLR
1200.0000 mg | Freq: Once | INTRAVENOUS | Status: AC
  Administered 2020-11-04: 1200 mg via INTRAVENOUS
  Filled 2020-11-04 (×2): qty 120

## 2020-11-04 MED ORDER — LIDOCAINE HCL (PF) 1 % IJ SOLN
INTRAMUSCULAR | Status: DC | PRN
  Administered 2020-11-04 (×2): 2 mL

## 2020-11-04 MED ORDER — ACETAMINOPHEN 325 MG PO TABS: 650.0000 mg | ORAL_TABLET | ORAL | Status: DC | PRN

## 2020-11-04 MED ORDER — ASPIRIN 81 MG PO CHEW
81.0000 mg | CHEWABLE_TABLET | ORAL | Status: AC
  Administered 2020-11-04: 81 mg via ORAL
  Filled 2020-11-04: qty 1

## 2020-11-04 MED ORDER — HYDRALAZINE HCL 20 MG/ML IJ SOLN: 10.0000 mg | INTRAMUSCULAR | Status: AC | PRN

## 2020-11-04 MED ORDER — FUROSEMIDE 20 MG PO TABS: 20.0000 mg | ORAL_TABLET | ORAL | 11 refills | Status: DC | PRN

## 2020-11-04 MED ORDER — FENTANYL CITRATE (PF) 100 MCG/2ML IJ SOLN
INTRAMUSCULAR | Status: AC
  Filled 2020-11-04: qty 2

## 2020-11-04 MED ORDER — ENOXAPARIN SODIUM 40 MG/0.4ML IJ SOSY: 40.0000 mg | PREFILLED_SYRINGE | INTRAMUSCULAR | Status: DC

## 2020-11-04 MED ORDER — LOSARTAN POTASSIUM 25 MG PO TABS
12.5000 mg | ORAL_TABLET | Freq: Every day | ORAL | Status: DC
  Administered 2020-11-04: 12.5 mg via ORAL
  Filled 2020-11-04: qty 1

## 2020-11-04 MED ORDER — ATORVASTATIN CALCIUM 40 MG PO TABS: 40.0000 mg | ORAL_TABLET | Freq: Every day | ORAL | 5 refills | Status: DC

## 2020-11-04 MED ORDER — POTASSIUM CHLORIDE CRYS ER 20 MEQ PO TBCR
40.0000 meq | EXTENDED_RELEASE_TABLET | Freq: Once | ORAL | Status: DC
  Filled 2020-11-04: qty 2

## 2020-11-04 MED ORDER — LINEZOLID 600 MG PO TABS
600.0000 mg | ORAL_TABLET | Freq: Two times a day (BID) | ORAL | 0 refills | Status: DC
  Filled 2020-11-04: qty 28, 14d supply, fill #0

## 2020-11-04 MED ORDER — HEPARIN (PORCINE) IN NACL 1000-0.9 UT/500ML-% IV SOLN
INTRAVENOUS | Status: AC
  Filled 2020-11-04: qty 1000

## 2020-11-04 MED ORDER — ASPIRIN 81 MG PO CHEW: 81.0000 mg | CHEWABLE_TABLET | ORAL | Status: DC

## 2020-11-04 MED ORDER — HALOPERIDOL 1 MG PO TABS
1.0000 mg | ORAL_TABLET | Freq: Three times a day (TID) | ORAL | Status: DC | PRN
  Filled 2020-11-04: qty 2

## 2020-11-04 SURGICAL SUPPLY — 14 items
CATH 5FR JL3.5 JR4 ANG PIG MP (CATHETERS) ×2 IMPLANT
CATH SWAN GANZ 7F STRAIGHT (CATHETERS) ×2 IMPLANT
DEVICE RAD COMP TR BAND LRG (VASCULAR PRODUCTS) ×2 IMPLANT
GLIDESHEATH SLEND SS 6F .021 (SHEATH) ×2 IMPLANT
GLIDESHEATH SLENDER 7FR .021G (SHEATH) ×4 IMPLANT
GUIDEWIRE INQWIRE 1.5J.035X260 (WIRE) ×1 IMPLANT
INQWIRE 1.5J .035X260CM (WIRE) ×2
KIT HEART LEFT (KITS) ×2 IMPLANT
PACK CARDIAC CATHETERIZATION (CUSTOM PROCEDURE TRAY) ×2 IMPLANT
SHEATH PROBE COVER 6X72 (BAG) ×2 IMPLANT
TRANSDUCER W/STOPCOCK (MISCELLANEOUS) ×2 IMPLANT
TUBING ART PRESS 72  MALE/FEM (TUBING) ×1
TUBING ART PRESS 72 MALE/FEM (TUBING) ×1 IMPLANT
WIRE HI TORQ VERSACORE-J 145CM (WIRE) ×2 IMPLANT

## 2020-11-04 NOTE — Progress Notes (Signed)
Patient disconnected himself from his IV and heart monitor. Patient signed AMA paperwork. This RN advised patient that he still had 30 minutes left on his IV antibiotic infusion, but patient declined to finish the infusion. IV removed from patient's arm per his request. Patient was provided with paper scrubs to wear. Patient states that his friend is coming to pick him up.

## 2020-11-04 NOTE — Interval H&P Note (Signed)
History and Physical Interval Note:  11/04/2020 7:54 AM  Kyle Peck  has presented today for surgery, with the diagnosis of chest pain.  The various methods of treatment have been discussed with the patient and family. After consideration of risks, benefits and other options for treatment, the patient has consented to  Procedure(s): RIGHT/LEFT HEART CATH AND CORONARY ANGIOGRAPHY (N/A) as a surgical intervention.  The patient's history has been reviewed, patient examined, no change in status, stable for surgery.  I have reviewed the patient's chart and labs.  Questions were answered to the patient's satisfaction.     [name] [employer]

## 2020-11-04 NOTE — Progress Notes (Addendum)
[facility] Infectious Diseases Follow Up Note  Patient Identification: Patient Name: Kyle Peck MRN: [id number] Admit Date: 10/20/2020 11:52 PM Age: 60 y.o.Today's Date: 11/04/2020   Reason for Visit: MRSA bacteremia, CAD  Principal Problem:   Elevated brain natriuretic peptide (BNP) level Active Problems:   CHF (congestive heart failure) (HCC)   Transaminitis   Thrombocytosis   Hypoalbuminemia due to protein-calorie malnutrition (HCC)   Tobacco use   Pressure injury of skin   Acute respiratory failure (HCC)   Cardiogenic shock (HCC)   Hyperkalemia   Acute renal failure (ARF) (HCC)   Acute encephalopathy   Polysubstance abuse (HCC)   ETOH abuse   MRSA bacteremia   Cocaine abuse (HCC)  Antibiotics:Vancomycin 5/18-current  Lines/Tubes:PIV's  Interval Events: Afebrile, no leukocytosis, delirium is improving, status post cath with two-vessel CAD.  TCD has been consulted   Assessment Complicated MRSA bacteremia-TTE and TEE negative for vegetations CAD with acute systolic CHF Polysubstance abuse Possible Liver Cirrhosis per US abdomen - Hep panel negative  Timing of CABG   Recommendations Ideally would be preferable to have him received 4 weeks of vancomycin from date of negative blood cultures on 5/25 before he goes for CABG where he possibly will need sternal wires, in order to aggressively treat his current infection if possible.  I would also get a repeat blood culture off antibiotics for 48 hours after completion of 4 weeks to make sure he is cleared.  Monitor CBC and CMP on IV antibiotics CHF and CAD management per cardiology TCTS Please call with questions  Rest of the management as per the primary team. Thank you for the consult. Please page with pertinent questions or concerns.  ______________________________________________________________________ Subjective patient seen and examined at  the bedside.  He is having breakfast.  He is able to tell me where he is, the year, the month.  Able to follow commands   Vitals BP (!) 131/100 (BP Location: Left Wrist)   Pulse (!) 104   Temp 98.3 F (36.8 C) (Oral)   Resp 16   Ht 5\' 7"  (1.702 m)   Wt 77.3 kg   SpO2 98%   BMI 26.70 kg/m     Physical Exam Constitutional:   Not in acute distress, sitting up in bed and having breakfast    Comments:   Cardiovascular:     Rate and Rhythm: Normal rate and regular rhythm.     Heart sounds:   Pulmonary:     Effort: Pulmonary effort is normal.     Comments: Clear lung sounds bilaterally  Abdominal:     Palpations: Abdomen is soft.     Tenderness: Nontender and nondistended  Musculoskeletal:        General: No swelling or tenderness.   Skin:    Comments: No lesions or rashes  Neurological:     General: Awake alert, oriented to place, person and time  Psychiatric:        Mood and Affect: Mood normal.   Pertinent Microbiology Results for orders placed or performed during the hospital encounter of 10/20/20  SARS CORONAVIRUS 2 (TAT 6-24 HRS) Nasopharyngeal Nasopharyngeal Swab     Status: None   Collection Time: 10/21/20  2:00 AM   Specimen: Nasopharyngeal Swab  Result Value Ref Range Status   SARS Coronavirus 2 NEGATIVE NEGATIVE Final    Comment: (NOTE) SARS-CoV-2 target nucleic acids are NOT DETECTED.  The SARS-CoV-2 RNA is generally detectable in upper and lower respiratory specimens during the acute phase of infection.  Negative results do not preclude SARS-CoV-2 infection, do not rule out co-infections with other pathogens, and should not be used as the sole basis for treatment or other patient management decisions. Negative results must be combined with clinical observations, patient history, and epidemiological information. The expected result is Negative.  Fact Sheet for Patients: HairSlick.no  Fact Sheet for Healthcare  Providers: quierodirigir.com  This test is not yet approved or cleared by the [place] FDA and  has been authorized for detection and/or diagnosis of SARS-CoV-2 by FDA under an Emergency Use Authorization (EUA). This EUA will remain  in effect (meaning this test can be used) for the duration of the COVID-19 declaration under Se ction 564(b)(1) of the Act, 21 U.S.C. section 360bbb-3(b)(1), unless the authorization is terminated or revoked sooner.  Performed at [facility], [address], [place], [place] [address]   MRSA PCR Screening     Status: Abnormal   Collection Time: 10/25/20  6:30 AM   Specimen: Nasal Mucosa; Nasopharyngeal  Result Value Ref Range Status   MRSA by PCR POSITIVE (A) NEGATIVE Final    Comment:        The GeneXpert MRSA Assay (FDA approved for NASAL specimens only), is one component of a comprehensive MRSA colonization surveillance program. It is not intended to diagnose MRSA infection nor to guide or monitor treatment for MRSA infections. RESULT CALLED TO, READ BACK BY AND VERIFIED WITH: FOLEY,B. AT 0845 BY HUFFINES,S ON 10/25/20. Performed at [facility], [address], [place], [place] [address]   Culture, blood (routine x 2)     Status: Abnormal   Collection Time: 10/25/20  8:06 AM   Specimen: BLOOD  Result Value Ref Range Status   Specimen Description   Final    BLOOD RIGHT ANTECUBITAL Performed at [facility], [address], [place], [place] [address]    Special Requests   Final    BOTTLES DRAWN AEROBIC AND ANAEROBIC Blood Culture adequate volume Performed at [facility], [address] [place], [place] [address]    Culture  Setup Time   Final    AEROBIC BOTTLE ONLY GRAM POSITIVE COCCI Gram Stain Report Called to,Read Back By and Verified With: W COUNCIL,RN@0200  10/26/20 MKELLY CRITICAL VALUE NOTED.  VALUE IS CONSISTENT WITH PREVIOUSLY REPORTED AND CALLED VALUE.    Culture (A)  Final     STAPHYLOCOCCUS AUREUS SUSCEPTIBILITIES PERFORMED ON PREVIOUS CULTURE WITHIN THE LAST 5 DAYS. Performed at [facility], [address] [place], [place] [address]    Report Status 10/28/2020 FINAL  Final  Culture, blood (routine x 2)     Status: Abnormal   Collection Time: 10/25/20  8:06 AM   Specimen: BLOOD RIGHT WRIST  Result Value Ref Range Status   Specimen Description   Final    BLOOD RIGHT WRIST Performed at [facility], [address] [place], [place] [address]    Special Requests   Final    BOTTLES DRAWN AEROBIC AND ANAEROBIC Blood Culture adequate volume Performed at [facility], [address], [place], [place] [address]    Culture  Setup Time   Final    AEROBIC BOTTLE ONLY GRAM POSITIVE COCCI Gram Stain Report Called to,Read Back By and Verified With: W COUNCIL,RN@2204  10/25/20 MKELLY CRITICAL RESULT CALLED TO, READ BACK BY AND VERIFIED WITH: PHARMD GREG ABBOTT 10/26/2020 AT 0440 A.HUGHES ANAEROBIC BOTTLE ALSO Performed at [facility], [address] [place], [place] [address]    Culture  METHICILLIN RESISTANT STAPHYLOCOCCUS AUREUS (A)  Final   Report Status 10/28/2020 FINAL  Final   Organism ID, Bacteria METHICILLIN RESISTANT STAPHYLOCOCCUS AUREUS  Final      Susceptibility   Methicillin resistant staphylococcus aureus - MIC*    CIPROFLOXACIN <=0.5 SENSITIVE Sensitive     ERYTHROMYCIN >=8 RESISTANT Resistant     GENTAMICIN <=0.5 SENSITIVE Sensitive     OXACILLIN >=4 RESISTANT Resistant     TETRACYCLINE >=16 RESISTANT Resistant     VANCOMYCIN <=0.5 SENSITIVE Sensitive     TRIMETH/SULFA <=10 SENSITIVE Sensitive     CLINDAMYCIN <=0.25 SENSITIVE Sensitive     RIFAMPIN <=0.5 SENSITIVE Sensitive     Inducible Clindamycin NEGATIVE Sensitive     * METHICILLIN RESISTANT STAPHYLOCOCCUS AUREUS  Blood Culture ID Panel (Reflexed)     Status: Abnormal   Collection Time: 10/25/20  8:06 AM  Result Value Ref Range Status   Enterococcus faecalis NOT DETECTED  NOT DETECTED Final   Enterococcus Faecium NOT DETECTED NOT DETECTED Final   Listeria monocytogenes NOT DETECTED NOT DETECTED Final   Staphylococcus species DETECTED (A) NOT DETECTED Final    Comment: RBV PHARMD GREG ABBOTT 10/26/2020 AT 0441 A.HUGHES    Staphylococcus aureus (BCID) DETECTED (A) NOT DETECTED Final    Comment: Methicillin (oxacillin)-resistant Staphylococcus aureus (MRSA). MRSA is predictably resistant to beta-lactam antibiotics (except ceftaroline). Preferred therapy is vancomycin unless clinically contraindicated. Patient requires contact precautions if  hospitalized. RBV PHARMD GREG ABBOTT 10/26/2020 AT 0440 A.HUGHES    Staphylococcus epidermidis NOT DETECTED NOT DETECTED Final   Staphylococcus lugdunensis NOT DETECTED NOT DETECTED Final   Streptococcus species NOT DETECTED NOT DETECTED Final   Streptococcus agalactiae NOT DETECTED NOT DETECTED Final   Streptococcus pneumoniae NOT DETECTED NOT DETECTED Final   Streptococcus pyogenes NOT DETECTED NOT DETECTED Final   A.calcoaceticus-baumannii NOT DETECTED NOT DETECTED Final   Bacteroides fragilis NOT DETECTED NOT DETECTED Final   Enterobacterales NOT DETECTED NOT DETECTED Final   Enterobacter cloacae complex NOT DETECTED NOT DETECTED Final   Escherichia coli NOT DETECTED NOT DETECTED Final   Klebsiella aerogenes NOT DETECTED NOT DETECTED Final   Klebsiella oxytoca NOT DETECTED NOT DETECTED Final   Klebsiella pneumoniae NOT DETECTED NOT DETECTED Final   Proteus species NOT DETECTED NOT DETECTED Final   Salmonella species NOT DETECTED NOT DETECTED Final   Serratia marcescens NOT DETECTED NOT DETECTED Final   Haemophilus influenzae NOT DETECTED NOT DETECTED Final   Neisseria meningitidis NOT DETECTED NOT DETECTED Final   Pseudomonas aeruginosa NOT DETECTED NOT DETECTED Final   Stenotrophomonas maltophilia NOT DETECTED NOT DETECTED Final   Candida albicans NOT DETECTED NOT DETECTED Final   Candida auris NOT DETECTED  NOT DETECTED Final   Candida glabrata NOT DETECTED NOT DETECTED Final   Candida krusei NOT DETECTED NOT DETECTED Final   Candida parapsilosis NOT DETECTED NOT DETECTED Final   Candida tropicalis NOT DETECTED NOT DETECTED Final   Cryptococcus neoformans/gattii NOT DETECTED NOT DETECTED Final   Meth resistant mecA/C and MREJ DETECTED (A) NOT DETECTED Final    Comment: PHARMD GREG ABBOTT 10/26/2020 AT 0441 A.HUGHES CRITICAL RESULT CALLED TO, READ BACK BY AND VERIFIED WITH: Performed at [facility], [address] [place], [place] [address]   Culture, blood (routine x 2)     Status: None   Collection Time: 10/27/20  6:00 AM   Specimen: BLOOD  Result Value Ref Range Status   Specimen Description BLOOD LEFT HAND  Final   Special Requests   Final  BOTTLES DRAWN AEROBIC AND ANAEROBIC Blood Culture adequate volume   Culture   Final    NO GROWTH 5 DAYS Performed at [facility], [address] [place], [place] [address]    Report Status 11/01/2020 FINAL  Final  Culture, blood (routine x 2)     Status: None   Collection Time: 10/27/20  6:05 AM   Specimen: BLOOD  Result Value Ref Range Status   Specimen Description BLOOD RIGHT FOREARM  Final   Special Requests   Final    BOTTLES DRAWN AEROBIC AND ANAEROBIC Blood Culture adequate volume   Culture   Final    NO GROWTH 5 DAYS Performed at [facility], [address], [place], [place] [address]    Report Status 11/01/2020 FINAL  Final    Pertinent Lab. CMP Latest Ref Rng & Units 11/04/2020 11/03/2020 11/02/2020  Glucose 70 - 99 mg/dL 73 35(W) 88  BUN 6 - 20 mg/dL 14 11 13   Creatinine 0.61 - 1.24 mg/dL ) 6.56(C 1.27  Sodium 135 - 145 mmol/L 141 137 136  Potassium 3.5 - 5.1 mmol/L 2.6(LL) 3.9 4.1  Chloride 98 - 111 mmol/L 116(H) 103 99  CO2 22 - 32 mmol/L 20(L) 25 28  Calcium 8.9 - 10.3 mg/dL 6.3(LL) 8.4(L) 8.1(L)  Total Protein 6.5 - 8.1 g/dL - - 5.8(L)  Total Bilirubin 0.3 - 1.2 mg/dL - - 1.8(H)  Alkaline Phos  38 - 126 U/L - - 64  AST 15 - 41 U/L - - 30  ALT 0 - 44 U/L - - 105(H)    Pertinent Imaging today Plain films and CT images have been personally visualized and interpreted; radiology reports have been reviewed. Decision making incorporated into the Impression / Recommendations.  I have spent more than 35 minutes for this patient encounter including review of prior medical records, coordination of care  with greater than 50% of time being face to face/counseling and discussing diagnostics/treatment plan with the patient/family.  Electronically signed by:   5.17, MD Infectious Disease Physician [facility] Pager: [phone]

## 2020-11-04 NOTE — Progress Notes (Signed)
Patient ID: Kyle Peck, male   DOB: 06/02/1961, 60 y.o.   MRN: [id number]     Advanced Heart Failure Rounding Note  PCP-Cardiologist: None   Subjective:    Transferred to [facility] from [facility] for shock treatment.  MRSA PNA/bacteremia, on vancomycin.  TEE 5/25: EF 20%, mild RV dysfunction, no endocarditis.  10/31/20 Extubated.   This morning, alert and oriented.  Denies dyspnea or chest pain.   LHC/RHC: Coronary Findings   Diagnostic Dominance: Right  Left Anterior Descending  Collaterals  Dist LAD filled by collaterals from 3rd RPL.    Prox LAD lesion is 100% stenosed.  Ramus Intermedius  Collaterals  Ramus filled by collaterals from 1st Mrg.    Ramus lesion is 100% stenosed.  Right Coronary Artery  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  Right Posterior Descending Artery  Collaterals  RPDA filled by collaterals from 1st RPL.    RPDA lesion is 100% stenosed.   Intervention   No interventions have been documented.  Right Heart  Right Heart Pressures RHC Procedural Findings: Hemodynamics (mmHg) RA mean 3 RV 22/1 PA 26/4, mean 17 PCWP mean 5 LV 82/10 AO 81/54  Oxygen saturations: PA 70% AO 99%  Cardiac Output (Fick) 5.24  Cardiac Index (Fick) 2.76   Cardiac Output (Thermo) 5.71  Cardiac Index (Thermo) 3.01     Objective:   Weight Range: 77.3 kg Body mass index is 26.7 kg/m.   Vital Signs:   Temp:  [97.6 F (36.4 C)-98.6 F (37 C)] 98.6 F (37 C) (06/02 0432) Pulse Rate:  [104] 104 (06/01 1201) Resp:  [19-22] 22 (06/02 0432) BP: (90-111)/(62-98) 109/75 (06/02 0432) SpO2:  [97 %-98 %] 98 % (06/02 0757) Weight:  [77.3 kg] 77.3 kg (06/02 0432) Last BM Date: 11/03/20  Weight change: Filed Weights   11/02/20 0321 11/03/20 0358 11/04/20 0432  Weight: 83.9 kg 78.2 kg 77.3 kg    Intake/Output:   Intake/Output Summary (Last 24 hours) at 11/04/2020 0910 Last data filed at 11/03/2020 2144 Gross per 24 hour  Intake 2506.36 ml  Output 800 ml  Net 1706.36  ml      Physical Exam   General: NAD Neck: No JVD, no thyromegaly or thyroid nodule.  Lungs: Clear to auscultation bilaterally with normal respiratory effort. CV: Nondisplaced PMI.  Heart regular S1/S2, no S3/S4, no murmur.  No peripheral edema.   Abdomen: Soft, nontender, no hepatosplenomegaly, no distention.  Skin: Intact without lesions or rashes.  Neurologic: Alert and oriented x 3.  Psych: Normal affect. Extremities: No clubbing or cyanosis.  HEENT: Normal.    Telemetry   NSR 90s    Labs    CBC Recent Labs    11/03/20 0357 11/04/20 0702  WBC 7.4 7.9  HGB 11.5* 12.2*  HCT 37.2* 39.0  MCV 88.4 85.9  PLT 354 446*   Basic Metabolic Panel Recent Labs    69/67/89 0357 11/04/20 0500  NA 137 141  K 3.9 2.6*  CL 103 116*  CO2 25 20*  GLUCOSE 68* 73  BUN 11 14  CREATININE 0.65 0.48*  CALCIUM 8.4* 6.3*  MG 2.1 1.2*  PHOS 4.4 3.1   Liver Function Tests Recent Labs    11/02/20 0623 11/03/20 0357 11/04/20 0500  AST 30  --   --   ALT 105*  --   --   ALKPHOS 64  --   --   BILITOT 1.8*  --   --   PROT 5.8*  --   --  ALBUMIN 2.7* 2.7* 1.8*   No results for input(s): LIPASE, AMYLASE in the last 72 hours. Cardiac Enzymes No results for input(s): CKTOTAL, CKMB, CKMBINDEX, TROPONINI in the last 72 hours.  BNP: BNP (last 3 results) Recent Labs    10/21/20 0033 10/23/20 0504  BNP 2,252.0* 1,685.0*    ProBNP (last 3 results) No results for input(s): PROBNP in the last 8760 hours.   D-Dimer No results for input(s): DDIMER in the last 72 hours. Hemoglobin A1C No results for input(s): HGBA1C in the last 72 hours. Fasting Lipid Panel No results for input(s): CHOL, HDL, LDLCALC, TRIG, CHOLHDL, LDLDIRECT in the last 72 hours. Thyroid Function Tests No results for input(s): TSH, T4TOTAL, T3FREE, THYROIDAB in the last 72 hours.  Invalid input(s): FREET3  Other results:   Imaging    CARDIAC CATHETERIZATION  Result Date: 11/04/2020  Prox RCA  lesion is 70% stenosed.  RPDA lesion is 100% stenosed.  Ramus lesion is 100% stenosed.  Prox LAD lesion is 100% stenosed.  1. Low filling pressures. 2. Preserved cardiac output. 3. The ostial PDA, proximal LAD, and mid ramus are totally occluded with collaterals.  Will evaluate for CABG.  Arrange MRI viability study.   DG HIP UNILAT WITH PELVIS 2-3 VIEWS LEFT  Result Date: 11/03/2020 CLINICAL DATA:  Recent fall EXAM: DG HIP (WITH OR WITHOUT PELVIS) 2-3V LEFT COMPARISON:  None. FINDINGS: No acute fracture or dislocation. No aggressive osseous lesion. Normal alignment. Generalized osteopenia. Soft tissue are unremarkable. No radiopaque foreign body or soft tissue emphysema. IMPRESSION: No acute osseous injury of the left hip. Given the patient's age and osteopenia, if there is persistent clinical concern for an occult hip fracture, a MRI of the hip is recommended for increased sensitivity. Electronically Signed   By: Elige Ko   On: 11/03/2020 10:01   DG HIP UNILAT WITH PELVIS 2-3 VIEWS RIGHT  Result Date: 11/03/2020 CLINICAL DATA:  Recent fall. EXAM: DG HIP (WITH OR WITHOUT PELVIS) 2-3V RIGHT COMPARISON:  Abdomen 10/26/2020. FINDINGS: Degenerative changes lumbar spine and both hips. No acute bony or joint abnormality identified. No evidence of fracture or dislocation. IMPRESSION: Degenerative changes lumbar spine and both hips. Electronically Signed   By: Maisie Fus  Register   On: 11/03/2020 10:04     Medications:     Scheduled Medications: . aspirin  81 mg Oral Daily  . atorvastatin  40 mg Oral Daily  . [MAR Hold] chlordiazePOXIDE  10 mg Oral TID  . [MAR Hold] Chlorhexidine Gluconate Cloth  6 each Topical Daily  . [MAR Hold] digoxin  0.125 mg Oral Daily  . [MAR Hold] docusate sodium  100 mg Oral BID  . [MAR Hold] enoxaparin (LOVENOX) injection  40 mg Subcutaneous Q24H  . [MAR Hold] feeding supplement  237 mL Oral TID BM  . [MAR Hold] folic acid  1 mg Oral Daily  . losartan  12.5 mg Oral  Daily  . [MAR Hold] multivitamin with minerals  1 tablet Oral Daily  . [MAR Hold] pantoprazole (PROTONIX) IV  40 mg Intravenous Q24H  . [MAR Hold] polyethylene glycol  17 g Oral BID  . potassium chloride  40 mEq Oral Once  . [MAR Hold] QUEtiapine  100 mg Oral BID  . [MAR Hold] sodium chloride flush  10-40 mL Intracatheter Q12H  . [MAR Hold] sodium chloride flush  3 mL Intravenous Q12H  . [MAR Hold] sodium chloride flush  3 mL Intravenous Q12H  . [MAR Hold] spironolactone  12.5 mg Oral Daily  . [  MAR Hold] temazepam  30 mg Oral QHS  . [MAR Hold] thiamine  100 mg Oral Daily   Or  . [MAR Hold] thiamine  100 mg Intravenous Daily    Infusions: . sodium chloride Stopped (11/01/20 0821)  . sodium chloride    . sodium chloride    . sodium chloride 999 mL/hr at 11/04/20 0831  . magnesium sulfate bolus IVPB    . [MAR Hold] oritavancin (ORBACTIV) IVPB    . [MAR Hold] vancomycin 1,250 mg (11/03/20 2126)    PRN Medications: sodium chloride, sodium chloride, [MAR Hold] acetaminophen (TYLENOL) oral liquid 160 mg/5 mL, [MAR Hold] diphenhydrAMINE, [MAR Hold] guaiFENesin-dextromethorphan, [MAR Hold] haloperidol lactate, iohexol, [MAR Hold] ondansetron (ZOFRAN) IV, [MAR Hold] sodium chloride flush, sodium chloride flush, sodium chloride flush     Assessment/Plan   1. Shock: At initial presentation.  Suspect mixed cardiogenic and septic with MRSA on blood cultures. Now resolved.  2. Acute systolic CHF: Echo this admission with EF 15%, moderate LV dilation, mildly decreased RV function, mild MR. Ischemic cardiomyopathy based on 6/2 cath. Presentation consistent with CHF, not ACS.  HS-TnI in setting of hypotension/shock was 204 => 141. Now off CVVH (stopped 5/26). Now off dobutamine with good cardiac output on cath today.  Filling pressures low on cath.  - Gentle post-op hydration and hold Lasix.  - Continue losartan but decrease to 12.5 mg daily with soft BP.  - Continue spironolactone 12.5 daily.    - Continue digoxin 0.125 daily.  - Will need consideration for eventual CABG, see below.  3. AKI: Creatinine up to 2.09 with persistent hyperkalemia and minimal UOP, required CVVH. Cr now normal and off CVVH. 4. ETOH abuse with withdrawal.  - IM managing 5. Cocaine abuse: Reports not regular.  6. Acute hypoxemic respiratory failure: Initially in setting of ETOH withdrawal and altered mental status. Extubated 10/31/20 7. ID: MRSA in blood, ?PNA source.  PCT 1.5 => 1.04.  TEE 5/25 showed no endocarditis.  - Continue vancomycin, ID recommend total of 4 wks.  8. NSVT - keep K> 4.0 Mg > 2.0 9. AMS/ Acute Delirium: Stroke w/u negative (MRI, CT head). EEG unremarkable. Resolved.  10. CAD: Cath 6/2 with occluded ostial PDA, mid ramus, and proximal LAD, all with collaterals.  No chest pain, presentation with CHF.  - ASA 81 - statin - Cardiac MRI for viability assessment.  - Will ask TCTS to consult.  Timing of CABG will be complicated by history of MRSA bacteremia.   Marca Ancona, 11/04/2020 9:10 AM

## 2020-11-04 NOTE — Progress Notes (Addendum)
Received notification from nursing staff that patient threatening to leave AMA.  Went to bedside and encouraged pt to stay at least another night per MD recommendations. Unable to reason w/ pt. I personally spoke w/ Pt's wife and brother. They also encouraged him to stay but he is adamant that he his leaving today.   I have notified CT surgery. ID will management outpatient abx.  LifeVest Order and [employer] rep notified. They can fit at home if pt decides to leave today.  [facility] f/u arranged in AVS He will f/u w/ CT surgery and ID as outpatient     Recommended Cardiac  Meds   ASA 81 mg  Lipitor 40 mg Digoxin 0.125 mg  Losartan 12.5 mg Spiro 12.5 mg  Lasix 20 mg PRN for wt gain > 3 lb   Robbie Lis, PA-C

## 2020-11-04 NOTE — Progress Notes (Signed)
PT Cancellation Note  Patient Details Name: Teran Knittle MRN: [id number] DOB: 1960/10/14   Cancelled Treatment:    Reason Eval/Treat Not Completed: Patient at procedure or test/unavailable. Will follow-up for PT treatment as schedule permits.  Ina Homes, PT, DPT Acute Rehabilitation Services  Pager [phone] Office [phone]  Malachy Chamber 11/04/2020, 7:31 AM

## 2020-11-04 NOTE — Progress Notes (Signed)
Received call from lab of critical potassium 2.6 and critical calcium 6.3. Notified Dr. Blake Divine. Patient is currently off the floor in cardiac cath lab. Per Dr. Blake Divine, redraw labs as soon as possible to confirm accuracy.

## 2020-11-04 NOTE — Care Management (Addendum)
11-04-20 1625 Information to be submitted to [employer] for Life Vest for this patient. Case Manager discussed case with Director of Transitions of Care. Patient is without insurance and the life vest had to be approved via leadership. Antibiotic Rx for Zyvox to be sent to [facility]- Case Manager received a credit card number via telephone and provided it to [facility] for payment. No further needs identified from Case Manager at this time.  11-04-20 1632 Patient will need to wait to be fitted for Life Vest.

## 2020-11-04 NOTE — Progress Notes (Signed)
Occupational Therapy Treatment Patient Details Name: Kyle Peck MRN: [id number] DOB: [birth year] Today's Date: 11/04/2020    History of present illness Pt is a 60 y.o. male admitted 10/20/20 with LE/abdominal swelling. Workup for HF, cardiogenic shock. Echo showed EF 15% with global hypokinesis. Transfer to ICU 5/22, s/p HD cath placement for CRRT initiation. ETT 5/23-5/29. Course complicated by AKI, ETOH withdrawal, hypotension requiring pressors. CRRT discontinued 5/28. Code stroke called prior to cardiac cath on 5/31 due to abnormal speech. 6/2 pt had cardiac cath completed, awaiting cardiac MRI for next steps; head CT negative, limited MRI negative. PMH includes polysubstance abuse.   OT comments  Pt progressing well towards OT goals. This session pt wanted to focus on mobility OOB. Pt completed transfers x3, standing at sink for 3+ mins, and ambulating in the hall. Pt required supervision to min guard for all activities, for safety. Pt was very motivated and agreeable to OT session, however at times was slightly impulsive requiring cues to slow down and wait or acknowledge his safety.    Follow Up Recommendations  No OT follow up;Supervision/Assistance - 24 hour    Equipment Recommendations  Other (comment) (RW)    Recommendations for Other Services      Precautions / Restrictions Precautions Precautions: Fall Restrictions Weight Bearing Restrictions: No       Mobility Bed Mobility Overal bed mobility: Modified Independent             General bed mobility comments: No difficulties exiting bed, left sitting EOB.    Transfers Overall transfer level: Needs assistance Equipment used: Rolling walker (2 wheeled) Transfers: Sit to/from Stand Sit to Stand: Min guard         General transfer comment: Min guard for safety, sit<>stand x3    Balance Overall balance assessment: Mild deficits observed, not formally tested                                          ADL either performed or assessed with clinical judgement   ADL Overall ADL's : Needs assistance/impaired     Grooming: Supervision/safety;Standing;Oral care;Wash/dry face Grooming Details (indicate cue type and reason): completed at sink, sup for safety                 Toilet Transfer: Min guard;Ambulation Toilet Transfer Details (indicate cue type and reason): Min guard for safety         Functional mobility during ADLs: Min guard;Rolling walker General ADL Comments: Pt very cooperative this session, however at times impulsive, requiring cues to slow down and attend to his safety. No physical assist required this session only sup-min guard for safety     Vision       Perception     Praxis      Cognition Arousal/Alertness: Awake/alert Behavior During Therapy: WFL for tasks assessed/performed;Flat affect Overall Cognitive Status: No family/caregiver present to determine baseline cognitive functioning                                 General Comments: Pt believes that he is going home today for a wedding and that he will be back on monday.        Exercises Exercises: Other exercises Other Exercises Other Exercises: kick outs, seated, BLE, x10 Other Exercises: Punch outs, seated, BUE, 10 reps   Shoulder  Instructions       General Comments VSS on RA, HR maintainede 115-119 throughout entire treatment.    Pertinent Vitals/ Pain       Pain Assessment: No/denies pain  Home Living                                          Prior Functioning/Environment              Frequency  Min 2X/week        Progress Toward Goals  OT Goals(current goals can now be found in the care plan section)  Progress towards OT goals: Progressing toward goals  Acute Rehab OT Goals Patient Stated Goal: return home OT Goal Formulation: With patient Time For Goal Achievement: 11/15/20 Potential to Achieve Goals: Good ADL Goals Pt  Will Perform Grooming: with modified independence;standing Pt Will Perform Lower Body Dressing: with modified independence;sit to/from stand;sitting/lateral leans Pt Will Transfer to Toilet: with modified independence;ambulating;regular height toilet Additional ADL Goal #1: Pt to attend to functional task > 5 min with min verbal cues  Plan Discharge plan remains appropriate;Frequency remains appropriate    Co-evaluation                 AM-PAC OT "6 Clicks" Daily Activity     Outcome Measure   Help from another person eating meals?: A Little Help from another person taking care of personal grooming?: A Little Help from another person toileting, which includes using toliet, bedpan, or urinal?: A Little Help from another person bathing (including washing, rinsing, drying)?: A Little Help from another person to put on and taking off regular upper body clothing?: A Little Help from another person to put on and taking off regular lower body clothing?: A Little 6 Click Score: 18    End of Session Equipment Utilized During Treatment: Rolling walker  OT Visit Diagnosis: Other abnormalities of gait and mobility (R26.89);Unsteadiness on feet (R26.81);Muscle weakness (generalized) (M62.81)   Activity Tolerance Patient tolerated treatment well   Patient Left in bed;with call bell/phone within reach;with bed alarm set   Nurse Communication Mobility status        Time: 6720-9470 OT Time Calculation (min): 23 min  Charges: OT General Charges $OT Visit: 1 Visit OT Treatments $Self Care/Home Management : 23-37 mins  Shaily Librizzi H., OTR/L Acute Rehabilitation  Yvone Slape Elane Sandon Yoho 11/04/2020, 12:44 PM

## 2020-11-04 NOTE — Progress Notes (Signed)
HOSPITAL MEDICINE OVERNIGHT EVENT NOTE    Notified by nursing that patient's cooximeter panel performed this morning reveals a hemoglobin of 6.5, a dramatic decrease in hemoglobin from 11.9 yesterday.  Patient exhibits no clinical evidence of gross bleeding to substantiate this extremely large drop.  I am doubtful of the accuracy of this hemoglobin and therefore a stat CBC is being performed now as well as a type and screen.  Day provider to be notified by the nursing of the results of this pending CBC in case intervention is to be taken.  Marinda Elk  MD [facility]

## 2020-11-04 NOTE — Progress Notes (Signed)
PROGRESS NOTE    Kyle Peck  XIP:[id number] DOB: [birth year] DOA: 10/20/2020 PCP: Pcp, No    Chief Complaint  Patient presents with  . Joint Swelling    Brief Narrative:   60 year old EtOH and cocaine user initially admitted to [facility] on 10/21/2019 for abdominal swelling and altered mental status..  Echocardiogram showed left ventricular ejection fraction of 15% with global hypokinesis.  He was treated for alcohol withdrawal with Ativan and transferred to ICU and placed on Precedex, eventually required intubation.  He subsequently became hypotensive requiring vasopressors.  He also developed AKI and hyperkalemia and he was on [facility] service.  He was extubated on 10/31/2020 and weaned off Precedex on 11/01/2020 and transferred to [facility] on 11/02/2020. Patient was scheduled for cardiac catheterization on 11/02/2020 but had to be aborted as patient became confused. Code stroke was called and his subsequent CT of the head and MRI were negative for acute stroke. EEG was done negative for any epileptiform activity.  It was thought to be secondary to ICU delirium. Pt underwent cardiac cath on 11/04/20, showed multi vessel disease, recommended CTVS consultation for CABG. He will need cardiac MRI for viability.   Assessment & Plan:   Principal Problem:   Elevated brain natriuretic peptide (BNP) level Active Problems:   CHF (congestive heart failure) (HCC)   Transaminitis   Thrombocytosis   Hypoalbuminemia due to protein-calorie malnutrition (HCC)   Tobacco use   Pressure injury of skin   Acute respiratory failure (HCC)   Cardiogenic shock (HCC)   Hyperkalemia   Acute renal failure (ARF) (HCC)   Acute encephalopathy   Polysubstance abuse ([place])   ETOH abuse   MRSA bacteremia   Cocaine abuse ([place])   Septic shock secondary to MRSA bacteremia/MRSA pneumonia TTE and TEE are negative. Cardiogenic shock in the setting of EtOH abuse/cardiomyopathy Patient was weaned off vasopressors on 10/31/2020  and extubated on 10/31/2020.  Duration of IV vancomycin for MRSA bacteremia as per ID. 4 weeks of IV antibiotics from 10/27/20 vs 2 weeks of IV antibiotics and one dose of Oritavancin to be given on discharge.  Septic physiology improving    Acute respiratory failure with hypoxia in the setting of acute systolic heart failure and severe EtOH withdrawal. Extubated on 10/31/2020.   Acute metabolic encephalopathy in the setting of polysubstance abuse and EtOH abuse UDS is positive for cocaine benzodiazepines and THC on admission Patient has been on Precedex and intubated, extubated on 5/29. Continue with Seroquel and Librium at current dosing and Restoril at bedtime, patient would like to hold on Restoril at bedtime as it is making him confused and sleepy in the morning.  We will hold the Restoril for now Continue with IV Haldol as needed for agitation.  Avoid benzodiazepines as it is making him confused Vitamin B12 are adequate and RPR is non reactive.  Pt is more alert and oriented and answering questions appropriately this am.     Acute systolic heart failure Last echocardiogram showed left ventricular ejection fraction of 15% with moderate LV dilation probably secondary to a combination of EtOH abuse, cocaine, CAD cannot be ruled out. Cardiology on board and appreciate recommendations.   Underwent LHC today showing multi vessel disease, recommended CTVS consultation for CABG. He will need cardiac MRI for viability.      Pressure injury buttocks present on admission Pressure Injury 10/25/20 Buttocks Bilateral;Mid Deep Tissue Pressure Injury - Purple or maroon localized area of discolored intact skin or blood-filled blister due to damage of  underlying soft tissue from pressure and/or shear. (Active)  10/25/20 1715  Location: Buttocks  Location Orientation: Bilateral;Mid  Staging: Deep Tissue Pressure Injury - Purple or maroon localized area of discolored intact skin or blood-filled  blister due to damage of underlying soft tissue from pressure and/or shear.  Wound Description (Comments):   Present on Admission: Yes     Pressure Injury 10/25/20 Buttocks Left Stage 2 -  Partial thickness loss of dermis presenting as a shallow open injury with a red, pink wound bed without slough. (Active)  10/25/20 1715  Location: Buttocks  Location Orientation: Left  Staging: Stage 2 -  Partial thickness loss of dermis presenting as a shallow open injury with a red, pink wound bed without slough.  Wound Description (Comments):   Present on Admission: Yes   Wound care consulted and appreciate recommendations.    AKI Probably secondary to septic shock, which has resolved.  Renal parameters appear to be optimal    Delirium this morning initially thought to be code stroke CT of the head and MRI of the brain and negative for acute stroke. Patient continues to be confused probably ICU delirium. Patient weaned off Precedex prior to transfer to the floor Patient currently alert and oriented to person and place and clearer than yesterday     Polysubstance abuse/EtOH abuse TOC consulted for recommendations.  And resources    Transaminitis AST is within normal limits ALT is 105 total bilirubin elevated at 1.8, alk phos is 64.  Continue to follow.   Hypokalemia, hypomagnesemia:  Probably lab error, recheck labs this morning.     DVT prophylaxis: (Lovenox) Code Status: (Full code.  Family Communication:none at bedside.  Disposition:   Status is: Inpatient  Remains inpatient appropriate because:Ongoing diagnostic testing needed not appropriate for outpatient work up   Dispo: The patient is from: Home              Anticipated d/c is to: Home              Patient currently is not medically stable to d/c.   Difficult to place patient No       Consultants:   Cardiology.    Procedures: none.   Antimicrobials:  Antibiotics Given (last 72 hours)    Date/Time  Action Medication Dose Rate   11/01/20 1208 New Bag/Given   vancomycin (VANCOREADY) IVPB 1250 mg/250 mL 1,250 mg 166.7 mL/hr   11/01/20 2110 New Bag/Given   vancomycin (VANCOREADY) IVPB 1250 mg/250 mL 1,250 mg 166.7 mL/hr   11/02/20 0340 New Bag/Given   vancomycin (VANCOREADY) IVPB 1250 mg/250 mL 1,250 mg 166.7 mL/hr   11/02/20 1257 New Bag/Given   vancomycin (VANCOREADY) IVPB 1250 mg/250 mL 1,250 mg 166.7 mL/hr   11/02/20 2105 New Bag/Given   vancomycin (VANCOREADY) IVPB 1250 mg/250 mL 1,250 mg 166.7 mL/hr   11/03/20 0304 New Bag/Given   vancomycin (VANCOREADY) IVPB 1250 mg/250 mL 1,250 mg 166.7 mL/hr   11/03/20 1041 New Bag/Given   vancomycin (VANCOREADY) IVPB 1250 mg/250 mL 1,250 mg 166.7 mL/hr   11/03/20 2126 New Bag/Given   vancomycin (VANCOREADY) IVPB 1250 mg/250 mL 1,250 mg 166.7 mL/hr       Subjective: No new complaints.    Objective: Vitals:   11/04/20 0015 11/04/20 0432 11/04/20 0757 11/04/20 0916  BP: 90/62 109/75  (!) 131/100  Pulse:      Resp: (!) 21 (!) 22  16  Temp: 98 F (36.7 C) 98.6 F (37 C)  98.3 F (36.8  C)  TempSrc: Oral Oral  Oral  SpO2: 98% 97% 98% 98%  Weight:  77.3 kg    Height:        Intake/Output Summary (Last 24 hours) at 11/04/2020 0928 Last data filed at 11/03/2020 2144 Gross per 24 hour  Intake 2506.36 ml  Output 800 ml  Net 1706.36 ml   Filed Weights   11/02/20 0321 11/03/20 0358 11/04/20 0432  Weight: 83.9 kg 78.2 kg 77.3 kg    Examination:  General exam: alert and comfortable.  Respiratory system: clear to auscultation, no wheezing heard.  Cardiovascular system: S1-S2 heard, RRR, no JVD, no pedal edema.  Gastrointestinal system: Abdomen is soft, nT ND BS+ Central nervous system: ALERT , oriented to person , place and time. No focal deficits.  Extremities:  No pedal edema.  Skin: stge 2 sacral decubitus ulcer.  Psychiatry:  Angry, agitated.     Data Reviewed: I have personally reviewed following labs and imaging  studies  CBC: Recent Labs  Lab 11/02/20 0623 11/03/20 0357 11/04/20 0702  WBC 7.6 7.4 7.9  HGB 12.1* 11.5* 12.2*  HCT 38.1* 37.2* 39.0  MCV 86.0 88.4 85.9  PLT 298 354 446*    Basic Metabolic Panel: Recent Labs  Lab 10/31/20 0326 11/01/20 0335 11/01/20 1849 11/02/20 0623 11/03/20 0357 11/04/20 0500  NA 136 138 134* 136 137 141  K 3.9 3.3* 3.8 4.1 3.9 2.6*  CL 99 98 98 99 103 116*  CO2 32 34* '30 28 25 ' 20*  GLUCOSE 137* 77 120* 88 68* 73  BUN 26* '19 16 13 11 14  ' CREATININE 0.56* 0.68 0.75 0.67 0.65 0.48*  CALCIUM 8.1* 8.3* 8.0* 8.1* 8.4* 6.3*  MG 1.9 1.7 2.3 2.0 2.1 1.2*  PHOS 4.8* 3.9  --  3.7 4.4 3.1    GFR: Estimated Creatinine Clearance: 91.8 mL/min (A) (by C-G formula based on SCr of 0.48 mg/dL (L)).  Liver Function Tests: Recent Labs  Lab 10/31/20 0326 11/01/20 0335 11/02/20 0623 11/03/20 0357 11/04/20 0500  AST  --   --  30  --   --   ALT  --   --  105*  --   --   ALKPHOS  --   --  64  --   --   BILITOT  --   --  1.8*  --   --   PROT  --   --  5.8*  --   --   ALBUMIN 2.4* 2.6* 2.7* 2.7* 1.8*    CBG: Recent Labs  Lab 10/31/20 0718 10/31/20 1135 11/01/20 0727 11/01/20 1126 11/02/20 0747  GLUCAP 121* 117* 85 115* 89     Recent Results (from the past 240 hour(s))  Culture, blood (routine x 2)     Status: None   Collection Time: 10/27/20  6:00 AM   Specimen: BLOOD  Result Value Ref Range Status   Specimen Description BLOOD LEFT HAND  Final   Special Requests   Final    BOTTLES DRAWN AEROBIC AND ANAEROBIC Blood Culture adequate volume   Culture   Final    NO GROWTH 5 DAYS Performed at [facility], [place] [address], [place], [place] [address]    Report Status 11/01/2020 FINAL  Final  Culture, blood (routine x 2)     Status: None   Collection Time: 10/27/20  6:05 AM   Specimen: BLOOD  Result Value Ref Range Status   Specimen Description BLOOD RIGHT FOREARM  Final   Special Requests  Final    BOTTLES DRAWN AEROBIC AND ANAEROBIC  Blood Culture adequate volume   Culture   Final    NO GROWTH 5 DAYS Performed at [facility], [place] [address], [place], [place] [address]    Report Status 11/01/2020 FINAL  Final         Radiology Studies: CARDIAC CATHETERIZATION  Result Date: 11/04/2020  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  RPDA lesion is 100% stenosed.  Ramus lesion is 100% stenosed.  Prox LAD lesion is 100% stenosed.  1. Low filling pressures. 2. Preserved cardiac output. 3. The ostial PDA, proximal LAD, and mid ramus are totally occluded with collaterals.  Will evaluate for CABG.  Arrange MRI viability study.   EEG adult  Result Date: 11/02/2020 Lora Havens, MD     11/02/2020  6:08 PM Patient Name: Kyle Peck MRN: [id number] Epilepsy Attending: Lora Havens Referring Physician/Provider: Dr Lesleigh Noe Date: 11/02/2020 Duration: 22.31 mins Patient history: 60 year old male who had acute onset of perseveration followed by decreased verbalization with grunting this morning prior to cardiac catheterization. EEG to evaluate for seizure Level of alertness: Awake, drowsy AEDs during EEG study: Temazepam Technical aspects: This EEG study was done with scalp electrodes positioned according to the 10-20 International system of electrode placement. Electrical activity was acquired at a sampling rate of '500Hz'  and reviewed with a high frequency filter of '70Hz'  and a low frequency filter of '1Hz' . EEG data were recorded continuously and digitally stored. Description: The posterior dominant rhythm consists of 9 Hz activity of moderate voltage (25-35 uV) seen predominantly in posterior head regions, symmetric and reactive to eye opening and eye closing. Drowsiness was characterized by attenuation of the posterior background rhythm. Hyperventilation and photic stimulation were not performed.   IMPRESSION: This study is within normal limits. No seizures or epileptiform discharges were seen throughout the recording. [place]   DG HIP UNILAT WITH PELVIS 2-3 VIEWS LEFT  Result Date: 11/03/2020 CLINICAL DATA:  Recent fall EXAM: DG HIP (WITH OR WITHOUT PELVIS) 2-3V LEFT COMPARISON:  None. FINDINGS: No acute fracture or dislocation. No aggressive osseous lesion. Normal alignment. Generalized osteopenia. Soft tissue are unremarkable. No radiopaque foreign body or soft tissue emphysema. IMPRESSION: No acute osseous injury of the left hip. Given the patient's age and osteopenia, if there is persistent clinical concern for an occult hip fracture, a MRI of the hip is recommended for increased sensitivity. Electronically Signed   By: Kathreen Devoid   On: 11/03/2020 10:01   DG HIP UNILAT WITH PELVIS 2-3 VIEWS RIGHT  Result Date: 11/03/2020 CLINICAL DATA:  Recent fall. EXAM: DG HIP (WITH OR WITHOUT PELVIS) 2-3V RIGHT COMPARISON:  Abdomen 10/26/2020. FINDINGS: Degenerative changes lumbar spine and both hips. No acute bony or joint abnormality identified. No evidence of fracture or dislocation. IMPRESSION: Degenerative changes lumbar spine and both hips. Electronically Signed   By: Marcello Moores  Register   On: 11/03/2020 10:04        Scheduled Meds: . Derrill Memo ON 11/05/2020] aspirin  81 mg Oral Daily  . atorvastatin  40 mg Oral Daily  . chlordiazePOXIDE  10 mg Oral TID  . Chlorhexidine Gluconate Cloth  6 each Topical Daily  . digoxin  0.125 mg Oral Daily  . docusate sodium  100 mg Oral BID  . enoxaparin (LOVENOX) injection  40 mg Subcutaneous Q24H  . [START ON 11/05/2020] enoxaparin (LOVENOX) injection  40 mg Subcutaneous Q24H  . feeding supplement  237 mL Oral TID  BM  . folic acid  1 mg Oral Daily  . losartan  12.5 mg Oral Daily  . multivitamin with minerals  1 tablet Oral Daily  . pantoprazole (PROTONIX) IV  40 mg Intravenous Q24H  . polyethylene glycol  17 g Oral BID  . potassium chloride  40 mEq Oral Once  . QUEtiapine  100 mg Oral BID  . sodium chloride flush  10-40 mL Intracatheter Q12H  . sodium chloride flush  3 mL  Intravenous Q12H  . sodium chloride flush  3 mL Intravenous Q12H  . sodium chloride flush  3 mL Intravenous Q12H  . spironolactone  12.5 mg Oral Daily  . temazepam  30 mg Oral QHS  . thiamine  100 mg Oral Daily   Or  . thiamine  100 mg Intravenous Daily   Continuous Infusions: . sodium chloride Stopped (11/01/20 0821)  . sodium chloride 75 mL/hr at 11/04/20 0917  . sodium chloride    . magnesium sulfate bolus IVPB    . [START ON 11/11/2020] oritavancin (ORBACTIV) IVPB    . vancomycin 1,250 mg (11/03/20 2126)     LOS: 14 days        Hosie Poisson, MD [facility]   To contact the attending provider between 7A-7P or the covering provider during after hours 7P-7A, please log into the web site [email or web address] and access using universal [place] password for that web site. If you do not have the password, please call the hospital operator.  11/04/2020, 9:28 AM    Addendum:   Pt wants to leave AMA.  ID requested to give him Oritavancin , followed by zyvox for 2 weeks.   Hosie Poisson,

## 2020-11-04 NOTE — Progress Notes (Signed)
Came to visit patient at request nursing team and patient demanding to leave against medical advice (AMA). Patient is alert and in his right mind per my assessment and is demanding to go home.  I have been direct with him in what leaving AMA means for him and the type of care that is required for him at this time.  My self and RN Minna Antis have both attempted to negotiate with him to stay and await the consult his cardiology team wants for him to have a plan to move forward.     Patients wife and daughter are present during this discussion and agree patient is not confused at this time and even though they do not agree with him coming home believe he is in his right mind.    At this time patient is agreeable to await a shot of antibiotics before leaving AMA.    Jacqulyn Cane RN, BSN, CCRN

## 2020-11-04 NOTE — Consult Note (Signed)
[address]       [place] [address]             [phone]        [facility] Medical Record #[id number] Date of Birth: 1961/02/13  Referring: No ref. provider found Primary Care: Pcp, No Primary Cardiologist:None  Chief Complaint:    Chief Complaint  Patient presents with  . Joint Swelling    History of Present Illness: The patient is a 60 year old male who initially presented at a another hospital with a mixed picture of cardiogenic and septic shock.  He was found to have MRSA on blood cultures.  He originally presented on 10/21/2020 and this has resolved over time.  Initial echocardiogram showed an ejection fraction of 15%, moderate LV dilation, mildly decreased RV function, and mild mitral regurgitation cardiology has been involved in his care and he has subsequently gone for cardiac catheterization on 11/04/2020 where he was found to have ischemic cardiomyopathy.  Initial presentation was felt most consistent with CHF and not ACS.  His initial high-sensitivity troponin high in the setting of hypotension/shock was 204 and peaked at 141.  He developed renal insufficiency and required CVVH which has subsequently been stopped as of 526.  On cardiac catheterization he was off dobutamine and cardiac index was noted to be good.  Filling pressures were noted to be low at the time of catheterization.  For full report of the catheterization please see below.  He is noted to have an occluded ostial PDA, mild ramus, and proximal LAD, all with collaterals.  Creatinine peaked at 2.09 with minimal urine output but this has improved and as stated again he is now off CVVH with normal creatinine.  He does have a history of alcohol abuse with withdrawal which has been managed by the internal medicine service.  He also has a history of cocaine abuse but not a regular user by report.  Early in the hospitalization he did require ventilatory support due to acute hypoxemic  respiratory failure in the setting of EtOH withdrawal and altered mental status but was extubated on 10/31/2020.  TEE was done on 525 and showed no endocarditis.  He has been on intravenous vancomycin which is recommended a full 4-week course by infectious disease.  He has also had some NSVT and cardiology is being very careful keeping electrolytes within normal limits.  He did have an episode yesterday of aphasia and code stroke was activated.  He has had a negative work-up including MRI, CT of the head and EEG.  It was felt that those findings may be consistent with an acute delirium.  An MRI cardiac viability study has been ordered.  Results are currently pending.  We are asked to see the patient in cardiothoracic surgical consultation for consideration of revascularization.  This result (MRI) will be very important in risk stratification.  Also the MRSA bacteremia will somewhat also complicate timing.    Current Activity/ Functional Status: Patient is independent with mobility/ambulation, transfers, ADL's, IADL's.   Zubrod Score: At the time of surgery this patient's most appropriate activity status/level should be described as: []     0    Normal activity, no symptoms []     1    Restricted in physical strenuous activity but ambulatory, able to do out light work []     2    Ambulatory and capable of self care, unable to do work activities, up and about  more than 50%  Of the time                                3    Only limited self care, in bed greater than 50% of waking hours     4    Completely disabled, no self care, confined to bed or chair     5    Moribund  Past Medical History:  Diagnosis Date  . Alcohol use   . Arthritis   . Marijuana use   . Tobacco abuse     Past Surgical History:  Procedure Laterality Date  . FRACTURE SURGERY     cheek     Social History   Tobacco Use  Smoking Status Current Some Day Smoker  . Packs/day: 0.50  . Types: Cigars,  Cigarettes  Smokeless Tobacco Never Used    Social History   Substance and Sexual Activity  Alcohol Use Yes  . Alcohol/week: 15.0 standard drinks  . Types: 15 Shots of liquor per week   Comment: intermittently     No Known Allergies  Current Facility-Administered Medications  Medication Dose Route Frequency Provider Last Rate Last Admin  . 0.9 %  sodium chloride infusion  250 mL Intravenous Continuous Sherryll Burger, Pratik D, DO   Stopped at 11/01/20 [id number]  . 0.9 %  sodium chloride infusion   Intravenous Continuous Laurey Morale, MD 75 mL/hr at 11/04/20 9604 Rate Change at 11/04/20 0917  . 0.9 %  sodium chloride infusion  250 mL Intravenous PRN Laurey Morale, MD      . acetaminophen (TYLENOL) tablet 650 mg  650 mg Oral Q4H PRN Laurey Morale, MD      . Melene Muller ON 11/05/2020] aspirin chewable tablet 81 mg  81 mg Oral Daily Kathlen Mody, MD      . atorvastatin (LIPITOR) tablet 40 mg  40 mg Oral Daily Laurey Morale, MD      . chlordiazePOXIDE (LIBRIUM) capsule 10 mg  10 mg Oral TID Audie Box L, DO   10 mg at 11/03/20 2117  . Chlorhexidine Gluconate Cloth 2 % PADS 6 each  6 each Topical Daily Icard, Bradley L, DO   6 each at 11/04/20 0601  . digoxin (LANOXIN) tablet 0.125 mg  0.125 mg Oral Daily Bensimhon, Bevelyn Buckles, MD   0.125 mg at 11/03/20 1206  . diphenhydrAMINE (BENADRYL) capsule 25 mg  25 mg Oral Q6H PRN Icard, Bradley L, DO      . docusate sodium (COLACE) capsule 100 mg  100 mg Oral BID Icard, Bradley L, DO   100 mg at 11/03/20 2117  . [START ON 11/05/2020] enoxaparin (LOVENOX) injection 40 mg  40 mg Subcutaneous Q24H Laurey Morale, MD      . feeding supplement (ENSURE ENLIVE / ENSURE PLUS) liquid 237 mL  237 mL Oral TID BM Kathlen Mody, MD   237 mL at 11/03/20 2117  . folic acid (FOLVITE) tablet 1 mg  1 mg Oral Daily Icard, Bradley L, DO   1 mg at 11/03/20 1206  . guaiFENesin-dextromethorphan (ROBITUSSIN DM) 100-10 MG/5ML syrup 5 mL  5 mL Oral Q4H PRN Lorin Glass, MD   5  mL at 11/01/20 1202  . haloperidol lactate (HALDOL) injection 2 mg  2 mg Intravenous Q6H PRN Sherryll Burger, Pratik D, DO   2 mg at 11/03/20 0252  . hydrALAZINE (APRESOLINE) injection 10 mg  10  mg Intravenous Q20 Min PRN Laurey Morale, MD      . labetalol (NORMODYNE) injection 10 mg  10 mg Intravenous Q10 min PRN Laurey Morale, MD      . losartan (COZAAR) tablet 12.5 mg  12.5 mg Oral Daily Laurey Morale, MD      . magnesium sulfate IVPB 4 g 100 mL  4 g Intravenous Once Laurey Morale, MD      . multivitamin with minerals tablet 1 tablet  1 tablet Oral Daily Kathlen Mody, MD   1 tablet at 11/03/20 1206  . ondansetron (ZOFRAN) injection 4 mg  4 mg Intravenous Q6H PRN Laurey Morale, MD      . Melene Muller ON 11/11/2020] Oritavancin Diphosphate (ORBACTIV) 1,200 mg in dextrose 5 % IVPB  1,200 mg Intravenous Once Odette Fraction, MD      . pantoprazole (PROTONIX) injection 40 mg  40 mg Intravenous Q24H Raymon Mutton F, NP   40 mg at 11/03/20 2117  . polyethylene glycol (MIRALAX / GLYCOLAX) packet 17 g  17 g Oral BID Icard, Bradley L, DO      . potassium chloride SA (KLOR-CON) CR tablet 40 mEq  40 mEq Oral Once Laurey Morale, MD      . QUEtiapine (SEROQUEL) tablet 100 mg  100 mg Oral BID Icard, Bradley L, DO   100 mg at 11/03/20 2117  . sodium chloride flush (NS) 0.9 % injection 10-40 mL  10-40 mL Intracatheter Q12H Icard, Bradley L, DO   10 mL at 11/03/20 2141  . sodium chloride flush (NS) 0.9 % injection 10-40 mL  10-40 mL Intracatheter PRN Icard, Bradley L, DO      . sodium chloride flush (NS) 0.9 % injection 3 mL  3 mL Intravenous Q12H Simmons, Brittainy M, PA-C   3 mL at 11/03/20 1047  . sodium chloride flush (NS) 0.9 % injection 3 mL  3 mL Intravenous Q12H Simmons, Brittainy M, PA-C      . sodium chloride flush (NS) 0.9 % injection 3 mL  3 mL Intravenous Q12H Laurey Morale, MD      . sodium chloride flush (NS) 0.9 % injection 3 mL  3 mL Intravenous PRN Laurey Morale, MD      .  spironolactone (ALDACTONE) tablet 12.5 mg  12.5 mg Oral Daily Bensimhon, Bevelyn Buckles, MD   12.5 mg at 11/03/20 1206  . temazepam (RESTORIL) capsule 30 mg  30 mg Oral QHS Lorin Glass, MD   30 mg at 11/02/20 2317  . thiamine tablet 100 mg  100 mg Oral Daily Icard, Bradley L, DO   100 mg at 11/03/20 1206   Or  . thiamine (B-1) injection 100 mg  100 mg Intravenous Daily Icard, Bradley L, DO      . vancomycin (VANCOREADY) IVPB 1250 mg/250 mL  1,250 mg Intravenous Q12H Odette Fraction, MD 166.7 mL/hr at 11/03/20 2126 1,250 mg at 11/03/20 2126    Medications Prior to Admission  Medication Sig Dispense Refill Last Dose  . Famotidine (PEPCID PO) Take 1 tablet by mouth daily as needed (indigestion).   Past Week at Unknown time    Family History  Problem Relation Age of Onset  . Congestive Heart Failure Mother   . Congestive Heart Failure Father      Review of Systems:   Review of Systems  Constitutional: Positive for malaise/fatigue and weight loss. Negative for chills, diaphoresis and fever.  HENT: Positive for congestion and  sinus pain. Negative for ear discharge, ear pain, hearing loss, nosebleeds, sore throat and tinnitus.   Eyes: Negative for blurred vision, double vision, photophobia, pain, discharge and redness.  Respiratory: Positive for cough, sputum production and shortness of breath. Negative for hemoptysis, wheezing and stridor.   Cardiovascular: Positive for palpitations and leg swelling. Negative for chest pain, orthopnea, claudication and PND.  Gastrointestinal: Positive for heartburn. Negative for abdominal pain, blood in stool, constipation, diarrhea, melena, nausea and vomiting.  Genitourinary: Negative.        Hx of urethral "narrowing"- s/p dilation in his 20's  Musculoskeletal: Positive for joint pain, myalgias and neck pain.  Skin: Negative.   Neurological: Positive for weakness. Negative for dizziness, tingling, tremors, sensory change, speech change, focal weakness,  seizures, loss of consciousness and headaches.  Endo/Heme/Allergies: Positive for environmental allergies. Negative for polydipsia. Does not bruise/bleed easily.  Psychiatric/Behavioral: Positive for substance abuse.      Physical Exam: BP (!) 131/100 (BP Location: Left Wrist)   Pulse (!) 104   Temp 98.3 F (36.8 C) (Oral)   Resp 16   Ht 5\' 7"  (1.702 m)   Wt 77.3 kg   SpO2 98%   BMI 26.70 kg/m    Physical Exam  Constitutional: He appears acutely ill.  HENT:  Nose: No nasal discharge.  Mouth/Throat: Dental caries present. Pharynx is normal.  Eyes: Pupils are equal, round, and reactive to light.  Neck: Thyroid normal. No JVD present. No neck adenopathy. No thyromegaly present.  Cardiovascular: Regular rhythm, normal heart sounds, intact distal pulses and normal pulses. Exam reveals no gallop.  No murmur heard. No carotid bruits No varicose veins  Pulmonary/Chest: Breath sounds normal. He has no wheezes. He has no rales. He exhibits no tenderness.  Abdominal: Soft. Bowel sounds are normal. He exhibits no distension and no mass. There is no hepatomegaly. There is no abdominal tenderness.  Musculoskeletal:        General: Normal range of motion.     Cervical back: Normal range of motion and neck supple.  Neurological: He is alert and oriented to person, place, and time.  Skin: Skin is warm and dry. No rash noted. No cyanosis. No jaundice or pallor. Nails show no clubbing.    Diagnostic Studies & Laboratory data:     Recent Radiology Findings:   CARDIAC CATHETERIZATION  Result Date: 11/04/2020  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  RPDA lesion is 100% stenosed.  Ramus lesion is 100% stenosed.  Prox LAD lesion is 100% stenosed.  1. Low filling pressures. 2. Preserved cardiac output. 3. The ostial PDA, proximal LAD, and mid ramus are totally occluded with collaterals.  Will evaluate for CABG.  Arrange MRI viability study.   EEG adult  Result Date: 11/02/2020 Charlsie QuestYadav, Priyanka O,  MD     11/02/2020  6:08 PM Patient Name: Liberty HandyOfus Swett MRN: [id number] Epilepsy Attending: Charlsie QuestPriyanka O Yadav Referring Physician/Provider: Dr Brooke DareSrishti Bhagat Date: 11/02/2020 Duration: 22.31 mins Patient history: 60 year old male who had acute onset of perseveration followed by decreased verbalization with grunting this morning prior to cardiac catheterization. EEG to evaluate for seizure Level of alertness: Awake, drowsy AEDs during EEG study: Temazepam Technical aspects: This EEG study was done with scalp electrodes positioned according to the 10-20 International system of electrode placement. Electrical activity was acquired at a sampling rate of 500Hz  and reviewed with a high frequency filter of 70Hz  and a low frequency filter of 1Hz . EEG data were recorded continuously and digitally stored. Description: The  posterior dominant rhythm consists of 9 Hz activity of moderate voltage (25-35 uV) seen predominantly in posterior head regions, symmetric and reactive to eye opening and eye closing. Drowsiness was characterized by attenuation of the posterior background rhythm. Hyperventilation and photic stimulation were not performed.   IMPRESSION: This study is within normal limits. No seizures or epileptiform discharges were seen throughout the recording. Priyanka Annabelle Harman   DG HIP UNILAT WITH PELVIS 2-3 VIEWS LEFT  Result Date: 11/03/2020 CLINICAL DATA:  Recent fall EXAM: DG HIP (WITH OR WITHOUT PELVIS) 2-3V LEFT COMPARISON:  None. FINDINGS: No acute fracture or dislocation. No aggressive osseous lesion. Normal alignment. Generalized osteopenia. Soft tissue are unremarkable. No radiopaque foreign body or soft tissue emphysema. IMPRESSION: No acute osseous injury of the left hip. Given the patient's age and osteopenia, if there is persistent clinical concern for an occult hip fracture, a MRI of the hip is recommended for increased sensitivity. Electronically Signed   By: Elige Ko   On: 11/03/2020 10:01   DG HIP  UNILAT WITH PELVIS 2-3 VIEWS RIGHT  Result Date: 11/03/2020 CLINICAL DATA:  Recent fall. EXAM: DG HIP (WITH OR WITHOUT PELVIS) 2-3V RIGHT COMPARISON:  Abdomen 10/26/2020. FINDINGS: Degenerative changes lumbar spine and both hips. No acute bony or joint abnormality identified. No evidence of fracture or dislocation. IMPRESSION: Degenerative changes lumbar spine and both hips. Electronically Signed   By: Maisie Fus  Register   On: 11/03/2020 10:04     Recent Results (from the past 09811 hour(s))  ECHOCARDIOGRAM COMPLETE   Collection Time: 10/21/20 11:42 AM  Result Value   Weight 3,200   Height 69   BP 114/85   Area-P 1/2 3.15   S' Lateral 5.57   AR max vel 2.44   AV Area mean vel 2.22   AV Area VTI 2.79   Ao pk vel 0.83   AV Peak grad 2.8   AV Mean grad 1.4   Narrative      ECHOCARDIOGRAM REPORT       Patient Name:   EDER MACEK Date of Exam: 10/21/2020 Medical Rec #:  [id number]    Height:       69.0 in Accession #:    [id number]   Weight:       200.0 lb Date of Birth:  1961-01-21    BSA:          2.066 m Patient Age:    60 years     BP:           129/87 mmHg Patient Gender: M            HR:           101 bpm. [facility] Location:  [facility]  Procedure: 2D Echo  Indications:    Congestive Heart Failure I50.9   History:        Patient has no prior history of Echocardiogram examinations.                 CHF; Risk Factors:Current Smoker. Volume overload with elevated                 BNP, ETOH, Not been to doctor in 20 years per patient.   Sonographer:    Jeryl Columbia RDCS (AE) Referring Phys: [phone] Lamont Dowdy [facility]  IMPRESSIONS    1. Left ventricular ejection fraction, by estimation, is 15%. The left ventricle has severely decreased function. The left ventricle demonstrates global hypokinesis. The left ventricular internal cavity size was moderately  dilated. Left ventricular  diastolic parameters are indeterminate.  2. Right ventricular systolic function is mildly reduced. The  right ventricular size is moderately enlarged.  3. Left atrial size was severely dilated.  4. Right atrial size was mildly dilated.  5. The mitral valve is normal in structure. Mild mitral valve regurgitation. No evidence of mitral stenosis.  6. The aortic valve has an indeterminant number of cusps. There is mild calcification of the aortic valve. There is mild thickening of the aortic valve. Aortic valve regurgitation is not visualized. No aortic stenosis is present.  7. The inferior vena cava is dilated in size with <50% respiratory variability, suggesting right atrial pressure of 15 mmHg.  FINDINGS  Left Ventricle: Left ventricular ejection fraction, by estimation, is 15%. The left ventricle has severely decreased function. The left ventricle demonstrates global hypokinesis. The left ventricular internal cavity size was moderately dilated. There is  no left ventricular hypertrophy. Left ventricular diastolic parameters are indeterminate.  Right Ventricle: The right ventricular size is moderately enlarged. Right vetricular wall thickness was not assessed. Right ventricular systolic function is mildly reduced.  Left Atrium: Left atrial size was severely dilated.  Right Atrium: Right atrial size was mildly dilated.  Pericardium: There is no evidence of pericardial effusion.  Mitral Valve: The mitral valve is normal in structure. Mild mitral valve regurgitation. No evidence of mitral valve stenosis.  Tricuspid Valve: The tricuspid valve is not well visualized. Tricuspid valve regurgitation is not demonstrated.  Aortic Valve: The aortic valve has an indeterminant number of cusps. There is mild calcification of the aortic valve. There is mild thickening of the aortic valve. There is mild aortic valve annular calcification. Aortic valve regurgitation is not  visualized. No aortic stenosis is present. Aortic valve mean gradient measures 1.4 mmHg. Aortic valve peak gradient measures 2.8 mmHg.  Aortic valve area, by VTI measures 2.79 cm.  Pulmonic Valve: The pulmonic valve was not well visualized. Pulmonic valve regurgitation is not visualized. No evidence of pulmonic stenosis.  Aorta: The aortic root is normal in size and structure.  Pulmonary Artery: Indeterminant PASP, inadequate TR jet.  Venous: The inferior vena cava is dilated in size with less than 50% respiratory variability, suggesting right atrial pressure of 15 mmHg.  IAS/Shunts: No atrial level shunt detected by color flow Doppler.    LEFT VENTRICLE PLAX 2D LVIDd:         6.49 cm  Diastology LVIDs:         5.57 cm  LV e' medial:    7.80 cm/s LV PW:         0.90 cm  LV E/e' medial:  11.5 LV IVS:        0.81 cm  LV e' lateral:   5.87 cm/s LVOT diam:     2.10 cm  LV E/e' lateral: 15.3 LV SV:         35 LV SV Index:   17 LVOT Area:     3.46 cm    RIGHT VENTRICLE RV S prime:     8.67 cm/s TAPSE (M-mode): 2.0 cm  LEFT ATRIUM              Index       RIGHT ATRIUM           Index LA diam:        6.20 cm  3.00 cm/m  RA Area:     21.00 cm LA Vol (A2C):   110.0 ml 53.24 ml/m RA Volume:  58.40 ml  28.27 ml/m LA Vol (A4C):   103.0 ml 49.85 ml/m LA Biplane Vol: 112.0 ml 54.21 ml/m  AORTIC VALVE AV Area (Vmax):    2.44 cm AV Area (Vmean):   2.22 cm AV Area (VTI):     2.79 cm AV Vmax:           83.48 cm/s AV Vmean:          53.395 cm/s AV VTI:            0.124 m AV Peak Grad:      2.8 mmHg AV Mean Grad:      1.4 mmHg LVOT Vmax:         58.76 cm/s LVOT Vmean:        34.163 cm/s LVOT VTI:          0.100 m LVOT/AV VTI ratio: 0.80   AORTA Ao Root diam: 3.50 cm  MITRAL VALVE MV Area (PHT): 3.15 cm    SHUNTS MV Decel Time: 241 msec    Systemic VTI:  0.10 m MV E velocity: 89.60 cm/s  Systemic Diam: 2.10 cm MV A velocity: 36.80 cm/s MV E/A ratio:  2.43  Dina Rich MD Electronically signed by Dina Rich MD Signature Date/Time: 10/21/2020/3:25:28 PM       Final     *Note: Due to a  large number of results and/or encounters for the requested time period, some results have not been displayed. A complete set of results can be found in Results Review.   I have independently reviewed the above radiologic studies and discussed with the patient   Recent Lab Findings: Lab Results  Component Value Date   WBC 7.9 11/04/2020   HGB 12.2 (L) 11/04/2020   HCT 39.0 11/04/2020   PLT 446 (H) 11/04/2020   GLUCOSE 73 11/04/2020   CHOL 103 10/27/2020   TRIG 68 10/27/2020   HDL 24 (L) 10/27/2020   LDLCALC 65 10/27/2020   ALT 105 (H) 11/02/2020   AST 30 11/02/2020   NA 141 11/04/2020   K 2.6 (LL) 11/04/2020   CL 116 (H) 11/04/2020   CREATININE 0.48 (L) 11/04/2020   BUN 14 11/04/2020   CO2 20 (L) 11/04/2020   TSH 3.217 10/23/2020   INR 1.3 (H) 10/21/2020      Assessment / Plan: Severe multivessel coronary artery disease HFrEF/acute systolic CHF, admission EF 15%.  Initial presentation with mixed cardiogenic/septic shock. Non-STEMI NSVT Acute renal failure, required CVVH, renal function now in normal range with BUN 14 creatinine 0.48. Thrombocytosis Minor anemia History of tobacco use History of alcohol abuse Hypoalbuminemia with protein calorie malnutrition Acute respiratory failure-resolved Acute encephalopathy/delirium Polysubstance abuse MRSA bacteremia  P: Await results of MRI viability study.  Will require thoughtful risk stratification prior to proceeding with surgical revascularization.   I  spent 40 minutes counseling the patient face to face.   Rowe Clack, PA-C 11/04/2020 9:49 AM

## 2020-11-04 NOTE — Progress Notes (Signed)
TCTS consulted for CABG evaluation. °

## 2020-11-05 ENCOUNTER — Encounter (HOSPITAL_COMMUNITY): Payer: Self-pay | Admitting: Cardiology

## 2020-11-05 MED FILL — Verapamil HCl IV Soln 2.5 MG/ML: INTRAVENOUS | Qty: 2 | Status: AC

## 2020-11-09 ENCOUNTER — Telehealth (HOSPITAL_COMMUNITY): Payer: Self-pay | Admitting: Licensed Clinical Social Worker

## 2020-11-09 NOTE — Progress Notes (Signed)
Heart and Vascular Care Navigation  11/09/2020  Kyle Peck [birth year] [id number]  Reason for Referral: Pt informed by inpatient [facility] TOC CSW that pt and wife in danger of being evicted and requested we follow up with pt in the outpatient setting.   Engaged with patient by telephone for initial visit for Heart and Vascular Care Coordination.                                                                                                   Assessment:     Was able to speak with wife over the phone and get history of current situation.  Pt and wife live in home where they have been for over 30 years.  About 7 years ago the house was going to be foreclosed upon due to lack of payment but they had a family friend purchase the home and allow them to remain there and pay rent while they work to repurchase the home.  This friend died, however, within the past year and the property transferred to his daughters who do not want to be landlords and want to sell the property so they told the patient and his wife they needed to move out.    Originally the move out date was set for May 28th but they were willing to postpone while the pt was sick- they have not reset a date and no proceedings have gone to the court system so for now they can continue to remain at the property but are unsure for how long.  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  Are not sure how they will obtain income moving forward- will wait to see how the pt does after upcoming surgery to see if he will need to apply for disability or will be able to go back to work in the forseeable future.  If they are unable to obtain income and new place of their own once they are asked to leave their current home they will plan to move in with one of their 3 daughters or the wifes mother all who live locally- no concerns about being homeless if they are forced to  leave their home.        Continue to struggle with bills- was provided help by inpatient [facility] CSW with water bill to get water turned back on but now behind on electrical bill- plans to call Kyle Peck and speak with them about options- already on a payment plan no shut off notice reported- was provided with utility assistance options by inpatient TOC.  Encouraged them to reach out if they were needing further assistance.                          HRT/VAS Care Coordination    Patients Home Cardiology [facility]   Living arrangements for the past 2 months Single Family Home   Lives with: Spouse   Patient Current Insurance Coverage Self-Pay  Patient Has Concern With Paying Medical Bills Yes   Patient Concerns With Medical Bills no insurance   Medical Bill Referrals: being followed by Dionicia Abler to assist with Medicaid   Does Patient Have Prescription Coverage? No   Home Assistive Devices/Equipment None      Social History:                                                                             SDOH Screenings   Alcohol Screen: Not on file  Depression (HGD9-2): Not on file  Financial Resource Strain: High Risk  . Difficulty of Paying Living Expenses: Hard  Food Insecurity: Food Insecurity Present  . Worried About Programme researcher, broadcasting/film/video in the Last Year: Never true  . Ran Out of Food in the Last Year: Sometimes true  Housing: High Risk  . Last Housing Risk Score: 2  Physical Activity: Not on file  Social Connections: Not on file  Stress: Not on file  Tobacco Use: High Risk  . Smoking Tobacco Use: Current Some Day Smoker  . Smokeless Tobacco Use: Never Used  Transportation Needs: No Transportation Needs  . Lack of Transportation (Medical): No  . Lack of Transportation (Non-Medical): No    SDOH Interventions: Financial Resources:   none at this time  Food Insecurity:  Have been getting food stamps throughout COVID and reports no issues getting enough food in the  home  Housing Insecurity:  Pending peaceful eviction no set date at this time  Transportation:   Have their own car but report some concerns with paying for gas- have been getting help from family with this so far.    Follow-up plan:    Pt has appt next week in clinic- will plan to follow up with clinic CSW at that time regarding getting PCP and checking in regarding other concerns.  Will continue to follow and assist as needed  Burna Sis, LCSW Clinical Social Worker [facility] Desk#: [phone] Cell#: [phone]

## 2020-11-10 NOTE — Discharge Summary (Signed)
Physician Discharge Summary  [name] ZOX:[id number] DOB: 10-18-1960 DOA: 10/20/2020  PCP: Pcp, No  Admit date: 10/20/2020 Discharge date: 11/04/2020  Patient left AMA.   Brief/Interim Summary: 60 year old EtOH and cocaine user initially admitted to [facility] on 10/21/2019 for abdominal swelling and altered mental status..  Echocardiogram showed left ventricular ejection fraction of 15% with global hypokinesis.  He was treated for alcohol withdrawal with Ativan and transferred to ICU and placed on Precedex, eventually required intubation.  He subsequently became hypotensive requiring vasopressors.  He also developed AKI and hyperkalemia and he was on [facility] service.  He was extubated on 10/31/2020 and weaned off Precedex on 11/01/2020 and transferred to [facility] on 11/02/2020. Patient was scheduled for cardiac catheterization on 11/02/2020 but had to be aborted as patient became confused. Code stroke was called and his subsequent CT of the head and MRI were negative for acute stroke. EEG was done negative for any epileptiform activity.  It was thought to be secondary to ICU delirium. Pt underwent cardiac cath on 11/04/20, showed multi vessel disease, recommended CTVS consultation for CABG. He will need cardiac MRI for viability.   BUT PT LEFT AMA.   Discharge Diagnoses:  Principal Problem:   Elevated brain natriuretic peptide (BNP) level Active Problems:   CHF (congestive heart failure) (HCC)   Transaminitis   Thrombocytosis   Hypoalbuminemia due to protein-calorie malnutrition (HCC)   Tobacco use   Pressure injury of skin   Acute respiratory failure (HCC)   Cardiogenic shock (HCC)   Hyperkalemia   Acute renal failure (ARF) (HCC)   Acute encephalopathy   Polysubstance abuse (HCC)   ETOH abuse   MRSA bacteremia   Cocaine abuse (HCC)    Discharge Instructions   Allergies as of 11/04/2020   No Known Allergies     Medication List    TAKE these medications   aspirin 81 MG chewable  tablet Chew 1 tablet (81 mg total) by mouth daily.   atorvastatin 40 MG tablet Commonly known as: LIPITOR Take 1 tablet (40 mg total) by mouth daily.   digoxin 0.125 MG tablet Commonly known as: LANOXIN Take 1 tablet (0.125 mg total) by mouth daily.   furosemide 20 MG tablet Commonly known as: Lasix Take 1 tablet (20 mg total) by mouth as needed. For weight gain > 3 lb in 24 hr   linezolid 600 MG tablet Commonly known as: ZYVOX Take 1 tablet (600 mg total) by mouth 2 (two) times daily for 14 days.   losartan 25 MG tablet Commonly known as: COZAAR Take 0.5 tablets (12.5 mg total) by mouth daily.   spironolactone 25 MG tablet Commonly known as: ALDACTONE Take 0.5 tablets (12.5 mg total) by mouth daily.     ASK your doctor about these medications   PEPCID PO Take 1 tablet by mouth daily as needed (indigestion).       Follow-up Information    Branch, Dorothe Pea, MD Follow up.   Specialty: Cardiology Why: New patient appointment and hospital follow up on Monday 11/29/20 arrival time for 1:05pm Contact information: [address] [place] [address] [phone]        Laurey Morale, MD Follow up.   Specialty: Cardiology Why: June 15, 8:40 AM  [facility], [address] Code [address] Contact information: [address] [place] [address] [phone]              No Known  Allergies  Consultations:  CARDIOLOGY   Procedures/Studies: DG Chest 1 View  Result Date: 10/25/2020 CLINICAL DATA:  Respiratory failure. EXAM: CHEST  1 VIEW COMPARISON:  10/24/2020 FINDINGS: Stable cardiac enlargement. Endotracheal tube remains with the tip approximately 3 cm above the carina. Low bilateral lung volumes. There is no evidence of significant pulmonary edema, consolidation, pneumothorax or pleural fluid. IMPRESSION: Stable cardiac enlargement without overt pulmonary edema. Endotracheal tube remains.  Low bilateral lung volumes. Electronically Signed   By: Irish Lack M.D.   On: 10/25/2020 08:45   DG Chest 2 View  Result Date: 10/21/2020 CLINICAL DATA:  anasarca; fluid retention EXAM: CHEST - 2 VIEW COMPARISON:  None. FINDINGS: Cardiomegaly with mild prominence of the central vasculature. Aortic atherosclerosis. No focal consolidation or overt pulmonary edema. No pleural effusion or pneumothorax the visualized skeletal structures are unremarkable. IMPRESSION: Cardiomegaly with mild prominence of the central vasculature. No visible pleural effusion or overt pulmonary edema. Aortic Atherosclerosis (ICD10-I70.0). Electronically Signed   By: Maudry Mayhew MD   On: 10/21/2020 00:57   DG Abd 1 View  Result Date: 10/26/2020 CLINICAL DATA:  OG tube placement. EXAM: ABDOMEN - 1 VIEW COMPARISON:  None. FINDINGS: Tip and side port of the enteric tube below the diaphragm in the stomach. No bowel dilatation to suggest obstruction in the visualized abdomen. IMPRESSION: Tip and side port of the enteric tube below the diaphragm in the stomach. Electronically Signed   By: Narda Rutherford M.D.   On: 10/26/2020 16:18   MR BRAIN WO CONTRAST  Result Date: 11/02/2020 CLINICAL DATA:  Acute neurological deficit. Altered mental status. Negative acute CT evaluation. EXAM: MRI HEAD WITHOUT CONTRAST TECHNIQUE: Multiplanar, multiecho pulse sequences of the brain and surrounding structures were obtained without intravenous contrast. COMPARISON:  CT studies earlier same day FINDINGS: Brain: Diffusion imaging does not show any acute or subacute infarction. No focal abnormality affects the brainstem or cerebellum. Cerebral hemispheres show a few scattered foci of T2 and FLAIR signal within the white matter consistent with minimal small vessel change. No cortical or large vessel territory abnormality. No mass lesion, hemorrhage, hydrocephalus or extra-axial collection. Vascular: Major vessels at the base of the brain show flow.  Skull and upper cervical spine: Negative Sinuses/Orbits: Clear/normal Other: None IMPRESSION: No acute finding. Normal study except for a few punctate foci of T2 and FLAIR signal within the cerebral hemispheric white matter consistent with minimal small vessel change. Electronically Signed   By: Paulina Fusi M.D.   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  RPDA lesion is 100% stenosed.  Ramus lesion is 100% stenosed.  Prox LAD lesion is 100% stenosed.  1. Low filling pressures. 2. Preserved cardiac output. 3. The ostial PDA, proximal LAD, and mid ramus are totally occluded with collaterals.  Will evaluate for CABG.  Arrange MRI viability study.   DG Chest Port 1 View  Result Date: 10/30/2020 CLINICAL DATA:  Intubated EXAM: PORTABLE CHEST 1 VIEW COMPARISON:  10/30/2020, 10/28/2020, 10/27/2020 FINDINGS: Endotracheal tube tip is about 3.1 cm superior to carina. Esophageal tube tip below the diaphragm, side port in the GE junction region. Left IJ catheter tip over left brachiocephalic region. Cardiomegaly with aortic atherosclerosis and vascular congestion. Similar bilateral pleural effusions and basilar consolidations. Dilated bowel in the upper abdomen IMPRESSION: 1. Endotracheal tube tip about 3.1 cm superior to carina 2. Esophageal tube tip overlies the proximal to mid stomach, side-port near GE junction region,  further advancement could be considered for more optimal positioning. 3. Left IJ central venous catheter tip overlying expected location of left brachiocephalic vein 4. Stable cardiomegaly with pleural effusions and basilar consolidation Electronically Signed   By: Jasmine Pang M.D.   On: 10/30/2020 19:51   DG Chest Port 1 View  Result Date: 10/30/2020 CLINICAL DATA:  Central line placement. EXAM: PORTABLE CHEST 1 VIEW COMPARISON:  Oct 28, 2020 FINDINGS: Left internal jugular approach central catheter terminates at the expected  location of the left brachiocephalic vein. No evidence of pneumothorax. Right internal jugular approach catheter sheath is no longer visualized. Endotracheal tube in satisfactory position. Enteric catheter collimated off the image. Stable findings of mild interstitial pulmonary edema and likely bilateral layering pleural effusions. IMPRESSION: 1. Left internal jugular approach central catheter terminates at the expected location of the left brachiocephalic vein. No evidence of pneumothorax. 2. Stable findings of mild interstitial pulmonary edema and likely bilateral layering pleural effusions. Electronically Signed   By: Ted Mcalpine M.D.   On: 10/30/2020 15:21   DG CHEST PORT 1 VIEW  Result Date: 10/28/2020 CLINICAL DATA:  Congestive heart failure. EXAM: PORTABLE CHEST 1 VIEW COMPARISON:  10/27/2020 FINDINGS: Endotracheal tube is 4 cm above the carina. Left jugular central line in the upper SVC region. Evidence for a non tunneled right jugular dialysis catheter with the tip in the upper SVC region. Heart remains enlarged. Incomplete evaluation of the left lung base. There continues to be densities at both lung bases. Upper lungs are clear. Negative for pneumothorax. Atherosclerotic calcifications at the aortic arch. Nasogastric tube extends into the abdomen but the tip is beyond the image. IMPRESSION: 1. Stable appearance of the lungs with persistent bibasilar lung densities. Findings could represent a combination of consolidation and pleural effusions. 2. Stable cardiomegaly. 3. Stable appearance of the support apparatuses. Electronically Signed   By: Richarda Overlie M.D.   On: 10/28/2020 08:20   DG CHEST PORT 1 VIEW  Result Date: 10/27/2020 CLINICAL DATA:  Respiratory failure. EXAM: PORTABLE CHEST 1 VIEW COMPARISON:  10/26/2020. FINDINGS: Endotracheal tube, NG tube, right IJ line stable position. Left IJ line noted with tip over SVC. Cardiomegaly again noted. Mild bilateral interstitial prominence again  noted. Small bilateral pleural effusions. No pneumothorax. IMPRESSION: 1. Endotracheal tube, NG tube, right IJ line in stable position. Left IJ line noted with tip good position on today's exam with tip over SVC. 2. Cardiomegaly. Mild bilateral interstitial prominence again noted suggesting interstitial edema. Pneumonitis cannot be excluded. Similar findings noted on prior exam. Small bilateral pleural effusions noted. Electronically Signed   By: Maisie Fus  Register   On: 10/27/2020 05:59   DG CHEST PORT 1 VIEW  Result Date: 10/26/2020 CLINICAL DATA:  Intubation.  Respiratory failure. EXAM: PORTABLE CHEST 1 VIEW COMPARISON:  10/25/2020. FINDINGS: Left IJ line noted with its tip over the right brachiocephalic vein. Endotracheal tube, NG tube, right IJ line in stable position. Cardiomegaly again noted. Low lung volumes with bibasilar atelectasis. Diffuse mild bilateral pulmonary infiltrates/edema. Small left pleural effusion. No pneumothorax. IMPRESSION: 1. Left IJ line noted with tip over the right back swelling vein. Endotracheal tube, NG tube, right IJ line stable position. 2.  Cardiomegaly again noted. 3. Low lung volumes with bibasilar atelectasis. Diffuse mild bilateral pulmonary infiltrates/edema. Small left pleural effusion. Electronically Signed   By: Maisie Fus  Register   On: 10/26/2020 06:12   DG CHEST PORT 1 VIEW  Result Date: 10/25/2020 CLINICAL DATA:  Left IJ placement EXAM: PORTABLE CHEST  1 VIEW COMPARISON:  10/25/2020, 9:15 a.m. FINDINGS: Interval placement of left neck vascular catheter, tip projecting in the vicinity of the brachiocephalic confluence, directed slightly superiorly, possibly with azygos placement. Unchanged right neck multi lumen vascular catheter, tip projecting over the lower SVC. Otherwise unchanged examination with endotracheal tube, esophagogastric tube, cardiomegaly, and left pleural effusion and or atelectasis. IMPRESSION: 1. Interval placement of left neck vascular catheter,  tip projecting in the vicinity of the brachiocephalic confluence, directed slightly superiorly, possibly with azygos placement. 2. Otherwise unchanged examination with support apparatus as detailed above. Electronically Signed   By: Lauralyn Primes M.D.   On: 10/25/2020 19:57   DG CHEST PORT 1 VIEW  Result Date: 10/24/2020 CLINICAL DATA:  ETT and NG tube placement. EXAM: PORTABLE CHEST 1 VIEW COMPARISON:  Exam from Oct 21, 2020. FINDINGS: EKG leads project over the chest. Endotracheal tube tip between clavicular heads approximately 4.6 cm from the carina. Gastric tube coursing through in off the field of the radiograph below the LEFT hemidiaphragm. Cardiomediastinal contours remain enlarged Hilar structures are stable. Lungs are clear. On limited assessment no acute skeletal process. IMPRESSION: Support devices as described. No consolidation or effusion with cardiomegaly as before. Electronically Signed   By: Donzetta Kohut M.D.   On: 10/24/2020 17:02   DG Chest Port 1V same Day  Result Date: 10/25/2020 CLINICAL DATA:  Status post central line placement for dialysis. EXAM: PORTABLE CHEST 1 VIEW COMPARISON:  Single-view of the chest earlier today. FINDINGS: New double lumen right IJ approach dialysis catheter is in place. Tip of the catheter projects in the mid superior vena cava. ETT and NG tube are unchanged. No pneumothorax. Marked enlargement of the cardiopericardial silhouette again seen. Left basilar opacity is unchanged. IMPRESSION: New right IJ approach dialysis catheter tip projects in the mid superior vena cava. Negative for pneumothorax. No change in left basilar airspace opacity likely due to atelectasis. Marked enlargement of the cardiopericardial silhouette could be due to cardiomegaly and/or pericardial effusion. Electronically Signed   By: Drusilla Kanner M.D.   On: 10/25/2020 09:48   EEG adult  Result Date: 11/02/2020 Charlsie Quest, MD     11/02/2020  6:08 PM Patient Name: Kyle Peck  MRN: [id number] Epilepsy Attending: Charlsie Quest Referring Physician/Provider: Dr Brooke Dare Date: 11/02/2020 Duration: 22.31 mins Patient history: 60 year old male who had acute onset of perseveration followed by decreased verbalization with grunting this morning prior to cardiac catheterization. EEG to evaluate for seizure Level of alertness: Awake, drowsy AEDs during EEG study: Temazepam Technical aspects: This EEG study was done with scalp electrodes positioned according to the 10-20 International system of electrode placement. Electrical activity was acquired at a sampling rate of  and reviewed with a high frequency filter of  and a low frequency filter of . EEG data were recorded continuously and digitally stored. Description: The posterior dominant rhythm consists of 9 Hz activity of moderate voltage (25-35 uV) seen predominantly in posterior head regions, symmetric and reactive to eye opening and eye closing. Drowsiness was characterized by attenuation of the posterior background rhythm. Hyperventilation and photic stimulation were not performed.   IMPRESSION: This study is within normal limits. No seizures or epileptiform discharges were seen throughout the recording. Charlsie Quest   ECHOCARDIOGRAM COMPLETE  Result Date: 10/21/2020    ECHOCARDIOGRAM REPORT   Patient Name:   Kyle Peck Date of Exam: 10/21/2020 Medical Rec #:  [id number]    Height:       69.0 in  Accession #:    [id number]   Weight:       200.0 lb Date of Birth:  09-11-1960    BSA:          2.066 m Patient Age:    60 years     BP:           129/87 mmHg Patient Gender: M            HR:           101 bpm. [facility]am [facility] Procedure: 2D Echo Indications:    Congestive Heart Failure I50.9  History:        Patient has no prior history of Echocardiogram examinations.                 CHF; Risk Factors:Current Smoker. Volume overload with elevated                 BNP, ETOH, Not been to doctor in 20 years per patient.   Sonographer:    Jeryl Columbia RDCS (AE) Referring Phys: [phone] [facility] IMPRESSIONS  1. Left ventricular ejection fraction, by estimation, is 15%. The left ventricle has severely decreased function. The left ventricle demonstrates global hypokinesis. The left ventricular internal cavity size was moderately dilated. Left ventricular diastolic parameters are indeterminate.  2. Right ventricular systolic function is mildly reduced. The right ventricular size is moderately enlarged.  3. Left atrial size was severely dilated.  4. Right atrial size was mildly dilated.  5. The mitral valve is normal in structure. Mild mitral valve regurgitation. No evidence of mitral stenosis.  6. The aortic valve has an indeterminant number of cusps. There is mild calcification of the aortic valve. There is mild thickening of the aortic valve. Aortic valve regurgitation is not visualized. No aortic stenosis is present.  7. The inferior vena cava is dilated in size with <50% respiratory variability, suggesting right atrial pressure of 15 mmHg. FINDINGS  Left Ventricle: Left ventricular ejection fraction, by estimation, is 15%. The left ventricle has severely decreased function. The left ventricle demonstrates global hypokinesis. The left ventricular internal cavity size was moderately dilated. There is  no left ventricular hypertrophy. Left ventricular diastolic parameters are indeterminate. Right Ventricle: The right ventricular size is moderately enlarged. Right vetricular wall thickness was not assessed. Right ventricular systolic function is mildly reduced. Left Atrium: Left atrial size was severely dilated. Right Atrium: Right atrial size was mildly dilated. Pericardium: There is no evidence of pericardial effusion. Mitral Valve: The mitral valve is normal in structure. Mild mitral valve regurgitation. No evidence of mitral valve stenosis. Tricuspid Valve: The tricuspid valve is not well visualized. Tricuspid valve  regurgitation is not demonstrated. Aortic Valve: The aortic valve has an indeterminant number of cusps. There is mild calcification of the aortic valve. There is mild thickening of the aortic valve. There is mild aortic valve annular calcification. Aortic valve regurgitation is not visualized. No aortic stenosis is present. Aortic valve mean gradient measures 1.4 mmHg. Aortic valve peak gradient measures 2.8 mmHg. Aortic valve area, by VTI measures 2.79 cm. Pulmonic Valve: The pulmonic valve was not well visualized. Pulmonic valve regurgitation is not visualized. No evidence of pulmonic stenosis. Aorta: The aortic root is normal in size and structure. Pulmonary Artery: Indeterminant PASP, inadequate TR jet. Venous: The inferior vena cava is dilated in size with less than 50% respiratory variability, suggesting right atrial pressure of 15 mmHg. IAS/Shunts: No atrial level shunt detected by color flow  Doppler.  LEFT VENTRICLE PLAX 2D LVIDd:         6.49 cm  Diastology LVIDs:         5.57 cm  LV e' medial:    7.80 cm/s LV PW:         0.90 cm  LV E/e' medial:  11.5 LV IVS:        0.81 cm  LV e' lateral:   5.87 cm/s LVOT diam:     2.10 cm  LV E/e' lateral: 15.3 LV SV:         35 LV SV Index:   17 LVOT Area:     3.46 cm  RIGHT VENTRICLE RV S prime:     8.67 cm/s TAPSE (M-mode): 2.0 cm LEFT ATRIUM              Index       RIGHT ATRIUM           Index LA diam:        6.20 cm  3.00 cm/m  RA Area:     21.00 cm LA Vol (A2C):   110.0 ml 53.24 ml/m RA Volume:   58.40 ml  28.27 ml/m LA Vol (A4C):   103.0 ml 49.85 ml/m LA Biplane Vol: 112.0 ml 54.21 ml/m  AORTIC VALVE AV Area (Vmax):    2.44 cm AV Area (Vmean):   2.22 cm AV Area (VTI):     2.79 cm AV Vmax:           83.48 cm/s AV Vmean:          53.395 cm/s AV VTI:            0.124 m AV Peak Grad:      2.8 mmHg AV Mean Grad:      1.4 mmHg LVOT Vmax:         58.76 cm/s LVOT Vmean:        34.163 cm/s LVOT VTI:          0.100 m LVOT/AV VTI ratio: 0.80  AORTA Ao Root diam:  3.50 cm MITRAL VALVE MV Area (PHT): 3.15 cm    SHUNTS MV Decel Time: 241 msec    Systemic VTI:  0.10 m MV E velocity: 89.60 cm/s  Systemic Diam: 2.10 cm MV A velocity: 36.80 cm/s MV E/A ratio:  2.43 Dina Rich MD Electronically signed by Dina Rich MD Signature Date/Time: 10/21/2020/3:25:28 PM    Final    DG HIP UNILAT WITH PELVIS 2-3 VIEWS LEFT  Result Date: 11/03/2020 CLINICAL DATA:  Recent fall EXAM: DG HIP (WITH OR WITHOUT PELVIS) 2-3V LEFT COMPARISON:  None. FINDINGS: No acute fracture or dislocation. No aggressive osseous lesion. Normal alignment. Generalized osteopenia. Soft tissue are unremarkable. No radiopaque foreign body or soft tissue emphysema. IMPRESSION: No acute osseous injury of the left hip. Given the patient's age and osteopenia, if there is persistent clinical concern for an occult hip fracture, a MRI of the hip is recommended for increased sensitivity. Electronically Signed   By: Elige Ko   On: 11/03/2020 10:01   DG HIP UNILAT WITH PELVIS 2-3 VIEWS RIGHT  Result Date: 11/03/2020 CLINICAL DATA:  Recent fall. EXAM: DG HIP (WITH OR WITHOUT PELVIS) 2-3V RIGHT COMPARISON:  Abdomen 10/26/2020. FINDINGS: Degenerative changes lumbar spine and both hips. No acute bony or joint abnormality identified. No evidence of fracture or dislocation. IMPRESSION: Degenerative changes lumbar spine and both hips. Electronically Signed   By: Maisie Fus  Register   On: 11/03/2020  10:04   CT HEAD CODE STROKE WO CONTRAST`  Result Date: 11/02/2020 CLINICAL DATA:  Code stroke. 60-year-old male with altered mental status. EXAM: CT HEAD WITHOUT CONTRAST TECHNIQUE: Contiguous axial images were obtained from the base of the skull through the vertex without intravenous contrast. COMPARISON:  None. FINDINGS: Brain: Mild motion artifact. Cerebral volume is within normal limits for age. No midline shift, ventriculomegaly, mass effect, evidence of mass lesion, intracranial hemorrhage or evidence of cortically  based acute infarction. No cortical encephalomalacia identified. There is patchy white matter hypodensity along the inferior frontal horns greater on the right. Vascular: No suspicious intracranial vascular hyperdensity. Skull: No acute osseous abnormality identified. There is a 3 cm nasal septal defect. Sinuses/Orbits: Small right maxillary sinus retention cyst. Otherwise well aerated. Other: [place] soft tissue thickening at the right scalp vertex. Visualized orbit soft tissues are within normal limits. ASPECTS [facility]([place] Stroke Program Early CT Score) Total score (0-10 with 10 being normal): 10 IMPRESSION: 1. Mild for age white matter changes. No acute cortically based infarct or acute intracranial hemorrhage identified. ASPECTS 10. 2. [place] soft tissue thickening of the right scalp. Eroded nasal septum. 3. These results were communicated to Dr. Iver NestleBhagat at 8:10 am on 11/02/2020 by text page via the [facility] messaging system. Electronically Signed   By: Odessa FlemingH  Hall M.D.   On: 11/02/2020 08:11   ECHOCARDIOGRAM LIMITED  Result Date: 10/26/2020    ECHOCARDIOGRAM LIMITED REPORT   Patient Name:   Kyle Peck Date of Exam: 10/26/2020 Medical Rec #:  [id number]    Height:       67.0 in Accession #:    [id number]   Weight:       211.4 lb Date of Birth:  05/07/1961    BSA:          2.071 m Patient Age:    60 years     BP:           114/82 mmHg Patient Gender: M            HR:           100 bpm. Exam Location:  Inpatient Procedure: Limited Echo Indications:    Bacteremia  History:        Patient has prior history of Echocardiogram examinations, most                 recent 10/21/2020. CHF; Risk Factors:Current Smoker and Alcohol                 and Cocaine abuse.  Sonographer:    Thurman Coyerasey Kirkpatrick RDCS (AE) Referring Phys: [phone] [facility] IMPRESSIONS  1. Small filamentous strands noted on the ventricular surface of mitral valve leaflets (frame#[id number]), consider TEE for further evaluation.  2. Left ventricular ejection fraction,  by estimation, is 20 to 25%. The left ventricle has severely decreased function. The left ventricle demonstrates global hypokinesis.  3. Right ventricular systolic function is normal. The right ventricular size is normal.  4. The mitral valve is normal in structure. No evidence of mitral valve regurgitation. No evidence of mitral stenosis.  5. The aortic valve is normal in structure. There is mild calcification of the aortic valve. There is mild thickening of the aortic valve. Aortic valve regurgitation is not visualized. Mild to moderate aortic valve sclerosis/calcification is present, without any evidence of aortic stenosis.  6. The inferior vena cava is normal in size with greater than 50% respiratory variability, suggesting right atrial pressure of 3 mmHg. Comparison(s):  ECHO 10/21/20: EF 15%. Mildly improved. FINDINGS  Left Ventricle: Left ventricular ejection fraction, by estimation, is 20 to 25%. The left ventricle has severely decreased function. The left ventricle demonstrates global hypokinesis. The left ventricular internal cavity size was normal in size. There is no left ventricular hypertrophy. Right Ventricle: The right ventricular size is normal. No increase in right ventricular wall thickness. Right ventricular systolic function is normal. Left Atrium: Left atrial size was normal in size. Right Atrium: Right atrial size was normal in size. Pericardium: There is no evidence of pericardial effusion. Mitral Valve: The mitral valve is normal in structure. No evidence of mitral valve stenosis. Tricuspid Valve: The tricuspid valve is normal in structure. Tricuspid valve regurgitation is not demonstrated. No evidence of tricuspid stenosis. Aortic Valve: The aortic valve is normal in structure. There is mild calcification of the aortic valve. There is mild thickening of the aortic valve. Aortic valve regurgitation is not visualized. Mild to moderate aortic valve sclerosis/calcification is present, without any  evidence of aortic stenosis. Pulmonic Valve: The pulmonic valve was normal in structure. Pulmonic valve regurgitation is not visualized. No evidence of pulmonic stenosis. Aorta: The aortic root is normal in size and structure. Venous: The inferior vena cava is normal in size with greater than 50% respiratory variability, suggesting right atrial pressure of 3 mmHg. IAS/Shunts: No atrial level shunt detected by color flow Doppler. Additional Comments: Small filamentous strands noted on the ventricular surface of mitral valve leaflets (frame#21), consider TEE for further evaluation. Donato Schultz MD Electronically signed by Donato Schultz MD Signature Date/Time: 10/26/2020/11:30:55 AM    Final    [place] EKG SITE RITE  Result Date: 11/04/2020 If Site Rite image not attached, placement could not be confirmed due to current cardiac rhythm.  [place] EKG SITE RITE  Result Date: 10/25/2020 If [facility] image not attached, placement could not be confirmed due to current cardiac rhythm.  CT ANGIO HEAD NECK W WO CM W PERF (CODE STROKE)  Addendum Date: 11/02/2020   ADDENDUM REPORT: 11/02/2020 08:45 ADDENDUM: Negative CT perfusion. Electronically Signed   By: Sebastian Ache M.D.   On: 11/02/2020 08:45   Result Date: 11/02/2020 CLINICAL DATA:  Altered mental status. EXAM: CT ANGIOGRAPHY HEAD AND NECK CT PERFUSION BRAIN TECHNIQUE: Multidetector CT imaging of the head and neck was performed using the standard protocol during bolus administration of intravenous contrast. Multiplanar CT image reconstructions and MIPs were obtained to evaluate the vascular anatomy. Carotid stenosis measurements (when applicable) are obtained utilizing NASCET criteria, using the distal internal carotid diameter as the denominator. Multiphase CT imaging of the brain was performed following IV bolus contrast injection. Subsequent parametric perfusion maps were calculated using RAPID software. CONTRAST:  OMNIPAQUE IOHEXOL 350 MG/ML SOLN COMPARISON:   None. FINDINGS: CTA NECK FINDINGS Aortic arch: Standard 3 vessel aortic arch with mild atherosclerotic plaque. Widely patent arch vessel origins. Right carotid system: Patent with minimal plaque in the carotid bulb. No evidence of dissection or stenosis. Left carotid system: Patent with a small amount of calcified and soft plaque in the carotid bulb. No evidence of dissection or stenosis. Vertebral arteries: The left vertebral artery is strongly dominant and widely patent without evidence of a significant stenosis or dissection. The right vertebral artery is hypoplastic. The proximal right V1 segment is poorly visualized due to small size and shoulder artifact, and an underlying stenosis is not excluded. Skeleton: Cervical disc degeneration greatest at C6-7 where there is prominent degenerative endplate sclerosis. Advanced facet arthrosis  on the left at C3-4 and on the right at C4-5 with minimal anterolisthesis at both levels. Other neck: No evidence of cervical lymphadenopathy or mass. Mild nonspecific stranding/soft tissue swelling in the neck bilaterally. Left internal jugular venous catheter terminating in the left brachiocephalic vein (similar positioning to 10/30/2020 chest radiographs). Upper chest: No apical lung consolidation or mass. Review of the MIP images confirms the above findings CTA HEAD FINDINGS Anterior circulation: The internal carotid arteries are widely patent from skull base to carotid termini. ACAs and MCAs are patent without evidence of a proximal branch occlusion or significant proximal stenosis. No aneurysm is identified. Posterior circulation: The intracranial vertebral arteries are patent with the left being strongly dominant and the right being particularly diminutive distal to the PICA origin. Patent PICA and SCA origins are seen bilaterally. The basilar artery is widely patent. There are left larger than right posterior communicating arteries with hypoplasia of the left P1 segment.  Both PCAs are patent without evidence of a significant proximal stenosis. No aneurysm is identified. Venous sinuses: As permitted by contrast timing, patent. Anatomic variants: Fetal left PCA. Review of the MIP images confirms the above findings CT Brain Perfusion Findings: ASPECTS: 10 CBF (<30%) Volume: 0 mL Perfusion (Tmax>6.0s) volume: 0 mL IMPRESSION: 1. No emergent large vessel occlusion. 2. No significant proximal intracranial stenosis. 3. Mild cervical carotid atherosclerosis without significant stenosis. 4. Patent vertebral arteries with the left being strongly dominant. 5.  Aortic Atherosclerosis (ICD10-I70.0). These results were communicated to Dr. Iver Nestle at 8:26 am on 11/02/2020 by text page via the [facility] messaging system. Electronically Signed: By: Sebastian Ache M.D. On: 11/02/2020 08:39   US Abdomen Limited RUQ (LIVER/GB)  Result Date: 10/21/2020 CLINICAL DATA:  Transaminitis. EXAM: ULTRASOUND ABDOMEN LIMITED RIGHT UPPER QUADRANT COMPARISON:  None. FINDINGS: Gallbladder: Thickened gallbladder wall varying between 0.4 and 0.6 cm. Pericholecystic fluid. Sonographic Murphy's sign absent. No directly visualized gallstones. Common bile duct: Diameter: 0.5 cm. Equivocal debris/echogenicity within the common bile duct. Liver: Components of the liver have a nodular contour for example on image 33 series 1. No focal hepatic mass is observed. Perihepatic ascites. Patent hepatic vein tributaries. Portal vein is patent on color Doppler imaging with normal direction of blood flow towards the liver. Other: None. IMPRESSION: 1. Gallbladder wall thickening without gallstones identified. Although this could be inflammatory, it could also be caused by the patient's hypoproteinemia/hypoalbuminemia. Sonographic Murphy's sign absent. 2. Mildly nodular liver contour, cirrhosis is not excluded. 3. No biliary dilatation. Equivocal debris/echogenicity in the common bile duct. 4. Perihepatic and pericholecystic ascites.  Electronically Signed   By: Gaylyn Rong M.D.   On: 10/21/2020 10:05       Subjective:   Discharge Exam: Vitals:   11/04/20 1045 11/04/20 1100  BP: 122/89 116/89  Pulse:    Resp: (!) 24 18  Temp:  98.1 F (36.7 C)  SpO2:     Vitals:   11/04/20 0916 11/04/20 1015 11/04/20 1045 11/04/20 1100  BP: (!) 131/100 111/88 122/89 116/89  Pulse:      Resp: 16 19 (!) 24 18  Temp: 98.3 F (36.8 C)   98.1 F (36.7 C)  TempSrc: Oral   Oral  SpO2: 98%     Weight:      Height:            The results of significant diagnostics from this hospitalization (including imaging, microbiology, ancillary and laboratory) are listed below for reference.     Microbiology: No results found for  this or any previous visit (from the past 240 hour(s)).   Labs: BNP (last 3 results) Recent Labs    10/21/20 0033 10/23/20 0504  BNP 2,252.0* 1,685.0*   Basic Metabolic Panel: Recent Labs  Lab 11/04/20 0500 11/04/20 0812 11/04/20 0950  NA 141 140  139 135  K 2.6* 3.5  3.6 4.0  CL 116*  --  103  CO2 20*  --  23  GLUCOSE 73  --  116*  BUN 14  --  14  CREATININE 0.48*  --  0.73  CALCIUM 6.3*  --  8.6*  MG 1.2*  --  1.7  PHOS 3.1  --   --    Liver Function Tests: Recent Labs  Lab 11/04/20 0500  ALBUMIN 1.8*   No results for input(s): LIPASE, AMYLASE in the last 168 hours. No results for input(s): AMMONIA in the last 168 hours. CBC: Recent Labs  Lab 11/04/20 0702 11/04/20 0812  WBC 7.9  --   HGB 12.2* 12.9*  13.3  HCT 39.0 38.0*  39.0  MCV 85.9  --   PLT 446*  --    Cardiac Enzymes: No results for input(s): CKTOTAL, CKMB, CKMBINDEX, TROPONINI in the last 168 hours. BNP: Invalid input(s): POCBNP CBG: No results for input(s): GLUCAP in the last 168 hours. D-Dimer No results for input(s): DDIMER in the last 72 hours. Hgb A1c No results for input(s): HGBA1C in the last 72 hours. Lipid Profile No results for input(s): CHOL, HDL, LDLCALC, TRIG, CHOLHDL,  LDLDIRECT in the last 72 hours. Thyroid function studies No results for input(s): TSH, T4TOTAL, T3FREE, THYROIDAB in the last 72 hours.  Invalid input(s): FREET3 Anemia work up No results for input(s): VITAMINB12, FOLATE, FERRITIN, TIBC, IRON, RETICCTPCT in the last 72 hours. Urinalysis    Component Value Date/Time   COLORURINE YELLOW 10/22/2020 1745   APPEARANCEUR CLEAR 10/22/2020 1745   LABSPEC 1.014 10/22/2020 1745   PHURINE 7.0 10/22/2020 1745   GLUCOSEU NEGATIVE 10/22/2020 1745   HGBUR NEGATIVE 10/22/2020 1745   BILIRUBINUR NEGATIVE 10/22/2020 1745   KETONESUR NEGATIVE 10/22/2020 1745   PROTEINUR NEGATIVE 10/22/2020 1745   NITRITE NEGATIVE 10/22/2020 1745   LEUKOCYTESUR TRACE (A) 10/22/2020 1745   Sepsis Labs Invalid input(s): PROCALCITONIN,  WBC,  LACTICIDVEN Microbiology No results found for this or any previous visit (from the past 240 hour(s)).     SIGNED:   Kathlen Mody, MD  [facility]

## 2020-11-11 ENCOUNTER — Ambulatory Visit: Payer: Self-pay | Admitting: Cardiothoracic Surgery

## 2020-11-12 ENCOUNTER — Inpatient Hospital Stay: Payer: Self-pay | Admitting: Infectious Diseases

## 2020-11-16 ENCOUNTER — Inpatient Hospital Stay: Payer: Self-pay | Admitting: Infectious Diseases

## 2020-11-17 ENCOUNTER — Ambulatory Visit (HOSPITAL_COMMUNITY)
Admit: 2020-11-17 | Discharge: 2020-11-17 | Disposition: A | Discharge status: Home / Self Care | Payer: Self-pay | Attending: Cardiology | Admitting: Cardiology

## 2020-11-17 ENCOUNTER — Other Ambulatory Visit: Payer: Self-pay

## 2020-11-17 ENCOUNTER — Ambulatory Visit (INDEPENDENT_AMBULATORY_CARE_PROVIDER_SITE_OTHER): Payer: Self-pay | Admitting: Infectious Diseases

## 2020-11-17 ENCOUNTER — Telehealth (HOSPITAL_COMMUNITY): Payer: Self-pay | Admitting: Licensed Clinical Social Worker

## 2020-11-17 ENCOUNTER — Encounter (HOSPITAL_COMMUNITY): Payer: Self-pay | Admitting: Cardiology

## 2020-11-17 ENCOUNTER — Other Ambulatory Visit (HOSPITAL_COMMUNITY): Payer: Self-pay

## 2020-11-17 ENCOUNTER — Encounter: Payer: Self-pay | Admitting: Infectious Diseases

## 2020-11-17 ENCOUNTER — Telehealth (HOSPITAL_COMMUNITY): Payer: Self-pay | Admitting: Pharmacy Technician

## 2020-11-17 VITALS — BP 94/60 | HR 106 | Wt 178.0 lb

## 2020-11-17 VITALS — BP 110/77 | HR 98 | Temp 97.7°F | Resp 18 | Ht 69.0 in | Wt 176.8 lb

## 2020-11-17 DIAGNOSIS — Z0189 Encounter for other specified special examinations: Secondary | ICD-10-CM | POA: Insufficient documentation

## 2020-11-17 DIAGNOSIS — F1410 Cocaine abuse, uncomplicated: Secondary | ICD-10-CM | POA: Insufficient documentation

## 2020-11-17 DIAGNOSIS — Z5181 Encounter for therapeutic drug level monitoring: Secondary | ICD-10-CM

## 2020-11-17 DIAGNOSIS — I5022 Chronic systolic (congestive) heart failure: Secondary | ICD-10-CM | POA: Insufficient documentation

## 2020-11-17 DIAGNOSIS — I2510 Atherosclerotic heart disease of native coronary artery without angina pectoris: Secondary | ICD-10-CM | POA: Insufficient documentation

## 2020-11-17 DIAGNOSIS — I255 Ischemic cardiomyopathy: Secondary | ICD-10-CM | POA: Insufficient documentation

## 2020-11-17 DIAGNOSIS — A4902 Methicillin resistant Staphylococcus aureus infection, unspecified site: Secondary | ICD-10-CM | POA: Insufficient documentation

## 2020-11-17 DIAGNOSIS — R000 Tachycardia, unspecified: Secondary | ICD-10-CM | POA: Insufficient documentation

## 2020-11-17 DIAGNOSIS — I440 Atrioventricular block, first degree: Secondary | ICD-10-CM | POA: Insufficient documentation

## 2020-11-17 DIAGNOSIS — Z7982 Long term (current) use of aspirin: Secondary | ICD-10-CM | POA: Insufficient documentation

## 2020-11-17 DIAGNOSIS — R7881 Bacteremia: Secondary | ICD-10-CM

## 2020-11-17 DIAGNOSIS — Z7984 Long term (current) use of oral hypoglycemic drugs: Secondary | ICD-10-CM | POA: Insufficient documentation

## 2020-11-17 DIAGNOSIS — F1010 Alcohol abuse, uncomplicated: Secondary | ICD-10-CM | POA: Insufficient documentation

## 2020-11-17 DIAGNOSIS — Z87891 Personal history of nicotine dependence: Secondary | ICD-10-CM | POA: Insufficient documentation

## 2020-11-17 DIAGNOSIS — Z8249 Family history of ischemic heart disease and other diseases of the circulatory system: Secondary | ICD-10-CM | POA: Insufficient documentation

## 2020-11-17 DIAGNOSIS — K7460 Unspecified cirrhosis of liver: Secondary | ICD-10-CM | POA: Insufficient documentation

## 2020-11-17 DIAGNOSIS — Z79899 Other long term (current) drug therapy: Secondary | ICD-10-CM | POA: Insufficient documentation

## 2020-11-17 DIAGNOSIS — B9562 Methicillin resistant Staphylococcus aureus infection as the cause of diseases classified elsewhere: Secondary | ICD-10-CM

## 2020-11-17 LAB — BASIC METABOLIC PANEL
Anion gap: 9 (ref 5–15)
BUN: 18 mg/dL (ref 6–20)
CO2: 25 mmol/L (ref 22–32)
Calcium: 9.6 mg/dL (ref 8.9–10.3)
Chloride: 103 mmol/L (ref 98–111)
Creatinine, Ser: 0.81 mg/dL (ref 0.61–1.24)
GFR, Estimated: >60 mL/min (ref >60)
Glucose, Bld: 105 mg/dL — ABNORMAL HIGH (ref 70–99)
Potassium: 4.1 mmol/L (ref 3.5–5.1)
Sodium: 137 mmol/L (ref 135–145)

## 2020-11-17 LAB — CBC
HCT: 42.1 % (ref 38.5–50.0)
Hemoglobin: 13.3 g/dL (ref 13.2–17.1)
MCH: 27.1 pg (ref 27.0–33.0)
MCHC: 31.6 g/dL — ABNORMAL LOW (ref 32.0–36.0)
MCV: 85.7 fL (ref 80.0–100.0)
MPV: 9.2 fL (ref 7.5–12.5)
Platelets: 421 10*3/uL — ABNORMAL HIGH (ref 140–400)
RBC: 4.91 10*6/uL (ref 4.20–5.80)
RDW: 16 % — ABNORMAL HIGH (ref 11.0–15.0)
WBC: 6.1 10*3/uL (ref 3.8–10.8)

## 2020-11-17 LAB — DIGOXIN LEVEL: Digoxin Level: 0.7 ng/mL — ABNORMAL LOW (ref 0.8–2.0)

## 2020-11-17 MED ORDER — SPIRONOLACTONE 25 MG PO TABS: 25.0000 mg | ORAL_TABLET | Freq: Every day | ORAL | 3 refills | Status: DC

## 2020-11-17 MED ORDER — DAPAGLIFLOZIN PROPANEDIOL 10 MG PO TABS: 10.0000 mg | ORAL_TABLET | Freq: Every day | ORAL | 2 refills | Status: DC

## 2020-11-17 MED ORDER — TRAZODONE HCL 50 MG PO TABS: 50.0000 mg | ORAL_TABLET | Freq: Every evening | ORAL | 3 refills | Status: DC | PRN

## 2020-11-17 NOTE — Progress Notes (Signed)
Heart and Vascular Care Navigation  11/17/2020  Kyle Peck March 06, 1961 [id number]  Reason for Referral: Patient seen in clinic today to follow up on insurance and financial stress.   Engaged with patient face to face for follow up visit for Heart and Vascular Care Coordination.                                                                                                   Assessment:   Patient is a 60 yo married male who lives at home with his spouse. Patient was self employed and has been unable to work. He states he has a pending "disability"  application with [employer] but unsure as the status. Patient's wife reports she also is not working and has a pending disability application pending since December, 2021. Patient and wife report limited financial support and concerns about upcoming bills. They received some financial assistance during his recent hospitalization for a water bill but due to no income they have concerns about finances.                               HRT/VAS Care Coordination     Patients Home Cardiology Office Heart Failure Clinic   Outpatient Care Team Social Worker   Social Worker Name: Lasandra Beech, [place] [phone]   Living arrangements for the past 2 months Single Family Home   Lives with: Spouse   Patient Current Insurance Coverage Self-Pay   Patient Has Concern With Paying Medical Bills Yes   Patient Concerns With Medical Bills no insurance   Medical Bill Referrals: being followed by Dionicia Abler to assist with Medicaid   Does Patient Have Prescription Coverage? No   Home Assistive Devices/Equipment None       Social History:                                                                             SDOH Screenings   Alcohol Screen: Not on file  Depression (PHQ2-9): Not on file  Financial Resource Strain: High Risk   Difficulty of Paying Living Expenses: Hard  Food Insecurity: Food Insecurity Present   Worried About Running Out of Food in the  Last Year: Never true   Ran Out of Food in the Last Year: Sometimes true  Housing: High Risk   Last Housing Risk Score: 2  Physical Activity: Not on file  Social Connections: Not on file  Stress: Not on file  Tobacco Use: High Risk   Smoking Tobacco Use: Some Days   Smokeless Tobacco Use: Never  Transportation Needs: No Transportation Needs   Lack of Transportation (Medical): No   Lack of Transportation (Non-Medical): No    SDOH Interventions: Financial Resources:  Corporate treasurer Interventions: Artist Pending with [employer]  Food Insecurity:  Food Insecurity Interventions: Assist with [employer]  Housing Insecurity:   Not at the moment but anticipate in the near future  Transportation:    N/a    Follow-up plan:  CSW will continue to follow for insurance and financial assistance. Patient and wife will follow up with [employer] to clarify needs and documentation and let CSW if further assistance needed. Lasandra Beech, LCSW, CCSW-MCS [phone]

## 2020-11-17 NOTE — Telephone Encounter (Signed)
Patient's wife called requesting assistance with medications as they can not afford. Wife reported they were already almost home to [facility] and neglected to ask while here in the clinic. CSW explained that patient could qualify for  Heart Failure Fund assistance but would need to return to [place] to use our pharmacy as that is the only pharmacy that covers the HF Fund as payment. Wife states that she will ask a family member to assist with payment and will follow up with CSW on next visit. Wife also mentioned that transportation is now a challenge with gas prices and asked about transport options. CSW will get patient set up with [facility] for next visit. Wife grateful for the support and will contact CSW as needed. Lasandra Beech, LCSW, CCSW-MCS [phone]

## 2020-11-17 NOTE — Telephone Encounter (Signed)
Patient's wife called back to share that she reached out to [employer] and needed to sign paperwork/application. They will mail signature page and she will send back asap. She also mentioned patient has a PCP appointment on 11-29-20 @ 1pm in [place]. CSW continues to follow as needed. Lasandra Beech, LCSW, CCSW-MCS [phone]

## 2020-11-17 NOTE — Patient Instructions (Addendum)
Labs done today. We will contact you only if your labs are abnormal.  START Farxiga 10mg  (1 tablet) by mouth daily.  START Trazodone 50mg  (1 tablet) by mouth daily at bedtime as needed for sleep.  INCREASE Spironolactone to 25mg  (1 tablet) by mouth daily.   No other medication changes were made. Please continue all current medications as prescribed.  Your physician has requested that you have a cardiac MRI. Cardiac MRI uses a computer to create images of your heart as its beating, producing both still and moving pictures of your heart and major blood vessels. This has to be approved through your insurance company prior to scheduling, once approved we will contact you to schedule an appointment.   You have been referred to Cardiac thoracic surgery(TCTS). That office will contact you to schedule an appointment.   Your physician recommends that you schedule a follow-up appointment in: 10 days for a lab only appointment in [place] and in 3 weeks with our APP Clinic here in our office.   If you have any questions or concerns before your next appointment please send a message through [place] or call our office at [phone].    TO LEAVE A MESSAGE FOR THE NURSE SELECT OPTION 2, PLEASE LEAVE A MESSAGE INCLUDING: YOUR NAME DATE OF BIRTH CALL BACK NUMBER REASON FOR CALL**this is important as we prioritize the call backs  YOU WILL RECEIVE A CALL BACK THE SAME DAY AS LONG AS YOU CALL BEFORE 4:00 PM   Do the following things EVERYDAY: Weigh yourself in the morning before breakfast. Write it down and keep it in a log. Take your medicines as prescribed Eat low salt foods--Limit salt (sodium) to 2000 mg per day.  Stay as active as you can everyday Limit all fluids for the day to less than 2 liters   At the [facility], you and your health needs are our priority. As part of our continuing mission to provide you with exceptional heart care, we have created designated Provider  Care Teams. These Care Teams include your primary Cardiologist (physician) and Advanced Practice Providers (APPs- Physician Assistants and Nurse Practitioners) who all work together to provide you with the care you need, when you need it.   You may see any of the following providers on your designated Care Team at your next follow up: Dr [place] Dr [place], NP [phone], Arvilla Meres Carron Curie, PharmD   Please be sure to bring in all your medications bottles to every appointment.

## 2020-11-17 NOTE — Progress Notes (Signed)
PCP: Pcp, No Cardiology: Dr. Shirlee Latch  Mr Schneck is a 60 year old with a history of arthritis, ETOH abuse, tobacco abuse, THC use, and cocaine abuse.  Presented to [facility] on 10/21/20 with progressive shortness of breath and fatigue. CXR with pulmonary edema. BNP 2252. Echo showed severely reduced EF 15% and mildly reduced RV. IV Lasix started.  He developed lethargy with respiratory failure, suspect ETOH withdrawal. Required intubation 10/24/20. Became hypotensive requiring norepinephrine and vasopressin, he developed AKI and had CVVH transiently. He was found to have MRSA bacteremia, possibly from PNA.  TEE showed no endocarditis.  Eventually he had right and left heart cath, showing severe 3 vessel disease.  We recommended CABG but wanted MRI viability study first.  Patient decided to leave AMA.  He did get his medications prior to discharge and consented to wear a Lifevest.    He returns today for followup of CHF.  He has not drunk ETOH, smoked cigarettes, or used cocaine since discharge from the hospital.  He has completed Zyvox for MRSA.  He feels like he is doing much better symptomatically. He is not short of breath walking on flat ground.  No chest pain.  No orthopnea/PND.  No fever/chills.    ECG (personally reviewed): Sinus tachycardia 106, 1st degree AVB, old ASMI  Labs (6/22): K 4, creatinine 0.73  PMH: 1. ETOH abuse.  2. Smoking 3. Prior cocaine use.  4. MRSA bacteremia 5/22 - TEE (5/22): no endocarditis.  5. CAD: LHC (5/22) with totally occluded pLAD with collaterals, totally occluded ramus with collaterals, 70% proximal RCA, PDA totally occluded with collaterals. CABG recommended.  6. Chronic systolic CHF: Ischemic cardiomyopathy.  Echo (5/22) with EF 15%, moderate LV enlargement, moderate RV enlargement with mildly decreased RV systolic function, mild MR.  - RHC (5/22): mean RA 3, PA 26/4, mean PCWP 5, CI 2.76 Fick, CI 3.01 thermo.   Social History   Socioeconomic History   Marital  status: Married    Spouse name: Eber Jones   Number of children: Not on file   Years of education: Not on file   Highest education level: Not on file  Occupational History   Not on file  Tobacco Use   Smoking status: Former    Packs/day: 0.50    Pack years: 0.00    Types: Cigars, Cigarettes   Smokeless tobacco: Never  Vaping Use   Vaping Use: Never used  Substance and Sexual Activity   Alcohol use: Yes    Alcohol/week: 15.0 standard drinks    Types: 15 Shots of liquor per week    Comment: intermittently   Drug use: Yes    Frequency: 2.0 times per week    Types: Marijuana    Comment: "also around cocaine before"   Sexual activity: Not on file  Other Topics Concern   Not on file  Social History Narrative   Not on file   Social Determinants of Health   Financial Resource Strain: High Risk   Difficulty of Paying Living Expenses: Hard  Food Insecurity: Food Insecurity Present   Worried About Running Out of Food in the Last Year: Never true   Ran Out of Food in the Last Year: Sometimes true  Transportation Needs: Unmet Transportation Needs   Lack of Transportation (Medical): Yes   Lack of Transportation (Non-Medical): No  Physical Activity: Not on file  Stress: Not on file  Social Connections: Not on file  Intimate Partner Violence: Not on file   Family History  Problem  Relation Age of Onset   Congestive Heart Failure Mother    Congestive Heart Failure Father    ROS: All systems reviewed and negative except as per HPI.   Current Outpatient Medications  Medication Sig Dispense Refill   aspirin 81 MG chewable tablet Chew 1 tablet (81 mg total) by mouth daily. 30 tablet 5   atorvastatin (LIPITOR) 40 MG tablet Take 1 tablet (40 mg total) by mouth daily. 30 tablet 5   dapagliflozin propanediol (FARXIGA) 10 MG TABS tablet Take 1 tablet (10 mg total) by mouth daily before breakfast. 30 tablet 2   digoxin (LANOXIN) 0.125 MG tablet Take 1 tablet (0.125 mg total) by mouth daily.  30 tablet 5   Famotidine (PEPCID PO) Take 1 tablet by mouth daily as needed (indigestion).     furosemide (LASIX) 20 MG tablet Take 20 mg by mouth every other day.     losartan (COZAAR) 25 MG tablet Take 0.5 tablets (12.5 mg total) by mouth daily. 30 tablet 5   traZODone (DESYREL) 50 MG tablet Take 1 tablet (50 mg total) by mouth at bedtime as needed for sleep. 45 tablet 3   spironolactone (ALDACTONE) 25 MG tablet Take 1 tablet (25 mg total) by mouth daily. 90 tablet 3   No current facility-administered medications for this encounter.   BP 94/60   Pulse (!) 106   Wt 80.7 kg (178 lb)   SpO2 100%   BMI 27.88 kg/m  General: NAD Neck: JVP 7-8 cm, no thyromegaly or thyroid nodule.  Lungs: Clear to auscultation bilaterally with normal respiratory effort. CV: Nondisplaced PMI.  Heart regular S1/S2, no S3/S4, no murmur.  No peripheral edema.  No carotid bruit.  Normal pedal pulses.  Abdomen: Soft, nontender, no hepatosplenomegaly, no distention.  Skin: Intact without lesions or rashes.  Neurologic: Alert and oriented x 3.  Psych: Normal affect. Extremities: No clubbing or cyanosis.  HEENT: Normal.   Assessment/Plan: 1. Chronic systolic CHF: Ischemic cardiomyopathy.  Echo in 5/22 with EF 15%, moderate LV dilation, mildly decreased RV function, mild MR. Ischemic cardiomyopathy based on 6/22 cath. RHC in 5/22 showed normal cardiac index.  On exam today, he is not significantly volume overloaded.  NYHA class II symptoms.  - Continue digoxin, check level.  - Continue losartan 12.5 daily.  - Increase spironolactone to 25 mg daily.  BMET today and in 10 days.  - Add Farxiga 10 mg daily.  - Continue Lasix 20 mg every other day.  - Continue Lifevest.  Hopefully will get CABG, then will need repeat echo at 3 months post-CABG to decide on ICD.  2. ETOH abuse: He has not had anything to drink since leaving the hospital.  - I urged that he remain abstinent.  3. Cocaine abuse: This was rare and he has  not used since discharge.  4. MRSA bacteremia: ?PNA source.  TEE 5/22 showed no endocarditis. - He has completed Zyvox course.   - He sees ID today.  5 CAD: Cath 6/22 with occluded ostial PDA, mid ramus, and proximal LAD, all with collaterals.  No chest pain, presentation in 5/22 with CHF. - Continue ASA 81 and statin.  - We had wanted to get a cardiac MRI.  However, he does not have insurance and left the hospital AMA before cMRI could be done. Without insurance, I cannot get an MRI for him. I would favor proceeding with CABG given clinically stability and preserved cardiac output on RHC. He has an appointment with TCTS.  Followup 3 wks with APP.   Marca Ancona 11/17/2020

## 2020-11-17 NOTE — Progress Notes (Signed)
[facility]                                                             [address], [place], [place], [address]                                                                  Phn. [phone]; Fax: [phone]                                                                             Date: 11/17/20  Reason for Referral: HFU for MRSA bacteremia    Assessment Problem List Items Addressed This Visit       Digestive   Hepatic cirrhosis (HCC)     Other   MRSA bacteremia   Medication monitoring encounter - Primary   Relevant Orders   CBC    Complicated MRSA bacteremia(nosocomial origin) with unclear source  Multivessel CAD/ischemic cardiomyopathy Systolic CHF Follow-up with cardiology  Liver cirrhosis Polysubstance abuse  Plan Completed 10 days of vancomycin from date of negative blood cultures, followed by 1 dose of oritavancin then 14 days of linezolid.  Last dose of linezolid on 6/14 Will get surveillance blood cultures on 11/20/2018 48 hrs off antibiotics sd patient is going for CABG CBC today given patient was on linezolid Follow-up with cardiology as instructed. Follow-up with me as needed  All questions and concerns were discussed and addressed. Patient verbalized understanding of the plan. ____________________________________________________________________________________________________________________  HPI: Here for hospital follow-up for MRSA bacteremia.  Patient was recently hospitalized and left AMA on 11/04/2020.  Hospital course was remarkable for complicated MRSA bacteremia with unclear source.  TTE and TEE were negative for vegetations.  His blood cultures cleared on 5/25 2022.  Cardiac cath on 6/2 was positive for multivessel CAD with possible plan for CABG. however patient left AMA on 6/2.  He had received 10 days course of vancomycin in the hospital.  Date of  negative blood cultures on 5/25 , he was given 1 dose of oritavancin on day of discharge along with 2 weeks worth of linezolid to be taken at home.    He is accompanied by his wife today.  He says he has completed the full course of linezolid given at the discharge.  Did not miss any doses.  Last dose of linezolid was yesterday.  He is also following up with cardiology and is being set up for CABG surgery.  He is scheduled to get cardiac MRI. Denies any fever chills and sweats.  Denies any nausea vomiting diarrhea and abdominal pain.  Denies any chest pain shortness of breath and cough.  Appetite is good and weight is stable .  He states he has not used any recreational drugs since discharge from  the hospital and is motivated to become sober and take care of his health.   ROS: Constitutional: Negative for fever, chills, activity change, appetite change, fatigue and unexpected weight change.  HENT: Negative for congestion, sore throat, rhinorrhea, sneezing, trouble swallowing and sinus pressure.  Eyes: Negative for photophobia and visual disturbance.  Respiratory: Negative for cough, chest tightness, shortness of breath, wheezing and stridor.  Cardiovascular: Negative for chest pain, palpitations and leg swelling.  Gastrointestinal: Negative for nausea, vomiting, abdominal pain, diarrhea, constipation, blood in stool, abdominal distention and anal bleeding.  Genitourinary: Negative for dysuria, hematuria, flank pain and difficulty urinating.  Musculoskeletal: Negative for myalgias, back pain, joint swelling, arthralgias and gait problem.  Skin: Negative for color change, pallor, rash and wound.  Neurological: Negative for dizziness, tremors, weakness and light-headedness.  Hematological: Negative for adenopathy. Does not bruise/bleed easily.  Psychiatric/Behavioral: Negative for behavioral problems, confusion, sleep disturbance, dysphoric mood, decreased concentration and agitation.   Past Medical  History:  Diagnosis Date   Alcohol use    Arthritis    Marijuana use    Tobacco abuse    Past Surgical History:  Procedure Laterality Date   FRACTURE SURGERY     cheek    RIGHT/LEFT HEART CATH AND CORONARY ANGIOGRAPHY N/A 11/04/2020   Procedure: RIGHT/LEFT HEART CATH AND CORONARY ANGIOGRAPHY;  Surgeon: Laurey Morale, MD;  Location: [facility] INVASIVE CV LAB;  Service: Cardiovascular;  Laterality: N/A;   Current Outpatient Medications on File Prior to Visit  Medication Sig Dispense Refill   aspirin 81 MG chewable tablet Chew 1 tablet (81 mg total) by mouth daily. 30 tablet 5   atorvastatin (LIPITOR) 40 MG tablet Take 1 tablet (40 mg total) by mouth daily. 30 tablet 5   digoxin (LANOXIN) 0.125 MG tablet Take 1 tablet (0.125 mg total) by mouth daily. 30 tablet 5   Famotidine (PEPCID PO) Take 1 tablet by mouth daily as needed (indigestion).     furosemide (LASIX) 20 MG tablet Take 20 mg by mouth every other day.     losartan (COZAAR) 25 MG tablet Take 0.5 tablets (12.5 mg total) by mouth daily. 30 tablet 5   spironolactone (ALDACTONE) 25 MG tablet Take 1 tablet (25 mg total) by mouth daily. 90 tablet 3   traZODone (DESYREL) 50 MG tablet Take 1 tablet (50 mg total) by mouth at bedtime as needed for sleep. 45 tablet 3   No current facility-administered medications on file prior to visit.   No Known Allergies  Social History   Socioeconomic History   Marital status: Married    Spouse name: Eber Jones   Number of children: Not on file   Years of education: Not on file   Highest education level: Not on file  Occupational History   Not on file  Tobacco Use   Smoking status: Some Days    Packs/day: 0.50    Pack years: 0.00    Types: Cigars, Cigarettes   Smokeless tobacco: Never  Vaping Use   Vaping Use: Never used  Substance and Sexual Activity   Alcohol use: Yes    Alcohol/week: 15.0 standard drinks    Types: 15 Shots of liquor per week    Comment: intermittently   Drug use: Yes     Frequency: 2.0 times per week    Types: Marijuana    Comment: "also around cocaine before"   Sexual activity: Not on file  Other Topics Concern   Not on file  Social History Narrative   Not  on file   Social Determinants of Health   Financial Resource Strain: High Risk   Difficulty of Paying Living Expenses: Hard  Food Insecurity: Food Insecurity Present   Worried About Programme researcher, broadcasting/film/videounning Out of Food in the Last Year: Never true   Ran Out of Food in the Last Year: Sometimes true  Transportation Needs: No Transportation Needs   Lack of Transportation (Medical): No   Lack of Transportation (Non-Medical): No  Physical Activity: Not on file  Stress: Not on file  Social Connections: Not on file  Intimate Partner Violence: Not on file     Vitals BP 110/77 (BP Location: Left Arm, Patient Position: Sitting, Cuff Size: Normal)   Pulse 98   Temp 97.7 F (36.5 C) (Oral)   Resp 18   Ht 5\' 9"  (1.753 m)   Wt 176 lb 12.8 oz (80.2 kg)   SpO2 100%   BMI 26.11 kg/m    Examination  General - not in acute distress, comfortably sitting in chair HEENT - PEERLA, no pallor and no icterus Chest - b/l clear air entry, no additional sounds CVS- Normal s1s2, RRR Abdomen - Soft, Non tender , non distended Ext- no pedal edema Neuro: grossly normal Back - WNL Psych : calm and cooperative   Recent labs CBC Latest Ref Rng & Units 11/04/2020 11/04/2020 11/04/2020  WBC 4.0 - 10.5 K/uL - - 7.9  Hemoglobin 13.0 - 17.0 g/dL 12.9(L) 13.3 12.2(L)  Hematocrit 39.0 - 52.0 % 38.0(L) 39.0 39.0  Platelets 150 - 400 K/uL - - 446(H)   CMP Latest Ref Rng & Units 11/17/2020 11/04/2020 11/04/2020  Glucose 70 - 99 mg/dL 161(W105(H) 960(A116(H) -  BUN 6 - 20 mg/dL 18 14 -  Creatinine 5.400.61 - 1.24 mg/dL 9.810.81 1.910.73 -  Sodium 478135 - 145 mmol/L 137 135 140  Potassium 3.5 - 5.1 mmol/L 4.1 4.0 3.5  Chloride 98 - 111 mmol/L 103 103 -  CO2 22 - 32 mmol/L 25 23 -  Calcium 8.9 - 10.3 mg/dL 9.6 2.9(F8.6(L) -  Total Protein 6.5 - 8.1 g/dL - - -  Total  Bilirubin 0.3 - 1.2 mg/dL - - -  Alkaline Phos 38 - 126 U/L - - -  AST 15 - 41 U/L - - -  ALT 0 - 44 U/L - - -    Pertinent Microbiology Results for orders placed or performed during the hospital encounter of 10/20/20  SARS CORONAVIRUS 2 (TAT 6-24 HRS) Nasopharyngeal Nasopharyngeal Swab     Status: None   Collection Time: 10/21/20  2:00 AM   Specimen: Nasopharyngeal Swab  Result Value Ref Range Status   SARS Coronavirus 2 NEGATIVE NEGATIVE Final    Comment: (NOTE) SARS-CoV-2 target nucleic acids are NOT DETECTED.  The SARS-CoV-2 RNA is generally detectable in upper and lower respiratory specimens during the acute phase of infection. Negative results do not preclude SARS-CoV-2 infection, do not rule out co-infections with other pathogens, and should not be used as the sole basis for treatment or other patient management decisions. Negative results must be combined with clinical observations, patient history, and epidemiological information. The expected result is Negative.  Fact Sheet for Patients: HairSlick.nohttps://www.fda.gov/media/138098/download  Fact Sheet for Healthcare Providers: quierodirigir.comhttps://www.fda.gov/media/138095/download  This test is not yet approved or cleared by the [place] FDA and  has been authorized for detection and/or diagnosis of SARS-CoV-2 by FDA under an Emergency Use Authorization (EUA). This EUA will remain  in effect (meaning this test can be used) for the duration of  the COVID-19 declaration under Se ction 564(b)(1) of the Act, 21 U.S.C. section 360bbb-3(b)(1), unless the authorization is terminated or revoked sooner.  Performed at [facility], [address] [place], [place] [address]   MRSA PCR Screening     Status: Abnormal   Collection Time: 10/25/20  6:30 AM   Specimen: Nasal Mucosa; Nasopharyngeal  Result Value Ref Range Status   MRSA by PCR POSITIVE (A) NEGATIVE Final    Comment:        The GeneXpert MRSA Assay (FDA approved for NASAL  specimens only), is one component of a comprehensive MRSA colonization surveillance program. It is not intended to diagnose MRSA infection nor to guide or monitor treatment for MRSA infections. RESULT CALLED TO, READ BACK BY AND VERIFIED WITH: FOLEY,B. AT 0845 BY HUFFINES,S ON 10/25/20. Performed at [facility], [address], [place], [place] [address]   Culture, blood (routine x 2)     Status: Abnormal   Collection Time: 10/25/20  8:06 AM   Specimen: BLOOD  Result Value Ref Range Status   Specimen Description   Final    BLOOD RIGHT ANTECUBITAL Performed at [facility], [address], [place], [place] [address]    Special Requests   Final    BOTTLES DRAWN AEROBIC AND ANAEROBIC Blood Culture adequate volume Performed at [facility], [address], [place], [place] [address]    Culture  Setup Time   Final    AEROBIC BOTTLE ONLY GRAM POSITIVE COCCI Gram Stain Report Called to,Read Back By and Verified With: W COUNCIL,RN@0200  10/26/20 MKELLY CRITICAL VALUE NOTED.  VALUE IS CONSISTENT WITH PREVIOUSLY REPORTED AND CALLED VALUE.    Culture (A)  Final    STAPHYLOCOCCUS AUREUS SUSCEPTIBILITIES PERFORMED ON PREVIOUS CULTURE WITHIN THE LAST 5 DAYS. Performed at [facility], [address], [place], [place] [address]    Report Status 10/28/2020 FINAL  Final  Culture, blood (routine x 2)     Status: Abnormal   Collection Time: 10/25/20  8:06 AM   Specimen: BLOOD RIGHT WRIST  Result Value Ref Range Status   Specimen Description   Final    BLOOD RIGHT WRIST Performed at [facility], [address] [place], [place] [address]    Special Requests   Final    BOTTLES DRAWN AEROBIC AND ANAEROBIC Blood Culture adequate volume Performed at [facility], [address], [place], [place] [address]    Culture  Setup Time   Final    AEROBIC BOTTLE ONLY GRAM POSITIVE COCCI Gram Stain Report Called to,Read Back By and Verified With: W COUNCIL,RN@2204  10/25/20  MKELLY CRITICAL RESULT CALLED TO, READ BACK BY AND VERIFIED WITH: PHARMD GREG ABBOTT 10/26/2020 AT 0440 A.HUGHES ANAEROBIC BOTTLE ALSO Performed at [facility], [address] [place], [place] [address]    Culture METHICILLIN RESISTANT STAPHYLOCOCCUS AUREUS (A)  Final   Report Status 10/28/2020 FINAL  Final   Organism ID, Bacteria METHICILLIN RESISTANT STAPHYLOCOCCUS AUREUS  Final      Susceptibility   Methicillin resistant staphylococcus aureus - MIC*    CIPROFLOXACIN <=0.5 SENSITIVE Sensitive     ERYTHROMYCIN >=8 RESISTANT Resistant     GENTAMICIN <=0.5 SENSITIVE Sensitive     OXACILLIN >=4 RESISTANT Resistant     TETRACYCLINE >=16 RESISTANT Resistant     VANCOMYCIN <=0.5 SENSITIVE Sensitive     TRIMETH/SULFA <=10 SENSITIVE Sensitive     CLINDAMYCIN <=0.25 SENSITIVE Sensitive     RIFAMPIN <=0.5 SENSITIVE Sensitive  Inducible Clindamycin NEGATIVE Sensitive     * METHICILLIN RESISTANT STAPHYLOCOCCUS AUREUS  Blood Culture ID Panel (Reflexed)     Status: Abnormal   Collection Time: 10/25/20  8:06 AM  Result Value Ref Range Status   Enterococcus faecalis NOT DETECTED NOT DETECTED Final   Enterococcus Faecium NOT DETECTED NOT DETECTED Final   Listeria monocytogenes NOT DETECTED NOT DETECTED Final   Staphylococcus species DETECTED (A) NOT DETECTED Final    Comment: RBV PHARMD GREG ABBOTT 10/26/2020 AT 0441 A.HUGHES    Staphylococcus aureus (BCID) DETECTED (A) NOT DETECTED Final    Comment: Methicillin (oxacillin)-resistant Staphylococcus aureus (MRSA). MRSA is predictably resistant to beta-lactam antibiotics (except ceftaroline). Preferred therapy is vancomycin unless clinically contraindicated. Patient requires contact precautions if  hospitalized. RBV PHARMD GREG ABBOTT 10/26/2020 AT 0440 A.HUGHES    Staphylococcus epidermidis NOT DETECTED NOT DETECTED Final   Staphylococcus lugdunensis NOT DETECTED NOT DETECTED Final   Streptococcus species NOT DETECTED NOT DETECTED Final    Streptococcus agalactiae NOT DETECTED NOT DETECTED Final   Streptococcus pneumoniae NOT DETECTED NOT DETECTED Final   Streptococcus pyogenes NOT DETECTED NOT DETECTED Final   A.calcoaceticus-baumannii NOT DETECTED NOT DETECTED Final   Bacteroides fragilis NOT DETECTED NOT DETECTED Final   Enterobacterales NOT DETECTED NOT DETECTED Final   Enterobacter cloacae complex NOT DETECTED NOT DETECTED Final   Escherichia coli NOT DETECTED NOT DETECTED Final   Klebsiella aerogenes NOT DETECTED NOT DETECTED Final   Klebsiella oxytoca NOT DETECTED NOT DETECTED Final   Klebsiella pneumoniae NOT DETECTED NOT DETECTED Final   Proteus species NOT DETECTED NOT DETECTED Final   Salmonella species NOT DETECTED NOT DETECTED Final   Serratia marcescens NOT DETECTED NOT DETECTED Final   Haemophilus influenzae NOT DETECTED NOT DETECTED Final   Neisseria meningitidis NOT DETECTED NOT DETECTED Final   Pseudomonas aeruginosa NOT DETECTED NOT DETECTED Final   Stenotrophomonas maltophilia NOT DETECTED NOT DETECTED Final   Candida albicans NOT DETECTED NOT DETECTED Final   Candida auris NOT DETECTED NOT DETECTED Final   Candida glabrata NOT DETECTED NOT DETECTED Final   Candida krusei NOT DETECTED NOT DETECTED Final   Candida parapsilosis NOT DETECTED NOT DETECTED Final   Candida tropicalis NOT DETECTED NOT DETECTED Final   Cryptococcus neoformans/gattii NOT DETECTED NOT DETECTED Final   Meth resistant mecA/C and MREJ DETECTED (A) NOT DETECTED Final    Comment: PHARMD GREG ABBOTT 10/26/2020 AT 0441 A.HUGHES CRITICAL RESULT CALLED TO, READ BACK BY AND VERIFIED WITH: Performed at [facility], [address] [place], [place] [address]   Culture, blood (routine x 2)     Status: None   Collection Time: 10/27/20  6:00 AM   Specimen: BLOOD  Result Value Ref Range Status   Specimen Description BLOOD LEFT HAND  Final   Special Requests   Final    BOTTLES DRAWN AEROBIC AND ANAEROBIC Blood Culture adequate  volume   Culture   Final    NO GROWTH 5 DAYS Performed at [facility], [address] [place], [place] [address]    Report Status 11/01/2020 FINAL  Final  Culture, blood (routine x 2)     Status: None   Collection Time: 10/27/20  6:05 AM   Specimen: BLOOD  Result Value Ref Range Status   Specimen Description BLOOD RIGHT FOREARM  Final   Special Requests   Final    BOTTLES DRAWN AEROBIC AND ANAEROBIC Blood Culture adequate volume   Culture   Final    NO  GROWTH 5 DAYS Performed at [facility], [address], [place], [place] [address]    Report Status 11/01/2020 FINAL  Final      Pertinent Imaging All pertinent labs/Imagings/notes reviewed. All pertinent plain films and CT images have been personally visualized and interpreted; radiology reports have been reviewed. Decision making incorporated into the Impression / Recommendations.  I have spent 60 minutes for this patient encounter including  review of prior medical records with greater than 50% of time in face to face counsel of the patient/discussing diagnostics and plan of care.   Electronically signed by:  Odette Fraction, MD Infectious Disease Physician [facility] [place] [facility] [address] [place], [place] [address] Phone: [phone]  Fax: [phone]

## 2020-11-17 NOTE — Telephone Encounter (Signed)
Patient Advocate Encounter  Patient was seen in clinic today and started on Farxiga. The patient is currently uninsured. Started an application for [employer].   Will fax in once signatures are obtained.

## 2020-11-19 NOTE — Telephone Encounter (Addendum)
Sent in application via fax.  Will follow up.  Advanced Heart Failure Patient Advocate Encounter  Patient was approved to receive Farxiga from [employer]  Patient ID: [id number] Effective dates: 11/24/20 through 11/23/21  Patient's wife is aware.  Archer Asa, CPhT

## 2020-11-22 ENCOUNTER — Telehealth (HOSPITAL_COMMUNITY): Payer: Self-pay | Admitting: *Deleted

## 2020-11-22 LAB — CULTURE, BLOOD (SINGLE): Culture: NO GROWTH

## 2020-11-22 NOTE — Progress Notes (Signed)
Monday 11/22/2020 11:33am - CSW received a call/voicemail from the patient's wife, Mohid Furuya asking about, "putting a rush on getting her husband Medicaid as Dr. Shirlee Latch won't do the MRI without the patient having Medicaid." CSW sent an in-basket message to the outpatient social worker at the [facility] clinic about this as the inpatient social worker is unsure of how to "rush" the Medicaid process.   Agustin Swatek, MSW, LCSWA [phone] Heart Failure Social Worker

## 2020-11-22 NOTE — Telephone Encounter (Signed)
Pt called inquiring about scheduling cardiac mri. I called pt back and informed him we would need insurance on file and an authorization to schedule the mri. Pt said his medicaid is pending and will call once he gets approved.

## 2020-11-23 ENCOUNTER — Telehealth (HOSPITAL_COMMUNITY): Payer: Self-pay | Admitting: Licensed Clinical Social Worker

## 2020-11-23 NOTE — Telephone Encounter (Signed)
CSW received a call from patient and wife stating that they received a call to schedule the MRI. Patient states he was really overwhelmed as originally the MRI was unable to be scheduled due to being uninsured. Patient also requested assistance with his water bill through the Patient Care fund. CSW assisted with payment and patient very grateful for the support. CSW continues to follow as needed for financial support and insurance follow up.Lasandra Beech, LCSW, CCSW-MCS [phone]

## 2020-11-23 NOTE — Telephone Encounter (Signed)
CSW received multiple message that patient and wife need assistance. CSW returned call and message left. Lasandra Beech, LCSW, CCSW-MCS [phone]

## 2020-11-29 ENCOUNTER — Telehealth: Payer: Self-pay

## 2020-11-29 ENCOUNTER — Ambulatory Visit: Payer: Self-pay | Admitting: Cardiology

## 2020-11-29 NOTE — Telephone Encounter (Signed)
   Kyle Peck DOB: 1960-08-02 MRN: [id number]   RIDER WAIVER AND RELEASE OF LIABILITY  For purposes of improving physical access to our facilities, [facility] is pleased to partner with third parties to provide Kyle Peck patients or other authorized individuals the option of convenient, on-demand ground transportation services (the [employer]") through use of the technology service that enables users to request on-demand ground transportation from independent third-party providers.  By opting to use and accept these Kyle Peck, I, the undersigned, hereby agree on behalf of myself, and on behalf of any minor child using the Kyle Peck for whom I am the parent or legal guardian, as follows:  Kyle Peck provided to me are provided by independent third-party transportation providers who are not [employer] or employees and who are unaffiliated with [employer]. [place] [facility] is neither a transportation carrier nor a common or public carrier. [facility] has no control over the quality or safety of the transportation that occurs as a result of the Kyle Peck. [facility] cannot guarantee that any third-party transportation provider will complete any arranged transportation service. [facility] makes no representation, warranty, or guarantee regarding the reliability, timeliness, quality, safety, suitability, or availability of any of the Transport Services or that they will be error free. I fully understand that traveling by vehicle involves risks and dangers of serious bodily injury, including permanent disability, paralysis, and death. I agree, on behalf of myself and on behalf of any minor child using the Transport Services for whom I am the parent or legal guardian, that the entire risk arising out of my use of the Kyle Peck remains solely with me, to the maximum extent permitted under applicable law. The Kyle Peck are provided "as is"  and "as available." Kyle Peck disclaims all representations and warranties, express, implied or statutory, not expressly set out in these terms, including the implied warranties of merchantability and fitness for a particular purpose. I hereby waive and release Kyle Peck, its agents, employees, officers, directors, representatives, insurers, attorneys, assigns, successors, subsidiaries, and affiliates from any and all past, present, or future claims, demands, liabilities, actions, causes of action, or suits of any kind directly or indirectly arising from acceptance and use of the Kyle Peck. I further waive and release Kyle Peck and its affiliates from all present and future liability and responsibility for any injury or death to persons or damages to property caused by or related to the use of the Kyle Peck. I have read this Waiver and Release of Liability, and I understand the terms used in it and their legal significance. This Waiver is freely and voluntarily given with the understanding that my right (as well as the right of any minor child for whom I am the parent or legal guardian using the Kyle Peck) to legal recourse against Kyle Peck in connection with the Kyle Peck is knowingly surrendered in return for use of these services.   I attest that I read the consent document to Kyle Peck, gave Kyle Peck the opportunity to ask questions and answered the questions asked (if any). I affirm that Kyle Peck then provided consent for he's participation in this program.     No Pcp

## 2020-11-29 NOTE — Telephone Encounter (Signed)
   Kyle Peck DOB: Aug 15, 1960 MRN: [id number]   RIDER WAIVER AND RELEASE OF LIABILITY  For purposes of improving physical access to our facilities, [facility] is pleased to partner with third parties to provide [place] patients or other authorized individuals the option of convenient, on-demand ground transportation services (the [employer]") through use of the technology service that enables users to request on-demand ground transportation from independent third-party providers.  By opting to use and accept these [employer], I, the undersigned, hereby agree on behalf of myself, and on behalf of any minor child using the Science writer for whom I am the parent or legal guardian, as follows:  Science writer provided to me are provided by independent third-party transportation providers who are not [employer] or employees and who are unaffiliated with [employer]. Kyle Peck is neither a transportation carrier nor a common or public carrier. Kyle Peck has no control over the quality or safety of the transportation that occurs as a result of the [employer]. Kyle Peck cannot guarantee that any third-party transportation provider will complete any arranged transportation service. Kyle Peck makes no representation, warranty, or guarantee regarding the reliability, timeliness, quality, safety, suitability, or availability of any of the Transport Services or that they will be error free. I fully understand that traveling by vehicle involves risks and dangers of serious bodily injury, including permanent disability, paralysis, and death. I agree, on behalf of myself and on behalf of any minor child using the Transport Services for whom I am the parent or legal guardian, that the entire risk arising out of my use of the [employer] remains solely with me, to the maximum extent permitted under applicable law. The [employer] are provided "as is"  and "as available." [facility] disclaims all representations and warranties, express, implied or statutory, not expressly set out in these terms, including the implied warranties of merchantability and fitness for a particular purpose. I hereby waive and release Kyle Peck, its agents, employees, officers, directors, representatives, insurers, attorneys, assigns, successors, subsidiaries, and affiliates from any and all past, present, or future claims, demands, liabilities, actions, causes of action, or suits of any kind directly or indirectly arising from acceptance and use of the [employer]. I further waive and release Kyle Peck and its affiliates from all present and future liability and responsibility for any injury or death to persons or damages to property caused by or related to the use of the [employer]. I have read this Waiver and Release of Liability, and I understand the terms used in it and their legal significance. This Waiver is freely and voluntarily given with the understanding that my right (as well as the right of any minor child for whom I am the parent or legal guardian using the [employer]) to legal recourse against [place] in connection with the [employer] is knowingly surrendered in return for use of these services.   I attest that I read the consent document to Kyle Peck, gave Kyle Peck the opportunity to ask questions and answered the questions asked (if any). I affirm that Kyle Peck then provided consent for he's participation in this program.     Kyle Peck

## 2020-11-29 NOTE — Progress Notes (Deleted)
Clinical Summary Kyle Peck is a 60 y.o.male   Past Medical History:  Diagnosis Date   Alcohol use    Arthritis    Marijuana use    Tobacco abuse      No Known Allergies   Current Outpatient Medications  Medication Sig Dispense Refill   aspirin 81 MG chewable tablet Chew 1 tablet (81 mg total) by mouth daily. 30 tablet 5   atorvastatin (LIPITOR) 40 MG tablet Take 1 tablet (40 mg total) by mouth daily. 30 tablet 5   dapagliflozin propanediol (FARXIGA) 10 MG TABS tablet Take 1 tablet (10 mg total) by mouth daily before breakfast. 30 tablet 2   digoxin (LANOXIN) 0.125 MG tablet Take 1 tablet (0.125 mg total) by mouth daily. 30 tablet 5   Famotidine (PEPCID PO) Take 1 tablet by mouth daily as needed (indigestion).     furosemide (LASIX) 20 MG tablet Take 20 mg by mouth every other day.     losartan (COZAAR) 25 MG tablet Take 0.5 tablets (12.5 mg total) by mouth daily. 30 tablet 5   spironolactone (ALDACTONE) 25 MG tablet Take 1 tablet (25 mg total) by mouth daily. 90 tablet 3   traZODone (DESYREL) 50 MG tablet Take 1 tablet (50 mg total) by mouth at bedtime as needed for sleep. 45 tablet 3   No current facility-administered medications for this visit.     Past Surgical History:  Procedure Laterality Date   FRACTURE SURGERY     cheek    RIGHT/LEFT HEART CATH AND CORONARY ANGIOGRAPHY N/A 11/04/2020   Procedure: RIGHT/LEFT HEART CATH AND CORONARY ANGIOGRAPHY;  Surgeon: Laurey Morale, MD;  Location: [facility] INVASIVE CV LAB;  Service: Cardiovascular;  Laterality: N/A;     No Known Allergies    Family History  Problem Relation Age of Onset   Congestive Heart Failure Mother    Congestive Heart Failure Father      Social History Kyle Peck reports that he has quit smoking. His smoking use included cigars and cigarettes. He smoked an average of 0.50 packs per day. He has never used smokeless tobacco. Kyle Peck reports current alcohol use of about 15.0 standard drinks  of alcohol per week.   Review of Systems CONSTITUTIONAL: No weight loss, fever, chills, weakness or fatigue.  HEENT: Eyes: No visual loss, blurred vision, double vision or yellow sclerae.No hearing loss, sneezing, congestion, runny nose or sore throat.  SKIN: No rash or itching.  CARDIOVASCULAR:  RESPIRATORY: No shortness of breath, cough or sputum.  GASTROINTESTINAL: No anorexia, nausea, vomiting or diarrhea. No abdominal pain or blood.  GENITOURINARY: No burning on urination, no polyuria NEUROLOGICAL: No headache, dizziness, syncope, paralysis, ataxia, numbness or tingling in the extremities. No change in bowel or bladder control.  MUSCULOSKELETAL: No muscle, back pain, joint pain or stiffness.  LYMPHATICS: No enlarged nodes. No history of splenectomy.  PSYCHIATRIC: No history of depression or anxiety.  ENDOCRINOLOGIC: No reports of sweating, cold or heat intolerance. No polyuria or polydipsia.  Marland Kitchen   Physical Examination There were no vitals filed for this visit. There were no vitals filed for this visit.  Gen: resting comfortably, no acute distress HEENT: no scleral icterus, pupils equal round and reactive, no palptable cervical adenopathy,  CV Resp: Clear to auscultation bilaterally GI: abdomen is soft, non-tender, non-distended, normal bowel sounds, no hepatosplenomegaly MSK: extremities are warm, no edema.  Skin: warm, no rash Neuro:  no focal deficits Psych: appropriate affect   Diagnostic Studies  Assessment and Plan        Antoine Poche, M.D., F.A.C.C.

## 2020-12-02 ENCOUNTER — Telehealth (HOSPITAL_COMMUNITY): Payer: Self-pay | Admitting: Licensed Clinical Social Worker

## 2020-12-02 NOTE — Progress Notes (Signed)
Thursday 12/02/2020 11:27am - The patient's wife, Lorain Keast called the CSW to ask about a letter she received in the mail from [facility] pharmacy savings program and that she tried to call them but they won't speak with her since she is not the patient. Eber Jones reported that her husband, Vera isn't taking the trazadone as it isn't working for him and she isn't sure who enrolled them in the RX savings program. CSW informed Mrs. Jungman that she will pass the message along to the outpatient HF social worker and see how to provide assistance. CSW informed Annice Pih the outpatient social worker about the call and Annice Pih reported she will follow up with the patient and his spouse, Eber Jones.   Tayjon Halladay, MSW, LCSWA [phone] Heart Failure Social Worker

## 2020-12-02 NOTE — Telephone Encounter (Signed)
CSW received call form patient's wife asking about a letter she received from [employer]. CSW explained it is the Patient Assistance program for the Farxiga medication prescribed for patient. Patient will receive his medication via mail every 90 days. Wife misunderstood thinking that it would be all of patient's medications. CSW explained to continue all other medication through current pharmacy. Wife verbalizes understanding. CSW continues to follow for supportive needs. Lasandra Beech, LCSW, CCSW-MCS [phone]

## 2020-12-09 ENCOUNTER — Other Ambulatory Visit: Payer: Self-pay

## 2020-12-09 ENCOUNTER — Other Ambulatory Visit (HOSPITAL_COMMUNITY): Payer: Self-pay

## 2020-12-09 ENCOUNTER — Encounter (HOSPITAL_COMMUNITY): Payer: Self-pay

## 2020-12-09 ENCOUNTER — Ambulatory Visit (HOSPITAL_COMMUNITY)
Admission: RE | Admit: 2020-12-09 | Discharge: 2020-12-09 | Disposition: A | Discharge status: Home / Self Care | Payer: Self-pay | Source: Ambulatory Visit | Attending: Family Medicine | Admitting: Family Medicine

## 2020-12-09 ENCOUNTER — Other Ambulatory Visit (HOSPITAL_COMMUNITY): Payer: Self-pay | Admitting: *Deleted

## 2020-12-09 VITALS — BP 120/70 | HR 94 | Wt 191.6 lb

## 2020-12-09 DIAGNOSIS — I509 Heart failure, unspecified: Secondary | ICD-10-CM

## 2020-12-09 DIAGNOSIS — G4700 Insomnia, unspecified: Secondary | ICD-10-CM | POA: Insufficient documentation

## 2020-12-09 DIAGNOSIS — F1011 Alcohol abuse, in remission: Secondary | ICD-10-CM

## 2020-12-09 DIAGNOSIS — I255 Ischemic cardiomyopathy: Secondary | ICD-10-CM | POA: Insufficient documentation

## 2020-12-09 DIAGNOSIS — B9562 Methicillin resistant Staphylococcus aureus infection as the cause of diseases classified elsewhere: Secondary | ICD-10-CM | POA: Insufficient documentation

## 2020-12-09 DIAGNOSIS — Z7982 Long term (current) use of aspirin: Secondary | ICD-10-CM | POA: Insufficient documentation

## 2020-12-09 DIAGNOSIS — F1290 Cannabis use, unspecified, uncomplicated: Secondary | ICD-10-CM | POA: Insufficient documentation

## 2020-12-09 DIAGNOSIS — I2510 Atherosclerotic heart disease of native coronary artery without angina pectoris: Secondary | ICD-10-CM | POA: Insufficient documentation

## 2020-12-09 DIAGNOSIS — F1010 Alcohol abuse, uncomplicated: Secondary | ICD-10-CM | POA: Insufficient documentation

## 2020-12-09 DIAGNOSIS — F1411 Cocaine abuse, in remission: Secondary | ICD-10-CM

## 2020-12-09 DIAGNOSIS — Z87891 Personal history of nicotine dependence: Secondary | ICD-10-CM | POA: Insufficient documentation

## 2020-12-09 DIAGNOSIS — Z8614 Personal history of Methicillin resistant Staphylococcus aureus infection: Secondary | ICD-10-CM

## 2020-12-09 DIAGNOSIS — Z7984 Long term (current) use of oral hypoglycemic drugs: Secondary | ICD-10-CM | POA: Insufficient documentation

## 2020-12-09 DIAGNOSIS — I5022 Chronic systolic (congestive) heart failure: Secondary | ICD-10-CM | POA: Insufficient documentation

## 2020-12-09 DIAGNOSIS — Z79899 Other long term (current) drug therapy: Secondary | ICD-10-CM | POA: Insufficient documentation

## 2020-12-09 DIAGNOSIS — F1410 Cocaine abuse, uncomplicated: Secondary | ICD-10-CM | POA: Insufficient documentation

## 2020-12-09 LAB — BASIC METABOLIC PANEL
Anion gap: 4 — ABNORMAL LOW (ref 5–15)
BUN: 8 mg/dL (ref 6–20)
CO2: 28 mmol/L (ref 22–32)
Calcium: 8.7 mg/dL — ABNORMAL LOW (ref 8.9–10.3)
Chloride: 106 mmol/L (ref 98–111)
Creatinine, Ser: 0.82 mg/dL (ref 0.61–1.24)
GFR, Estimated: >60 mL/min (ref >60)
Glucose, Bld: 98 mg/dL (ref 70–99)
Potassium: 4.3 mmol/L (ref 3.5–5.1)
Sodium: 138 mmol/L (ref 135–145)

## 2020-12-09 MED ORDER — LOSARTAN POTASSIUM 25 MG PO TABS
25.0000 mg | ORAL_TABLET | Freq: Every day | ORAL | 5 refills | Status: DC
  Filled 2020-12-09: qty 30, 30d supply, fill #0

## 2020-12-09 MED ORDER — DIGOXIN 125 MCG PO TABS
0.1250 mg | ORAL_TABLET | Freq: Every day | ORAL | 5 refills | Status: DC
  Filled 2020-12-09: qty 30, 30d supply, fill #0

## 2020-12-09 MED ORDER — SPIRONOLACTONE 25 MG PO TABS
25.0000 mg | ORAL_TABLET | Freq: Every day | ORAL | 3 refills | Status: DC
  Filled 2020-12-09: qty 30, 30d supply, fill #0

## 2020-12-09 MED ORDER — DAPAGLIFLOZIN PROPANEDIOL 10 MG PO TABS
10.0000 mg | ORAL_TABLET | Freq: Every day | ORAL | 2 refills | Status: DC
  Filled 2020-12-09: qty 30, 30d supply, fill #0

## 2020-12-09 MED ORDER — FUROSEMIDE 20 MG PO TABS
20.0000 mg | ORAL_TABLET | Freq: Every day | ORAL | 3 refills | Status: DC
  Filled 2020-12-09: qty 30, 30d supply, fill #0

## 2020-12-09 MED ORDER — TRAZODONE HCL 100 MG PO TABS
100.0000 mg | ORAL_TABLET | Freq: Every evening | ORAL | 3 refills | Status: DC | PRN
  Filled 2020-12-09: qty 30, 30d supply, fill #0

## 2020-12-09 MED ORDER — ATORVASTATIN CALCIUM 40 MG PO TABS
40.0000 mg | ORAL_TABLET | Freq: Every day | ORAL | 5 refills | Status: DC
  Filled 2020-12-09: qty 30, 30d supply, fill #0

## 2020-12-09 MED ORDER — TRAZODONE HCL 100 MG PO TABS: 100.0000 mg | ORAL_TABLET | Freq: Every evening | ORAL | 3 refills | Status: AC | PRN

## 2020-12-09 NOTE — Patient Instructions (Signed)
INCREASE Trazadone to 100mg  as needed at bedtime  INCREASE Lasix to 20mg  daily  INCREASE Losartan to 25mg  daily  Routine lab work today. Will notify you of abnormal results  Repeat labs in 7-10 days  Follow up in 4 weeks.  Do the following things EVERYDAY: Weigh yourself in the morning before breakfast. Write it down and keep it in a log. Take your medicines as prescribed Eat low salt foods--Limit salt (sodium) to 2000 mg per day.  Stay as active as you can everyday Limit all fluids for the day to less than 2 liters

## 2020-12-09 NOTE — Progress Notes (Signed)
ReDS Vest / Clip - 12/09/20 1500       ReDS Vest / Clip   Station Marker D    Ruler Value 40    ReDS Value Range Low volume    ReDS Actual Value 27

## 2020-12-09 NOTE — Addendum Note (Signed)
Encounter addended by: Jacklynn Ganong, FNP on: 12/09/2020 5:15 PM  Actions taken: Clinical Note Signed

## 2020-12-09 NOTE — Progress Notes (Addendum)
PCP: Pcp, No Cardiology: Dr. Shirlee Latch  Kyle Peck is a 60 year old with a history of arthritis, ETOH abuse, tobacco abuse, THC use, and cocaine abuse.  Presented to [facility] on 10/21/20 with progressive shortness of breath and fatigue. CXR with pulmonary edema. BNP 2252. Echo showed severely reduced EF 15% and mildly reduced RV. IV Lasix started.  He developed lethargy with respiratory failure, suspect ETOH withdrawal. Required intubation 10/24/20. Became hypotensive requiring norepinephrine and vasopressin, he developed AKI and had CVVH transiently. He was found to have MRSA bacteremia, possibly from PNA.  TEE showed no endocarditis.  Eventually he had right and left heart cath, showing severe 3 vessel disease.  We recommended CABG but wanted MRI viability study first.  Patient decided to leave AMA.  He did get his medications prior to discharge and consented to wear a Lifevest.    He returned 6/22 for followup of CHF.  He has not drunk ETOH, smoked cigarettes, or used cocaine since discharge from the hospital.  He has completed Zyvox for MRSA.  He feels like he is doing much better symptomatically. He is not short of breath walking on flat ground.    Today he returns for HF follow up. Overall feeling fine. Denies increasing SOB, CP, dizziness, edema, or PND/Orthopnea. Appetite ok. No fever or chills. He does not weigh at home. Taking all medications. He has not been watching his diet, noticed his weight is up. Wearing LifeVest.  Reds: 36%  LifeVest interrogation (personally reviewed): average HR 105, no events. Appears he is only wearing for 7 hrs/day. He says his this was due to his cable/internet being cut off. Now back on.  Labs (6/22): K 4, creatinine 0.73  PMH: 1. ETOH abuse.  2. Smoking 3. Prior cocaine use.  4. MRSA bacteremia 5/22 - TEE (5/22): no endocarditis.  5. CAD: LHC (5/22) with totally occluded pLAD with collaterals, totally occluded ramus with collaterals, 70% proximal RCA, PDA  totally occluded with collaterals. CABG recommended.  6. Chronic systolic CHF: Ischemic cardiomyopathy.  Echo (5/22) with EF 15%, moderate LV enlargement, moderate RV enlargement with mildly decreased RV systolic function, mild Kyle.  - RHC (5/22): mean RA 3, PA 26/4, mean PCWP 5, CI 2.76 Fick, CI 3.01 thermo.   Social History   Socioeconomic History   Marital status: Married    Spouse name: Eber Jones   Number of children: Not on file   Years of education: Not on file   Highest education level: Not on file  Occupational History   Not on file  Tobacco Use   Smoking status: Former    Packs/day: 0.50    Pack years: 0.00    Types: Cigars, Cigarettes   Smokeless tobacco: Never  Vaping Use   Vaping Use: Never used  Substance and Sexual Activity   Alcohol use: Yes    Alcohol/week: 15.0 standard drinks    Types: 15 Shots of liquor per week    Comment: intermittently   Drug use: Yes    Frequency: 2.0 times per week    Types: Marijuana    Comment: "also around cocaine before"   Sexual activity: Not on file  Other Topics Concern   Not on file  Social History Narrative   Not on file   Social Determinants of Health   Financial Resource Strain: High Risk   Difficulty of Paying Living Expenses: Hard  Food Insecurity: Food Insecurity Present   Worried About Running Out of Food in the Last Year: Never true  Ran Out of Food in the Last Year: Sometimes true  Transportation Needs: Unmet Transportation Needs   Lack of Transportation (Medical): Yes   Lack of Transportation (Non-Medical): No  Physical Activity: Not on file  Stress: Not on file  Social Connections: Not on file  Intimate Partner Violence: Not on file   Family History  Problem Relation Age of Onset   Congestive Heart Failure Mother    Congestive Heart Failure Father    ROS: All systems reviewed and negative except as per HPI.   Current Outpatient Medications  Medication Sig Dispense Refill   aspirin 81 MG chewable  tablet Chew 1 tablet (81 mg total) by mouth daily. 30 tablet 5   atorvastatin (LIPITOR) 40 MG tablet Take 1 tablet (40 mg total) by mouth daily. 30 tablet 5   dapagliflozin propanediol (FARXIGA) 10 MG TABS tablet Take 1 tablet (10 mg total) by mouth daily before breakfast. 30 tablet 2   digoxin (LANOXIN) 0.125 MG tablet Take 1 tablet (0.125 mg total) by mouth daily. 30 tablet 5   Famotidine (PEPCID PO) Take 1 tablet by mouth daily as needed (indigestion).     furosemide (LASIX) 20 MG tablet Take 20 mg by mouth every other day.     losartan (COZAAR) 25 MG tablet Take 0.5 tablets (12.5 mg total) by mouth daily. 30 tablet 5   spironolactone (ALDACTONE) 25 MG tablet Take 1 tablet (25 mg total) by mouth daily. 90 tablet 3   traZODone (DESYREL) 50 MG tablet Take 1 tablet (50 mg total) by mouth at bedtime as needed for sleep. 45 tablet 3   No current facility-administered medications for this encounter.   Wt Readings from Last 3 Encounters:  12/09/20 86.9 kg (191 lb 9.6 oz)  11/17/20 80.7 kg (178 lb)  11/17/20 80.2 kg (176 lb 12.8 oz)   BP 120/70   Pulse 94   Wt 86.9 kg (191 lb 9.6 oz)   SpO2 99%   BMI 28.29 kg/m   General:  NAD. No resp difficulty HEENT: Normal Neck: Supple. JVP 7-8. Carotids 2+ bilat; no bruits. No lymphadenopathy or thryomegaly appreciated. Cor: PMI nondisplaced. Regular rate & rhythm. No rubs, gallops or murmurs. Lungs: Clear, LifeVest in place Abdomen: Soft, nontender, nondistended. No hepatosplenomegaly. No bruits or masses. Good bowel sounds. Extremities: No cyanosis, clubbing, rash, edema Neuro: Alert & oriented x 3, cranial nerves grossly intact. Moves all 4 extremities w/o difficulty. Affect pleasant.  Assessment/Plan: 1. Chronic systolic CHF: Ischemic cardiomyopathy.  Echo in 5/22 with EF 15%, moderate LV dilation, mildly decreased RV function, mild Kyle. Ischemic cardiomyopathy based on 6/22 cath. RHC in 5/22 showed normal cardiac index.  On exam today, he is  mildly volume overloaded on exam, however his weight is up 13 lbs from last visit.  NYHA class II symptoms.  - Change lasix to 20 mg daily. BMET today, repeat in 1-2 weeks. - Increase losartan to 25 mg daily. - Continue digoxin, check level.  - Continue spironolactone 25 mg daily.   - Continue Farxiga 10 mg daily.  - Continue Lifevest.  Hopefully will get CABG, then will need repeat echo at 3 months post-CABG to decide on ICD.  - Long discussion about importance of daily weights and watching salt + fluid intake. 2. ETOH abuse: He has not had anything to drink since leaving the hospital.  - I urged that he remain abstinent.  3. Cocaine abuse: This was rare and he has not used since discharge.  4. MRSA  bacteremia: ?PNA source.  TEE 5/22 showed no endocarditis. - He has completed Zyvox course.  Seen by ID. 5 CAD: Cath 6/22 with occluded ostial PDA, mid ramus, and proximal LAD, all with collaterals.  No chest pain, presentation in 5/22 with CHF. - Continue ASA 81 and statin.  - We had wanted to get a cardiac MRI.  However, he does not have insurance and left the hospital AMA before cMRI could be done. Without insurance, I cannot get an MRI for him. Would favor proceeding with CABG given clinically stability and preserved cardiac output on RHC. He has an appointment with [facility] soon.  6. Insomnia: Increase trazadone to 100 mg at night. - QTc 441 ms.  Followup 4 wks with APP. Hold off on beta blocker for now. Consider addition of ivabradine at next appt if HR continues to be elevated. Will start paperwork for patient assistance.  Anderson [facility] FNP 12/09/2020

## 2020-12-10 NOTE — Addendum Note (Signed)
Encounter addended by: Faythe Casa, CMA on: 12/10/2020 8:06 AM  Actions taken: Clinical Note Signed

## 2020-12-10 NOTE — Progress Notes (Signed)
ReDS value documented incorrectly, correct reading: 36%

## 2020-12-13 ENCOUNTER — Telehealth (HOSPITAL_COMMUNITY): Payer: Self-pay | Admitting: Licensed Clinical Social Worker

## 2020-12-13 ENCOUNTER — Telehealth (HOSPITAL_COMMUNITY): Payer: Self-pay | Admitting: Emergency Medicine

## 2020-12-13 NOTE — Telephone Encounter (Signed)
Calling patient to inform him that his cardiac MRI has been cancelled due to [facility] application still pending.   Pt verbalized understanding and appreciated the phone call Rockwell Alexandria RN Navigator Cardiac Imaging [facility] [phone] Office  [phone] Cell

## 2020-12-13 NOTE — Telephone Encounter (Signed)
CSW contacted patient to follow up on status of his medicaid application. Patient states he has not heard from the county yet regarding medicaid. Patient also shared that he is behind in bills and concerned that the power will be shut off. CSW offered to assist with the [employer] bill but shared with patient due to previous assistance with the Patient Care Fund that he will have exhausted this as an option. Patient verbalizes understanding and grateful for the support. CSW explained that waiting for a return call from [employer] and will follow up with patient as status updated. Lasandra Beech, LCSW, CCSW-MCS [phone]

## 2020-12-13 NOTE — Telephone Encounter (Signed)
CSW received request to follow up on uninsured status. Patient states he has not heard anything further from [employer] regarding his medicaid. CSW contacted Sharmon Revere in [employer] and left message for return call. Lasandra Beech, LCSW, CCSW-MCS [phone]

## 2020-12-15 ENCOUNTER — Telehealth (HOSPITAL_COMMUNITY): Payer: Self-pay | Admitting: Licensed Clinical Social Worker

## 2020-12-15 ENCOUNTER — Ambulatory Visit (HOSPITAL_COMMUNITY): Payer: Self-pay

## 2020-12-15 ENCOUNTER — Other Ambulatory Visit (HOSPITAL_COMMUNITY): Payer: Self-pay

## 2020-12-15 NOTE — Telephone Encounter (Signed)
CSW contacted patient to confirm that CSW spoke with [employer] who states he is medicaid is pending at [facility]. Patient should be hearing from the Co soon. Patient states he has a consult tomorrow at [facility] to discuss next steps. Patient will reach out to CSW if needed. Lasandra Beech, LCSW, CCSW-MCS [phone]

## 2020-12-15 NOTE — Telephone Encounter (Signed)
Entered duplicate note. 

## 2020-12-16 ENCOUNTER — Other Ambulatory Visit: Payer: Self-pay

## 2020-12-16 ENCOUNTER — Ambulatory Visit (INDEPENDENT_AMBULATORY_CARE_PROVIDER_SITE_OTHER): Payer: Self-pay | Admitting: Cardiothoracic Surgery

## 2020-12-16 VITALS — BP 123/74 | HR 88 | Temp 97.9°F | Resp 20 | Ht 69.0 in | Wt 191.0 lb

## 2020-12-16 DIAGNOSIS — I2510 Atherosclerotic heart disease of native coronary artery without angina pectoris: Secondary | ICD-10-CM

## 2020-12-16 NOTE — Progress Notes (Signed)
[address]       [place] [address]             [phone]     CARDIOTHORACIC SURGERY office visitation  Referring Provider is Laurey Morale, MD Primary Cardiologist is Dina Rich, MD PCP is Pcp, No  Chief Complaint  Patient presents with   Coronary Artery Disease    F/u after hospital consult 11/04/20    HPI:  60 year old man with multi substance abuse with admitted in mid May 2022 for abdominal distention and altered mental status.  His breathing work-up demonstrated severely depressed LV function.  He sustained multi organ system dysfunction and required mechanical ventilation.  He had evidence for alcohol withdrawal which was successfully treated.  Ultimately in order to assess his depressed EF he underwent left heart catheterization showing 100% occluded LAD, 100% occluded ramus intermedius and 100% occlusion at the takeoff of the posterior descending artery arising from the right coronary artery.  He also had a 70% stenosis of the more proximal RCA.  He had large ongoing obtuse marginal vessel which is nondiseased.  The patient ultimately left AMA.  He has been working with Dr. Shirlee Latch and is feeling much better with successful smoking cessation and avoidance of other illicit substances.  He is in the office today to consider elective CABG; denies chest pain or shortness of breath.  He is able to resume most of his usual activities  Past Medical History:  Diagnosis Date   Alcohol use    Arthritis    Marijuana use    Tobacco abuse     Past Surgical History:  Procedure Laterality Date   FRACTURE SURGERY     cheek    RIGHT/LEFT HEART CATH AND CORONARY ANGIOGRAPHY N/A 11/04/2020   Procedure: RIGHT/LEFT HEART CATH AND CORONARY ANGIOGRAPHY;  Surgeon: Laurey Morale, MD;  Location: [facility] INVASIVE CV LAB;  Service: Cardiovascular;  Laterality: N/A;    Family History  Problem Relation Age of Onset   Congestive Heart Failure Mother    Congestive  Heart Failure Father     Social History   Socioeconomic History   Marital status: Married    Spouse name: Eber Jones   Number of children: Not on file   Years of education: Not on file   Highest education level: Not on file  Occupational History   Not on file  Tobacco Use   Smoking status: Former    Packs/day: 0.50    Types: Cigars, Cigarettes   Smokeless tobacco: Never  Vaping Use   Vaping Use: Never used  Substance and Sexual Activity   Alcohol use: Yes    Alcohol/week: 15.0 standard drinks    Types: 15 Shots of liquor per week    Comment: intermittently   Drug use: Yes    Frequency: 2.0 times per week    Types: Marijuana    Comment: "also around cocaine before"   Sexual activity: Not on file  Other Topics Concern   Not on file  Social History Narrative   Not on file   Social Determinants of Health   Financial Resource Strain: High Risk   Difficulty of Paying Living Expenses: Hard  Food Insecurity: Food Insecurity Present   Worried About Running Out of Food in the Last Year: Never true   Ran Out of Food in the Last Year: Sometimes true  Transportation Needs: Unmet Transportation Needs   Lack of Transportation (Medical): Yes   Lack of Transportation (  Non-Medical): No  Physical Activity: Not on file  Stress: Not on file  Social Connections: Not on file  Intimate Partner Violence: Not on file    Current Outpatient Medications  Medication Sig Dispense Refill   aspirin 81 MG chewable tablet Chew 1 tablet (81 mg total) by mouth daily. 30 tablet 5   atorvastatin (LIPITOR) 40 MG tablet Take 1 tablet (40 mg total) by mouth daily. 30 tablet 5   dapagliflozin propanediol (FARXIGA) 10 MG TABS tablet Take 1 tablet (10 mg total) by mouth daily before breakfast. 30 tablet 2   digoxin (LANOXIN) 0.125 MG tablet Take 1 tablet (0.125 mg total) by mouth daily. 30 tablet 5   Famotidine (PEPCID PO) Take 1 tablet by mouth daily as needed (indigestion).     furosemide (LASIX) 20 MG  tablet Take 1 tablet (20 mg total) by mouth daily. 30 tablet 3   losartan (COZAAR) 25 MG tablet Take 1 tablet (25 mg total) by mouth daily. 30 tablet 5   spironolactone (ALDACTONE) 25 MG tablet Take 1 tablet (25 mg total) by mouth daily. 90 tablet 3   traZODone (DESYREL) 100 MG tablet Take 1 tablet (100 mg total) by mouth at bedtime as needed for sleep. 30 tablet 3   No current facility-administered medications for this visit.    No Known Allergies    Review of Systems:   General:  Improved energy level  Cardiac:  As per HPI  Respiratory:  History of mechanical ventilation  GI:   History of cirrhosis  GU:   History of acute renal failure now resolved  Vascular:  Negative for claudication or varicose veins neuro:   History of seizure disorder  Musculoskeletal: History of left knee injury  Skin:   Negative  Psych:   As per HPI  Eyes:   Negative  ENT:   Negative  Hematologic:  Negative  Endocrine:  Negative     Physical Exam:   BP 123/74   Pulse 88   Temp 97.9 F (36.6 C) (Skin)   Resp 20   Ht 5\' 9"  (1.753 m)   Wt 86.6 kg   SpO2 97% Comment: RA  BMI 28.21 kg/m   General:    well-appearing, no distress  HEENT:  Unremarkable   Neck:   no JVD, no bruits, no adenopathy   Chest:   clear to auscultation, symmetrical breath sounds, no wheezes, no rhonchi   CV:   RRR, no detectable murmur   Abdomen:  soft, non-tender, no masses   Extremities:  warm, well-perfused, pulses intact throughout, no LE edema  Rectal/GU  Deferred  Neuro:   Grossly non-focal and symmetrical throughout  Skin:   Clean and dry, no rashes, no breakdown   Diagnostic Tests:  I have personally reviewed his left heart catheterization and echocardiogram both from a couple months ago demonstrating global ejection fraction and multivessel coronary artery disease.   Impression:  60 year old man with multivessel coronary artery disease and recent presentation for heart failure.  He is stable at home now  with medical management   Plan:  Will discuss management with Dr. 67 I think it is reasonable to consider right coronary artery stenting It is also reasonable to consider cardiac MRI before undergoing CABG to assess myocardial viability.   I spent in excess of 20 minutes during the conduct of this office consultation and >50% of this time involved direct face-to-face encounter with the patient for counseling and/or coordination of their care.  Level 3 Office Consult = 40 minutes         Level 4 Office Consult = 60 minutes         Level 5 Office Consult = 80 minutes  B.  Lorayne Marek, MD 12/16/2020 4:19 PM

## 2020-12-21 ENCOUNTER — Other Ambulatory Visit (HOSPITAL_COMMUNITY): Payer: Self-pay

## 2020-12-21 ENCOUNTER — Telehealth (HOSPITAL_COMMUNITY): Payer: Self-pay | Admitting: Pharmacy Technician

## 2020-12-21 ENCOUNTER — Other Ambulatory Visit: Payer: Self-pay

## 2020-12-21 NOTE — Telephone Encounter (Signed)
Advanced Heart Failure Patient Advocate Encounter  Sent in [place] application via fax.  Will follow up.

## 2021-01-06 NOTE — Progress Notes (Signed)
PCP: Pcp, No Cardiology: Dr. Shirlee Latch  Mr Mccamish is a 60 year old with a history of arthritis, ETOH abuse, tobacco abuse, THC use, and cocaine abuse.  Presented to [facility] on 10/21/20 with progressive shortness of breath and fatigue. CXR with pulmonary edema. BNP 2252. Echo showed severely reduced EF 15% and mildly reduced RV. IV Lasix started.  He developed lethargy with respiratory failure, suspect ETOH withdrawal. Required intubation 10/24/20. Became hypotensive requiring norepinephrine and vasopressin, he developed AKI and had CVVH transiently. He was found to have MRSA bacteremia, possibly from PNA.  TEE showed no endocarditis.  Eventually he had right and left heart cath, showing severe 3 vessel disease.  We recommended CABG but wanted MRI viability study first.  Patient decided to leave AMA.  He did get his medications prior to discharge and consented to wear a Lifevest.    Today he returns for HF follow up. Overall feeling great. 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 Denies increasing SOB, CP, dizziness, edema, or PND/Orthopnea. Appetite ok. No fever or chills. Weight at home 181 pounds. Taking all medications. He has not been wearing his LifeVest because he is back at work outside sweating. Says he was told vest may get false reading if he is riding lawnmower and could potentially shock him.  ECG (personally reviewed): SR qrs 98 ms  LifeVest interrogation (personally reviewed): Last data from 12/20/20. Appeared he had some NSVT (run of 24 beats) detected but not treated on 12/10/20.  Labs (6/22): K 4, creatinine 0.73 Labs (7/22): K 4.3, creatinine 0.82  PMH: 1. ETOH abuse.  2. Smoking 3. Prior cocaine use.  4. MRSA bacteremia 5/22 - TEE (5/22): no endocarditis.  5. CAD: LHC (5/22) with totally occluded pLAD with collaterals, totally occluded ramus with collaterals, 70% proximal RCA, PDA totally occluded with collaterals. CABG recommended.  6. Chronic systolic CHF: Ischemic  cardiomyopathy.  Echo (5/22) with EF 15%, moderate LV enlargement, moderate RV enlargement with mildly decreased RV systolic function, mild MR.  - RHC (5/22): mean RA 3, PA 26/4, mean PCWP 5, CI 2.76 Fick, CI 3.01 thermo.   Social History   Socioeconomic History   Marital status: Married    Spouse name: Eber Jones   Number of children: Not on file   Years of education: Not on file   Highest education level: Not on file  Occupational History   Not on file  Tobacco Use   Smoking status: Former    Packs/day: 0.50    Types: Cigars, Cigarettes   Smokeless tobacco: Never  Vaping Use   Vaping Use: Never used  Substance and Sexual Activity   Alcohol use: Yes    Alcohol/week: 15.0 standard drinks    Types: 15 Shots of liquor per week    Comment: intermittently   Drug use: Yes    Frequency: 2.0 times per week    Types: Marijuana    Comment: "also around cocaine before"   Sexual activity: Not on file  Other Topics Concern   Not on file  Social History Narrative   Not on file   Social Determinants of Health   Financial Resource Strain: High Risk   Difficulty of Paying Living Expenses: Hard  Food Insecurity: Food Insecurity Present   Worried About Running Out of Food in the Last Year: Never true   Ran Out of Food in the Last Year: Sometimes true  Transportation Needs: Unmet Transportation Needs   Lack of Transportation (Medical): Yes   Lack of  Transportation (Non-Medical): No  Physical Activity: Not on file  Stress: Not on file  Social Connections: Not on file  Intimate Partner Violence: Not on file   Family History  Problem Relation Age of Onset   Congestive Heart Failure Mother    Congestive Heart Failure Father    ROS: All systems reviewed and negative except as per HPI.   Current Outpatient Medications  Medication Sig Dispense Refill   aspirin 81 MG chewable tablet Chew 1 tablet (81 mg total) by mouth daily. 30 tablet 5   atorvastatin (LIPITOR) 40 MG tablet Take 1  tablet (40 mg total) by mouth daily. 30 tablet 5   dapagliflozin propanediol (FARXIGA) 10 MG TABS tablet Take 1 tablet (10 mg total) by mouth daily before breakfast. 30 tablet 2   digoxin (LANOXIN) 0.125 MG tablet Take 1 tablet (0.125 mg total) by mouth daily. 30 tablet 5   Famotidine (PEPCID PO) Take 1 tablet by mouth daily as needed (indigestion).     furosemide (LASIX) 20 MG tablet Take 1 tablet (20 mg total) by mouth daily. 30 tablet 3   losartan (COZAAR) 25 MG tablet Take 1 tablet (25 mg total) by mouth daily. 30 tablet 5   spironolactone (ALDACTONE) 25 MG tablet Take 1 tablet (25 mg total) by mouth daily. 90 tablet 3   traZODone (DESYREL) 100 MG tablet Take 1 tablet (100 mg total) by mouth at bedtime as needed for sleep. 30 tablet 3   No current facility-administered medications for this encounter.   Wt Readings from Last 3 Encounters:  01/07/21 83.2 kg (183 lb 6.4 oz)  12/16/20 86.6 kg (191 lb)  12/09/20 86.9 kg (191 lb 9.6 oz)   BP 110/60   Pulse 88   Wt 83.2 kg (183 lb 6.4 oz)   SpO2 98%   BMI 27.08 kg/m   General:  NAD. No resp difficulty HEENT: Normal Neck: Supple. No JVD. Carotids 2+ bilat; no bruits. No lymphadenopathy or thryomegaly appreciated. Cor: PMI nondisplaced. Regular rate & rhythm. No rubs, gallops or murmurs. Lungs: Clear Abdomen: Soft, nontender, nondistended. No hepatosplenomegaly. No bruits or masses. Good bowel sounds. Extremities: No cyanosis, clubbing, rash, edema Neuro: Alert & oriented x 3, cranial nerves grossly intact. Moves all 4 extremities w/o difficulty. Affect pleasant.  Assessment/Plan: 1. Chronic systolic CHF: Ischemic cardiomyopathy.  Echo in 5/22 with EF 15%, moderate LV dilation, mildly decreased RV function, mild MR. Ischemic cardiomyopathy based on 6/22 cath. RHC in 5/22 showed normal cardiac index.  On exam today, he is not volume overloaded, weight is down. Improving NYHA class II symptoms.  - Stop losartan. - Start Entresto 24/26 mg  bid. BMET today, repeat in 10 days. - Change lasix to 20 mg prn weight gain. - Continue digoxin, check level.  - Continue spironolactone 25 mg daily.   - Continue Farxiga 10 mg daily.  - Strongly encouraged compliance w/ Lifevest.  Will reach out to LifeVest rep for further patient re-education. Hopefully will get CABG, then will need repeat echo at 3 months post-CABG to decide on ICD.  - Long discussion about importance of daily weights and watching salt + fluid intake. 2. ETOH abuse: He has not had anything to drink since leaving the hospital.  - I urged that he remain abstinent.  3. Cocaine abuse: This was rare and he has not used since discharge.  4. MRSA bacteremia: ?PNA source.  TEE 5/22 showed no endocarditis. - He has completed Zyvox course.  Seen by ID. 5  CAD: Cath 6/22 with occluded ostial PDA, mid ramus, and proximal LAD, all with collaterals.  No chest pain, presentation in 5/22 with CHF. - Continue ASA 81 and statin.  - We had wanted to get a cardiac MRI.  However, he does not have insurance and left the hospital AMA before cMRI could be done. Without insurance, cannot get an MRI. Would favor proceeding with CABG given clinically stable and preserved cardiac output on RHC. - Personally discussed with Dr. Shirlee Latch today, will refer to Dr. Laneta Simmers w/ TCTS for CABG consideration. 6. Insomnia: Continue trazadone 100 mg at night. - QTc 420 ms.  Followup 3-4 week w/ PharmD. Consider addition of low dose beta blocker; 6-8 weeks with APP.  Anderson Malta [facility] FNP 01/07/2021

## 2021-01-07 ENCOUNTER — Encounter (HOSPITAL_COMMUNITY): Payer: Self-pay

## 2021-01-07 ENCOUNTER — Ambulatory Visit (HOSPITAL_COMMUNITY)
Admission: RE | Admit: 2021-01-07 | Discharge: 2021-01-07 | Disposition: A | Discharge status: Home / Self Care | Payer: Self-pay | Source: Ambulatory Visit | Attending: Family Medicine | Admitting: Family Medicine

## 2021-01-07 ENCOUNTER — Other Ambulatory Visit: Payer: Self-pay

## 2021-01-07 ENCOUNTER — Other Ambulatory Visit (HOSPITAL_COMMUNITY): Payer: Self-pay

## 2021-01-07 VITALS — BP 110/60 | HR 88 | Wt 183.4 lb

## 2021-01-07 DIAGNOSIS — I255 Ischemic cardiomyopathy: Secondary | ICD-10-CM | POA: Insufficient documentation

## 2021-01-07 DIAGNOSIS — I340 Nonrheumatic mitral (valve) insufficiency: Secondary | ICD-10-CM | POA: Insufficient documentation

## 2021-01-07 DIAGNOSIS — Z596 Low income: Secondary | ICD-10-CM | POA: Insufficient documentation

## 2021-01-07 DIAGNOSIS — Z7984 Long term (current) use of oral hypoglycemic drugs: Secondary | ICD-10-CM | POA: Insufficient documentation

## 2021-01-07 DIAGNOSIS — F1410 Cocaine abuse, uncomplicated: Secondary | ICD-10-CM | POA: Insufficient documentation

## 2021-01-07 DIAGNOSIS — G4700 Insomnia, unspecified: Secondary | ICD-10-CM

## 2021-01-07 DIAGNOSIS — Z7982 Long term (current) use of aspirin: Secondary | ICD-10-CM | POA: Insufficient documentation

## 2021-01-07 DIAGNOSIS — F1011 Alcohol abuse, in remission: Secondary | ICD-10-CM

## 2021-01-07 DIAGNOSIS — Z8249 Family history of ischemic heart disease and other diseases of the circulatory system: Secondary | ICD-10-CM | POA: Insufficient documentation

## 2021-01-07 DIAGNOSIS — Z79899 Other long term (current) drug therapy: Secondary | ICD-10-CM | POA: Insufficient documentation

## 2021-01-07 DIAGNOSIS — Z5941 Food insecurity: Secondary | ICD-10-CM | POA: Insufficient documentation

## 2021-01-07 DIAGNOSIS — Z8614 Personal history of Methicillin resistant Staphylococcus aureus infection: Secondary | ICD-10-CM

## 2021-01-07 DIAGNOSIS — F1010 Alcohol abuse, uncomplicated: Secondary | ICD-10-CM | POA: Insufficient documentation

## 2021-01-07 DIAGNOSIS — I5022 Chronic systolic (congestive) heart failure: Secondary | ICD-10-CM

## 2021-01-07 DIAGNOSIS — F1411 Cocaine abuse, in remission: Secondary | ICD-10-CM

## 2021-01-07 DIAGNOSIS — I2510 Atherosclerotic heart disease of native coronary artery without angina pectoris: Secondary | ICD-10-CM

## 2021-01-07 LAB — BASIC METABOLIC PANEL
Anion gap: 8 (ref 5–15)
BUN: 12 mg/dL (ref 6–20)
CO2: 31 mmol/L (ref 22–32)
Calcium: 9.6 mg/dL (ref 8.9–10.3)
Chloride: 98 mmol/L (ref 98–111)
Creatinine, Ser: 0.84 mg/dL (ref 0.61–1.24)
GFR, Estimated: >60 mL/min (ref >60)
Glucose, Bld: 108 mg/dL — ABNORMAL HIGH (ref 70–99)
Potassium: 4.4 mmol/L (ref 3.5–5.1)
Sodium: 137 mmol/L (ref 135–145)

## 2021-01-07 LAB — DIGOXIN LEVEL: Digoxin Level: 0.8 ng/mL (ref 0.8–2.0)

## 2021-01-07 MED ORDER — FUROSEMIDE 20 MG PO TABS: 20.0000 mg | ORAL_TABLET | Freq: Every day | ORAL | 3 refills | Status: DC | PRN

## 2021-01-07 MED ORDER — FUROSEMIDE 20 MG PO TABS
20.0000 mg | ORAL_TABLET | Freq: Every day | ORAL | 3 refills | Status: DC | PRN
  Filled 2021-01-07: qty 30, 30d supply, fill #0

## 2021-01-07 MED ORDER — SPIRONOLACTONE 25 MG PO TABS
25.0000 mg | ORAL_TABLET | Freq: Every day | ORAL | 3 refills | Status: DC
  Filled 2021-01-07: qty 30, 30d supply, fill #0

## 2021-01-07 MED ORDER — DAPAGLIFLOZIN PROPANEDIOL 10 MG PO TABS
10.0000 mg | ORAL_TABLET | Freq: Every day | ORAL | 3 refills | Status: DC
  Filled 2021-01-07: qty 30, 30d supply, fill #0

## 2021-01-07 MED ORDER — ATORVASTATIN CALCIUM 40 MG PO TABS
40.0000 mg | ORAL_TABLET | Freq: Every day | ORAL | 3 refills | Status: DC
  Filled 2021-01-07: qty 30, 30d supply, fill #0

## 2021-01-07 MED ORDER — ENTRESTO 24-26 MG PO TABS: 1.0000 | ORAL_TABLET | Freq: Two times a day (BID) | ORAL | 3 refills | Status: DC

## 2021-01-07 MED ORDER — DIGOXIN 125 MCG PO TABS
0.1250 mg | ORAL_TABLET | Freq: Every day | ORAL | 3 refills | Status: DC
  Filled 2021-01-07: qty 30, 30d supply, fill #0

## 2021-01-07 NOTE — Telephone Encounter (Signed)
Advanced Heart Failure Patient Advocate Encounter  Called [employer] to check the status of the patient's application. Representative stated that they have a new fax number and did not receive the application. Refaxed application to new fax number.  Sent in [employer] application for [place] assistance via fax.  Will follow up.

## 2021-01-07 NOTE — Progress Notes (Signed)
CSW met with patient in the clinic who reports he has not heard anything back from [place]. Patient shared he continues to have financial strains and has an upcoming bill that he is unsure about payment. CSW provided supportive intervention and asked patient to bring bill to next clinic appointment. CSW provided a [employer] card to assist with food and household needs as needed. Patient grateful for the support and will contact CSW if further develops with his medicaid. Raquel Sarna, [place], [place]

## 2021-01-07 NOTE — Patient Instructions (Signed)
Stop Losartan  Start Entresto 24/26 mg Twice daily   Take Furosemide 20 mg Daily ONLY AS NEEDED  Labs done today, your results will be available in MyChart, we will contact you for abnormal readings.  Your physician recommends that you return for lab work in: 1-2 weeks  Please follow up with our heart failure pharmacist in 3-4 weeks  Your physician recommends that you schedule a follow-up appointment in: 6 weeks  Do the following things EVERYDAY: Weigh yourself in the morning before breakfast. Write it down and keep it in a log. Take your medicines as prescribed Eat low salt foods--Limit salt (sodium) to 2000 mg per day.  Stay as active as you can everyday Limit all fluids for the day to less than 2 liters  If you have any questions or concerns before your next appointment please send [place] a message through Dawn or call our office at [phone].    TO LEAVE A MESSAGE FOR THE NURSE SELECT OPTION 2, PLEASE LEAVE A MESSAGE INCLUDING: YOUR NAME DATE OF BIRTH CALL BACK NUMBER REASON FOR CALL**this is important as we prioritize the call backs  YOU WILL RECEIVE A CALL BACK THE SAME DAY AS LONG AS YOU CALL BEFORE 4:00 PM  milAt the [facility], you and your health needs are our priority. As part of our continuing mission to provide you with exceptional heart care, we have created designated Provider Care Teams. These Care Teams include your primary Cardiologist (physician) and Advanced Practice Providers (APPs- Physician Assistants and Nurse Practitioners) who all work together to provide you with the care you need, when you need it.   You may see any of the following providers on your designated Care Team at your next follow up: Dr Arvilla Meres Dr Marca Ancona Dr Brandon Melnick, NP Robbie Lis, [place] Mikki Santee Karle Plumber, PharmD   Please be sure to bring in all your medications bottles to every appointment.

## 2021-01-11 NOTE — Addendum Note (Signed)
Encounter addended by: Noralee Space, RN on: 01/11/2021 9:20 AM  Actions taken: Order list changed, Diagnosis association updated

## 2021-01-11 NOTE — Telephone Encounter (Addendum)
Advanced Heart Failure Patient Advocate Encounter  Called [employer] to check the status of the patient's application. Representative stated that they would send the application over to the review team to be reviewed.  Will follow up.

## 2021-01-12 ENCOUNTER — Other Ambulatory Visit (HOSPITAL_COMMUNITY): Payer: Self-pay

## 2021-01-14 ENCOUNTER — Telehealth (HOSPITAL_COMMUNITY): Payer: Self-pay | Admitting: Pharmacy Technician

## 2021-01-14 NOTE — Telephone Encounter (Signed)
Advanced Heart Failure Patient Advocate Encounter  Sent in [employer] application via fax.  Will follow up.  

## 2021-01-18 NOTE — Telephone Encounter (Signed)
Advanced Heart Failure Patient Advocate Encounter   Patient was approved to receive Entresto from [employer]  Patient ID: [id number] Effective dates: 01/17/21 through 01/17/22  Called and spoke with the patient.   Archer Asa, CPhT

## 2021-01-18 NOTE — Progress Notes (Addendum)
PCP: Pcp, No Cardiology: Dr. Shirlee Latch  HPI:  Kyle Peck is a 60 year old with a history of arthritis, ETOH abuse, tobacco abuse, THC use, and cocaine abuse.  Presented to [facility] on 10/21/20 with progressive shortness of breath and fatigue. CXR with pulmonary edema. BNP 2252. Echo showed severely reduced EF 15% and mildly reduced RV. IV Lasix started.  He developed lethargy with respiratory failure, suspect ETOH withdrawal. Required intubation 10/24/20. Became hypotensive requiring norepinephrine and vasopressin, he developed AKI and had CVVH transiently. He was found to have MRSA bacteremia, possibly from PNA.  TEE showed no endocarditis.  Eventually he had right and left heart cath, showing severe 3 vessel disease.  We recommended CABG but wanted MRI viability study first.  Patient decided to leave AMA.  He did get his medications prior to discharge and consented to wear a Lifevest.     Recently presented to HF Clinic for follow up 01/07/21. Overall was feeling great. 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 Denied increasing SOB, CP, dizziness, edema, or PND/Orthopnea. Appetite was ok. No fever or chills. Weight at home was 181 pounds. Reported taking all medications. He had not been wearing his LifeVest because he was back at work outside sweating. Stated he was told vest may get false reading if he was riding lawnmower and could potentially shock him.  Today he returns to HF clinic for pharmacist medication titration. At last visit with APP,  losartan was discontinued and Entresto 24/26 mg BID was initiated. Lasix was changed to PRN only. Overall he is feeling well from a heart failure perspective today. Notes that he has developed acne and has been having mild tremors since last visit. Neither of these symptoms have been associated with Entresto. Will check dig level today as toxicity could lead to tremors - of note he had already taken his digoxin this morning which will influence level.  Tremors may also be related to anxiety. He did complain of slight dizziness. He thinks this is because of allergies or an inner ear infection, apparently it happens every August per patient. BP has been stable at home at 120/70s. BP in clinic 110/68. Breathing is much better. No SOB/DOE. Weight at home has been stable 178-180 lbs. He has not needed any PRN furosemide. No LEE, PND or orthopnea. He says one of his LifeVest  batteries died, plans to call company. He has received shipment of Farxiga from [employer] but has not received Entresto from [employer] yet.    HF Medications: Entresto 24/26 mg BID Spironolactone 25 mg daily Farxiga 10 mg daily Digoxin 0.125 mg daily Furosemide 20 mg PRN   Does the patient have any problems obtaining medications due to transportation or finances?   No insurance. Has Patient assistance for Sherryll [employer]) and Marcelline Deist ([employer]). Has not received shipment of Entresto yet - encouraged patient to call [employer] today for shipment. Receives other HF medications at [facility] through [employer].  Understanding of regimen: fair Understanding of indications: fair Potential of compliance: fair Patient understands to avoid NSAIDs. Patient understands to avoid decongestants.    Pertinent Lab Values: 01/07/21: Serum creatinine 0.84, BUN 12, Potassium 4.4, Sodium 137, Digoxin 0.8 ng/mL   Vital Signs: Weight: 181.6 lbs (last clinic weight: 183.4 lbs) Blood pressure: 110/68  Heart rate: 80   Assessment/Plan: 1. Chronic systolic CHF: Ischemic cardiomyopathy.  Echo in 10/2020 with EF 15%, moderate LV dilation, mildly decreased RV function, mild Kyle. Ischemic cardiomyopathy based on 11/2020 cath. RHC in  10/2020 showed normal cardiac index.   - NYHA II, not volume overloaded on exam - BMET pending - Continue Lasix 20 mg PRN only. - Start carvedilol 3.125 mg BID - Continue Entresto 24/26 mg BID.  - Continue spironolactone 25 mg daily.   - Continue Farxiga 10 mg daily.  - Continue  digoxin 0.125 mg daily - Strongly encouraged compliance w/ Lifevest.  Hopefully will get CABG, then will need repeat echo at 3 months post-CABG to decide on ICD. Has cardiac surgery follow up 02/10/21. 2. ETOH abuse: He has not had anything to drink since leaving the hospital.  - I urged that he remain abstinent.  3. Cocaine abuse: This was rare and he has not used since discharge.  4. MRSA bacteremia: ?PNA source.  TEE 10/2020 showed no endocarditis. - He has completed Zyvox course.  Seen by ID. 5 CAD: Cath 11/2020 with occluded ostial PDA, mid ramus, and proximal LAD, all with collaterals.  No chest pain, presentation in 10/2020 with CHF. - Continue ASA 81 and statin.  - We had wanted to get a cardiac MRI.  However, he does not have insurance and left the hospital AMA before cMRI could be done. Without insurance, cannot get an MRI. Would favor proceeding with CABG given clinically stable and preserved cardiac output on RHC. - Referred to Dr. Laneta Simmers w/ TCTS for CABG consideration. Appointment scheduled for 02/10/21. 6. Insomnia: Continue trazadone 100 mg at night. - QTc 420 ms. 7. Allergic Rhinitis? - Take Zyrtec and Flonase for allergies. If does not improve, will assess for ear infection. Discussed with Kyle Rome, FNP.   Follow up in 2 weeks with APP Clinic.    Karle Plumber, PharmD, BCPS, BCCP, CPP Heart Failure Clinic Pharmacist [phone]

## 2021-01-19 NOTE — Telephone Encounter (Signed)
Advanced Heart Failure Patient Advocate Encounter  Called [employer] to check the status of the patient's application. Representative stated that they have everything needed to process the application and would send it over to the review. I explained that the same thing was relayed on the 12th and asked for an expedited review. The representative stated that she would request that and hope to have an answer by Friday.

## 2021-01-20 NOTE — Telephone Encounter (Signed)
Advanced Heart Failure Patient Advocate Encounter   Patient was approved to receive Corlanor from [place]  Patient ID: [id number] Effective dates: 01/19/21 through 01/19/22  [facility] and asked the application to be placed on hold. When the patient start Corlanor, he will have to call to request a refill.   Archer Asa, CPhT

## 2021-01-26 ENCOUNTER — Telehealth (HOSPITAL_COMMUNITY): Payer: Self-pay | Admitting: Licensed Clinical Social Worker

## 2021-01-26 NOTE — Telephone Encounter (Signed)
CSW received a call from patient's wife stating she has been getting calls from the [employer] and others regarding medical bills. She is unsure how and where to follow up on medicaid application. CSW provided [employer] contact information as they assisted with completion of medicaid application. Wife grateful and will follow up and return call to CSW if needed. Lasandra Beech, LCSW, CCSW-MCS [phone]

## 2021-02-01 ENCOUNTER — Other Ambulatory Visit: Payer: Self-pay

## 2021-02-01 ENCOUNTER — Ambulatory Visit (HOSPITAL_COMMUNITY)
Admission: RE | Admit: 2021-02-01 | Discharge: 2021-02-01 | Disposition: A | Discharge status: Home / Self Care | Payer: Self-pay | Source: Ambulatory Visit | Attending: Internal Medicine | Admitting: Internal Medicine

## 2021-02-01 ENCOUNTER — Telehealth (HOSPITAL_COMMUNITY): Payer: Self-pay | Admitting: Pharmacist

## 2021-02-01 ENCOUNTER — Other Ambulatory Visit (HOSPITAL_COMMUNITY): Payer: Self-pay

## 2021-02-01 VITALS — BP 110/68 | HR 80 | Wt 181.6 lb

## 2021-02-01 DIAGNOSIS — F1010 Alcohol abuse, uncomplicated: Secondary | ICD-10-CM | POA: Insufficient documentation

## 2021-02-01 DIAGNOSIS — R42 Dizziness and giddiness: Secondary | ICD-10-CM | POA: Insufficient documentation

## 2021-02-01 DIAGNOSIS — I5022 Chronic systolic (congestive) heart failure: Secondary | ICD-10-CM

## 2021-02-01 DIAGNOSIS — G4700 Insomnia, unspecified: Secondary | ICD-10-CM | POA: Insufficient documentation

## 2021-02-01 DIAGNOSIS — Z7901 Long term (current) use of anticoagulants: Secondary | ICD-10-CM | POA: Insufficient documentation

## 2021-02-01 DIAGNOSIS — I2510 Atherosclerotic heart disease of native coronary artery without angina pectoris: Secondary | ICD-10-CM | POA: Insufficient documentation

## 2021-02-01 DIAGNOSIS — Z79899 Other long term (current) drug therapy: Secondary | ICD-10-CM | POA: Insufficient documentation

## 2021-02-01 DIAGNOSIS — F1410 Cocaine abuse, uncomplicated: Secondary | ICD-10-CM | POA: Insufficient documentation

## 2021-02-01 DIAGNOSIS — I255 Ischemic cardiomyopathy: Secondary | ICD-10-CM | POA: Insufficient documentation

## 2021-02-01 DIAGNOSIS — J309 Allergic rhinitis, unspecified: Secondary | ICD-10-CM | POA: Insufficient documentation

## 2021-02-01 DIAGNOSIS — B9562 Methicillin resistant Staphylococcus aureus infection as the cause of diseases classified elsewhere: Secondary | ICD-10-CM | POA: Insufficient documentation

## 2021-02-01 LAB — BASIC METABOLIC PANEL
Anion gap: 7 (ref 5–15)
BUN: 9 mg/dL (ref 6–20)
CO2: 31 mmol/L (ref 22–32)
Calcium: 9.7 mg/dL (ref 8.9–10.3)
Chloride: 101 mmol/L (ref 98–111)
Creatinine, Ser: 1.05 mg/dL (ref 0.61–1.24)
GFR, Estimated: >60 mL/min (ref >60)
Glucose, Bld: 102 mg/dL — ABNORMAL HIGH (ref 70–99)
Potassium: 4.8 mmol/L (ref 3.5–5.1)
Sodium: 139 mmol/L (ref 135–145)

## 2021-02-01 LAB — DIGOXIN LEVEL: Digoxin Level: 1.2 ng/mL (ref 0.8–2.0)

## 2021-02-01 MED ORDER — CARVEDILOL 3.125 MG PO TABS
3.1250 mg | ORAL_TABLET | Freq: Two times a day (BID) | ORAL | 11 refills | Status: DC
  Filled 2021-02-01: qty 30, 15d supply, fill #0

## 2021-02-01 MED ORDER — ATORVASTATIN CALCIUM 40 MG PO TABS
40.0000 mg | ORAL_TABLET | Freq: Every day | ORAL | 3 refills | Status: DC
  Filled 2021-02-01: qty 30, 30d supply, fill #0
  Filled 2021-03-07: qty 30, 30d supply, fill #1
  Filled 2021-04-12: qty 30, 30d supply, fill #2
  Filled 2021-05-10: qty 30, 30d supply, fill #3

## 2021-02-01 MED ORDER — SPIRONOLACTONE 25 MG PO TABS
25.0000 mg | ORAL_TABLET | Freq: Every day | ORAL | 3 refills | Status: DC
  Filled 2021-02-01: qty 30, 30d supply, fill #0
  Filled 2021-03-07: qty 30, 30d supply, fill #1
  Filled 2021-04-12: qty 30, 30d supply, fill #2
  Filled 2021-05-10: qty 30, 30d supply, fill #3

## 2021-02-01 MED ORDER — DIGOXIN 125 MCG PO TABS
0.1250 mg | ORAL_TABLET | Freq: Every day | ORAL | 11 refills | Status: DC
  Filled 2021-02-01: qty 30, 30d supply, fill #0

## 2021-02-01 MED ORDER — CARVEDILOL 3.125 MG PO TABS
3.1250 mg | ORAL_TABLET | Freq: Two times a day (BID) | ORAL | 11 refills | Status: DC
  Filled 2021-02-01: qty 60, 30d supply, fill #0
  Filled 2021-03-07: qty 60, 30d supply, fill #1
  Filled 2021-04-12: qty 60, 30d supply, fill #2

## 2021-02-01 MED ORDER — ASPIRIN 81 MG PO CHEW
81.0000 mg | CHEWABLE_TABLET | Freq: Every day | ORAL | 11 refills | Status: DC
  Filled 2021-02-01: qty 30, 30d supply, fill #0
  Filled 2021-07-19: qty 30, 30d supply, fill #1
  Filled 2021-08-12: qty 30, 30d supply, fill #2
  Filled 2021-09-09: qty 30, 30d supply, fill #3
  Filled 2021-10-14: qty 30, 30d supply, fill #4
  Filled 2021-11-09: qty 30, 30d supply, fill #5
  Filled 2021-12-26: qty 30, 30d supply, fill #6
  Filled 2022-01-25: qty 30, 30d supply, fill #7

## 2021-02-01 MED ORDER — DIGOXIN 125 MCG PO TABS
0.0625 mg | ORAL_TABLET | Freq: Every day | ORAL | 11 refills | Status: DC
  Filled 2021-02-01 (×3): qty 15, 30d supply, fill #0
  Filled 2021-05-10: qty 15, 30d supply, fill #1
  Filled 2021-06-13: qty 15, 30d supply, fill #2
  Filled 2021-07-19: qty 15, 30d supply, fill #3
  Filled 2021-08-12: qty 15, 30d supply, fill #4
  Filled 2021-09-09: qty 15, 30d supply, fill #5
  Filled 2021-10-14: qty 15, 30d supply, fill #6
  Filled 2021-11-09: qty 15, 30d supply, fill #7
  Filled 2021-12-26: qty 15, 30d supply, fill #8
  Filled 2022-01-25: qty 15, 30d supply, fill #9

## 2021-02-01 MED ORDER — FUROSEMIDE 20 MG PO TABS
20.0000 mg | ORAL_TABLET | Freq: Every day | ORAL | 6 refills | Status: DC | PRN
  Filled 2021-02-01: qty 30, 30d supply, fill #0
  Filled 2021-03-07: qty 30, 30d supply, fill #1
  Filled 2021-04-12: qty 30, 30d supply, fill #2
  Filled 2021-05-10: qty 30, 30d supply, fill #3
  Filled 2021-06-13: qty 30, 30d supply, fill #4
  Filled 2021-07-19: qty 30, 30d supply, fill #5
  Filled 2021-08-12: qty 30, 30d supply, fill #6

## 2021-02-01 NOTE — Telephone Encounter (Signed)
Bmet result 02/01/21 returned after clinic visit. Digoxin level elevated. Hold digoxin for 2 days then decrease to 0.625 mg (1/2 tablet) daily. Repeat digoxin level in 2 weeks (instructed to not take digoxin before lab is drawn). Scr increased from last visit. Increase hydration. Patient expressed understanding.

## 2021-02-01 NOTE — Patient Instructions (Addendum)
It was a pleasure seeing you today!  MEDICATIONS: -We are changing your medications today -Start carvedilol 3.125 mg (1 tablet) twice daily -Take Zyrtec and Flonase every day. You can get these over the counter.  -[employer] so they will send you the [facility].  -Call if you have questions about your medications.  LABS: -We will call you if your labs need attention.  NEXT APPOINTMENT: Return to clinic in 2 weeks with [facility].  In general, to take care of your heart failure: -Limit your fluid intake to 2 Liters (half-gallon) per day.   -Limit your salt intake to ideally 2-3 grams (2000-3000 mg) per day. -Weigh yourself daily and record, and bring that "weight diary" to your next appointment.  (Weight gain of 2-3 pounds in 1 day typically means fluid weight.) -The medications for your heart are to help your heart and help you live longer.   -Please contact us before stopping any of your heart medications.  Call the clinic at [phone] with questions or to reschedule future appointments.

## 2021-02-01 NOTE — Addendum Note (Signed)
Encounter addended by: Evon Slack, RPH-CPP on: 02/01/2021 4:18 PM  Actions taken: Clinical Note Signed

## 2021-02-10 ENCOUNTER — Encounter: Payer: Self-pay | Admitting: Surgery

## 2021-02-17 ENCOUNTER — Telehealth (HOSPITAL_COMMUNITY): Payer: Self-pay

## 2021-02-17 NOTE — Telephone Encounter (Signed)
Called to confirm/remind patient of their appointment at [facility] on 02/18/21.   Patient reminded to bring all medications and/or complete list.  Confirmed patient has transportation. Gave directions, instructed to utilize valet parking.  Confirmed appointment prior to ending call.   

## 2021-02-18 ENCOUNTER — Encounter (HOSPITAL_COMMUNITY): Payer: Self-pay

## 2021-02-22 ENCOUNTER — Other Ambulatory Visit: Payer: Self-pay

## 2021-02-22 ENCOUNTER — Institutional Professional Consult (permissible substitution) (INDEPENDENT_AMBULATORY_CARE_PROVIDER_SITE_OTHER): Payer: Self-pay | Admitting: Surgery

## 2021-02-22 ENCOUNTER — Encounter: Payer: Self-pay | Admitting: Surgery

## 2021-02-22 VITALS — BP 119/71 | HR 92 | Resp 20 | Ht 69.0 in | Wt 182.0 lb

## 2021-02-22 DIAGNOSIS — I2510 Atherosclerotic heart disease of native coronary artery without angina pectoris: Secondary | ICD-10-CM

## 2021-02-22 NOTE — Progress Notes (Signed)
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  He needs echo to determine need for ICD.  Please arrange. Make sure he has followup with me.

## 2021-02-22 NOTE — Progress Notes (Signed)
Cardiothoracic Surgery Consultation  PCP is Pcp, No Referring Provider is Laurey Morale, MD  Chief Complaint  Patient presents with   Coronary Artery Disease    Surgical consult, Cardiac Cath 11/04/20, ECHO 10/27/20, PFT's 11/04/20    HPI:  The patient is a 60 year old gentleman with a history of arthritis and multi substance abuse including alcohol, tobacco, marijuana, and cocaine who presented to [facility] on 10/21/2020 with progressive shortness of breath and fatigue.  Chest x-ray showed pulmonary edema and his BNP was 2252.  An echo at that time showed an ejection fraction of 15% with a markedly dilated left ventricle with a diastolic diameter of 6.5 cm and systolic diameter of 5.6 cm.  RV function was mildly reduced.  He was treated with diuresis.  He developed lethargy and respiratory failure with a suspicion of EtOH withdrawal and required intubation on 10/24/2020.  He required norepinephrine and vasopressin for hypotension and developed acute kidney injury with transient need of CRRT.  He was diagnosed with MRSA bacteremia felt to be possibly due to pneumonia.  He underwent a TEE showing no signs of endocarditis.  It showed an ejection fraction of 20% with global hypokinesis with a dilated left ventricle.  There is mild RV systolic dysfunction.  There is no mitral or tricuspid regurgitation.  There is no aortic insufficiency or stenosis.  He underwent cardiac catheterization on 11/04/2020 by Dr. Shirlee Latch which showed occlusion of the proximal LAD with faint collaterals to a small diffusely diseased distal LAD. There was 70% proximal RCA stenosis followed by 100% mid RCA occlusion after a large acute marginal branch with faint filling of the posterior descending branch by collaterals that appeared small and diffusely diseased.  There is a small ramus branch that was occluded.  There was a large branching left marginal with no significant stenosis.  Surprisingly right and left heart filling  pressures were low with preserved cardiac output.  It was recommended that he be evaluated for coronary bypass surgery but it was felt that he needed a cardiac MR viability study first.  He decided to leave AMA.  He did get his medications prior to discharge and consented to wear a LifeVest although he said he never wore it.  He was seen in our office by Dr. Vickey Sages on 12/16/2020 for consideration of CABG.  The patient is here today with his wife.  He tells me that he feels well and has been back to work and remaining active at home with no shortness of breath, no chest pain or pressure, no fatigue, and no peripheral edema.  He has been taking his medications as directed.  He said that he quit smoking and drinking alcohol after his last admission. Past Medical History:  Diagnosis Date   Alcohol use    Arthritis    Marijuana use    Tobacco abuse     Past Surgical History:  Procedure Laterality Date   FRACTURE SURGERY     cheek    RIGHT/LEFT HEART CATH AND CORONARY ANGIOGRAPHY N/A 11/04/2020   Procedure: RIGHT/LEFT HEART CATH AND CORONARY ANGIOGRAPHY;  Surgeon: Laurey Morale, MD;  Location: [facility] INVASIVE CV LAB;  Service: Cardiovascular;  Laterality: N/A;    Family History  Problem Relation Age of Onset   Congestive Heart Failure Mother    Congestive Heart Failure Father     Social History Social History   Tobacco Use   Smoking status: Former    Packs/day: 0.50    Types:  Cigars, Cigarettes   Smokeless tobacco: Never  Vaping Use   Vaping Use: Never used  Substance Use Topics   Alcohol use: Yes    Alcohol/week: 15.0 standard drinks    Types: 15 Shots of liquor per week    Comment: intermittently   Drug use: Yes    Frequency: 2.0 times per week    Types: Marijuana    Comment: "also around cocaine before"    Current Outpatient Medications  Medication Sig Dispense Refill   aspirin 81 MG chewable tablet Chew 1 tablet (81 mg total) by mouth daily. 30 tablet 11   atorvastatin  (LIPITOR) 40 MG tablet Take 1 tablet (40 mg total) by mouth daily. 30 tablet 3   carvedilol (COREG) 3.125 MG tablet Take 1 tablet (3.125 mg total) by mouth 2 (two) times daily. 60 tablet 11   dapagliflozin propanediol (FARXIGA) 10 MG TABS tablet Take 1 tablet (10 mg total) by mouth daily before breakfast. 30 tablet 3   Famotidine (PEPCID PO) Take 1 tablet by mouth daily as needed (indigestion).     furosemide (LASIX) 20 MG tablet Take 1 tablet (20 mg total) by mouth daily as needed. 30 tablet 6   sacubitril-valsartan (ENTRESTO) 24-26 MG Take 1 tablet by mouth 2 (two) times daily. 60 tablet 3   spironolactone (ALDACTONE) 25 MG tablet Take 1 tablet (25 mg total) by mouth daily. 30 tablet 3   traZODone (DESYREL) 100 MG tablet Take 1 tablet (100 mg total) by mouth at bedtime as needed for sleep. 30 tablet 3   digoxin (LANOXIN) 0.125 MG tablet Take 0.5 tablets (0.0625 mg total) by mouth daily. (Patient not taking: Reported on 02/22/2021) 15 tablet 11   No current facility-administered medications for this visit.    No Known Allergies  Review of Systems  Constitutional: Negative.  Negative for fatigue.  HENT: Negative.    Eyes: Negative.   Respiratory:  Negative for cough, chest tightness and shortness of breath.   Cardiovascular:  Negative for chest pain, palpitations and leg swelling.  Gastrointestinal: Negative.   Endocrine: Negative.   Genitourinary: Negative.   Musculoskeletal:  Positive for arthralgias.  Skin: Negative.   Allergic/Immunologic: Negative.   Neurological:  Negative for dizziness and syncope.  Hematological: Negative.   Psychiatric/Behavioral: Negative.     BP 119/71 (BP Location: Left Arm, Patient Position: Sitting)   Pulse 92   Resp 20   Ht 5\' 9"  (1.753 m)   Wt 182 lb (82.6 kg)   SpO2 100% Comment: RA  BMI 26.88 kg/m  Physical Exam Constitutional:      Appearance: Normal appearance. He is normal weight.  HENT:     Head: Normocephalic and atraumatic.  Eyes:      Extraocular Movements: Extraocular movements intact.     Conjunctiva/sclera: Conjunctivae normal.     Pupils: Pupils are equal, round, and reactive to light.  Neck:     Vascular: No carotid bruit.  Cardiovascular:     Rate and Rhythm: Normal rate and regular rhythm.     Pulses: Normal pulses.     Heart sounds: Normal heart sounds. No murmur heard. Pulmonary:     Effort: Pulmonary effort is normal.     Breath sounds: Normal breath sounds.  Abdominal:     General: Abdomen is flat.     Palpations: Abdomen is soft.  Musculoskeletal:        General: No swelling or tenderness.     Cervical back: Normal range of motion and neck  supple.  Skin:    General: Skin is warm and dry.  Neurological:     General: No focal deficit present.     Mental Status: He is alert and oriented to person, place, and time.  Psychiatric:        Mood and Affect: Mood normal.        Behavior: Behavior normal.     Diagnostic Tests:  ECHOCARDIOGRAM LIMITED REPORT         Patient Name:   Kyle Peck Date of Exam: 10/26/2020  Medical Rec #:  [id number]    Height:       67.0 in  Accession #:    [id number]   Weight:       211.4 lb  Date of Birth:  [birth year]    BSA:          2.071 m  Patient Age:    60 years     BP:           114/82 mmHg  Patient Gender: M            HR:           100 bpm.  Exam Location:  Inpatient   Procedure: Limited Echo   Indications:    Bacteremia     History:        Patient has prior history of Echocardiogram examinations,  most                  recent 10/21/2020. CHF; Risk Factors:Current Smoker and  Alcohol                  and Cocaine abuse.     Sonographer:    Thurman Coyer RDCS (AE)  Referring Phys: [phone] WHITNEY F DAVIS   IMPRESSIONS     1. Small filamentous strands noted on the ventricular surface of mitral  valve leaflets (frame#21), consider TEE for further evaluation.   2. Left ventricular ejection fraction, by estimation, is 20 to 25%. The  left ventricle  has severely decreased function. The left ventricle  demonstrates global hypokinesis.   3. Right ventricular systolic function is normal. The right ventricular  size is normal.   4. The mitral valve is normal in structure. No evidence of mitral valve  regurgitation. No evidence of mitral stenosis.   5. The aortic valve is normal in structure. There is mild calcification  of the aortic valve. There is mild thickening of the aortic valve. Aortic  valve regurgitation is not visualized. Mild to moderate aortic valve  sclerosis/calcification is present,  without any evidence of aortic stenosis.   6. The inferior vena cava is normal in size with greater than 50%  respiratory variability, suggesting right atrial pressure of 3 mmHg.   Comparison(s): ECHO 10/21/20: EF 15%. Mildly improved.   FINDINGS   Left Ventricle: Left ventricular ejection fraction, by estimation, is 20  to 25%. The left ventricle has severely decreased function. The left  ventricle demonstrates global hypokinesis. The left ventricular internal  cavity size was normal in size. There  is no left ventricular hypertrophy.   Right Ventricle: The right ventricular size is normal. No increase in  right ventricular wall thickness. Right ventricular systolic function is  normal.   Left Atrium: Left atrial size was normal in size.   Right Atrium: Right atrial size was normal in size.   Pericardium: There is no evidence of pericardial effusion.   Mitral Valve: The mitral valve is normal in structure.  No evidence of  mitral valve stenosis.   Tricuspid Valve: The tricuspid valve is normal in structure. Tricuspid  valve regurgitation is not demonstrated. No evidence of tricuspid  stenosis.   Aortic Valve: The aortic valve is normal in structure. There is mild  calcification of the aortic valve. There is mild thickening of the aortic  valve. Aortic valve regurgitation is not visualized. Mild to moderate  aortic valve  sclerosis/calcification is  present, without any evidence of aortic stenosis.   Pulmonic Valve: The pulmonic valve was normal in structure. Pulmonic valve  regurgitation is not visualized. No evidence of pulmonic stenosis.   Aorta: The aortic root is normal in size and structure.   Venous: The inferior vena cava is normal in size with greater than 50%  respiratory variability, suggesting right atrial pressure of 3 mmHg.   IAS/Shunts: No atrial level shunt detected by color flow Doppler.   Additional Comments: Small filamentous strands noted on the ventricular  surface of mitral valve leaflets (frame#21), consider TEE for further  evaluation.  Donato Schultz MD  Electronically signed by Donato Schultz MD  Signature Date/Time: 10/26/2020/11:30:55 AM         Final     Physicians  Panel Physicians Referring Physician Case Authorizing Physician  Laurey Morale, MD (Primary)     Procedures  RIGHT/LEFT HEART CATH AND CORONARY ANGIOGRAPHY   Conclusion    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 RPDA lesion is 100% stenosed. Ramus lesion is 100% stenosed. Prox LAD lesion is 100% stenosed.   1. Low filling pressures.  2. Preserved cardiac output.  3. The ostial PDA, proximal LAD, and mid ramus are totally occluded with collaterals.     Will evaluate for CABG.  Arrange MRI viability study.    Surgeon Notes    10/27/2020  9:01 AM CV Procedure signed by Laurey Morale, MD   Procedural Details  Technical Details Procedure: Right Heart Cath, Left Heart Cath, Selective Coronary Angiography  Indication: CHF, cardiomyopathy   Procedural Details: The right brachial and radial areas were prepped, draped, and anesthetized with 1% lidocaine. There was a pre-existing peripheral IV in the right brachial area that was replaced with a 6/7 venous sheath.  A Swan-Ganz catheter was used for the right heart catheterization. Standard protocol was followed for recording of right heart pressures and  sampling of oxygen saturations. Fick and thermodilution cardiac output was calculated. The right radial artery was entered using modified Seldinger technique and ultrasound guidance, a 47F sheath was placed.  The patient received 3 mg IA verapamil and weight-based IV heparin.  Standard Judkins catheters were used for selective coronary angiography. There were no immediate procedural complications. The patient was transferred to the post catheterization recovery area for further monitoring.   Estimated blood loss <50 mL.   During this procedure medications were administered to achieve and maintain moderate conscious sedation while the patient's heart rate, blood pressure, and oxygen saturation were continuously monitored and I was present face-to-face 100% of this time.   Medications (Filter: Administrations occurring from 0728 to 0902 on 11/04/20)  important  Continuous medications are totaled by the amount administered until 11/04/20 0902.   Heparin (Porcine) in NaCl 1000-0.9 UT/500ML-% SOLN (mL) Total volume:  1,000 mL Date/Time Rate/Dose/Volume Action   11/04/20 0757 500 mL Given   0758 500 mL Given    lidocaine (PF) (XYLOCAINE) 1 % injection (mL) Total volume:  4 mL Date/Time Rate/Dose/Volume Action   11/04/20 0803 2 mL Given  1610 2 mL Given    midazolam (VERSED) injection (mg) Total dose:  0.5 mg Date/Time Rate/Dose/Volume Action   11/04/20 0814 0.5 mg Given    fentaNYL (SUBLIMAZE) injection (mcg) Total dose:  12.5 mcg Date/Time Rate/Dose/Volume Action   11/04/20 0814 12.5 mcg Given    heparin sodium (porcine) injection (Units) Total dose:  4,000 Units Date/Time Rate/Dose/Volume Action   11/04/20 0828 4,000 Units Given    iohexol (OMNIPAQUE) 350 MG/ML injection (mL) Total volume:  70 mL Date/Time Rate/Dose/Volume Action   11/04/20 0855 70 mL Given    potassium chloride SA (KLOR-CON) CR tablet 60 mEq (mEq) Total dose:  60 mEq Dosing weight:  77.3 Date/Time  Rate/Dose/Volume Action   11/04/20 0728 *Not included in total MAR Hold   0754 60 mEq Given   0815 *Not included in total Automatically Held    0.9 %  sodium chloride infusion (mL/hr) Total volume:  516.71 mL Dosing weight:  78.2 Date/Time Rate/Dose/Volume Action   11/04/20 0831 999 mL/hr Rate/Dose Change    acetaminophen (TYLENOL) 160 MG/5ML solution 650 mg (mg) Total dose:  Cannot be calculated* Dosing weight:  85.3 *Administration dose not documented Date/Time Rate/Dose/Volume Action   11/04/20 0728 *Not included in total MAR Hold    chlordiazePOXIDE (LIBRIUM) capsule 10 mg (mg) Total dose:  Cannot be calculated* Dosing weight:  85.3 *Administration dose not documented Date/Time Rate/Dose/Volume Action   11/04/20 0728 *Not included in total MAR Hold    Chlorhexidine Gluconate Cloth 2 % PADS 6 each (each) Total dose:  Cannot be calculated* Dosing weight:  89.2 *Administration dose not documented Date/Time Rate/Dose/Volume Action   11/04/20 0728 *Not included in total MAR Hold    digoxin (LANOXIN) tablet 0.125 mg (mg) Total dose:  Cannot be calculated* Dosing weight:  85.3 *Administration dose not documented Date/Time Rate/Dose/Volume Action   11/04/20 0728 *Not included in total MAR Hold    diphenhydrAMINE (BENADRYL) capsule 25 mg (mg) Total dose:  Cannot be calculated* Dosing weight:  85.3 *Administration dose not documented Date/Time Rate/Dose/Volume Action   11/04/20 0728 *Not included in total MAR Hold    docusate sodium (COLACE) capsule 100 mg (mg) Total dose:  Cannot be calculated* Dosing weight:  85.3 *Administration dose not documented Date/Time Rate/Dose/Volume Action   11/04/20 0728 *Not included in total MAR Hold    enoxaparin (LOVENOX) injection 40 mg (mg) Total dose:  Cannot be calculated* Dosing weight:  90.7 *Administration dose not documented Date/Time Rate/Dose/Volume Action   11/04/20 0728 *Not included in total MAR Hold   0800 *Not included in  total Automatically Held    feeding supplement (ENSURE ENLIVE / ENSURE PLUS) liquid 237 mL (mL) Total dose:  Cannot be calculated* Dosing weight:  83.9 *Administration dose not documented Date/Time Rate/Dose/Volume Action   11/04/20 0728 *Not included in total MAR Hold    folic acid (FOLVITE) tablet 1 mg (mg) Total dose:  Cannot be calculated* Dosing weight:  85.3 *Administration dose not documented Date/Time Rate/Dose/Volume Action   11/04/20 0728 *Not included in total MAR Hold    furosemide (LASIX) tablet 40 mg (mg) Total dose:  Cannot be calculated* Dosing weight:  85.3 *Administration dose not documented Date/Time Rate/Dose/Volume Action   11/04/20 0728 *Not included in total MAR Hold   0853 *Not included in total MAR Unhold    guaiFENesin-dextromethorphan (ROBITUSSIN DM) 100-10 MG/5ML syrup 5 mL (mL) Total dose:  Cannot be calculated* Dosing weight:  85.3 *Administration dose not documented Date/Time Rate/Dose/Volume Action   11/04/20 0728 *Not  included in total MAR Hold    haloperidol lactate (HALDOL) injection 2 mg (mg) Total dose:  Cannot be calculated* Dosing weight:  90.4 *Administration dose not documented Date/Time Rate/Dose/Volume Action   11/04/20 0728 *Not included in total MAR Hold    losartan (COZAAR) tablet 25 mg (mg) Total dose:  Cannot be calculated* Dosing weight:  78.2 *Administration dose not documented Date/Time Rate/Dose/Volume Action   11/04/20 0728 *Not included in total MAR Hold   0853 *Not included in total MAR Unhold    multivitamin with minerals tablet 1 tablet (tablet) Total dose:  Cannot be calculated* Dosing weight:  83.9 *Administration dose not documented Date/Time Rate/Dose/Volume Action   11/04/20 0728 *Not included in total MAR Hold    ondansetron (ZOFRAN) injection 4 mg (mg) Total dose:  Cannot be calculated* Dosing weight:  95.8 *Administration dose not documented Date/Time Rate/Dose/Volume Action   11/04/20 0728 *Not  included in total MAR Hold    Oritavancin Diphosphate (ORBACTIV) 1,200 mg in dextrose 5 % IVPB (mg) Total dose:  Cannot be calculated* Dosing weight:  78.2 *Administration dose not documented Date/Time Rate/Dose/Volume Action   11/04/20 0728 *Not included in total MAR Hold    pantoprazole (PROTONIX) injection 40 mg (mg) Total dose:  Cannot be calculated* Dosing weight:  89.6 *Administration dose not documented Date/Time Rate/Dose/Volume Action   11/04/20 0728 *Not included in total MAR Hold    polyethylene glycol (MIRALAX / GLYCOLAX) packet 17 g (g) Total dose:  Cannot be calculated* Dosing weight:  85.3 *Administration dose not documented Date/Time Rate/Dose/Volume Action   11/04/20 0728 *Not included in total MAR Hold    QUEtiapine (SEROQUEL) tablet 100 mg (mg) Total dose:  Cannot be calculated* Dosing weight:  85.3 *Administration dose not documented Date/Time Rate/Dose/Volume Action   11/04/20 0728 *Not included in total MAR Hold    sodium chloride flush (NS) 0.9 % injection 10-40 mL (mL) Total dose:  Cannot be calculated* Dosing weight:  90.1 *Administration dose not documented Date/Time Rate/Dose/Volume Action   11/04/20 0728 *Not included in total MAR Hold    sodium chloride flush (NS) 0.9 % injection 10-40 mL (mL) Total dose:  Cannot be calculated* Dosing weight:  90.1 *Administration dose not documented Date/Time Rate/Dose/Volume Action   11/04/20 0728 *Not included in total MAR Hold    sodium chloride flush (NS) 0.9 % injection 3 mL (mL) Total dose:  Cannot be calculated* Dosing weight:  89.3 *Administration dose not documented Date/Time Rate/Dose/Volume Action   11/04/20 0728 *Not included in total MAR Hold    sodium chloride flush (NS) 0.9 % injection 3 mL (mL) Total dose:  Cannot be calculated* Dosing weight:  78.2 *Administration dose not documented Date/Time Rate/Dose/Volume Action   11/04/20 0728 *Not included in total MAR Hold    spironolactone  (ALDACTONE) tablet 12.5 mg (mg) Total dose:  Cannot be calculated* Dosing weight:  89.2 *Administration dose not documented Date/Time Rate/Dose/Volume Action   11/04/20 0728 *Not included in total MAR Hold    temazepam (RESTORIL) capsule 30 mg (mg) Total dose:  Cannot be calculated* Dosing weight:  85.3 *Administration dose not documented Date/Time Rate/Dose/Volume Action   11/04/20 0728 *Not included in total MAR Hold    vancomycin (VANCOREADY) IVPB 1250 mg/250 mL (mL/hr) Total dose:  Cannot be calculated* Dosing weight:  78.2 *Administration dose not documented Date/Time Rate/Dose/Volume Action   11/04/20 0728 *Not included in total MAR Hold    Sedation Time  Sedation Time Physician-1: 31 minutes 26 seconds Radiation/Fluoro  Fluoro time:  4.6 (min) DAP: 51422 (mGycm2) Cumulative Air Kerma: 814 (mGy) Coronary Findings  Diagnostic Dominance: Right Left Anterior Descending  Collaterals  Dist LAD filled by collaterals from 3rd RPL.    Prox LAD lesion is 100% stenosed.  Ramus Intermedius  Collaterals  Ramus filled by collaterals from 1st Mrg.    Ramus lesion is 100% stenosed.  Right Coronary Artery  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  Right Posterior Descending Artery  Collaterals  RPDA filled by collaterals from 1st RPL.    RPDA lesion is 100% stenosed.  Intervention  No interventions have been documented. Right Heart  Right Heart Pressures RHC Procedural Findings: Hemodynamics (mmHg) RA mean 3 RV 22/1 PA 26/4, mean 17 PCWP mean 5 LV 82/10 AO 81/54  Oxygen saturations: PA 70% AO 99%  Cardiac Output (Fick) 5.24  Cardiac Index (Fick) 2.76   Cardiac Output (Thermo) 5.71  Cardiac Index (Thermo) 3.01   Coronary Diagrams  Diagnostic Dominance: Right Intervention  Implants     No implant documentation for this case.   Syngo Images   Show images for CARDIAC CATHETERIZATION Images on Long Term Storage   Show images for [place], Lakyn Link to  Procedure Log  Procedure Log    Hemo Data  Flowsheet Row Most Recent Value  Fick Cardiac Output 5.24 L/min  Fick Cardiac Output Index 2.76 (L/min)/BSA  Thermal Cardiac Output 5.71 L/min  Thermal Cardiac Output Index 3.01 (L/min)/BSA  Aortic Mean Gradient 2.77 mmHg  Aortic Peak Gradient 0 mmHg  Aortic Valve Area >3.50  Aortic Value Area Index 1.85 cm2/BSA  RA A Wave 3 mmHg  RA V Wave 3 mmHg  RA Mean 1 mmHg  RV Systolic Pressure 22 mmHg  RV Diastolic Pressure -2 mmHg  RV EDP 0 mmHg  PA Systolic Pressure 26 mmHg  PA Diastolic Pressure 4 mmHg  PA Mean 17 mmHg  PW A Wave 7 mmHg  PW V Wave 6 mmHg  PW Mean 6 mmHg  AO Systolic Pressure 77 mmHg  AO Diastolic Pressure 55 mmHg  AO Mean 64 mmHg  LV Systolic Pressure 82 mmHg  LV Diastolic Pressure 4 mmHg  LV EDP 10 mmHg  AOp Systolic Pressure 81 mmHg  AOp Diastolic Pressure 54 mmHg  AOp Mean Pressure 65 mmHg  LVp Systolic Pressure 81 mmHg  LVp Diastolic Pressure 5 mmHg  LVp EDP Pressure 12 mmHg  QP/QS 1  TPVR Index 6.15 HRUI  TSVR Index 23.16 HRUI  PVR SVR Ratio 0.19  TPVR/TSVR Ratio 0.27    Impression:  This 60 year old gentleman 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  His cardiac catheterization at that time showed occlusion of the proximal LAD and mid RCA with faint collaterals to small diffusely diseased distal vessels.  The main blood supply to his heart was a large branching obtuse marginal vessel.  Despite this he had normal right and left heart filling pressures and preserved cardiac output.  He is currently [place] Heart Association class I on medical therapy with no symptoms of ischemia or congestive heart failure.  I have personally reviewed his cardiac catheterization and echo studies.  I do not think his distal RCA or LAD are graftable and I would not recommend coronary bypass surgery in this patient.  His best  long-term results are going to be with continuing medical therapy and avoiding things that may cause myocardial loss including alcohol and cigarettes.  I reviewed  the catheterization and echo studies with him and his wife as well as my recommendations for continued medical therapy.  He is in full agreement since he feels so well.  He is questioning whether he needs to have the MR viability scan.  I think he should have a reassessment of his left ventricular ejection fraction to see if it is improved with medical therapy and for prognostic purposes.  He may benefit from ICD therapy if his ejection fraction remains low.  Plan:  He is going to continue follow-up with Dr. Shirlee Latch in the heart failure clinic.  I spent 40 minutes performing this consultation and > 50% of this time was spent face to face counseling and coordinating the care of this patient's severe multivessel coronary disease and severe left ventricular dysfunction.   Alleen Borne, MD [facility] [phone]

## 2021-02-25 ENCOUNTER — Telehealth (HOSPITAL_COMMUNITY): Payer: Self-pay

## 2021-02-25 NOTE — Telephone Encounter (Signed)
Called and left patient a vm to return a call to our office to confirm/remind patient of their appointment at the [facility] on 02/28/21.

## 2021-02-28 ENCOUNTER — Encounter (HOSPITAL_COMMUNITY): Payer: Self-pay

## 2021-03-07 ENCOUNTER — Other Ambulatory Visit (HOSPITAL_COMMUNITY): Payer: Self-pay

## 2021-03-08 ENCOUNTER — Other Ambulatory Visit (HOSPITAL_COMMUNITY): Payer: Self-pay

## 2021-03-29 ENCOUNTER — Telehealth (HOSPITAL_COMMUNITY): Payer: Self-pay

## 2021-03-29 NOTE — Telephone Encounter (Signed)
Called and left patient a detailed voice message to confirm/remind patient of their appointment at the [facility] on 03/30/21 and to give our office a callback at [phone]

## 2021-03-30 ENCOUNTER — Encounter (HOSPITAL_COMMUNITY): Payer: Self-pay

## 2021-04-06 ENCOUNTER — Telehealth (HOSPITAL_COMMUNITY): Payer: Self-pay | Admitting: Licensed Clinical Social Worker

## 2021-04-06 NOTE — Telephone Encounter (Signed)
CSW received call from patient's wife stating that they will be evicted in the next few days. Patient's wife states they have no income as both patient and wife are seeking disability. Wife was tearful and shared that they have lived in this home for 32 years and have raised their children in the home. Wife feeling overwhelmed and states "I don't have anyone to talk to but you". CSW suggested wife go to [employer] to inquire about housing options. CSW did share the challenges of finding housing with no household income to pay rent. Patient's wife grateful for the listening ear and will follow up with social services. CSW available as needed. Lasandra Beech, LCSW, CCSW-MCS [phone]

## 2021-04-12 ENCOUNTER — Other Ambulatory Visit (HOSPITAL_COMMUNITY): Payer: Self-pay

## 2021-04-13 ENCOUNTER — Telehealth (HOSPITAL_COMMUNITY): Payer: Self-pay | Admitting: Licensed Clinical Social Worker

## 2021-04-13 ENCOUNTER — Telehealth (HOSPITAL_COMMUNITY): Payer: Self-pay

## 2021-04-13 NOTE — Telephone Encounter (Signed)
Called to confirm/remind patient of their appointment at the [facility] on 04/14/21.   Patient reminded to bring all medications and/or complete list.  Confirmed patient has transportation. Gave directions, instructed to utilize valet parking.  Confirmed appointment prior to ending call.   

## 2021-04-13 NOTE — Telephone Encounter (Signed)
CSW received call from patient's wife. She reports that they were evicted yesterday and she is with family although patient was unable to come with her because of family conflict. She states that he is staying in another location but noted that he has not been feeling well and refuses to go to the Urgent Care. CSW encouraged wife to have patient go to Urgent Care if needed and not delay care and that his uninsured status would not prevent him from receiving care. She noted that patient also has a follow up appointment in the HF Clinic tomorrow. Wife states she has not gone to DSS in [place] regarding eviction because "they will put me in subsidized housing and they all have roaches". CSW encouraged her to go to DSS to inquire about options for emergency housing as they have no income and limited options for housing. Wife verbalizes understanding and will come to HF clinic appointment with patient tomorrow. Lasandra Beech, LCSW, CCSW-MCS [phone]

## 2021-04-13 NOTE — Progress Notes (Addendum)
AD[facility]ANCED [facility] FAIL[facility] CLIN[facility] NOTE  PCP: Pcp, No Cardiology: Dr. Shirlee Latch  HPI: Mr Vejar is a 60 year old with a history of arthritis, ETOH abuse, tobacco abuse, THC use, and cocaine abuse.    Presented to [facility] on 10/21/20 with progressive shortness of breath and fatigue. CXR with pulmonary edema. BNP 2252. Echo showed severely reduced EF 15% and mildly reduced RV.  He developed lethargy with respiratory failure, suspect ETOH withdrawal. Required intubation. Became hypotensive requiring norepinephrine and vasopressin, he developed AKI and had CVVH transiently. He was found to have MRSA bacteremia, possibly from PNA.  TEE showed no endocarditis.  Eventually he had right and left heart cath, showing severe 3 vessel disease.  We recommended CABG but wanted MRI viability study first.  Patient decided to leave AMA.  He did get his medications prior to discharge and consented to wear a Lifevest.    Last seen in HF clinic 08/22. Had not been wearing LifeVest.   He saw CTS on 02/22/21. Prior films reviewed, no good targets for CABG. Medical management recommended.  He is here today for HF follow-up. Denies any shortness of breath or CP. Doing well from cardiac perspective. Denies orthopnea, PND or leg edema. No dizziness. Unfortunately, he was recently evicted from his home of over 30 years. He is staying with his brother for now.   ECG Sinus rhythm with 1st degree AVB, 96 bpm  LifeVest interrogation (personally reviewed): Has not been wearing  Has been refraining from ETOH, tobacco and cocaine.  PMH: 1. ETOH abuse.  2. Smoking 3. Prior cocaine use.  4. MRSA bacteremia 5/22 - TEE (5/22): no endocarditis.  5. CAD: LHC (5/22) with totally occluded pLAD with collaterals, totally occluded ramus with collaterals, 70% proximal RCA, PDA totally occluded with collaterals. CABG recommended.  6. Chronic systolic CHF: Ischemic cardiomyopathy.  Echo (5/22) with EF 15%, moderate LV enlargement, moderate RV  enlargement with mildly decreased RV systolic function, mild MR.  - RHC (5/22): mean RA 3, PA 26/4, mean PCWP 5, CI 2.76 Fick, CI 3.01 thermo.   Social History   Socioeconomic History   Marital status: Married    Spouse name: Eber Jones   Number of children: Not on file   Years of education: Not on file   Highest education level: Not on file  Occupational History   Not on file  Tobacco Use   Smoking status: Former    Packs/day: 0.50    Types: Cigars, Cigarettes   Smokeless tobacco: Never  Vaping Use   Vaping Use: Never used  Substance and Sexual Activity   Alcohol use: Yes    Alcohol/week: 15.0 standard drinks    Types: 15 Shots of liquor per week    Comment: intermittently   Drug use: Yes    Frequency: 2.0 times per week    Types: Marijuana    Comment: "also around cocaine before"   Sexual activity: Not on file  Other Topics Concern   Not on file  Social History Narrative   Not on file   Social Determinants of Health   Financial Resource Strain: High Risk   Difficulty of Paying Living Expenses: Hard  Food Insecurity: Food Insecurity Present   Worried About Running Out of Food in the Last Year: Never true   Ran Out of Food in the Last Year: Sometimes true  Transportation Needs: Unmet Transportation Needs   Lack of Transportation (Medical): Yes   Lack of Transportation (Non-Medical): No  Physical Activity: Not on file  Stress: Not on file  Social Connections: Not on file  Intimate Partner Violence: Not on file   Family History  Problem Relation Age of Onset   Congestive Heart Failure Mother    Congestive Heart Failure Father    ROS: All systems reviewed and negative except as per HPI.   Current Outpatient Medications  Medication Sig Dispense Refill   aspirin 81 MG chewable tablet Chew 1 tablet (81 mg total) by mouth daily. 30 tablet 11   atorvastatin (LIPITOR) 40 MG tablet Take 1 tablet (40 mg total) by mouth daily. 30 tablet 3   dapagliflozin propanediol  (FARXIGA) 10 MG TABS tablet Take 1 tablet (10 mg total) by mouth daily before breakfast. 30 tablet 3   digoxin (LANOXIN) 0.125 MG tablet Take 0.5 tablets (0.0625 mg total) by mouth daily. 15 tablet 11   Famotidine (PEPCID PO) Take 1 tablet by mouth daily as needed (indigestion).     furosemide (LASIX) 20 MG tablet Take 1 tablet (20 mg total) by mouth daily as needed. 30 tablet 6   sacubitril-valsartan (ENTRESTO) 24-26 MG Take 1 tablet by mouth 2 (two) times daily. 60 tablet 3   spironolactone (ALDACTONE) 25 MG tablet Take 1 tablet (25 mg total) by mouth daily. 30 tablet 3   traZODone (DESYREL) 100 MG tablet Take 1 tablet (100 mg total) by mouth at bedtime as needed for sleep. 30 tablet 3   carvedilol (COREG) 6.25 MG tablet Take 1 tablet (6.25 mg total) by mouth 2 (two) times daily. 180 tablet 1   No current facility-administered medications for this encounter.   Wt Readings from Last 3 Encounters:  04/14/21 88.4 kg (194 lb 12.8 oz)  02/22/21 82.6 kg (182 lb)  02/01/21 82.4 kg (181 lb 9.6 oz)   BP 134/74   Pulse (!) 103   Wt 88.4 kg (194 lb 12.8 oz)   SpO2 98%   BMI 28.77 kg/m   General:  NAD. No resp difficulty HEENT: Normal Neck: Supple. No JVD. Carotids 2+ bilat; no bruits. No lymphadenopathy or thryomegaly appreciated. Cor: PMI nondisplaced. Regular rate & rhythm. No rubs, gallops or murmurs. Lungs: Clear Abdomen: Soft, nontender, nondistended. No hepatosplenomegaly. No bruits or masses. Good bowel sounds. Extremities: No cyanosis, clubbing, rash, edema Neuro: Alert & oriented x 3, cranial nerves grossly intact. Moves all 4 extremities w/o difficulty. Affect pleasant.  Assessment/Plan: 1. Chronic systolic CHF: Ischemic cardiomyopathy.  Echo in 5/22 with EF 15%, moderate LV dilation, mildly decreased RV function, mild MR. RHC in 5/22 showed normal cardiac index.   - NYHA II. Volume stable. Has lasix to use PRN - Continue Entresto 24/26 mg bid - Continue digoxin 0.0625. Reduced  in 08/22 d/t elevated level - Continue spironolactone 25 mg daily.   - Continue Farxiga 10 mg daily.  - Increase Coreg to 6.25 mg BID - Strongly encouraged compliance w/ Lifevest  - Echo to reassess LV function and EF, will need to refer for ICD for primary prevention if EF remains < or = 35%. QRS 100 ms - Digoxin level and BMET today 2. ETOH abuse: He has not had anything to drink since leaving the hospital.  - Encouraged him to continue to refrain from alcohol 3. Cocaine abuse: This was rare and he has not used since discharge.  4 CAD: Cath 6/22 with occluded ostial PDA, mid ramus, and proximal LAD, all with collaterals.   - Seen by CTS in 09/22, no good targets for CABG.  - Denies any angina -  Continue ASA 81 and statin.  5. Insomnia: Continue trazadone 100 mg at night. - QTc 449 ms  Followup: 4 months with Dr. Shirlee Latch  [facility], [facility] N PA-C 04/14/2021

## 2021-04-14 ENCOUNTER — Ambulatory Visit (HOSPITAL_COMMUNITY)
Admission: RE | Admit: 2021-04-14 | Discharge: 2021-04-14 | Disposition: A | Discharge status: Home / Self Care | Payer: Self-pay | Source: Ambulatory Visit | Attending: Cardiology | Admitting: Cardiology

## 2021-04-14 ENCOUNTER — Other Ambulatory Visit (HOSPITAL_COMMUNITY): Payer: Self-pay

## 2021-04-14 ENCOUNTER — Encounter (HOSPITAL_COMMUNITY): Payer: Self-pay

## 2021-04-14 VITALS — BP 134/74 | HR 103 | Wt 194.8 lb

## 2021-04-14 DIAGNOSIS — Z87891 Personal history of nicotine dependence: Secondary | ICD-10-CM | POA: Insufficient documentation

## 2021-04-14 DIAGNOSIS — I255 Ischemic cardiomyopathy: Secondary | ICD-10-CM | POA: Insufficient documentation

## 2021-04-14 DIAGNOSIS — F1410 Cocaine abuse, uncomplicated: Secondary | ICD-10-CM | POA: Insufficient documentation

## 2021-04-14 DIAGNOSIS — I2510 Atherosclerotic heart disease of native coronary artery without angina pectoris: Secondary | ICD-10-CM | POA: Insufficient documentation

## 2021-04-14 DIAGNOSIS — Z09 Encounter for follow-up examination after completed treatment for conditions other than malignant neoplasm: Secondary | ICD-10-CM | POA: Insufficient documentation

## 2021-04-14 DIAGNOSIS — Z79899 Other long term (current) drug therapy: Secondary | ICD-10-CM | POA: Insufficient documentation

## 2021-04-14 DIAGNOSIS — Z7982 Long term (current) use of aspirin: Secondary | ICD-10-CM | POA: Insufficient documentation

## 2021-04-14 DIAGNOSIS — Z7901 Long term (current) use of anticoagulants: Secondary | ICD-10-CM | POA: Insufficient documentation

## 2021-04-14 DIAGNOSIS — I5022 Chronic systolic (congestive) heart failure: Secondary | ICD-10-CM | POA: Insufficient documentation

## 2021-04-14 DIAGNOSIS — F1010 Alcohol abuse, uncomplicated: Secondary | ICD-10-CM | POA: Insufficient documentation

## 2021-04-14 DIAGNOSIS — G4700 Insomnia, unspecified: Secondary | ICD-10-CM | POA: Insufficient documentation

## 2021-04-14 LAB — DIGOXIN LEVEL: Digoxin Level: <0.2 ng/mL — ABNORMAL LOW (ref 0.8–2.0)

## 2021-04-14 LAB — BASIC METABOLIC PANEL
Anion gap: 7 (ref 5–15)
BUN: 7 mg/dL (ref 6–20)
CO2: 31 mmol/L (ref 22–32)
Calcium: 8.6 mg/dL — ABNORMAL LOW (ref 8.9–10.3)
Chloride: 99 mmol/L (ref 98–111)
Creatinine, Ser: 0.87 mg/dL (ref 0.61–1.24)
GFR, Estimated: >60 mL/min (ref >60)
Glucose, Bld: 151 mg/dL — ABNORMAL HIGH (ref 70–99)
Potassium: 3.9 mmol/L (ref 3.5–5.1)
Sodium: 137 mmol/L (ref 135–145)

## 2021-04-14 MED ORDER — CARVEDILOL 6.25 MG PO TABS
6.2500 mg | ORAL_TABLET | Freq: Two times a day (BID) | ORAL | 1 refills | Status: DC
  Filled 2021-04-14: qty 60, 30d supply, fill #0
  Filled 2021-04-14: qty 180, 90d supply, fill #0
  Filled 2021-05-10: qty 60, 30d supply, fill #0
  Filled 2021-06-13: qty 60, 30d supply, fill #1
  Filled 2021-07-19: qty 60, 30d supply, fill #2
  Filled 2021-08-12: qty 60, 30d supply, fill #3
  Filled 2021-09-09: qty 60, 30d supply, fill #4

## 2021-04-14 NOTE — Patient Instructions (Addendum)
INCREASE Carvedilol to 6.25mg  (1 tab) twice a day  Please follow up at urgent care for your ear and respiratory concerns.   Labs today We will only contact you if something comes back abnormal or we need to make some changes. Otherwise no news is good news!  Your physician has requested that you have an echocardiogram. Echocardiography is a painless test that uses sound waves to create images of your heart. It provides your doctor with information about the size and shape of your heart and how well your heart's chambers and valves are working. This procedure takes approximately one hour. There are no restrictions for this procedure.  Your physician recommends that you schedule a follow-up appointment in: 4 month with Dr Shirlee Latch   Please call office at [phone] option 2 if you have any questions or concerns.   At the [facility], you and your health needs are our priority. As part of our continuing mission to provide you with exceptional heart care, we have created designated Provider Care Teams. These Care Teams include your primary Cardiologist (physician) and Advanced Practice Providers (APPs- Physician Assistants and Nurse Practitioners) who all work together to provide you with the care you need, when you need it.   You may see any of the following providers on your designated Care Team at your next follow up: Dr Arvilla Meres Dr Carron Curie, NP Robbie Lis, [place] [facility] [place], [place] Karle Plumber, PharmD   Please be sure to bring in all your medications bottles to every appointment.    Marland Kitchen

## 2021-04-15 ENCOUNTER — Telehealth: Payer: Self-pay | Admitting: Family Medicine

## 2021-04-15 NOTE — Telephone Encounter (Signed)
Checking percert on the following patient for testing scheduled at [place].     ECHO   05/31/2021  

## 2021-04-19 ENCOUNTER — Other Ambulatory Visit (HOSPITAL_COMMUNITY): Payer: Self-pay

## 2021-04-22 ENCOUNTER — Other Ambulatory Visit (HOSPITAL_COMMUNITY): Payer: Self-pay

## 2021-04-25 ENCOUNTER — Ambulatory Visit (HOSPITAL_COMMUNITY)
Admission: RE | Admit: 2021-04-25 | Discharge: 2021-04-25 | Disposition: A | Discharge status: Home / Self Care | Payer: Self-pay | Source: Ambulatory Visit | Attending: Physician Assistant | Admitting: Physician Assistant

## 2021-04-25 DIAGNOSIS — I77810 Thoracic aortic ectasia: Secondary | ICD-10-CM | POA: Insufficient documentation

## 2021-04-25 DIAGNOSIS — I2510 Atherosclerotic heart disease of native coronary artery without angina pectoris: Secondary | ICD-10-CM | POA: Insufficient documentation

## 2021-04-25 DIAGNOSIS — Z87891 Personal history of nicotine dependence: Secondary | ICD-10-CM | POA: Insufficient documentation

## 2021-04-25 DIAGNOSIS — Z8249 Family history of ischemic heart disease and other diseases of the circulatory system: Secondary | ICD-10-CM | POA: Insufficient documentation

## 2021-04-25 DIAGNOSIS — I5022 Chronic systolic (congestive) heart failure: Secondary | ICD-10-CM | POA: Insufficient documentation

## 2021-04-26 LAB — ECHOCARDIOGRAM COMPLETE
Area-P 1/2: 7.59 cm2
S' Lateral: 5.7 cm

## 2021-04-27 ENCOUNTER — Telehealth (HOSPITAL_COMMUNITY): Payer: Self-pay

## 2021-04-27 ENCOUNTER — Telehealth (HOSPITAL_COMMUNITY): Payer: Self-pay | Admitting: Vascular Surgery

## 2021-04-27 DIAGNOSIS — I5022 Chronic systolic (congestive) heart failure: Secondary | ICD-10-CM

## 2021-04-27 NOTE — Telephone Encounter (Signed)
-----   Message from Andrey Farmer, [place] sent at 04/26/2021  4:38 PM EST ----- Reviewed with Dr. Shirlee Latch. Please refer to EP to discuss ICD since EF (heart squeezing function) still low at 25-30%.   Also needs limited echo (ordered with contrast) to look for left ventricular thrombus. Unable to rule this out on today's study.

## 2021-04-27 NOTE — Telephone Encounter (Signed)
Left pt message giving echo appt  12/7 @ 11 am, asked pt to call back to confirm appt

## 2021-04-27 NOTE — Telephone Encounter (Signed)
Pt. Aware and agreeable. Ref placed to ep. Limited echo order placed.

## 2021-05-10 ENCOUNTER — Other Ambulatory Visit (HOSPITAL_COMMUNITY): Payer: Self-pay

## 2021-05-11 ENCOUNTER — Ambulatory Visit (HOSPITAL_COMMUNITY): Payer: Self-pay

## 2021-05-31 ENCOUNTER — Other Ambulatory Visit (HOSPITAL_COMMUNITY): Payer: Medicaid Other

## 2021-06-03 ENCOUNTER — Institutional Professional Consult (permissible substitution): Payer: Medicaid Other | Admitting: Internal Medicine

## 2021-06-13 ENCOUNTER — Other Ambulatory Visit (HOSPITAL_COMMUNITY): Payer: Self-pay | Admitting: Cardiology

## 2021-06-13 ENCOUNTER — Other Ambulatory Visit (HOSPITAL_COMMUNITY): Payer: Self-pay

## 2021-06-13 MED ORDER — SPIRONOLACTONE 25 MG PO TABS
25.0000 mg | ORAL_TABLET | Freq: Every day | ORAL | 3 refills | Status: DC
  Filled 2021-06-13: qty 30, 30d supply, fill #0
  Filled 2021-07-19: qty 30, 30d supply, fill #1

## 2021-06-13 MED ORDER — LOSARTAN POTASSIUM 25 MG PO TABS
25.0000 mg | ORAL_TABLET | Freq: Every day | ORAL | 5 refills | Status: DC
  Filled 2021-06-13: qty 30, 30d supply, fill #0

## 2021-06-13 MED ORDER — ATORVASTATIN CALCIUM 40 MG PO TABS
40.0000 mg | ORAL_TABLET | Freq: Every day | ORAL | 3 refills | Status: DC
  Filled 2021-06-13: qty 30, 30d supply, fill #0
  Filled 2021-07-19: qty 30, 30d supply, fill #1
  Filled 2021-08-12: qty 30, 30d supply, fill #2
  Filled 2021-09-09: qty 30, 30d supply, fill #3

## 2021-06-14 ENCOUNTER — Other Ambulatory Visit (HOSPITAL_COMMUNITY): Payer: Self-pay

## 2021-06-14 NOTE — Addendum Note (Signed)
Addended by: Noralee Space on: 06/14/2021 11:41 AM   Modules accepted: Orders

## 2021-06-14 NOTE — Telephone Encounter (Signed)
Per Dr Aundra Dubin this Losartan refill was done in error. Pt is on Entresto and receives it from [facility], per pharmacy review pt has not gotten Losartan since July, med d/c'd from list and pharmacy profile, attempted to call pt to discuss but unable to reach him, Left message to call back

## 2021-06-20 ENCOUNTER — Ambulatory Visit (HOSPITAL_COMMUNITY): Admission: RE | Admit: 2021-06-20 | Payer: Self-pay | Source: Ambulatory Visit

## 2021-06-20 ENCOUNTER — Encounter: Payer: Self-pay | Admitting: Internal Medicine

## 2021-07-19 ENCOUNTER — Other Ambulatory Visit (HOSPITAL_COMMUNITY): Payer: Self-pay

## 2021-08-01 ENCOUNTER — Other Ambulatory Visit: Payer: Self-pay

## 2021-08-01 ENCOUNTER — Ambulatory Visit (HOSPITAL_COMMUNITY)
Admission: RE | Admit: 2021-08-01 | Discharge: 2021-08-01 | Disposition: A | Discharge status: Home / Self Care | Payer: Self-pay | Source: Ambulatory Visit | Attending: Cardiology | Admitting: Cardiology

## 2021-08-01 DIAGNOSIS — I255 Ischemic cardiomyopathy: Secondary | ICD-10-CM | POA: Insufficient documentation

## 2021-08-01 DIAGNOSIS — I340 Nonrheumatic mitral (valve) insufficiency: Secondary | ICD-10-CM | POA: Insufficient documentation

## 2021-08-01 DIAGNOSIS — I2510 Atherosclerotic heart disease of native coronary artery without angina pectoris: Secondary | ICD-10-CM | POA: Insufficient documentation

## 2021-08-01 DIAGNOSIS — F17210 Nicotine dependence, cigarettes, uncomplicated: Secondary | ICD-10-CM | POA: Insufficient documentation

## 2021-08-01 DIAGNOSIS — I5022 Chronic systolic (congestive) heart failure: Secondary | ICD-10-CM | POA: Insufficient documentation

## 2021-08-01 NOTE — Progress Notes (Signed)
°  Echocardiogram 2D Echocardiogram has been performed.  Leta Jungling M 08/01/2021, 3:15 PM

## 2021-08-02 LAB — ECHOCARDIOGRAM LIMITED
Area-P 1/2: 3.21 cm2
S' Lateral: 5.7 cm

## 2021-08-12 ENCOUNTER — Other Ambulatory Visit: Payer: Self-pay

## 2021-08-12 ENCOUNTER — Encounter (HOSPITAL_COMMUNITY): Payer: Self-pay | Admitting: Cardiology

## 2021-08-12 ENCOUNTER — Ambulatory Visit (HOSPITAL_COMMUNITY)
Admission: RE | Admit: 2021-08-12 | Discharge: 2021-08-12 | Disposition: A | Discharge status: Home / Self Care | Payer: Medicaid Other | Source: Ambulatory Visit | Attending: Cardiology | Admitting: Cardiology

## 2021-08-12 ENCOUNTER — Other Ambulatory Visit (HOSPITAL_COMMUNITY): Payer: Self-pay

## 2021-08-12 VITALS — BP 124/70 | HR 84 | Wt 213.4 lb

## 2021-08-12 DIAGNOSIS — Z87891 Personal history of nicotine dependence: Secondary | ICD-10-CM | POA: Insufficient documentation

## 2021-08-12 DIAGNOSIS — N62 Hypertrophy of breast: Secondary | ICD-10-CM | POA: Insufficient documentation

## 2021-08-12 DIAGNOSIS — I255 Ischemic cardiomyopathy: Secondary | ICD-10-CM | POA: Insufficient documentation

## 2021-08-12 DIAGNOSIS — Z79899 Other long term (current) drug therapy: Secondary | ICD-10-CM | POA: Insufficient documentation

## 2021-08-12 DIAGNOSIS — I2510 Atherosclerotic heart disease of native coronary artery without angina pectoris: Secondary | ICD-10-CM | POA: Insufficient documentation

## 2021-08-12 DIAGNOSIS — I5022 Chronic systolic (congestive) heart failure: Secondary | ICD-10-CM

## 2021-08-12 DIAGNOSIS — N644 Mastodynia: Secondary | ICD-10-CM | POA: Insufficient documentation

## 2021-08-12 DIAGNOSIS — F1290 Cannabis use, unspecified, uncomplicated: Secondary | ICD-10-CM | POA: Insufficient documentation

## 2021-08-12 DIAGNOSIS — M1990 Unspecified osteoarthritis, unspecified site: Secondary | ICD-10-CM | POA: Insufficient documentation

## 2021-08-12 DIAGNOSIS — F1411 Cocaine abuse, in remission: Secondary | ICD-10-CM | POA: Insufficient documentation

## 2021-08-12 DIAGNOSIS — Z7984 Long term (current) use of oral hypoglycemic drugs: Secondary | ICD-10-CM | POA: Insufficient documentation

## 2021-08-12 DIAGNOSIS — F1011 Alcohol abuse, in remission: Secondary | ICD-10-CM | POA: Insufficient documentation

## 2021-08-12 DIAGNOSIS — Z7982 Long term (current) use of aspirin: Secondary | ICD-10-CM | POA: Insufficient documentation

## 2021-08-12 LAB — DIGOXIN LEVEL: Digoxin Level: <0.2 ng/mL — ABNORMAL LOW (ref 0.8–2.0)

## 2021-08-12 LAB — BASIC METABOLIC PANEL
Anion gap: 10 (ref 5–15)
BUN: 11 mg/dL (ref 6–20)
CO2: 28 mmol/L (ref 22–32)
Calcium: 9.2 mg/dL (ref 8.9–10.3)
Chloride: 101 mmol/L (ref 98–111)
Creatinine, Ser: 0.99 mg/dL (ref 0.61–1.24)
GFR, Estimated: >60 mL/min (ref >60)
Glucose, Bld: 185 mg/dL — ABNORMAL HIGH (ref 70–99)
Potassium: 4.2 mmol/L (ref 3.5–5.1)
Sodium: 139 mmol/L (ref 135–145)

## 2021-08-12 MED ORDER — ENTRESTO 49-51 MG PO TABS
1.0000 | ORAL_TABLET | Freq: Two times a day (BID) | ORAL | 11 refills | Status: DC
  Filled 2021-08-12: qty 60, 30d supply, fill #0

## 2021-08-12 MED ORDER — EPLERENONE 50 MG PO TABS
50.0000 mg | ORAL_TABLET | Freq: Every day | ORAL | 11 refills | Status: DC
Start: 2021-08-12 — End: 2022-08-22
  Filled 2021-08-12 (×2): qty 30, 30d supply, fill #0
  Filled 2021-09-09: qty 30, 30d supply, fill #1
  Filled 2021-10-14: qty 30, 30d supply, fill #2
  Filled 2021-11-18: qty 30, 30d supply, fill #3
  Filled 2021-12-26: qty 30, 30d supply, fill #4
  Filled 2022-01-25: qty 30, 30d supply, fill #5
  Filled 2022-02-27: qty 30, 30d supply, fill #6
  Filled 2022-04-03: qty 30, 30d supply, fill #7
  Filled 2022-05-01: qty 30, 30d supply, fill #8
  Filled 2022-06-13: qty 30, 30d supply, fill #9
  Filled 2022-07-17: qty 30, 30d supply, fill #10

## 2021-08-12 NOTE — Patient Instructions (Signed)
Medication Changes: ? ?Stop Spironolactone. After 3 days start Eplerenone ? ?Increase Entresto to 49/51 ? ?Lab Work: ? ?Labs done today, your results will be available in MyChart, we will contact you for abnormal readings. ? ? ?Testing/Procedures: ? ?Repeat blood work in 10-14 days ? ?Referrals: ? ?You have been referred to Electrophysiologist ? ?Please follow up with our heart failure pharmacist in 1 month ? ? ?Special Instructions // Education: ? ?none ? ?Follow-Up in: 2 months ? ?At the [facility], you and your health needs are our priority. We have a designated team specialized in the treatment of Heart Failure. This Care Team includes your primary Heart Failure Specialized Cardiologist (physician), Advanced Practice Providers (APPs- Physician Assistants and Nurse Practitioners), and Pharmacist who all work together to provide you with the care you need, when you need it.  ? ?You may see any of the following providers on your designated Care Team at your next follow up: ? ?Dr Arvilla Meres ?Dr Marca Ancona ?[name], NP ?Robbie Lis, PA ?Jessica Milford,NP ?Anna Genre, PA ?Karle Plumber, PharmD ? ? ?Please be sure to bring in all your medications bottles to every appointment.  ? ?Need to Contact us: ? ?If you have any questions or concerns before your next appointment please send [place] a message through [place] or call our office at [phone].   ? ?TO LEAVE A MESSAGE FOR THE NURSE SELECT OPTION 2, PLEASE LEAVE A MESSAGE INCLUDING: ?YOUR NAME ?DATE OF BIRTH ?CALL BACK NUMBER ?REASON FOR CALL**this is important as we prioritize the call backs ? ?YOU WILL RECEIVE A CALL BACK THE SAME DAY AS LONG AS YOU CALL BEFORE 4:00 PM ? ? ?

## 2021-08-13 NOTE — Progress Notes (Signed)
PCP: Pcp, No ?Cardiology: Dr. Aundra Dubin ? ?Kyle Peck is a 61 y.o. with a history of arthritis, ETOH abuse, tobacco abuse, THC use, and cocaine abuse.  Presented to [facility] on 10/21/20 with progressive shortness of breath and fatigue. CXR with pulmonary edema. BNP 2252. Echo showed severely reduced EF 15% and mildly reduced RV. IV Lasix started.  He developed lethargy with respiratory failure, suspect ETOH withdrawal. Required intubation 10/24/20. Became hypotensive requiring norepinephrine and vasopressin, he developed AKI and had CVVH transiently. He was found to have MRSA bacteremia, possibly from PNA.  TEE showed no endocarditis.  Eventually he had right and left heart cath, showing severe 3 vessel disease.  We recommended CABG but wanted MRI viability study first.  Patient decided to leave AMA.  He did get his medications prior to discharge and consented to wear a Lifevest.   ? ?He subsequently saw Dr. Cyndia Bent in the office, was not thought to have good targets for CABG.  Given lack of chest pain, CABG was not recommended.  ? ?Echo was done today and reviewed, EF 25-30%, severe LV dilation with wall motion abnormalities, normal RV, mild Kyle.  ? ?He returns today for followup of CHF.  He has not drunk ETOH, smoked cigarettes, or used cocaine since discharge from the hospital.  No chest pain.  He has gained weight but says he has been eating a lot and does not feel like it is fluid weight. No exertional dyspnea.  He is taking Lasix about every other day.  He has developed painful gynecomastia, likely due to spironolactone.  No orthopnea/PND.  No lightheadedness.   ? ?Labs (6/22): K 4, creatinine 0.73 ?Labs (11/22): K 3.9, creatinine 0.87, digoxin < 0.2 ? ?PMH: ?1. ETOH abuse.  ?2. Smoking ?3. Prior cocaine use.  ?4. MRSA bacteremia 5/22 ?- TEE (5/22): no endocarditis.  ?5. CAD: LHC (5/22) with totally occluded pLAD with collaterals, totally occluded ramus with collaterals, 70% proximal RCA, PDA totally occluded with  collaterals. CABG recommended.  ?6. Chronic systolic CHF: Ischemic cardiomyopathy.  Echo (5/22) with EF 15%, moderate LV enlargement, moderate RV enlargement with mildly decreased RV systolic function, mild Kyle.  ?- RHC (5/22): mean RA 3, PA 26/4, mean PCWP 5, CI 2.76 Fick, CI 3.01 thermo.  ?- Echo (3/23): EF 25-30%, severe LV dilation with wall motion abnormalities, normal RV, mild Kyle.  ? ?Social History  ? ?Socioeconomic History  ? Marital status: Married  ?  Spouse name: [name]  ? Number of children: Not on file  ? Years of education: Not on file  ? Highest education level: Not on file  ?Occupational History  ? Not on file  ?Tobacco Use  ? Smoking status: Former  ?  Packs/day: 0.50  ?  Types: Cigars, Cigarettes  ? Smokeless tobacco: Never  ?Vaping Use  ? Vaping Use: Never used  ?Substance and Sexual Activity  ? Alcohol use: Yes  ?  Alcohol/week: 15.0 standard drinks  ?  Types: 15 Shots of liquor per week  ?  Comment: intermittently  ? Drug use: Yes  ?  Frequency: 2.0 times per week  ?  Types: Marijuana  ?  Comment: "also around cocaine before"  ? Sexual activity: Not on file  ?Other Topics Concern  ? Not on file  ?Social History Narrative  ? Not on file  ? ?Social Determinants of Health  ? ?Financial Resource Strain: High Risk  ? Difficulty of Paying Living Expenses: Hard  ?Food Insecurity: Food Insecurity Present  ?  Worried About Charity fundraiser in the Last Year: Never true  ? Ran Out of Food in the Last Year: Sometimes true  ?Transportation Needs: Unmet Transportation Needs  ? Lack of Transportation (Medical): Yes  ? Lack of Transportation (Non-Medical): No  ?Physical Activity: Not on file  ?Stress: Not on file  ?Social Connections: Not on file  ?Intimate Partner Violence: Not on file  ? ?Family History  ?Problem Relation Age of Onset  ? Congestive Heart Failure Mother   ? Congestive Heart Failure Father   ? ?ROS: All systems reviewed and negative except as per HPI.  ? ?Current Outpatient Medications   ?Medication Sig Dispense Refill  ? aspirin 81 MG chewable tablet Chew 1 tablet (81 mg total) by mouth daily. 30 tablet 11  ? atorvastatin (LIPITOR) 40 MG tablet Take 1 tablet (40 mg total) by mouth daily. 30 tablet 3  ? carvedilol (COREG) 6.25 MG tablet Take 1 tablet (6.25 mg total) by mouth 2 (two) times daily. 180 tablet 1  ? dapagliflozin propanediol (FARXIGA) 10 MG TABS tablet Take 1 tablet (10 mg total) by mouth daily before breakfast. 30 tablet 3  ? digoxin (LANOXIN) 0.125 MG tablet Take 0.5 tablets (0.0625 mg total) by mouth daily. 15 tablet 11  ? eplerenone (INSPRA) 50 MG tablet Take 1 tablet (50 mg total) by mouth daily. 30 tablet 11  ? Famotidine (PEPCID PO) Take 1 tablet by mouth daily as needed (indigestion).    ? furosemide (LASIX) 20 MG tablet Take 1 tablet (20 mg total) by mouth daily as needed. 30 tablet 6  ? sacubitril-valsartan (ENTRESTO) 49-51 MG Take 1 tablet by mouth 2 (two) times daily. 60 tablet 11  ? traZODone (DESYREL) 100 MG tablet Take 1 tablet (100 mg total) by mouth at bedtime as needed for sleep. 30 tablet 3  ? ?No current facility-administered medications for this encounter.  ? ?BP 124/70   Pulse 84   Wt 96.8 kg (213 lb 6.4 oz)   SpO2 100%   BMI 31.51 kg/m?  ?General: NAD ?Neck: No JVD, no thyromegaly or thyroid nodule.  ?Lungs: Clear to auscultation bilaterally with normal respiratory effort. ?CV: Nondisplaced PMI.  Heart regular S1/S2, no S3/S4, no murmur.  No peripheral edema.  No carotid bruit.  Normal pedal pulses.  ?Abdomen: Soft, nontender, no hepatosplenomegaly, no distention.  ?Skin: Intact without lesions or rashes.  ?Neurologic: Alert and oriented x 3.  ?Psych: Normal affect. ?Extremities: No clubbing or cyanosis.  ?HEENT: Normal.  ? ?Assessment/Plan: ?1. 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  Echo in 5/22 with EF 15%, moderate LV dilation, mildly decreased RV function, mild Kyle. Ischemic cardiomyopathy based on 6/22 cath. [place]  in 5/22 showed normal cardiac index.  Echo today with EF 25-30%, severe LV dilation with wall motion abnormalities, normal RV, mild Kyle.  On exam today, he is not significantly volume overloaded.  NYHA class I-II symptoms.  ?- Continue digoxin, check level.  ?- Increase Entresto to 49/51 bid, BMET today and in 10 days.  I recommended that he cut back on prn Lasix use with increase in Entresto.   ?- Continue Coreg 6.25 mg bid.  ?- Painful gynecomastia likely due to spironolactone.  Stop spironolactone, in 3-4 days start eplerenone 50 mg daily.  ?- Continue Farxiga 10 mg daily.  ?- I recommended ICD.  Not CRT candidate with narrow QRS.  I referred him to EP.  ?- Needs to stay off ETOH.  ?2. ETOH abuse: He  has not had anything to drink since leaving the hospital. This may have contributed to his cardiomyopathy.  ?- I urged that he remain abstinent.  ?3. Cocaine abuse: This was rare and he has not used since discharge.  ?4. MRSA bacteremia: ?PNA source.  TEE 5/22 showed no endocarditis. ?5 CAD: Cath 6/22 with occluded ostial PDA, mid ramus, and proximal LAD, all with collaterals.  No chest pain, presentation in 5/22 with CHF.  No recent chest pain.  ?- Continue ASA 81 and statin.  ?- He saw Dr. Cyndia Bent.  Given lack of chest pain and poor targets for CABG, CABG was not recommended.  Continue medical management.  ? ?Followup 1 month with HF pharmacist for medication titration, see APP in 2 months.  ? ?Loralie Champagne ?08/13/2021 ? ? ?

## 2021-08-30 ENCOUNTER — Other Ambulatory Visit (HOSPITAL_COMMUNITY): Payer: Self-pay

## 2021-09-01 ENCOUNTER — Telehealth (HOSPITAL_COMMUNITY): Payer: Self-pay | Admitting: Pharmacy Technician

## 2021-09-01 NOTE — Telephone Encounter (Signed)
Advanced Heart Failure Patient Advocate Encounter ? ?Received a assistance renewal application from [employer] for [place]. The patient is currently uninsured. Can use the HF fund to fill [place]. He currently fills other hf fund medications there. [facility]  ? ?Called and left the patient a message regarding this change. ? ?Archer Asa, CPhT ? ?

## 2021-09-09 ENCOUNTER — Other Ambulatory Visit (HOSPITAL_COMMUNITY): Payer: Self-pay

## 2021-09-09 ENCOUNTER — Other Ambulatory Visit (HOSPITAL_COMMUNITY): Payer: Self-pay | Admitting: Cardiology

## 2021-09-09 MED ORDER — ENTRESTO 49-51 MG PO TABS: 1.0000 | ORAL_TABLET | Freq: Two times a day (BID) | ORAL | 3 refills | Status: DC

## 2021-09-09 NOTE — Progress Notes (Incomplete)
***In Progress*** ? ?  ?Advanced Heart Failure Clinic Note  ?PCP: Pcp, No ?Cardiology: Dr. Aundra Dubin ? ?HPI:  ?Kyle Peck is a 61 y.o. with a history of arthritis, ETOH abuse, tobacco abuse, THC use, and cocaine abuse.  Presented to [facility] on 10/21/20 with progressive shortness of breath and fatigue. CXR with pulmonary edema. BNP 2252. Echo showed severely reduced EF 15% and mildly reduced RV. IV Lasix started.  He developed lethargy with respiratory failure, suspect ETOH withdrawal. Required intubation 10/24/20. Became hypotensive requiring norepinephrine and vasopressin, he developed AKI and had CVVH transiently. He was found to have MRSA bacteremia, possibly from PNA.  TEE showed no endocarditis.  Eventually he had right and left heart cath, showing severe 3 vessel disease. We recommended CABG but wanted MRI viability study first.  Patient decided to leave AMA.  He did get his medications prior to discharge and consented to wear a Lifevest.   ?  ?He subsequently saw Dr. Cyndia Bent in the office, was not thought to have good targets for CABG.  Given lack of chest pain, CABG was not recommended.  ?  ?Echo 08/12/21 EF 25-30%, severe LV dilation with wall motion abnormalities, normal RV, mild Kyle.  ?  ?He returned 08/12/21 for followup of CHF.  He had not drunk ETOH, smoked cigarettes, or used cocaine since discharge from the hospital.  No chest pain.  He had gained weight but said he had been eating a lot and did not feel like it is fluid weight. No exertional dyspnea.  He reported taking Lasix about every other day.  He developed painful gynecomastia, likely due to spironolactone.  No orthopnea/PND.  No lightheadedness.   ?  ?Today he returns to HF clinic for pharmacist medication titration. At last visit with MD Delene Loll was increased to 49/51 mg BID, spironolactone was discontinued, and eplerenone 50 mg daily was started.  ? ?Shortness of breath/dyspnea on exertion? {YES NO:22349}  ?Orthopnea/PND? {YES NO:22349} ?Edema? {YES  NO:22349} ?Lightheadedness/dizziness? {YES NO:22349} ?Daily weights at home? {YES NO:22349} ?Blood pressure/heart rate monitoring at home? {YES NO:22349} ?Following low-sodium/fluid-restricted diet? {YES NO:22349} ? ?HF Medications: ? ? ?Has the patient been experiencing any side effects to the medications prescribed?  {YES NO:22349} ? ?Does the patient have any problems obtaining medications due to transportation or finances?   {YES NO:22349} ? ?Understanding of regimen: {excellent/good/fair/poor:19665} ?Understanding of indications: {excellent/good/fair/poor:19665} ?Potential of compliance: {excellent/good/fair/poor:19665} ?Patient understands to avoid NSAIDs. ?Patient understands to avoid decongestants. ?  ? ?Pertinent Lab Values: ?08/12/21 Serum creatinine 0.99, BUN 11, Potassium 4.2, Sodium 139, Digoxin <0.2  ? ?Vital Signs: ?Weight: *** (last clinic weight: 213 lbs) ?Blood pressure: ***  ?Heart rate: ***  ? ?Assessment/Plan: ?1. 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  Echo in 10/2020 with EF 15%, moderate LV dilation, mildly decreased RV function, mild Kyle. Ischemic cardiomyopathy based on 11/2020 cath. [place] in 10/2020 showed normal cardiac index.  Echo today with EF 25-30%, severe LV dilation with wall motion abnormalities, normal RV, mild Kyle.  On exam today, he is not significantly volume overloaded.  NYHA class I-II symptoms.  ?- Continue digoxin ?- Continue Entresto 49/51 mg bid ?- Continue carvedilol 6.25 mg bid.  ?- Continue eplerenone 50 mg daily.  ?- Continue Farxiga 10 mg daily.  ?- Needs to stay off ETOH.  ? ?2. ETOH abuse: He has not had anything to drink since leaving the hospital. This may have contributed to his cardiomyopathy.  ?- I urged that he remain abstinent.  ? ?  3. Cocaine abuse: This was rare and he has not used since discharge.  ? ?4. MRSA bacteremia: ?PNA source.  TEE 10/2020 showed no endocarditis. ? ?5 CAD: Cath 11/2020 with occluded ostial PDA, mid  ramus, and proximal LAD, all with collaterals.  No chest pain, presentation in 10/2020 with CHF.  No recent chest pain.  ?- Continue ASA 81 and statin.  ?- He saw Dr. Cyndia Bent.  Given lack of chest pain and poor targets for CABG, CABG was not recommended.  Continue medical management.  ? ?Follow up *** ? ? ?Audry Riles, PharmD, BCPS, BCCP, CPP ?Heart Failure Clinic Pharmacist ?[phone] ? ? ?

## 2021-09-12 ENCOUNTER — Other Ambulatory Visit (HOSPITAL_COMMUNITY): Payer: Self-pay

## 2021-09-12 ENCOUNTER — Inpatient Hospital Stay (HOSPITAL_COMMUNITY): Admission: RE | Admit: 2021-09-12 | Payer: Medicaid Other | Source: Ambulatory Visit

## 2021-09-22 ENCOUNTER — Institutional Professional Consult (permissible substitution): Payer: Medicaid Other | Admitting: Internal Medicine

## 2021-10-01 NOTE — Progress Notes (Incomplete)
***In Progress*** ? ?  ?Advanced Heart Failure Clinic Note  ? ?PCP: Pcp, No ?Cardiology: Dr. Shirlee Latch ? ?HPI:  ?Kyle Peck is a 61 y.o. with a history of arthritis, ETOH abuse, tobacco abuse, THC use, and cocaine abuse.  He presented to [facility] on 10/21/2020 with progressive shortness of breath and fatigue. CXR with pulmonary edema. BNP 2252. Echo showed severely reduced EF 15% and mildly reduced RV. IV Lasix started.  He developed lethargy with respiratory failure, suspected ETOH withdrawal and required intubation on 10/24/2020. He became hypotensive requiring norepinephrine and vasopressin, developed an AKI and had CVVH transiently. He was found to have MRSA bacteremia, possibly from PNA. TEE showed no endocarditis.  Eventually he had right and left heart cath, showing severe 3 vessel disease. The team recommended CABG but wanted an MRI viability study first, however the patient decided to leave AMA.  He did get his medications prior to discharge and consented to wear a Lifevest.   ?  ?He subsequently saw Dr. Laneta Simmers in the office, was not thought to have good targets for CABG. Given lack of chest pain, CABG was not recommended.  ?  ?He returned for follow-up of CHF. Echo on 09/01/2021 showed EF 25-30%, severe LV dilation with wall motion abnormalities, normal RV, and mild Kyle.  He reported abstaining from alcohol, cigarettes, and cocaine since discharge from the hospital. He denied chest pain and exertional dyspnea. He had gained weight but said he has been eating a lot and did not feel like it was fluid weight. He was taking furosemide 20 mg about every other day.  He had developed painful gynecomastia, likely due to spironolactone. He denied orthopnea, PND or lightheadedness.   ? ?Today he returns to HF clinic for pharmacist medication titration. At last visit with MD Sherryll Burger was increased from 24/26 mg BID to 49/51 mg BID and spironolactone was switched to eplerenone 50 mg daily due to gynecomastia.  ? ?Overall feeling  ***. ?Dizziness, lightheadedness, fatigue:  ?Chest pain or palpitations: ? ?How is your breathing?: *** ?SOB: ?Able to complete all ADLs. Activity level *** ? ?Weight at home pounds. Takes furosemide *** mg *** daily.  ?LEE ?PND/Orthopnea ? ?Appetite *** ?Low-salt diet:  ? ?Physical Exam ?Cost/affordability of meds  ? ?HF Medications: ?Carvedilol 6.25 mg BID ?Entresto 49/51 mg BID ?Eplerenone 50 mg daily ?Farxiga 10 mg daily ?Digoxin 0.0625 mg daily ?Furosemide 20 mg daily PRN ? ?Has the patient been experiencing any side effects to the medications prescribed?  {YES NO:22349} ? ?Does the patient have any problems obtaining medications due to transportation or finances?  ?Patient previously had patient assistance for Farxiga from [employer], however will now fill medication through HF fund ? ?Understanding of regimen: {excellent/good/fair/poor:19665} ?Understanding of indications: {excellent/good/fair/poor:19665} ?Potential of compliance: {excellent/good/fair/poor:19665} ?Patient understands to avoid NSAIDs. ?Patient understands to avoid decongestants. ? ?Pertinent Lab Values: ?Labs 08/12/2021: Serum creatinine 0.99, BUN 11, Potassium 4.2, Sodium 139, Digoxin <0.2  ? ?Vital Signs: ?Weight: *** (last clinic weight: 213.4 lbs) ?Blood pressure: *** 124/70 ?Heart rate: *** 84 ?Plan ?BMET today since entresto increase and spiro to ep switch ?A. Increase carvediol to 12.5 mg bid if vol ok ?B. Increase entresto to 97/103, but would prefer to wait till labs result and can change with APP apt ? ?Assessment/Plan: ?1. 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  Echo in 10/2020 with EF 15%, moderate LV dilation, mildly decreased RV function, mild Kyle. Ischemic cardiomyopathy based on 11/2020 cath. RHC in 10/2020 showed normal cardiac  index.  Echo today with EF 25-30%, severe LV dilation with wall motion abnormalities, normal RV, mild Kyle. ?On exam today, he is not significantly volume overloaded.  NYHA  class I-II symptoms. *** ?- Continue furosemide 20 mg daily PRN.   ?- Continue Coreg 6.25 mg BID.  ?- Continue Entresto 49/51 mg BID ?- Continue eplerenone 50 mg daily.  ?- Continue Farxiga 10 mg daily.  ?- Continue digoxin 0.0625 mg daily. ?- ICD previously recommended and referred to EP.  Not a CRT candidate with narrow QRS  ?- Encouraged abstinence from alcohol.  ?2. ETOH abuse: He has not had anything to drink since leaving the hospital. This may have contributed to his cardiomyopathy.  ?- I urged that he remain abstinent.  ?3. Cocaine abuse: This was rare and he has not used since discharge.  ?4. MRSA bacteremia: ?PNA source.  TEE 10/2020 showed no endocarditis. ?5 CAD: Cath 11/2020 with occluded ostial PDA, mid ramus, and proximal LAD, all with collaterals.  No chest pain, presentation in 10/2020 with CHF.  No recent chest pain.  ?- Continue ASA 81 and statin.  ?- He saw Dr. Laneta Simmers.  Given lack of chest pain and poor targets for CABG, CABG was not recommended.  Continue medical management.  ? ?Follow up 10/12/2021 with APP.  ? ?Karle Plumber, PharmD, BCPS, BCCP, CPP ?Heart Failure Clinic Pharmacist ?[phone] ?  ?

## 2021-10-03 ENCOUNTER — Other Ambulatory Visit (HOSPITAL_COMMUNITY): Payer: Self-pay

## 2021-10-03 ENCOUNTER — Ambulatory Visit (HOSPITAL_COMMUNITY)
Admission: RE | Admit: 2021-10-03 | Discharge: 2021-10-03 | Disposition: A | Discharge status: Home / Self Care | Payer: Self-pay | Source: Ambulatory Visit | Attending: Cardiology | Admitting: Cardiology

## 2021-10-03 ENCOUNTER — Encounter (HOSPITAL_COMMUNITY): Payer: Self-pay

## 2021-10-03 VITALS — BP 126/78 | HR 97 | Wt 212.0 lb

## 2021-10-03 DIAGNOSIS — M1990 Unspecified osteoarthritis, unspecified site: Secondary | ICD-10-CM | POA: Insufficient documentation

## 2021-10-03 DIAGNOSIS — R339 Retention of urine, unspecified: Secondary | ICD-10-CM | POA: Insufficient documentation

## 2021-10-03 DIAGNOSIS — I2510 Atherosclerotic heart disease of native coronary artery without angina pectoris: Secondary | ICD-10-CM | POA: Insufficient documentation

## 2021-10-03 DIAGNOSIS — I255 Ischemic cardiomyopathy: Secondary | ICD-10-CM | POA: Insufficient documentation

## 2021-10-03 DIAGNOSIS — I5022 Chronic systolic (congestive) heart failure: Secondary | ICD-10-CM | POA: Insufficient documentation

## 2021-10-03 DIAGNOSIS — B9562 Methicillin resistant Staphylococcus aureus infection as the cause of diseases classified elsewhere: Secondary | ICD-10-CM | POA: Insufficient documentation

## 2021-10-03 DIAGNOSIS — R7881 Bacteremia: Secondary | ICD-10-CM | POA: Insufficient documentation

## 2021-10-03 LAB — BASIC METABOLIC PANEL
Anion gap: 8 (ref 5–15)
BUN: 16 mg/dL (ref 8–23)
CO2: 27 mmol/L (ref 22–32)
Calcium: 9.3 mg/dL (ref 8.9–10.3)
Chloride: 103 mmol/L (ref 98–111)
Creatinine, Ser: 1 mg/dL (ref 0.61–1.24)
GFR, Estimated: >60 mL/min (ref >60)
Glucose, Bld: 134 mg/dL — ABNORMAL HIGH (ref 70–99)
Potassium: 4 mmol/L (ref 3.5–5.1)
Sodium: 138 mmol/L (ref 135–145)

## 2021-10-03 MED ORDER — TAMSULOSIN HCL 0.4 MG PO CAPS
0.4000 mg | ORAL_CAPSULE | Freq: Every day | ORAL | 0 refills | Status: DC
  Filled 2021-10-03: qty 30, 30d supply, fill #0

## 2021-10-03 MED ORDER — FUROSEMIDE 20 MG PO TABS
20.0000 mg | ORAL_TABLET | Freq: Every day | ORAL | 6 refills | Status: DC | PRN
  Filled 2021-10-03: qty 30, 30d supply, fill #0
  Filled 2021-11-09: qty 30, 30d supply, fill #1
  Filled 2021-12-26: qty 30, 30d supply, fill #2
  Filled 2022-01-25: qty 30, 30d supply, fill #3
  Filled 2022-02-27: qty 30, 30d supply, fill #4
  Filled 2022-04-04: qty 30, 30d supply, fill #5
  Filled 2022-05-01: qty 30, 30d supply, fill #6

## 2021-10-03 MED ORDER — CARVEDILOL 12.5 MG PO TABS
12.5000 mg | ORAL_TABLET | Freq: Two times a day (BID) | ORAL | 3 refills | Status: DC
  Filled 2021-10-03: qty 60, 30d supply, fill #0
  Filled 2021-11-09: qty 60, 30d supply, fill #1
  Filled 2021-12-26: qty 60, 30d supply, fill #2
  Filled 2022-01-25: qty 60, 30d supply, fill #3
  Filled 2022-02-27: qty 60, 30d supply, fill #4
  Filled 2022-04-03: qty 60, 30d supply, fill #5
  Filled 2022-05-01: qty 60, 30d supply, fill #6
  Filled 2022-06-13 (×2): qty 60, 30d supply, fill #7
  Filled 2022-07-17: qty 60, 30d supply, fill #8

## 2021-10-03 MED ORDER — DAPAGLIFLOZIN PROPANEDIOL 10 MG PO TABS
10.0000 mg | ORAL_TABLET | Freq: Every day | ORAL | 3 refills | Status: DC
  Filled 2021-10-03: qty 30, 30d supply, fill #0
  Filled 2021-11-09: qty 30, 30d supply, fill #1
  Filled 2021-12-26: qty 30, 30d supply, fill #2
  Filled 2022-01-25: qty 30, 30d supply, fill #3
  Filled 2022-02-27: qty 30, 30d supply, fill #4
  Filled 2022-04-03: qty 30, 30d supply, fill #5

## 2021-10-03 NOTE — Patient Instructions (Signed)
It was a pleasure seeing you today! ? ?MEDICATIONS: ?-We are changing your medications today ?-Increase carvedilol from 6.25 to 12.5 mg twice daily ?-Call if you have questions about your medications. ? ?LABS: ?-We will call you if your labs need attention. ? ?NEXT APPOINTMENT: ?Return to clinic in 10/12/2021 with NP/PA. ? ?In general, to take care of your heart failure: ?-Limit your fluid intake to 2 Liters (half-gallon) per day.   ?-Limit your salt intake to ideally 2-3 grams (2000-3000 mg) per day. ?-Weigh yourself daily and record, and bring that "weight diary" to your next appointment.  (Weight gain of 2-3 pounds in 1 day typically means fluid weight.) ?-The medications for your heart are to help your heart and help you live longer.   ?-Please contact us before stopping any of your heart medications. ? ?Call the clinic at [phone] with questions or to reschedule future appointments.  ?

## 2021-10-03 NOTE — Progress Notes (Signed)
?  ?Advanced Heart Failure Clinic Note  ? ?PCP: Pcp, No ?Cardiology: Dr. Aundra Dubin ? ?HPI:  ?[name] is a 61 y.o. with a history of arthritis, ETOH abuse, tobacco abuse, THC use, and cocaine abuse.  He presented to [facility] on 10/21/2020 with progressive shortness of breath and fatigue. CXR with pulmonary edema. BNP 2252. Echo showed severely reduced EF 15% and mildly reduced RV. IV Lasix started.  He developed lethargy with respiratory failure, suspected ETOH withdrawal and required intubation on 10/24/2020. He became hypotensive requiring norepinephrine and vasopressin, developed an AKI and had CVVH transiently. He was found to have MRSA bacteremia, possibly from PNA. TEE showed no endocarditis.  Eventually he had right and left heart cath, showing severe 3 vessel disease. The team recommended CABG but wanted an MRI viability study first, however the patient decided to leave AMA.  He did get his medications prior to discharge and consented to wear a Lifevest.   ?  ?He subsequently saw Dr. Cyndia Peck in the office, was not thought to have good targets for CABG. Given lack of chest pain, CABG was not recommended.  ?  ?He returned for follow-up of CHF. Echo on 09/01/2021 showed EF 25-30%, severe LV dilation with wall motion abnormalities, normal RV, and mild MR.  He reported abstaining from alcohol, cigarettes, and cocaine since discharge from the hospital. He denied chest pain and exertional dyspnea. He had gained weight but stated he had been eating a lot and did not feel like it was fluid weight. He was taking furosemide 20 mg about every other day.  He had developed painful gynecomastia, likely due to spironolactone. He denied orthopnea, PND or lightheadedness.   ? ?Today he returns to HF clinic for pharmacist medication titration. At last visit with MD, Delene Loll was increased from 24/26 mg BID to 49/51 mg BID and spironolactone was switched to eplerenone 50 mg daily due to gynecomastia. Overall he is feeling good most of  the time, but states it is hard to compare this to last visit as he is used to "always being on the go." He feels fatigued and that he has slowed down, but also attributes this to his knee pain. He denies dizziness, lightheadedness, chest pain or palpitations. His breathing has been good since hospital discharge and thinks it has improved dramatically since he quit smoking. He is able to complete all ADLs and recently got a stationary bike that he plans to start using to exercise. He hopes to get his stamina back. He does not consistently weigh himself at home. He takes furosemide 20 mg PRN about every other day. No LEE on exam. Denies PND or orthopnea. His appetite has been good. His wife does most of the cooking and she watches the salt content. He has been eating "Smart meals" and avoids adding extra salt to his food.  ? ?HF Medications: ?Carvedilol 6.25 mg BID ?Entresto 49/51 mg BID  ?Eplerenone 50 mg daily ?Farxiga 10 mg daily ?Digoxin 0.0625 mg daily ?Furosemide 20 mg daily PRN ? ?Has the patient been experiencing any side effects to the medications prescribed? No ? ?Does the patient have any problems obtaining medications due to transportation or finances?  ?No insurance. Fills HF medications (including Farxiga) through [facility] [employer]. Maryan Puls through [employer].  ? ?Understanding of regimen: fair ?Understanding of indications: fair ?Potential of compliance: good ?Patient understands to avoid NSAIDs. ?Patient understands to avoid decongestants. ? ?Pertinent Lab Values: ?Labs 08/12/2021: Serum creatinine 0.99, BUN 11, Potassium 4.2,  Sodium 139, Digoxin <0.2  ?Labs 10/03/2021: BMET pending ? ?Vital Signs: ?Weight: 212.0 lbs (last clinic weight: 213.4 lbs) ?Blood pressure: 126/78 mmHg ?Heart rate: 97 bpm ? ?Assessment/Plan: ?1. 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  Echo in 10/2020 with EF 15%, moderate LV dilation, mildly decreased RV function, mild MR.  Ischemic cardiomyopathy based on 11/2020 cath. [place] in 10/2020 showed normal cardiac index.  Echo 08/12/21 with EF 25-30%, severe LV dilation with wall motion abnormalities, normal RV, mild MR. ?On exam today, he is not significantly volume overloaded.  NYHA class II symptoms. ?- Continue furosemide 20 mg daily PRN.   ?- Increase carvedilol to 12.5 mg BID. ?- Continue Entresto 49/51 mg BID ?- Continue eplerenone 50 mg daily.  ?- Continue Farxiga 10 mg daily.  ?- Continue digoxin 0.0625 mg daily. ?- ICD previously recommended and referred to EP.  Not a CRT candidate with narrow QRS  ?- Encouraged abstinence from alcohol.  ?2. ETOH abuse: He has not had anything to drink since leaving the hospital. This may have contributed to his cardiomyopathy.  ?- Encouraged abstinence from alcohol. ?3. Cocaine abuse: This was rare and he has not used since discharge.  ?4. MRSA bacteremia: ?PNA source. TEE 10/2020 showed no endocarditis. ?5 CAD: Cath 11/2020 with occluded ostial PDA, mid ramus, and proximal LAD, all with collaterals.  No chest pain, presentation in 10/2020 with CHF.  No recent chest pain.  ?- Continue ASA 81 and statin.  ?- He saw Dr. Cyndia Peck.  Given lack of chest pain and poor targets for CABG, CABG was not recommended.  Continue medical management.  ?6. Urinary retention ?- Tamsulosin 0.4 mg daily x 30 days given. Further refills per PCP.  ?- Instructed patient he needs to establish care with a PCP. ? ?Follow up 10/12/2021 with APP.  ? ?Audry Riles, PharmD, BCPS, BCCP, CPP ?Heart Failure Clinic Pharmacist ?[phone] ?  ?

## 2021-10-11 ENCOUNTER — Telehealth (HOSPITAL_COMMUNITY): Payer: Self-pay

## 2021-10-11 NOTE — Progress Notes (Signed)
PCP: Pcp, No ?Cardiology: Dr. Aundra Dubin ? ?Mr Kyle Peck is a 61 y.o. with a history of arthritis, ETOH abuse, tobacco abuse, THC use, and cocaine abuse.  Presented to [facility] on 10/21/20 with progressive shortness of breath and fatigue. CXR with pulmonary edema. BNP 2252. Echo showed severely reduced EF 15% and mildly reduced RV. IV Lasix started.  He developed lethargy with respiratory failure, suspect ETOH withdrawal. Required intubation 10/24/20. Became hypotensive requiring norepinephrine and vasopressin, he developed AKI and had CVVH transiently. He was found to have MRSA bacteremia, possibly from PNA.  TEE showed no endocarditis.  Eventually he had right and left heart cath, showing severe 3 vessel disease.  We recommended CABG but wanted MRI viability study first.  Patient decided to leave AMA.  He did get his medications prior to discharge and consented to wear a Lifevest.   ? ?He subsequently saw Dr. Cyndia Bent in the office, was not thought to have good targets for CABG.  Given lack of chest pain, CABG was not recommended.  ? ?Echo 2/23 showed EF 25-30%, severe LV dilation with wall motion abnormalities, normal RV, mild MR. Referred to EP for ICD. ? ?Today he returns for HF follow up. Overall feeling fine. No dyspnea with lifting or walking up inclines, admits mild dyspnea with sexual activity. Denies palpitations, CP, dizziness, edema, or PND/Orthopnea. Appetite ok. No fever or chills. Weight at home 212 pounds. Taking all medications. No further ETOH/tobacco/cocaine use since discharge. Takes lasix every other day. ? ?ECG (personally reviewed): SR 1st AVB PR 246 msec, QRS 103 msec, 71 bpm ? ?Labs (6/22): K 4, creatinine 0.73 ?Labs (11/22): K 3.9, creatinine 0.87, digoxin < 0.2 ?Labs (5/23): K 4.0, creatinine 1.00 ? ?PMH: ?1. ETOH abuse.  ?2. Smoking ?3. Prior cocaine use.  ?4. MRSA bacteremia 5/22 ?- TEE (5/22): no endocarditis.  ?5. CAD: LHC (5/22) with totally occluded pLAD with collaterals, totally occluded ramus  with collaterals, 70% proximal RCA, PDA totally occluded with collaterals. CABG recommended.  ?6. Chronic systolic CHF: Ischemic cardiomyopathy.  Echo (5/22) with EF 15%, moderate LV enlargement, moderate RV enlargement with mildly decreased RV systolic function, mild MR.  ?- RHC (5/22): mean RA 3, PA 26/4, mean PCWP 5, CI 2.76 Fick, CI 3.01 thermo.  ?- Echo (3/23): EF 25-30%, severe LV dilation with wall motion abnormalities, normal RV, mild MR.  ? ?Social History  ? ?Socioeconomic History  ? Marital status: Married  ?  Spouse name: [name]  ? Number of children: Not on file  ? Years of education: Not on file  ? Highest education level: Not on file  ?Occupational History  ? Not on file  ?Tobacco Use  ? Smoking status: Former  ?  Packs/day: 0.50  ?  Types: Cigars, Cigarettes  ? Smokeless tobacco: Never  ?Vaping Use  ? Vaping Use: Never used  ?Substance and Sexual Activity  ? Alcohol use: Yes  ?  Alcohol/week: 15.0 standard drinks  ?  Types: 15 Shots of liquor per week  ?  Comment: intermittently  ? Drug use: Yes  ?  Frequency: 2.0 times per week  ?  Types: Marijuana  ?  Comment: "also around cocaine before"  ? Sexual activity: Not on file  ?Other Topics Concern  ? Not on file  ?Social History Narrative  ? Not on file  ? ?Social Determinants of Health  ? ?Financial Resource Strain: High Risk  ? Difficulty of Paying Living Expenses: Hard  ?Food Insecurity: Food Insecurity Present  ? Worried  About Running Out of Food in the Last Year: Never true  ? Ran Out of Food in the Last Year: Sometimes true  ?Transportation Needs: Unmet Transportation Needs  ? Lack of Transportation (Medical): Yes  ? Lack of Transportation (Non-Medical): No  ?Physical Activity: Not on file  ?Stress: Not on file  ?Social Connections: Not on file  ?Intimate Partner Violence: Not on file  ? ?Family History  ?Problem Relation Age of Onset  ? Congestive Heart Failure Mother   ? Congestive Heart Failure Father   ? ?ROS: All systems reviewed and  negative except as per HPI.  ? ?Current Outpatient Medications  ?Medication Sig Dispense Refill  ? aspirin 81 MG chewable tablet Chew 1 tablet (81 mg total) by mouth daily. 30 tablet 11  ? atorvastatin (LIPITOR) 40 MG tablet Take 1 tablet (40 mg total) by mouth daily. 30 tablet 3  ? carvedilol (COREG) 12.5 MG tablet Take 1 tablet (12.5 mg total) by mouth 2 (two) times daily. 180 tablet 3  ? dapagliflozin propanediol (FARXIGA) 10 MG TABS tablet Take 1 tablet (10 mg total) by mouth daily before breakfast. 90 tablet 3  ? digoxin (LANOXIN) 0.125 MG tablet Take 0.5 tablets (0.0625 mg total) by mouth daily. 15 tablet 11  ? eplerenone (INSPRA) 50 MG tablet Take 1 tablet (50 mg total) by mouth daily. 30 tablet 11  ? Famotidine (PEPCID PO) Take 1 tablet by mouth daily as needed (indigestion).    ? furosemide (LASIX) 20 MG tablet Take 1 tablet (20 mg total) by mouth daily as needed. 30 tablet 6  ? sacubitril-valsartan (ENTRESTO) 49-51 MG Take 1 tablet by mouth 2 (two) times daily. 180 tablet 3  ? tamsulosin (FLOMAX) 0.4 MG CAPS capsule Take 1 capsule (0.4 mg total) by mouth daily after supper. 30 capsule 0  ? traZODone (DESYREL) 100 MG tablet Take 1 tablet (100 mg total) by mouth at bedtime as needed for sleep. 30 tablet 3  ? ?No current facility-administered medications for this encounter.  ? ?Wt Readings from Last 3 Encounters:  ?10/12/21 98.2 kg (216 lb 9.6 oz)  ?10/03/21 96.2 kg (212 lb)  ?08/12/21 96.8 kg (213 lb 6.4 oz)  ? ?BP 106/70   Pulse 72   Wt 98.2 kg (216 lb 9.6 oz)   SpO2 97%   BMI 31.99 kg/m?  ?General:  NAD. No resp difficulty ?HEENT: Normal ?Neck: Supple. No JVD. Carotids 2+ bilat; no bruits. No lymphadenopathy or thryomegaly appreciated. ?Cor: PMI nondisplaced. Regular rate & rhythm. No rubs, gallops or murmurs. ?Lungs: Clear ?Abdomen: Soft, nontender, nondistended. No hepatosplenomegaly. No bruits or masses. Good bowel sounds. ?Extremities: No cyanosis, clubbing, rash, edema ?Neuro: Alert & oriented x  3, cranial nerves grossly intact. Moves all 4 extremities w/o difficulty. Affect pleasant. ? ?Assessment/Plan: ?1. 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  Echo in 5/22 with EF 15%, moderate LV dilation, mildly decreased RV function, mild MR. Ischemic cardiomyopathy based on 6/22 cath. RHC in 5/22 showed normal cardiac index.  Echo 2/23 with EF 25-30%, severe LV dilation with wall motion abnormalities, normal RV, mild MR.  On exam today, he is not volume overloaded.  NYHA class I-II symptoms.  ?- Continue digoxin 0.0625 mg daily. Check level today. ?- Continue Entresto 49/51 bid. Recent labs ok.  ?- Continue Coreg 12.5 mg bid.  ?- Continue eplerenone 50 mg daily (painful gynecomastia with spironolactone).   ?- Continue Farxiga 10 mg daily.  ?- Advised keeping Lasix PRN only (has been  taking every other day). ?- He has been referred to EP for recommended ICD.  Not CRT candidate with narrow QRS.  Has appt next month with Dr. Lovena Le. ?- Needs to stay off ETOH.  ?2. ETOH abuse: He has not had anything to drink since leaving the hospital. This may have contributed to his cardiomyopathy.  ?- I urged that he remain abstinent.  ?3. Cocaine abuse: This was rare and he has not used since discharge.  ?4. MRSA bacteremia: ?PNA source.  TEE 5/22 showed no endocarditis. ?5 CAD: Cath 6/22 with occluded ostial PDA, mid ramus, and proximal LAD, all with collaterals.  No chest pain, presentation in 5/22 with CHF.  No recent chest pain.  ?- Continue ASA 81 and statin.  ?- He saw Dr. Cyndia Bent.  Given lack of chest pain and poor targets for CABG, CABG was not recommended.  Continue medical management.  ? ?Follow up in 3-4 months with Dr. Aundra Dubin. ? ?Rafael Bihari FNP-BC ?10/12/2021 ?

## 2021-10-11 NOTE — Telephone Encounter (Signed)
Called to confirm/remind patient of their appointment at the [facility] on 10/12/21.  ? ?Patient reminded to bring all medications and/or complete list. ? ?Confirmed patient has transportation. Gave directions, instructed to utilize valet parking. ? ?Confirmed appointment prior to ending call.  ? ? ? ?

## 2021-10-12 ENCOUNTER — Encounter (HOSPITAL_COMMUNITY): Payer: Self-pay

## 2021-10-12 ENCOUNTER — Ambulatory Visit (HOSPITAL_COMMUNITY)
Admission: RE | Admit: 2021-10-12 | Discharge: 2021-10-12 | Disposition: A | Discharge status: Home / Self Care | Payer: Self-pay | Source: Ambulatory Visit | Attending: Family Medicine | Admitting: Family Medicine

## 2021-10-12 VITALS — BP 106/70 | HR 72 | Wt 216.6 lb

## 2021-10-12 DIAGNOSIS — F1411 Cocaine abuse, in remission: Secondary | ICD-10-CM | POA: Insufficient documentation

## 2021-10-12 DIAGNOSIS — I429 Cardiomyopathy, unspecified: Secondary | ICD-10-CM | POA: Insufficient documentation

## 2021-10-12 DIAGNOSIS — I340 Nonrheumatic mitral (valve) insufficiency: Secondary | ICD-10-CM | POA: Insufficient documentation

## 2021-10-12 DIAGNOSIS — Z7982 Long term (current) use of aspirin: Secondary | ICD-10-CM | POA: Insufficient documentation

## 2021-10-12 DIAGNOSIS — Z7984 Long term (current) use of oral hypoglycemic drugs: Secondary | ICD-10-CM | POA: Insufficient documentation

## 2021-10-12 DIAGNOSIS — F1010 Alcohol abuse, uncomplicated: Secondary | ICD-10-CM

## 2021-10-12 DIAGNOSIS — Z79899 Other long term (current) drug therapy: Secondary | ICD-10-CM | POA: Insufficient documentation

## 2021-10-12 DIAGNOSIS — F1011 Alcohol abuse, in remission: Secondary | ICD-10-CM | POA: Insufficient documentation

## 2021-10-12 DIAGNOSIS — I2510 Atherosclerotic heart disease of native coronary artery without angina pectoris: Secondary | ICD-10-CM | POA: Insufficient documentation

## 2021-10-12 DIAGNOSIS — Z8614 Personal history of Methicillin resistant Staphylococcus aureus infection: Secondary | ICD-10-CM

## 2021-10-12 DIAGNOSIS — I5022 Chronic systolic (congestive) heart failure: Secondary | ICD-10-CM | POA: Insufficient documentation

## 2021-10-12 LAB — DIGOXIN LEVEL: Digoxin Level: 0.5 ng/mL — ABNORMAL LOW (ref 0.8–2.0)

## 2021-10-12 NOTE — Addendum Note (Signed)
Encounter addended by: Theresia Bough, CMA on: 10/12/2021 2:39 PM ? Actions taken: Order list changed, Diagnosis association updated, Clinical Note Signed, Charge Capture section accepted

## 2021-10-12 NOTE — Patient Instructions (Addendum)
CHANGE Lasix 20 mg one tab as needed for 3 lb weight gain overnight or 5 lbs weight gain in one week ? ? ?Labs today ?We will only contact you if something comes back abnormal or we need to make some changes. ?Otherwise no news is good news! ? ?Your physician recommends that you schedule a follow-up appointment in: 3-4 months with Dr Shirlee Latch ? ? ?Do the following things EVERYDAY: ?Weigh yourself in the morning before breakfast. Write it down and keep it in a log. ?Take your medicines as prescribed ?Eat low salt foods--Limit salt (sodium) to 2000 mg per day.  ?Stay as active as you can everyday ?Limit all fluids for the day to less than 2 liters ? ?At the [facility], you and your health needs are our priority. As part of our continuing mission to provide you with exceptional heart care, we have created designated Provider Care Teams. These Care Teams include your primary Cardiologist (physician) and Advanced Practice Providers (APPs- Physician Assistants and Nurse Practitioners) who all work together to provide you with the care you need, when you need it.  ? ?You may see any of the following providers on your designated Care Team at your next follow up: ?Dr Arvilla Meres ?Dr Marca Ancona ?[name], NP ?Robbie Lis, PA ?Jessica Milford,NP ?Anna Genre, PA ?Karle Plumber, PharmD ? ? ?Please be sure to bring in all your medications bottles to every appointment.  ? ? ?

## 2021-10-14 ENCOUNTER — Other Ambulatory Visit (HOSPITAL_COMMUNITY): Payer: Self-pay

## 2021-10-14 ENCOUNTER — Other Ambulatory Visit (HOSPITAL_COMMUNITY): Payer: Self-pay | Admitting: Cardiology

## 2021-10-14 MED ORDER — ATORVASTATIN CALCIUM 40 MG PO TABS
40.0000 mg | ORAL_TABLET | Freq: Every day | ORAL | 3 refills | Status: DC
Start: 2021-10-14 — End: 2022-02-27
  Filled 2021-10-14: qty 30, 30d supply, fill #0
  Filled 2021-11-09: qty 30, 30d supply, fill #1
  Filled 2021-12-26: qty 30, 30d supply, fill #2
  Filled 2022-01-25: qty 30, 30d supply, fill #3

## 2021-11-01 ENCOUNTER — Other Ambulatory Visit (HOSPITAL_COMMUNITY): Payer: Self-pay | Admitting: *Deleted

## 2021-11-01 MED ORDER — ENTRESTO 49-51 MG PO TABS
1.0000 | ORAL_TABLET | Freq: Two times a day (BID) | ORAL | 3 refills | Status: DC
Start: 2021-11-01 — End: 2021-11-09

## 2021-11-09 ENCOUNTER — Other Ambulatory Visit (HOSPITAL_COMMUNITY): Payer: Self-pay | Admitting: Pharmacist

## 2021-11-09 ENCOUNTER — Other Ambulatory Visit (HOSPITAL_COMMUNITY): Payer: Self-pay

## 2021-11-09 MED ORDER — ENTRESTO 49-51 MG PO TABS: 1.0000 | ORAL_TABLET | Freq: Two times a day (BID) | ORAL | 3 refills | Status: DC

## 2021-11-15 ENCOUNTER — Institutional Professional Consult (permissible substitution): Payer: Medicaid Other | Admitting: Internal Medicine

## 2021-11-18 ENCOUNTER — Telehealth (HOSPITAL_COMMUNITY): Payer: Self-pay | Admitting: Pharmacy Technician

## 2021-11-18 ENCOUNTER — Other Ambulatory Visit (HOSPITAL_COMMUNITY): Payer: Self-pay

## 2021-11-18 ENCOUNTER — Other Ambulatory Visit (HOSPITAL_COMMUNITY): Payer: Self-pay | Admitting: *Deleted

## 2021-11-18 NOTE — Telephone Encounter (Signed)
Advanced Heart Failure Patient Advocate Encounter  Patient's wife called and said she has not been able to fill Entresto with [employer]. She stated that the representative said that the doctor would have to call and speak with them. [employer]. The representative was able to pull the RX that was e-scribed on 06/07 out of the fax queue and confirm receipt.   Only issue now is that the address in the patient's chart does not match the address on the application. The patient would need to call and verify which address is correct in order for the medication to be shipped. Called both numbers in the patient's chart and left a vm on the home number.   Archer Asa, CPhT

## 2021-11-22 ENCOUNTER — Other Ambulatory Visit (HOSPITAL_COMMUNITY): Payer: Self-pay

## 2021-12-26 ENCOUNTER — Other Ambulatory Visit (HOSPITAL_COMMUNITY): Payer: Self-pay

## 2022-01-03 ENCOUNTER — Other Ambulatory Visit (HOSPITAL_COMMUNITY): Payer: Self-pay | Admitting: Family Medicine

## 2022-01-03 DIAGNOSIS — I5022 Chronic systolic (congestive) heart failure: Secondary | ICD-10-CM

## 2022-01-25 ENCOUNTER — Other Ambulatory Visit (HOSPITAL_COMMUNITY): Payer: Self-pay

## 2022-01-26 ENCOUNTER — Institutional Professional Consult (permissible substitution): Payer: Medicaid Other | Admitting: Internal Medicine

## 2022-01-27 ENCOUNTER — Encounter (HOSPITAL_COMMUNITY): Payer: Medicaid Other | Admitting: Cardiology

## 2022-02-15 ENCOUNTER — Other Ambulatory Visit (HOSPITAL_COMMUNITY): Payer: Self-pay

## 2022-02-15 ENCOUNTER — Telehealth (HOSPITAL_COMMUNITY): Payer: Self-pay

## 2022-02-15 NOTE — Telephone Encounter (Signed)
Advanced Heart Failure Patient Advocate Encounter  Reviewed patient assistance for Entresto. Assistance program needs to be RENEWED for this medication.  Reviewed patient assistance for Farxiga. Patient is currently taking Marcelline Deist; would need to switch to Jardiance in attempt to apply for [facility] CARES assistance.  Attempted to call patient to start application process, left voicemail.  Burnell Blanks, CPhT Rx Patient Advocate Phone: [phone]

## 2022-02-20 IMAGING — MR MR HEAD W/O CM
6 series · 46 of 48 positions shown · non-contrast
Comparison: CT studies earlier same day

CLINICAL DATA: Acute neurological deficit. Altered mental status.
Negative acute CT evaluation.

EXAM:
MRI HEAD WITHOUT CONTRAST
TECHNIQUE: Multiplanar, multiecho pulse sequences of the brain and surrounding
structures were obtained without intravenous contrast.

[Series 2: DWI · axial · 3.0mm · 0.94mm/px · z∈[-68,+79]mm · 14 of 100 slices shown (1 of 2)]
[im 1/100]
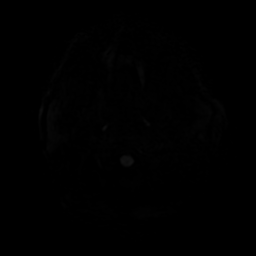
[im 7/100]
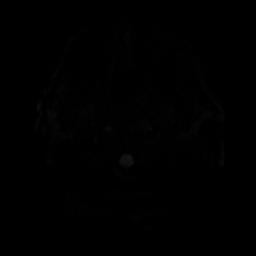
[im 14/100]
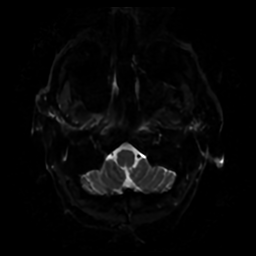
[im 20/100]
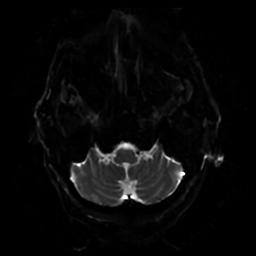
[im 27/100]
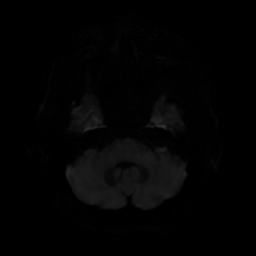
[im 34/100]
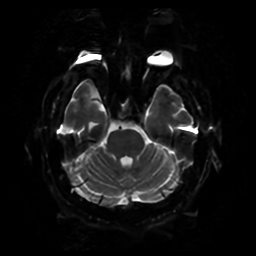
[im 40/100]
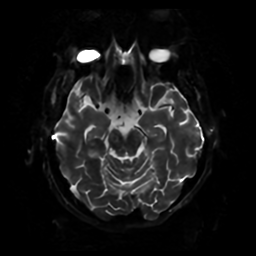
[im 47/100]
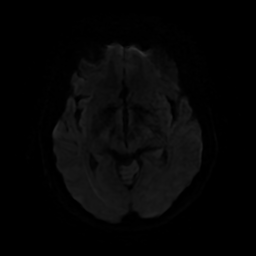
[im 53/100]
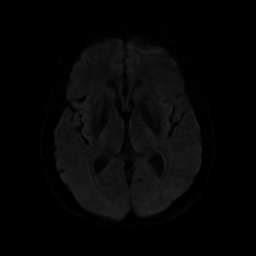
[im 60/100]
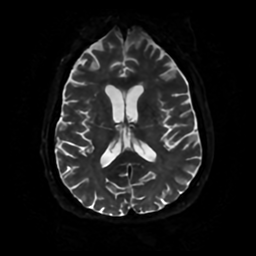
[im 67/100]
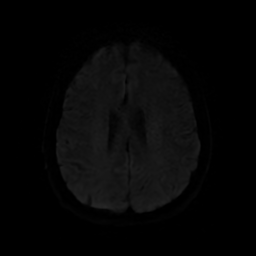
[im 73/100]
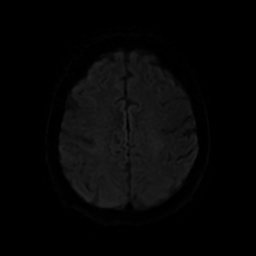
[im 86/100]
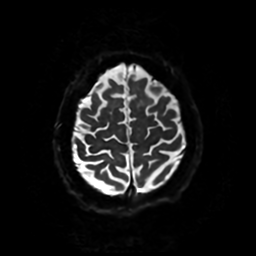
[im 100/100]
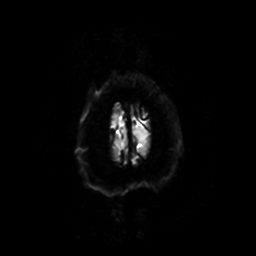

[Series 3: FLAIR · axial · 3.0mm · 0.45mm/px · z∈[-66,+77]mm · 4 of 25 slices shown]
[im 1/25]
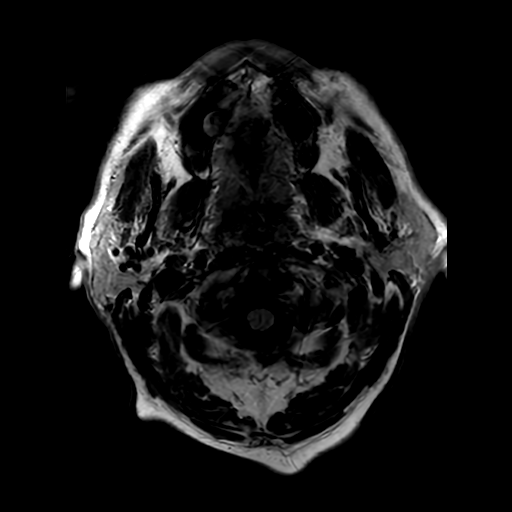
[im 9/25]
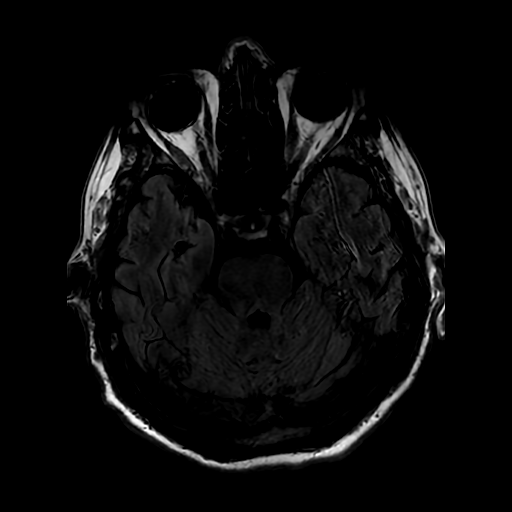
[im 17/25]
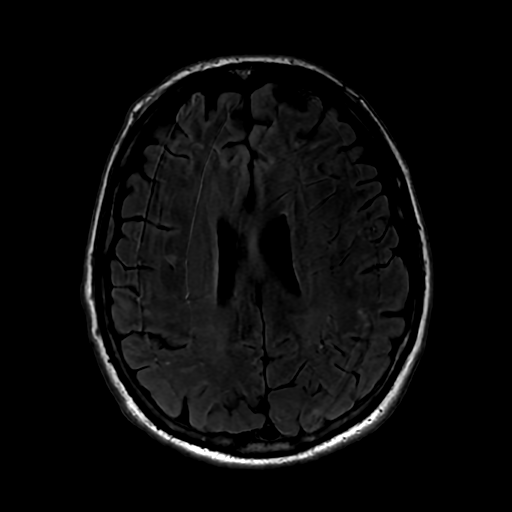
[im 25/25]
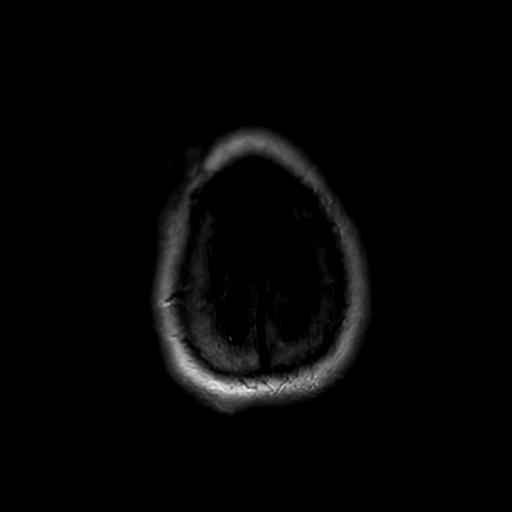

[Series 4: DWI · coronal · 4.0mm · 0.94mm/px · 11 of 72 slices shown (2 of 2)]
[im 1/72]
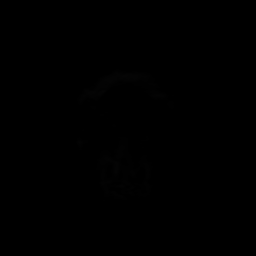
[im 8/72]
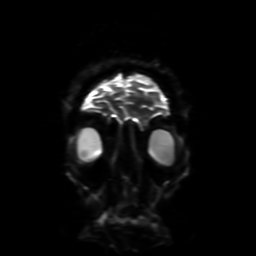
[im 15/72]
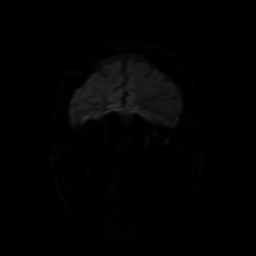
[im 22/72]
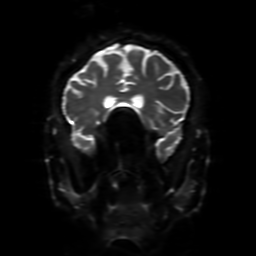
[im 29/72]
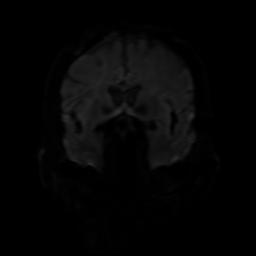
[im 36/72]
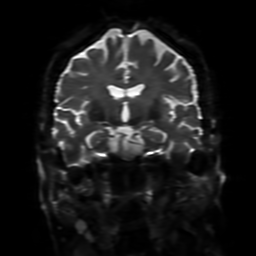
[im 43/72]
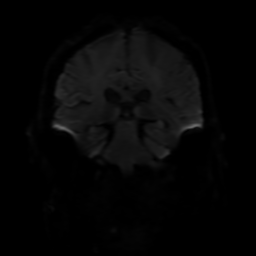
[im 50/72]
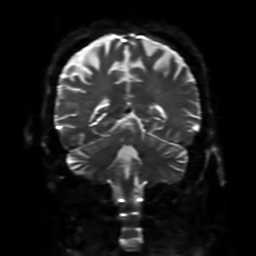
[im 57/72]
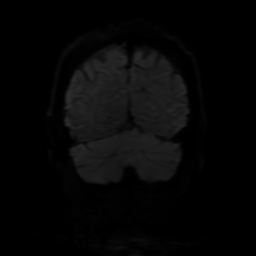
[im 64/72]
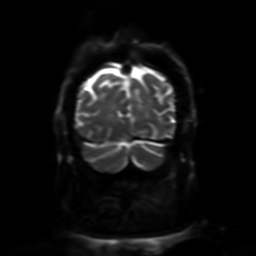
[im 72/72]
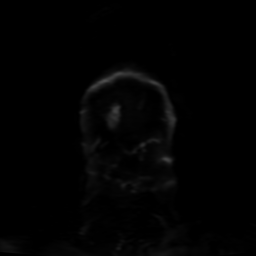

[Series 5: GRE · axial · 5.0mm · 0.43mm/px · z∈[-61,+80]mm · 4 of 27 slices shown]
[im 1/27]
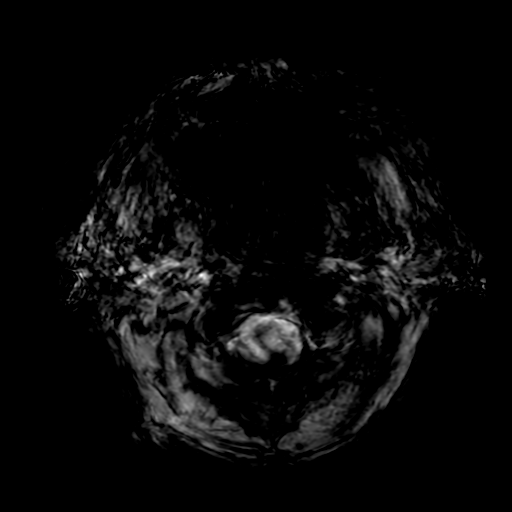
[im 9/27]
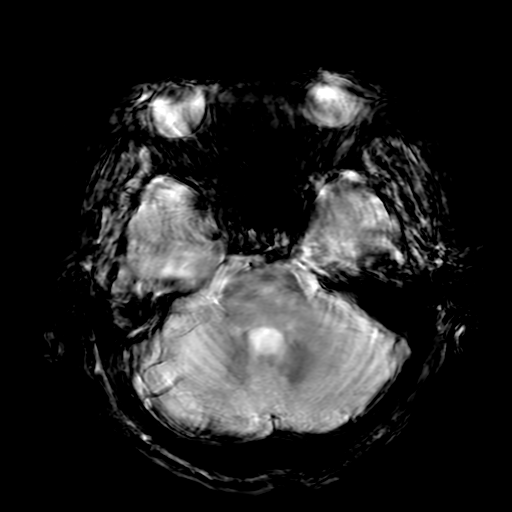
[im 18/27]
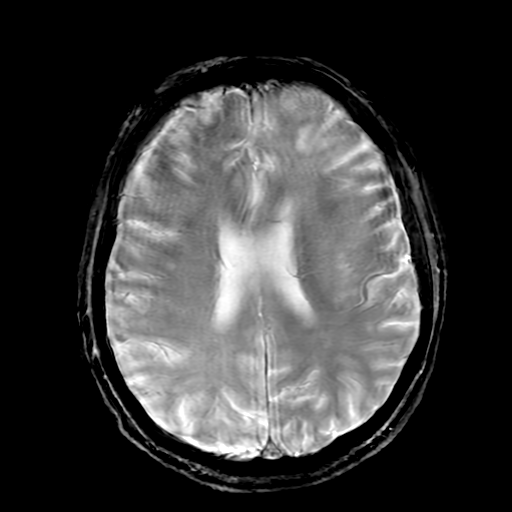
[im 27/27]
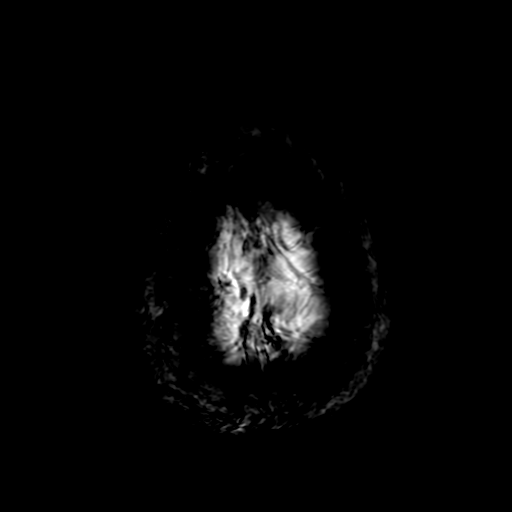

[Series 250: ADC · axial · 3.0mm · 0.94mm/px · z∈[-68,+79]mm · 8 of 50 slices shown (1 of 2)]
[im 1/50]
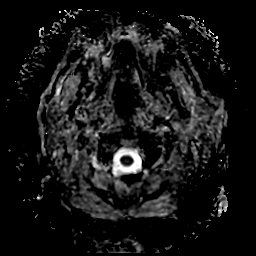
[im 8/50]
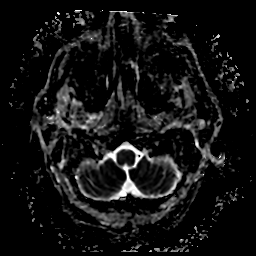
[im 15/50]
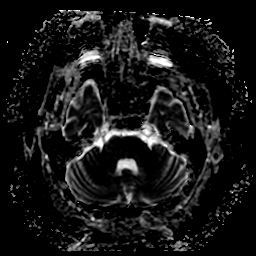
[im 22/50]
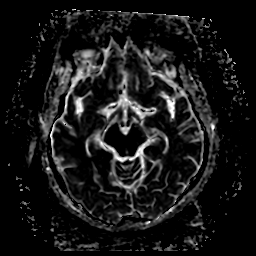
[im 29/50]
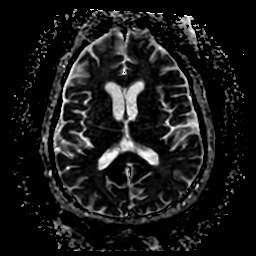
[im 36/50]
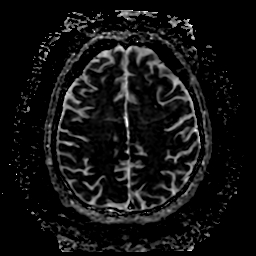
[im 43/50]
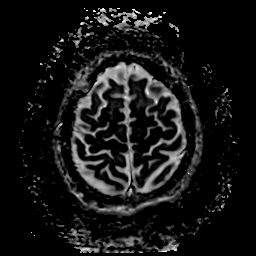
[im 50/50]
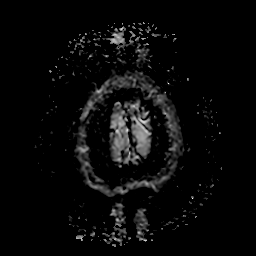

[Series 450: ADC · coronal · 4.0mm · 0.94mm/px · 5 of 35 slices shown (2 of 2)]
[im 1/35]
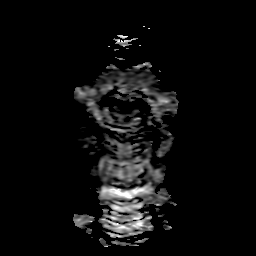
[im 9/35]
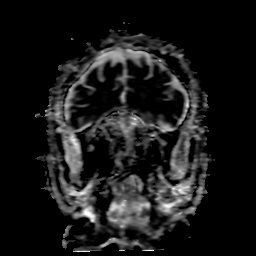
[im 18/35]
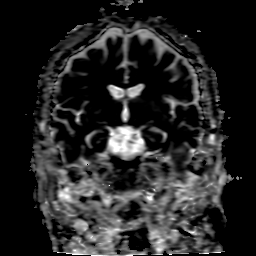
[im 26/35]
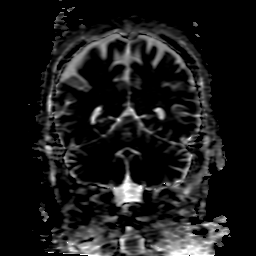
[im 35/35]
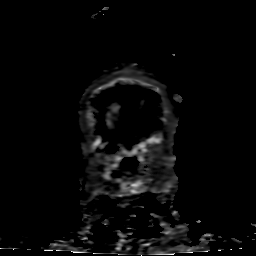

[46 of 48 positions shown; findings below may reference images not displayed]

FINDINGS: Brain: Diffusion imaging does not show any acute or subacute
infarction. No focal abnormality affects the brainstem or
cerebellum. Cerebral hemispheres show a few scattered foci of T2 and
FLAIR signal within the white matter consistent with minimal small
vessel change. No cortical or large vessel territory abnormality. No
mass lesion, hemorrhage, hydrocephalus or extra-axial collection.

Vascular: Major vessels at the base of the brain show flow.

Skull and upper cervical spine: Negative

Sinuses/Orbits: Clear/normal

Other: None
IMPRESSION: No acute finding. Normal study except for a few punctate foci of T2
and FLAIR signal within the cerebral hemispheric white matter
consistent with minimal small vessel change.

## 2022-02-27 ENCOUNTER — Other Ambulatory Visit (HOSPITAL_COMMUNITY): Payer: Self-pay | Admitting: Cardiology

## 2022-02-27 ENCOUNTER — Other Ambulatory Visit (HOSPITAL_COMMUNITY): Payer: Self-pay

## 2022-02-27 MED ORDER — DIGOXIN 125 MCG PO TABS
0.0625 mg | ORAL_TABLET | Freq: Every day | ORAL | 11 refills | Status: DC
  Filled 2022-02-27: qty 15, 30d supply, fill #0
  Filled 2022-04-03: qty 15, 30d supply, fill #1
  Filled 2022-05-01: qty 15, 30d supply, fill #2

## 2022-02-27 MED ORDER — ASPIRIN 81 MG PO CHEW
81.0000 mg | CHEWABLE_TABLET | Freq: Every day | ORAL | 11 refills | Status: AC
  Filled 2022-02-27: qty 30, 30d supply, fill #0
  Filled 2022-04-03: qty 30, 30d supply, fill #1
  Filled 2022-07-17: qty 36, 36d supply, fill #2

## 2022-02-27 MED ORDER — ATORVASTATIN CALCIUM 40 MG PO TABS
40.0000 mg | ORAL_TABLET | Freq: Every day | ORAL | 3 refills | Status: DC
  Filled 2022-02-27: qty 30, 30d supply, fill #0
  Filled 2022-04-03: qty 30, 30d supply, fill #1
  Filled 2022-05-01: qty 30, 30d supply, fill #2
  Filled 2022-06-13 (×2): qty 30, 30d supply, fill #3

## 2022-03-03 NOTE — Telephone Encounter (Signed)
Advanced Heart Failure Patient Advocate Encounter  Spoke to patient on phone, application forms need to be signed for [employer] ([place]) and  [employer] ([place]).  Copies of both have been left at the registration desk for patients upcoming 03/13/2022 appointment.  Clista Bernhardt, CPhT Rx Patient Advocate Phone: [phone]

## 2022-03-13 ENCOUNTER — Other Ambulatory Visit: Payer: Self-pay

## 2022-03-13 ENCOUNTER — Other Ambulatory Visit (HOSPITAL_COMMUNITY): Payer: Self-pay | Admitting: Cardiology

## 2022-03-13 ENCOUNTER — Other Ambulatory Visit (HOSPITAL_COMMUNITY): Payer: Self-pay

## 2022-03-13 ENCOUNTER — Ambulatory Visit (HOSPITAL_COMMUNITY)
Admission: RE | Admit: 2022-03-13 | Discharge: 2022-03-13 | Disposition: A | Discharge status: Home / Self Care | Payer: Medicaid Other | Source: Ambulatory Visit | Attending: Cardiology | Admitting: Cardiology

## 2022-03-13 VITALS — BP 140/83 | HR 89 | Wt 219.6 lb

## 2022-03-13 DIAGNOSIS — Z5941 Food insecurity: Secondary | ICD-10-CM | POA: Insufficient documentation

## 2022-03-13 DIAGNOSIS — I110 Hypertensive heart disease with heart failure: Secondary | ICD-10-CM | POA: Insufficient documentation

## 2022-03-13 DIAGNOSIS — M179 Osteoarthritis of knee, unspecified: Secondary | ICD-10-CM | POA: Insufficient documentation

## 2022-03-13 DIAGNOSIS — E782 Mixed hyperlipidemia: Secondary | ICD-10-CM

## 2022-03-13 DIAGNOSIS — I2510 Atherosclerotic heart disease of native coronary artery without angina pectoris: Secondary | ICD-10-CM | POA: Insufficient documentation

## 2022-03-13 DIAGNOSIS — Z79899 Other long term (current) drug therapy: Secondary | ICD-10-CM | POA: Insufficient documentation

## 2022-03-13 DIAGNOSIS — I5022 Chronic systolic (congestive) heart failure: Secondary | ICD-10-CM

## 2022-03-13 DIAGNOSIS — E785 Hyperlipidemia, unspecified: Secondary | ICD-10-CM | POA: Insufficient documentation

## 2022-03-13 DIAGNOSIS — Z7984 Long term (current) use of oral hypoglycemic drugs: Secondary | ICD-10-CM | POA: Insufficient documentation

## 2022-03-13 DIAGNOSIS — Z5986 Financial insecurity: Secondary | ICD-10-CM | POA: Insufficient documentation

## 2022-03-13 DIAGNOSIS — I429 Cardiomyopathy, unspecified: Secondary | ICD-10-CM | POA: Insufficient documentation

## 2022-03-13 LAB — BASIC METABOLIC PANEL
Anion gap: 7 (ref 5–15)
BUN: 13 mg/dL (ref 8–23)
CO2: 26 mmol/L (ref 22–32)
Calcium: 9.3 mg/dL (ref 8.9–10.3)
Chloride: 106 mmol/L (ref 98–111)
Creatinine, Ser: 0.81 mg/dL (ref 0.61–1.24)
GFR, Estimated: >60 mL/min (ref >60)
Glucose, Bld: 146 mg/dL — ABNORMAL HIGH (ref 70–99)
Potassium: 4.7 mmol/L (ref 3.5–5.1)
Sodium: 139 mmol/L (ref 135–145)

## 2022-03-13 LAB — LIPID PANEL
Cholesterol: 140 mg/dL (ref 0–200)
HDL: 39 mg/dL — ABNORMAL LOW (ref >40)
LDL Cholesterol: 67 mg/dL (ref 0–99)
Total CHOL/HDL Ratio: 3.6 RATIO
Triglycerides: 169 mg/dL — ABNORMAL HIGH (ref <150)
VLDL: 34 mg/dL (ref 0–40)

## 2022-03-13 LAB — DIGOXIN LEVEL: Digoxin Level: 0.3 ng/mL — ABNORMAL LOW (ref 0.8–2.0)

## 2022-03-13 MED ORDER — TAMSULOSIN HCL 0.4 MG PO CAPS
0.4000 mg | ORAL_CAPSULE | Freq: Every day | ORAL | 0 refills | Status: DC
  Filled 2022-03-13: qty 30, 30d supply, fill #0

## 2022-03-13 MED ORDER — ENTRESTO 97-103 MG PO TABS: 1.0000 | ORAL_TABLET | Freq: Two times a day (BID) | ORAL | 3 refills | Status: DC

## 2022-03-13 MED ORDER — ENTRESTO 97-103 MG PO TABS
1.0000 | ORAL_TABLET | Freq: Two times a day (BID) | ORAL | 11 refills | Status: DC
Start: 2022-03-13 — End: 2022-03-13

## 2022-03-13 MED ORDER — FAMOTIDINE 20 MG PO TABS
20.0000 mg | ORAL_TABLET | Freq: Every day | ORAL | 0 refills | Status: DC
  Filled 2022-03-13: qty 30, 30d supply, fill #0

## 2022-03-13 MED ORDER — ENTRESTO 97-103 MG PO TABS: 1.0000 | ORAL_TABLET | Freq: Two times a day (BID) | ORAL | 11 refills | Status: DC

## 2022-03-13 NOTE — Patient Instructions (Signed)
Increase your Delene Loll to 97/103 Twice daily  Labs done today, your results will be available in MyChart, we will contact you for abnormal readings.  Repeat blood work in 10 days.  You have been referred to Dr. Marlou Sa the Orthopedic Doctor. His office will call you to arrange your appointment.  Please follow up with our heart failure pharmacist in 3 weeks  Your physician recommends that you schedule a follow-up appointment in: 2 months  If you have any questions or concerns before your next appointment please send [place] a message through [place] or call our office at [phone].    TO LEAVE A MESSAGE FOR THE NURSE SELECT OPTION 2, PLEASE LEAVE A MESSAGE INCLUDING: YOUR NAME DATE OF BIRTH CALL BACK NUMBER REASON FOR CALL**this is important as we prioritize the call backs  YOU WILL RECEIVE A CALL BACK THE SAME DAY AS LONG AS YOU CALL BEFORE 4:00 PM  At the [facility], you and your health needs are our priority. As part of our continuing mission to provide you with exceptional heart care, we have created designated Provider Care Teams. These Care Teams include your primary Cardiologist (physician) and Advanced Practice Providers (APPs- Physician Assistants and Nurse Practitioners) who all work together to provide you with the care you need, when you need it.   You may see any of the following providers on your designated Care Team at your next follow up: Dr Glori Bickers Dr Loralie Champagne Dr. Roxana Hires, NP Lyda Jester, [place] [facility] [place], [place] Forestine Na, NP Audry Riles, PharmD   Please be sure to bring in all your medications bottles to every appointment.

## 2022-03-13 NOTE — Progress Notes (Signed)
H&V Care Navigation CSW Progress Note  Clinical Social Worker met with patient to assist with follow up regarding insurance.  Patient is participating in a Managed Medicaid Plan:  family planning medicaid  Patient states he was approved for [facility] Family Planning about a year ago. He states that his health has been declining and has not heard anything further with disability status. Patient had been referred through [facility] [employer] and appears that he was denied and provided with Family Planning. Patient instructed to follow up with DSS for further review of his disability status and possible new application for medicaid. Patient also requested assistance with transportation/gas card as he is low on finances.  CSW provided gas card through the Patient Care Fund and information on DSS to follow up regarding his current status with medicaid. Patient advised to return call to [place] after DSS visit for further assistance. Patient verbalizes understanding. Raquel Sarna, [place], [place]   SDOH Screenings   Food Insecurity: Food Insecurity Present (03/13/2022)  Housing: Low Risk  (03/13/2022)  Transportation Needs: Unmet Transportation Needs (03/13/2022)  Financial Resource Strain: High Risk (03/13/2022)  Tobacco Use: Medium Risk (10/12/2021)

## 2022-03-13 NOTE — Addendum Note (Signed)
Encounter addended by: Larey Dresser, MD on: 03/13/2022 10:44 PM  Actions taken: Clinical Note Signed, Level of Service modified

## 2022-03-13 NOTE — Progress Notes (Signed)
PCP: Pcp, No Cardiology: Dr. Shirlee Latch  Kyle Peck is a 61 y.o. with a history of arthritis, ETOH abuse, tobacco abuse, THC use, and cocaine abuse.  Presented to [facility] on 10/21/20 with progressive shortness of breath and fatigue. CXR with pulmonary edema. BNP 2252. Echo showed severely reduced EF 15% and mildly reduced RV. IV Lasix started.  He developed lethargy with respiratory failure, suspect ETOH withdrawal. Required intubation 10/24/20. Became hypotensive requiring norepinephrine and vasopressin, he developed AKI and had CVVH transiently. He was found to have MRSA bacteremia, possibly from PNA.  TEE showed no endocarditis.  Eventually he had right and left heart cath, showing severe 3 vessel disease.  We recommended CABG but wanted MRI viability study first.  Patient decided to leave AMA.  He did get his medications prior to discharge and consented to wear a Lifevest.    He subsequently saw Dr. Laneta Simmers in the office, was not thought to have good targets for CABG.  Given lack of chest pain, CABG was not recommended.   Echo 2/23 showed EF 25-30%, severe LV dilation with wall motion abnormalities, normal RV, mild Kyle. Referred to EP for ICD but does not have insurance coverage [facility] Family Planning).  Today he returns for HF follow up. He is out of [place], otherwise taking all his meds.  Weight up 3 lbs. Not exercising due to knee pain, this is his main limitation. No significant exertional dyspnea.  No orthopnea/PND.  No chest pain.  No smoking, cocaine, or ETOH.    ECG (personally reviewed): NSR, PACs, old ASMI  Labs (6/22): K 4, creatinine 0.73 Labs (11/22): K 3.9, creatinine 0.87, digoxin < 0.2 Labs (5/23): K 4.0, creatinine 1.00  PMH: 1. ETOH abuse.  2. Smoking 3. Prior cocaine use.  4. MRSA bacteremia 5/22 - TEE (5/22): no endocarditis.  5. CAD: LHC (5/22) with totally occluded pLAD with collaterals, totally occluded ramus with collaterals, 70% proximal RCA, PDA totally occluded with  collaterals. CABG recommended.  6. Chronic systolic CHF: Ischemic cardiomyopathy.  Echo (5/22) with EF 15%, moderate LV enlargement, moderate RV enlargement with mildly decreased RV systolic function, mild Kyle.  - RHC (5/22): mean RA 3, PA 26/4, mean PCWP 5, CI 2.76 Fick, CI 3.01 thermo.  - Echo (3/23): EF 25-30%, severe LV dilation with wall motion abnormalities, normal RV, mild Kyle.  7. BPH 8. Knee OA 9. HTN  Social History   Socioeconomic History   Marital status: Married    Spouse name: Kyle Peck   Number of children: Not on file   Years of education: Not on file   Highest education level: Not on file  Occupational History   Not on file  Tobacco Use   Smoking status: Former    Packs/day: 0.50    Types: Cigars, Cigarettes   Smokeless tobacco: Never  Vaping Use   Vaping Use: Never used  Substance and Sexual Activity   Alcohol use: Yes    Alcohol/week: 15.0 standard drinks of alcohol    Types: 15 Shots of liquor per week    Comment: intermittently   Drug use: Yes    Frequency: 2.0 times per week    Types: Marijuana    Comment: "also around cocaine before"   Sexual activity: Not on file  Other Topics Concern   Not on file  Social History Narrative   Not on file   Social Determinants of Health   Financial Resource Strain: High Risk (03/13/2022)   Overall Financial Resource Strain (CARDIA)  Difficulty of Paying Living Expenses: Very hard  Food Insecurity: Food Insecurity Present (03/13/2022)   Hunger Vital Sign    Worried About Running Out of Food in the Last Year: Sometimes true    Ran Out of Food in the Last Year: Sometimes true  Transportation Needs: Unmet Transportation Needs (03/13/2022)   PRAPARE - Administrator, Civil Service (Medical): Yes    Lack of Transportation (Non-Medical): Yes  Physical Activity: Not on file  Stress: Not on file  Social Connections: Not on file  Intimate Partner Violence: Not on file   Family History  Problem Relation Age  of Onset   Congestive Heart Failure Mother    Congestive Heart Failure Father    ROS: All systems reviewed and negative except as per HPI.   Current Outpatient Medications  Medication Sig Dispense Refill   aspirin 81 MG chewable tablet Chew 1 tablet (81 mg total) by mouth daily. 30 tablet 11   atorvastatin (LIPITOR) 40 MG tablet Take 1 tablet (40 mg total) by mouth daily. 30 tablet 3   carvedilol (COREG) 12.5 MG tablet Take 1 tablet (12.5 mg total) by mouth 2 (two) times daily. 180 tablet 3   dapagliflozin propanediol (FARXIGA) 10 MG TABS tablet Take 1 tablet (10 mg total) by mouth daily before breakfast. 90 tablet 3   digoxin (LANOXIN) 0.125 MG tablet Take 0.5 tablets (0.0625 mg total) by mouth daily. 15 tablet 11   eplerenone (INSPRA) 50 MG tablet Take 1 tablet (50 mg total) by mouth daily. 30 tablet 11   famotidine (PEPCID) 20 MG tablet Take 1 tablet (20 mg total) by mouth daily. 30 tablet 0   furosemide (LASIX) 20 MG tablet Take 1 tablet (20 mg total) by mouth daily as needed. 30 tablet 6   traZODone (DESYREL) 100 MG tablet Take 1 tablet (100 mg total) by mouth at bedtime as needed for sleep. 30 tablet 3   sacubitril-valsartan (ENTRESTO) 97-103 MG Take 1 tablet by mouth 2 (two) times daily. 180 tablet 3   tamsulosin (FLOMAX) 0.4 MG CAPS capsule Take 1 capsule (0.4 mg total) by mouth daily after supper. 30 capsule 0   No current facility-administered medications for this encounter.   Wt Readings from Last 3 Encounters:  03/13/22 99.6 kg (219 lb 9.6 oz)  10/12/21 98.2 kg (216 lb 9.6 oz)  10/03/21 96.2 kg (212 lb)   BP (!) 140/83   Pulse 89   Wt 99.6 kg (219 lb 9.6 oz)   SpO2 98%   BMI 32.43 kg/m  General: NAD Neck: No JVD, no thyromegaly or thyroid nodule.  Lungs: Clear to auscultation bilaterally with normal respiratory effort. CV: Nondisplaced PMI.  Heart regular S1/S2, no S3/S4, no murmur.  No peripheral edema.  No carotid bruit.  Normal pedal pulses.  Abdomen: Soft,  nontender, no hepatosplenomegaly, no distention.  Skin: Intact without lesions or rashes.  Neurologic: Alert and oriented x 3.  Psych: Normal affect. Extremities: No clubbing or cyanosis.  HEENT: Normal.   Assessment/Plan: 1. 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  Echo in 5/22 with EF 15%, moderate LV dilation, mildly decreased RV function, mild Kyle. Ischemic cardiomyopathy based on 6/22 cath. RHC in 5/22 showed normal cardiac index.  Echo 2/23 with EF 25-30%, severe LV dilation with wall motion abnormalities, normal RV, mild Kyle.  He is not volume overloaded on exam, NYHA class I-II.  - Continue digoxin 0.0625 mg daily. Check level today. - Increase Entresto to 97/103  bid, BMET/BNP today and BMET in 10 days.  - Continue Coreg 12.5 mg bid.  - Continue eplerenone 50 mg daily (painful gynecomastia with spironolactone).   - Continue Farxiga 10 mg daily.  - He can take Lasix prn.  - He has been referred to EP for recommended ICD.  Not CRT candidate with narrow QRS.  However, needs to get better insurance (has Medicaid Family Planning, ICD not covered). He is working with our Education officer, museum to get regular Medicaid.  - Needs to stay off ETOH.  2. ETOH abuse: He has not had anything to drink since leaving the hospital. This may have contributed to his cardiomyopathy.  - I urged that he remain abstinent.  3. Cocaine abuse: This was rare and he has not used since discharge.  4. MRSA bacteremia: ?PNA source.  TEE 5/22 showed no endocarditis. 5 CAD: Cath 6/22 with occluded ostial PDA, mid ramus, and proximal LAD, all with collaterals.  No chest pain, presentation in 5/22 with CHF.  No recent chest pain.  - Continue ASA 81 and statin. Check lipids today.  - He saw Dr. Cyndia Bent.  Given lack of chest pain and poor targets for CABG, CABG was not recommended.  Continue medical management.  6. Knee OA: Significantly limiting.  I will refer to Dr. Marlou Sa with [facility],  would be candidate for steroid injection most likely though TKR would be high risk.   Follow up in 3 wks with HF pharmacist for med titration, see APP in 2 months.   Loralie Champagne  03/13/2022

## 2022-03-21 ENCOUNTER — Telehealth (HOSPITAL_COMMUNITY): Payer: Self-pay

## 2022-03-21 NOTE — Telephone Encounter (Signed)
Advanced Heart Failure Patient Advocate Encounter  Application for Entresto faxed to [employer] on 03/21/22. Application form attached to patient chart.  Clista Bernhardt, CPhT Rx Patient Advocate Phone: [phone]

## 2022-03-23 NOTE — Telephone Encounter (Signed)
Advanced Heart Failure Patient Advocate Encounter  [employer] is requesting POI before making a determination. Left message for patient to call back to start this process.  Also need to update HH size and resend application, will process together if possible.  Clista Bernhardt, CPhT Rx Patient Advocate Phone: [phone]

## 2022-03-29 ENCOUNTER — Telehealth: Payer: Self-pay

## 2022-03-29 ENCOUNTER — Ambulatory Visit (INDEPENDENT_AMBULATORY_CARE_PROVIDER_SITE_OTHER): Payer: Self-pay | Admitting: Orthopedic Surgery

## 2022-03-29 ENCOUNTER — Ambulatory Visit: Payer: Self-pay

## 2022-03-29 ENCOUNTER — Ambulatory Visit (INDEPENDENT_AMBULATORY_CARE_PROVIDER_SITE_OTHER): Payer: Self-pay

## 2022-03-29 DIAGNOSIS — M25561 Pain in right knee: Secondary | ICD-10-CM

## 2022-03-29 DIAGNOSIS — M25562 Pain in left knee: Secondary | ICD-10-CM

## 2022-03-29 DIAGNOSIS — M170 Bilateral primary osteoarthritis of knee: Secondary | ICD-10-CM

## 2022-03-29 NOTE — Telephone Encounter (Signed)
Auth for gel injection

## 2022-03-30 ENCOUNTER — Telehealth (HOSPITAL_COMMUNITY): Payer: Self-pay

## 2022-03-30 NOTE — Telephone Encounter (Signed)
Advanced Heart Failure Patient Surveyor, minerals for update; POI is requested for both members of the household. Patient will need to call ([phone]) [employer] for attestation letter, and patients wife will need to submit income statement.  Left message on patient voicemail.  Clista Bernhardt, CPhT Rx Patient Advocate Phone: [phone]

## 2022-03-30 NOTE — Telephone Encounter (Signed)
Called and left a VM advising patient that Medicaid does not cover gel injections, can get TriVisc, but would have to pay $291.00 OOP for injections.

## 2022-03-30 NOTE — Telephone Encounter (Signed)
Advanced Heart Failure Patient Advocate Encounter  Application for Jardiance faxed to [facility] on 03/30/2022. Application form attached to patient chart.  Previous fax was not received by [place], CPhT Rx Patient Advocate Phone: [phone]

## 2022-04-01 ENCOUNTER — Encounter: Payer: Self-pay | Admitting: Orthopedic Surgery

## 2022-04-01 MED ORDER — BUPIVACAINE HCL 0.25 % IJ SOLN
4.0000 mL | INTRAMUSCULAR | Status: AC | PRN
  Administered 2022-03-29: 4 mL via INTRA_ARTICULAR

## 2022-04-01 MED ORDER — LIDOCAINE HCL 1 % IJ SOLN
5.0000 mL | INTRAMUSCULAR | Status: AC | PRN
  Administered 2022-03-29: 5 mL

## 2022-04-01 MED ORDER — METHYLPREDNISOLONE ACETATE 40 MG/ML IJ SUSP
40.0000 mg | INTRAMUSCULAR | Status: AC | PRN
  Administered 2022-03-29: 40 mg via INTRA_ARTICULAR

## 2022-04-01 NOTE — Progress Notes (Signed)
Office Visit Note   Patient: Kyle Peck           Date of Birth: 04-29-1961           MRN: [id number] Visit Date: 03/29/2022 Requested by: Larey Dresser, MD [phone] [place],  [place] [address] PCP: Pcp, No  Subjective: Chief Complaint  Patient presents with   Right Knee - Pain   Left Knee - Pain    HPI: Kyle Peck is a 61 y.o. male who presents to the office reporting bilateral knee pain right equal to left.  Describes chronic pain.  He ambulates with a cane.  He has tried injections in the past without relief which was sustained.  Describes swelling weakness stiffness as well as pain that wakes him from sleep at night.  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  Currently not working due to other health issues.  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  He is working on getting his disability.  Does have good and bad days.  Using Tylenol and Biofreeze for symptoms..                ROS: All systems reviewed are negative as they relate to the chief complaint within the history of present illness.  Patient denies fevers or chills.  Assessment & Plan: Visit Diagnoses:  1. Pain in both knees, unspecified chronicity     Plan: Impression is bilateral knee arthritis which is particularly end-stage in the medial compartments.  Plan is bilateral knee aspiration and injections today.  And we will also preapproved for bilateral gel injections.  Not really in the market for total knee replacement at this time.  Continue with nonweightbearing quad strengthening exercises.  Would like for him to follow-up once these cortisone injections start to wear off. This patient is diagnosed with osteoarthritis of the knee(s).    Radiographs show evidence of joint space narrowing, osteophytes, subchondral sclerosis and/or subchondral cysts.  This patient has knee pain which interferes with functional and activities of daily living.    This  patient has experienced inadequate response, adverse effects and/or intolerance with conservative treatments such as acetaminophen, NSAIDS, topical creams, physical therapy or regular exercise, knee bracing and/or weight loss.   This patient has experienced inadequate response or has a contraindication to intra articular steroid injections for at least 3 months.   This patient is not scheduled to have a total knee replacement within 6 months of starting treatment with viscosupplementation.   Follow-Up Instructions: No follow-ups on file.   Orders:  Orders Placed This Encounter  Procedures   XR Knee 1-2 Views Right   XR Knee 1-2 Views Left   No orders of the defined types were placed in this encounter.     Procedures: Large Joint Inj: R knee on 03/29/2022 9:23 AM Indications: diagnostic evaluation, joint swelling and pain Details: 18 G 1.5 in needle, superolateral approach  Arthrogram: No  Medications: 5 mL lidocaine 1 %; 40 mg methylPREDNISolone acetate 40 MG/ML; 4 mL bupivacaine 0.25 % Outcome: tolerated well, no immediate complications Procedure, treatment alternatives, risks and benefits explained, specific risks discussed. Consent was given by the patient. Immediately prior to procedure a time out was called to verify the correct patient, procedure, equipment, support staff and site/side marked as required. Patient was prepped and draped in the usual sterile fashion.    Large Joint Inj: L knee on 03/29/2022 9:24 AM  Indications: diagnostic evaluation, joint swelling and pain Details: 18 G 1.5 in needle, superolateral approach  Arthrogram: No  Medications: 5 mL lidocaine 1 %; 40 mg methylPREDNISolone acetate 40 MG/ML; 4 mL bupivacaine 0.25 % Outcome: tolerated well, no immediate complications Procedure, treatment alternatives, risks and benefits explained, specific risks discussed. Consent was given by the patient. Immediately prior to procedure a time out was called to  verify the correct patient, procedure, equipment, support staff and site/side marked as required. Patient was prepped and draped in the usual sterile fashion.       Clinical Data: No additional findings.  Objective: Vital Signs: There were no vitals taken for this visit.  Physical Exam:  Constitutional: Patient appears well-developed HEENT:  Head: Normocephalic Eyes:EOM are normal Neck: Normal range of motion Cardiovascular: Normal rate Pulmonary/chest: Effort normal Neurologic: Patient is alert Skin: Skin is warm Psychiatric: Patient has normal mood and affect  Ortho Exam: Ortho exam demonstrates range of motion both knees of approximately 5 to past 90 degrees of flexion.  Collaterals are stable.  Both feet are perfused with intact ankle dorsiflexion.  No groin pain with internal/external rotation of either hip.  Specialty Comments:  No specialty comments available.  Imaging: No results found.   PMFS History: Patient Active Problem List   Diagnosis Date Noted   Hyperlipidemia 03/13/2022   Medication monitoring encounter 11/17/2020   Hepatic cirrhosis ([place]) 11/17/2020   MRSA bacteremia 10/27/2020   Cocaine abuse ([place]) 10/27/2020   Acute respiratory failure ([place]) 10/26/2020   Cardiogenic shock ([place]) 10/26/2020   Hyperkalemia 10/26/2020   Acute renal failure (ARF) ([place]) 10/26/2020   Acute encephalopathy 10/26/2020   Polysubstance abuse ([place]) 10/26/2020   ETOH abuse 10/26/2020   Pressure injury of skin 10/25/2020   CHF (congestive heart failure) ([place]) 10/21/2020   Elevated brain natriuretic peptide (BNP) level 10/21/2020   Transaminitis 10/21/2020   Thrombocytosis 10/21/2020   Hypoalbuminemia due to protein-calorie malnutrition ([place]) 10/21/2020   Tobacco use 10/21/2020   Past Medical History:  Diagnosis Date   Alcohol use    Arthritis    Marijuana use    Tobacco abuse     Family History  Problem Relation Age of Onset   Congestive Heart Failure Mother     Congestive Heart Failure Father     Past Surgical History:  Procedure Laterality Date   FRACTURE SURGERY     cheek    RIGHT/LEFT HEART CATH AND CORONARY ANGIOGRAPHY N/A 11/04/2020   Procedure: RIGHT/LEFT HEART CATH AND CORONARY ANGIOGRAPHY;  Surgeon: Larey Dresser, MD;  Location: [facility];  Service: Cardiovascular;  Laterality: N/A;   Social History   Occupational History   Not on file  Tobacco Use   Smoking status: Former    Packs/day: 0.50    Types: Cigars, Cigarettes   Smokeless tobacco: Never  Vaping Use   Vaping Use: Never used  Substance and Sexual Activity   Alcohol use: Yes    Alcohol/week: 15.0 standard drinks of alcohol    Types: 15 Shots of liquor per week    Comment: intermittently   Drug use: Yes    Frequency: 2.0 times per week    Types: Marijuana    Comment: "also around cocaine before"   Sexual activity: Not on file

## 2022-04-03 ENCOUNTER — Other Ambulatory Visit (HOSPITAL_COMMUNITY): Payer: Self-pay

## 2022-04-03 ENCOUNTER — Inpatient Hospital Stay (HOSPITAL_COMMUNITY): Admission: RE | Admit: 2022-04-03 | Payer: Medicaid Other | Source: Ambulatory Visit

## 2022-04-03 NOTE — Telephone Encounter (Signed)
Advanced Heart Failure Patient Advocate Encounter  Left message on patient voicemail regarding approval, and provided contact number for [employer].

## 2022-04-03 NOTE — Telephone Encounter (Signed)
Advanced Heart Failure Patient Advocate Encounter   Patient was approved to receive Jardiance from [place]  Effective dates: 03/31/22 through 03/31/23  Document scanned to chart.   Will get medication list updated to reflect Jardiance.   Charlann Boxer, CPhT

## 2022-04-03 NOTE — Telephone Encounter (Signed)
Advanced Heart Failure Patient Advocate Encounter  Left message with patient about Jardiance approval, along with a reminder that POI is still needed for the [facility].

## 2022-04-04 ENCOUNTER — Other Ambulatory Visit (HOSPITAL_COMMUNITY): Payer: Self-pay

## 2022-04-04 ENCOUNTER — Other Ambulatory Visit (HOSPITAL_COMMUNITY): Payer: Self-pay | Admitting: *Deleted

## 2022-04-04 MED ORDER — EMPAGLIFLOZIN 10 MG PO TABS: 10.0000 mg | ORAL_TABLET | Freq: Every day | ORAL | 3 refills | Status: DC

## 2022-04-10 ENCOUNTER — Other Ambulatory Visit (HOSPITAL_COMMUNITY): Payer: Self-pay

## 2022-04-10 NOTE — Telephone Encounter (Addendum)
Advanced Heart Failure Patient Advocate Encounter  POI is needed for [employer] to make a determination.  Left voicemail for patient.  Will follow up on or after 05/05/22

## 2022-04-20 NOTE — Telephone Encounter (Signed)
Advanced Heart Failure Patient Advocate Encounter  Spoke to patient on the phone, he will bring in POI for self and wife tomorrow (04/21/22). Patient provided with 2 weeks of samples while awaiting determination from [employer].  Medication Samples have been left at registration desk for patient pick up. Drug name: Sherryll Burger 49-51MG  Qty: 2x (28 ct) package LOT: [id number] Exp.: 01/2024 SIG: Take 2 tablets by mouth twice daily   The patient has been instructed regarding the correct time, dose, and frequency of taking this medication, including desired effects and most common side effects.

## 2022-05-01 ENCOUNTER — Other Ambulatory Visit (HOSPITAL_COMMUNITY): Payer: Self-pay

## 2022-05-04 ENCOUNTER — Other Ambulatory Visit (HOSPITAL_COMMUNITY): Payer: Self-pay

## 2022-05-05 ENCOUNTER — Telehealth (HOSPITAL_COMMUNITY): Payer: Self-pay

## 2022-05-05 ENCOUNTER — Other Ambulatory Visit (HOSPITAL_COMMUNITY): Payer: Self-pay

## 2022-05-05 NOTE — Telephone Encounter (Signed)
Advanced Heart Failure Patient Advocate Encounter  As of 05/05/2022, this patient has active coverage and will no longer need assistance for this medication.  Insurance added to [employer]; Test claim returns $0 copay.

## 2022-05-05 NOTE — Telephone Encounter (Signed)
Advanced Heart Failure Patient Advocate Encounter   Prior authorization is required for Entresto. PA submitted and APPROVED on 05/05/2022.  Key [id number] Effective: 04/21/2022 - 05/05/2023  Burnell Blanks, CPhT Rx Patient Advocate Phone: [phone]

## 2022-05-05 NOTE — Telephone Encounter (Signed)
Advanced Heart Failure Patient Advocate Encounter  As of 05/05/2022, this patient has active Medicaid coverage and this will effect the patients needs for medication assistance.  Coverage information has been added to [facility].  Stephaine H, CPhT Rx Patient Advocate Phone: [phone] 

## 2022-05-08 ENCOUNTER — Other Ambulatory Visit (HOSPITAL_COMMUNITY): Payer: Self-pay

## 2022-05-08 ENCOUNTER — Telehealth (HOSPITAL_COMMUNITY): Payer: Self-pay | Admitting: Licensed Clinical Social Worker

## 2022-05-08 MED ORDER — EMPAGLIFLOZIN 10 MG PO TABS: 10.0000 mg | ORAL_TABLET | Freq: Every day | ORAL | 3 refills | Status: DC

## 2022-05-08 NOTE — Telephone Encounter (Signed)
CSW reviewed pt who has been utilizing HF fund to see if they now had Medicaid following Medicaid expansion taking affect on 12/1.  Confirmed pt now has full Medicaid.  Called pt who confirms they are aware and will plan to provide their insurance information to the pharmacy and bring to future MD appts.  CSW mailed out flyer to pt on how to transition their care now that they have insurance.  Will continue to follow and assist as needed  Burna Sis, LCSW Clinical Social Worker Advanced [facility] Failure Clinic Desk#: [phone] Cell#: [phone]

## 2022-05-09 ENCOUNTER — Other Ambulatory Visit (HOSPITAL_COMMUNITY): Payer: Self-pay

## 2022-05-25 ENCOUNTER — Telehealth (HOSPITAL_COMMUNITY): Payer: Self-pay

## 2022-05-25 ENCOUNTER — Other Ambulatory Visit (HOSPITAL_COMMUNITY): Payer: Self-pay

## 2022-05-25 MED ORDER — DIGOXIN 125 MCG PO TABS: 0.0625 mg | ORAL_TABLET | Freq: Every day | ORAL | 11 refills | Status: DC

## 2022-05-25 NOTE — Telephone Encounter (Signed)
error 

## 2022-06-01 ENCOUNTER — Other Ambulatory Visit (HOSPITAL_COMMUNITY): Payer: Self-pay | Admitting: Family Medicine

## 2022-06-02 ENCOUNTER — Other Ambulatory Visit (HOSPITAL_COMMUNITY): Payer: Self-pay | Admitting: Family Medicine

## 2022-06-07 ENCOUNTER — Other Ambulatory Visit (HOSPITAL_COMMUNITY): Payer: Self-pay

## 2022-06-07 MED ORDER — ENTRESTO 97-103 MG PO TABS: 1.0000 | ORAL_TABLET | Freq: Two times a day (BID) | ORAL | 3 refills | Status: DC

## 2022-06-12 NOTE — Progress Notes (Incomplete)
PCP: Pcp, No Cardiology: Dr. Shirlee Latch  Mr Kyle Peck is a 62 y.o. with a history of arthritis, ETOH abuse, tobacco abuse, THC use, and cocaine abuse.  Presented to [facility] on 10/21/20 with progressive shortness of breath and fatigue. CXR with pulmonary edema. BNP 2252. Echo showed severely reduced EF 15% and mildly reduced RV. IV Lasix started.  He developed lethargy with respiratory failure, suspect ETOH withdrawal. Required intubation 10/24/20. Became hypotensive requiring norepinephrine and vasopressin, he developed AKI and had CVVH transiently. He was found to have MRSA bacteremia, possibly from PNA.  TEE showed no endocarditis.  Eventually he had right and left heart cath, showing severe 3 vessel disease.  We recommended CABG but wanted MRI viability study first.  Patient decided to leave AMA.  He did get his medications prior to discharge and consented to wear a Lifevest.    He subsequently saw Dr. Laneta Simmers in the office, was not thought to have good targets for CABG.  Given lack of chest pain, CABG was not recommended.   Echo 2/23 showed EF 25-30%, severe LV dilation with wall motion abnormalities, normal RV, mild MR. Referred to EP for ICD but does not have insurance coverage [facility]).  Today he returns for HF follow up. He is out of [place], otherwise taking all his meds.  Weight up 3 lbs. Not exercising due to knee pain, this is his main limitation. No significant exertional dyspnea.  No orthopnea/PND.  No chest pain.  No smoking, cocaine, or ETOH.    ECG (personally reviewed): NSR, PACs, old ASMI  Labs (6/22): K 4, creatinine 0.73 Labs (11/22): K 3.9, creatinine 0.87, digoxin < 0.2 Labs (5/23): K 4.0, creatinine 1.00  PMH: 1. ETOH abuse.  2. Smoking 3. Prior cocaine use.  4. MRSA bacteremia 5/22 - TEE (5/22): no endocarditis.  5. CAD: LHC (5/22) with totally occluded pLAD with collaterals, totally occluded ramus with collaterals, 70% proximal RCA, PDA totally occluded with  collaterals. CABG recommended.  6. Chronic systolic CHF: Ischemic cardiomyopathy.  Echo (5/22) with EF 15%, moderate LV enlargement, moderate RV enlargement with mildly decreased RV systolic function, mild MR.  - RHC (5/22): mean RA 3, PA 26/4, mean PCWP 5, CI 2.76 Fick, CI 3.01 thermo.  - Echo (3/23): EF 25-30%, severe LV dilation with wall motion abnormalities, normal RV, mild MR.  7. BPH 8. Knee OA 9. HTN  Social History   Socioeconomic History   Marital status: Married    Spouse name: Eber Jones   Number of children: Not on file   Years of education: Not on file   Highest education level: Not on file  Occupational History   Not on file  Tobacco Use   Smoking status: Former    Packs/day: 0.50    Types: Cigars, Cigarettes   Smokeless tobacco: Never  Vaping Use   Vaping Use: Never used  Substance and Sexual Activity   Alcohol use: Yes    Alcohol/week: 15.0 standard drinks of alcohol    Types: 15 Shots of liquor per week    Comment: intermittently   Drug use: Yes    Frequency: 2.0 times per week    Types: Marijuana    Comment: "also around cocaine before"   Sexual activity: Not on file  Other Topics Concern   Not on file  Social History Narrative   Not on file   Social Determinants of Health   Financial Resource Strain: High Risk (03/13/2022)   Overall Financial Resource Strain (CARDIA)  Difficulty of Paying Living Expenses: Very hard  Food Insecurity: Food Insecurity Present (03/13/2022)   Hunger Vital Sign    Worried About Running Out of Food in the Last Year: Sometimes true    Ran Out of Food in the Last Year: Sometimes true  Transportation Needs: Unmet Transportation Needs (03/13/2022)   PRAPARE - Hydrologist (Medical): Yes    Lack of Transportation (Non-Medical): Yes  Physical Activity: Not on file  Stress: Not on file  Social Connections: Not on file  Intimate Partner Violence: Not on file   Family History  Problem Relation Age  of Onset   Congestive Heart Failure Mother    Congestive Heart Failure Father    ROS: All systems reviewed and negative except as per HPI.   Current Outpatient Medications  Medication Sig Dispense Refill   aspirin 81 MG chewable tablet Chew 1 tablet (81 mg total) by mouth daily. 30 tablet 11   atorvastatin (LIPITOR) 40 MG tablet Take 1 tablet (40 mg total) by mouth daily. 30 tablet 3   carvedilol (COREG) 12.5 MG tablet Take 1 tablet (12.5 mg total) by mouth 2 (two) times daily. 180 tablet 3   digoxin (LANOXIN) 0.125 MG tablet Take 0.5 tablets (0.0625 mg total) by mouth daily. 15 tablet 11   empagliflozin (JARDIANCE) 10 MG TABS tablet Take 1 tablet (10 mg total) by mouth daily before breakfast. 30 tablet 3   eplerenone (INSPRA) 50 MG tablet Take 1 tablet (50 mg total) by mouth daily. 30 tablet 11   famotidine (PEPCID) 20 MG tablet Take 1 tablet (20 mg total) by mouth daily. 30 tablet 0   furosemide (LASIX) 20 MG tablet Take 1 tablet (20 mg total) by mouth daily as needed. 30 tablet 6   sacubitril-valsartan (ENTRESTO) 97-103 MG Take 1 tablet by mouth 2 (two) times daily. 180 tablet 3   tamsulosin (FLOMAX) 0.4 MG CAPS capsule Take 1 capsule (0.4 mg total) by mouth daily after supper. 30 capsule 0   traZODone (DESYREL) 100 MG tablet Take 1 tablet (100 mg total) by mouth at bedtime as needed for sleep. 30 tablet 3   No current facility-administered medications for this visit.   Wt Readings from Last 3 Encounters:  03/13/22 99.6 kg (219 lb 9.6 oz)  10/12/21 98.2 kg (216 lb 9.6 oz)  10/03/21 96.2 kg (212 lb)   There were no vitals taken for this visit. General: NAD Neck: No JVD, no thyromegaly or thyroid nodule.  Lungs: Clear to auscultation bilaterally with normal respiratory effort. CV: Nondisplaced PMI.  Heart regular S1/S2, no S3/S4, no murmur.  No peripheral edema.  No carotid bruit.  Normal pedal pulses.  Abdomen: Soft, nontender, no hepatosplenomegaly, no distention.  Skin: Intact  without lesions or rashes.  Neurologic: Alert and oriented x 3.  Psych: Normal affect. Extremities: No clubbing or cyanosis.  HEENT: Normal.   Assessment/Plan: 1. 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  Echo in 5/22 with EF 15%, moderate LV dilation, mildly decreased RV function, mild MR. Ischemic cardiomyopathy based on 6/22 cath. [place] in 5/22 showed normal cardiac index.  Echo 2/23 with EF 25-30%, severe LV dilation with wall motion abnormalities, normal RV, mild MR.  He is not volume overloaded on exam, NYHA class I-II.  - Continue digoxin 0.0625 mg daily. Check level today. - Increase Entresto to 97/103 bid, BMET/BNP today and BMET in 10 days.  - Continue Coreg 12.5 mg bid.  - Continue eplerenone  50 mg daily (painful gynecomastia with spironolactone).   - Continue Farxiga 10 mg daily.  - He can take Lasix prn.  - He has been referred to EP for recommended ICD.  Not CRT candidate with narrow QRS.  However, needs to get better insurance (has Medicaid Family Planning, ICD not covered). He is working with our Child psychotherapist to get regular Medicaid.  - Needs to stay off ETOH.  2. ETOH abuse: He has not had anything to drink since leaving the hospital. This may have contributed to his cardiomyopathy.  - I urged that he remain abstinent.  3. Cocaine abuse: This was rare and he has not used since discharge.  4. MRSA bacteremia: ?PNA source.  TEE 5/22 showed no endocarditis. 5 CAD: Cath 6/22 with occluded ostial PDA, mid ramus, and proximal LAD, all with collaterals.  No chest pain, presentation in 5/22 with CHF.  No recent chest pain.  - Continue ASA 81 and statin. Check lipids today.  - He saw Dr. Laneta Simmers.  Given lack of chest pain and poor targets for CABG, CABG was not recommended.  Continue medical management.  6. Knee OA: Significantly limiting.  I will refer to Dr. August Saucer with [facility], would be candidate for steroid injection most likely though TKR  would be high risk.   Follow up in 3 wks with HF pharmacist for med titration, see APP in 2 months.   Anderson [facility]  06/12/2022

## 2022-06-13 ENCOUNTER — Other Ambulatory Visit (HOSPITAL_COMMUNITY): Payer: Self-pay | Admitting: Cardiology

## 2022-06-13 ENCOUNTER — Encounter (HOSPITAL_COMMUNITY): Payer: Medicaid Other

## 2022-06-13 ENCOUNTER — Other Ambulatory Visit (HOSPITAL_COMMUNITY): Payer: Self-pay

## 2022-06-13 MED ORDER — FUROSEMIDE 20 MG PO TABS
20.0000 mg | ORAL_TABLET | Freq: Every day | ORAL | 6 refills | Status: DC | PRN
  Filled 2022-06-13 (×2): qty 30, 30d supply, fill #0
  Filled 2022-07-17: qty 30, 30d supply, fill #1

## 2022-06-14 ENCOUNTER — Other Ambulatory Visit: Payer: Self-pay

## 2022-06-23 ENCOUNTER — Other Ambulatory Visit (HOSPITAL_COMMUNITY): Payer: Self-pay

## 2022-06-29 ENCOUNTER — Encounter (HOSPITAL_COMMUNITY): Payer: Self-pay

## 2022-07-17 ENCOUNTER — Other Ambulatory Visit (HOSPITAL_COMMUNITY): Payer: Self-pay | Admitting: Cardiology

## 2022-07-17 ENCOUNTER — Other Ambulatory Visit (HOSPITAL_COMMUNITY): Payer: Self-pay

## 2022-07-17 MED ORDER — ATORVASTATIN CALCIUM 40 MG PO TABS
40.0000 mg | ORAL_TABLET | Freq: Every day | ORAL | 3 refills | Status: DC
  Filled 2022-07-17: qty 30, 30d supply, fill #0

## 2022-08-21 ENCOUNTER — Other Ambulatory Visit (HOSPITAL_COMMUNITY): Payer: Self-pay

## 2022-08-22 ENCOUNTER — Other Ambulatory Visit (HOSPITAL_COMMUNITY): Payer: Self-pay | Admitting: Cardiology

## 2022-08-22 MED ORDER — EPLERENONE 50 MG PO TABS: 50.0000 mg | ORAL_TABLET | Freq: Every day | ORAL | 11 refills | Status: DC

## 2022-08-22 MED ORDER — EMPAGLIFLOZIN 10 MG PO TABS: 10.0000 mg | ORAL_TABLET | Freq: Every day | ORAL | 11 refills | Status: DC

## 2022-08-23 ENCOUNTER — Other Ambulatory Visit (HOSPITAL_COMMUNITY): Payer: Self-pay

## 2022-08-28 ENCOUNTER — Other Ambulatory Visit (HOSPITAL_COMMUNITY): Payer: Self-pay | Admitting: Cardiology

## 2022-08-28 DIAGNOSIS — I5022 Chronic systolic (congestive) heart failure: Secondary | ICD-10-CM

## 2022-09-05 ENCOUNTER — Other Ambulatory Visit (HOSPITAL_COMMUNITY): Payer: Self-pay

## 2022-09-05 ENCOUNTER — Telehealth (HOSPITAL_COMMUNITY): Payer: Self-pay

## 2022-09-05 NOTE — Telephone Encounter (Signed)
Patient Advocate Encounter  Prior authorization for Jardiance submitted and APPROVED. Test billing returns $4 copay for 90 day supply.  Key [id number] Effective: 09/05/2022 - 09/05/2023  Confirmed with pharmacy and patient by phone.  Clista Bernhardt, CPhT Rx Patient Advocate Phone: [phone]

## 2022-09-12 ENCOUNTER — Other Ambulatory Visit (HOSPITAL_COMMUNITY): Payer: Self-pay

## 2022-09-25 ENCOUNTER — Other Ambulatory Visit (HOSPITAL_COMMUNITY): Payer: Self-pay

## 2022-10-31 NOTE — Progress Notes (Signed)
PCP: Pcp, No Cardiology: Dr. Shirlee Latch  Kyle Peck is a 62 y.o. with a history of arthritis, ETOH abuse, tobacco abuse, THC use, and cocaine abuse.  Presented to [facility] on 10/21/20 with progressive shortness of breath and fatigue. CXR with pulmonary edema. BNP 2252. Echo showed severely reduced EF 15% and mildly reduced RV. IV Lasix started.  He developed lethargy with respiratory failure, suspect ETOH withdrawal. Required intubation 10/24/20. Became hypotensive requiring norepinephrine and vasopressin, he developed AKI and had CVVH transiently. He was found to have MRSA bacteremia, possibly from PNA.  TEE showed no endocarditis.  Eventually he had right and left heart cath, showing severe 3 vessel disease.  We recommended CABG but wanted MRI viability study first.  Patient decided to leave AMA.  He did get his medications prior to discharge and consented to wear a Lifevest.    He subsequently saw Dr. Laneta Simmers in the office, was not thought to have good targets for CABG.  Given lack of chest pain, CABG was not recommended.   Echo 2/23 showed EF 25-30%, severe LV dilation with wall motion abnormalities, normal RV, mild Kyle. Referred to EP for ICD but does not have insurance coverage [facility] Family Planning).  Follow up 10/23, stable NYHA I-II symptoms. Working with HFSW to [employer] for ICD.   Today he returns for HF follow up. Overall feeling fine. He is not short of breath walking up steps. Knee OA is major limiter. He has seen Ortho and planning gel injections. Asking about pain medications. Denies palpitations, abnormal bleeding, CP, dizziness, edema, or PND/Orthopnea. Appetite ok. No fever or chills. Weight at home 210 pounds. Taking all medications. No smoking, cocaine, or ETOH. Smokes occasional THC. Does not need Lasix regularly. Wife arranges pillbox.  ECG (personally reviewed): SR 1AVB PR 228 msec, QRS 100   Labs (6/22): K 4, creatinine 0.73 Labs (11/22): K 3.9, creatinine 0.87, digoxin <  0.2 Labs (5/23): K 4.0, creatinine 1.00 Labs (10/23): K 4.7, creatinine 0.81, LDL 67  PMH: 1. ETOH abuse.  2. Smoking 3. Prior cocaine use.  4. MRSA bacteremia 5/22 - TEE (5/22): no endocarditis.  5. CAD: LHC (5/22) with totally occluded pLAD with collaterals, totally occluded ramus with collaterals, 70% proximal RCA, PDA totally occluded with collaterals. CABG recommended.  6. Chronic systolic CHF: Ischemic cardiomyopathy.  Echo (5/22) with EF 15%, moderate LV enlargement, moderate RV enlargement with mildly decreased RV systolic function, mild Kyle.  - RHC (5/22): mean RA 3, PA 26/4, mean PCWP 5, CI 2.76 Fick, CI 3.01 thermo.  - Echo (3/23): EF 25-30%, severe LV dilation with wall motion abnormalities, normal RV, mild Kyle.  7. BPH 8. Knee OA 9. HTN  Social History   Socioeconomic History   Marital status: Married    Spouse name: Eber Jones   Number of children: Not on file   Years of education: Not on file   Highest education level: Not on file  Occupational History   Not on file  Tobacco Use   Smoking status: Former    Packs/day: .5    Types: Cigars, Cigarettes   Smokeless tobacco: Never  Vaping Use   Vaping Use: Never used  Substance and Sexual Activity   Alcohol use: Yes    Alcohol/week: 15.0 standard drinks of alcohol    Types: 15 Shots of liquor per week    Comment: intermittently   Drug use: Yes    Frequency: 2.0 times per week    Types: Marijuana    Comment: "  also around cocaine before"   Sexual activity: Not on file  Other Topics Concern   Not on file  Social History Narrative   Not on file   Social Determinants of Health   Financial Resource Strain: High Risk (03/13/2022)   Overall Financial Resource Strain (CARDIA)    Difficulty of Paying Living Expenses: Very hard  Food Insecurity: Food Insecurity Present (03/13/2022)   Hunger Vital Sign    Worried About Running Out of Food in the Last Year: Sometimes true    Ran Out of Food in the Last Year: Sometimes  true  Transportation Needs: Unmet Transportation Needs (03/13/2022)   PRAPARE - Administrator, Civil Service (Medical): Yes    Lack of Transportation (Non-Medical): Yes  Physical Activity: Not on file  Stress: Not on file  Social Connections: Not on file  Intimate Partner Violence: Not on file   Family History  Problem Relation Age of Onset   Congestive Heart Failure Mother    Congestive Heart Failure Father    ROS: All systems reviewed and negative except as per HPI.   Current Outpatient Medications  Medication Sig Dispense Refill   aspirin 81 MG chewable tablet Chew 1 tablet (81 mg total) by mouth daily. 30 tablet 11   atorvastatin (LIPITOR) 40 MG tablet Take 1 tablet (40 mg total) by mouth daily. 30 tablet 3   carvedilol (COREG) 12.5 MG tablet Take 1 tablet (12.5 mg total) by mouth 2 (two) times daily. 180 tablet 3   digoxin (LANOXIN) 0.125 MG tablet Take 0.5 tablets (0.0625 mg total) by mouth daily. 15 tablet 11   empagliflozin (JARDIANCE) 10 MG TABS tablet Take 1 tablet (10 mg total) by mouth daily before breakfast. 30 tablet 11   eplerenone (INSPRA) 50 MG tablet Take 1 tablet (50 mg total) by mouth daily. 30 tablet 11   furosemide (LASIX) 20 MG tablet Take 1 tablet (20 mg total) by mouth daily as needed. 30 tablet 6   Omeprazole Magnesium (PRILOSEC PO) Take by mouth daily.     sacubitril-valsartan (ENTRESTO) 97-103 MG Take 1 tablet by mouth 2 (two) times daily. 180 tablet 3   tamsulosin (FLOMAX) 0.4 MG CAPS capsule Take 1 capsule (0.4 mg total) by mouth daily after supper. 30 capsule 0   traZODone (DESYREL) 100 MG tablet Take 1 tablet (100 mg total) by mouth at bedtime as needed for sleep. 30 tablet 3   No current facility-administered medications for this encounter.   Wt Readings from Last 3 Encounters:  11/02/22 96.3 kg (212 lb 3.2 oz)  03/13/22 99.6 kg (219 lb 9.6 oz)  10/12/21 98.2 kg (216 lb 9.6 oz)   BP 110/72   Pulse 73   Wt 96.3 kg (212 lb 3.2 oz)    SpO2 97%   BMI 31.34 kg/m  Physical Exam General:  NAD. No resp difficulty HEENT: Normal Neck: Supple. No JVD. Carotids 2+ bilat; no bruits. No lymphadenopathy or thryomegaly appreciated. Cor: PMI nondisplaced. Regular rate & rhythm. No rubs, gallops or murmurs. Lungs: Clear Abdomen: Soft, nontender, nondistended. No hepatosplenomegaly. No bruits or masses. Good bowel sounds. Extremities: No cyanosis, clubbing, rash, edema Neuro: Alert & oriented x 3, cranial nerves grossly intact. Moves all 4 extremities w/o difficulty. Affect pleasant.  Assessment/Plan: 1. 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  Echo in 5/22 with EF 15%, moderate LV dilation, mildly decreased RV function, mild Kyle. Ischemic cardiomyopathy based on 6/22 cath. RHC in 5/22 showed normal cardiac index.  Echo 2/23 with EF 25-30%, severe LV dilation with wall motion abnormalities, normal RV, mild Kyle.  He is not volume overloaded on exam, NYHA class I-II.  - Continue digoxin 0.0625 mg daily. Check level today. - Continue Entresto 97/103 bid, BMET today. - Continue Coreg 12.5 mg bid.  - Continue eplerenone 50 mg daily (painful gynecomastia with spironolactone).   - Continue Jardiance 10 mg daily.  - He can take Lasix prn.  - He had been referred to EP for recommended ICD, but needed to get better insurance (previously had Medicaid Family Planning). - Now has full Medicaid, discussed referral back to EP for ICD consideration. But will update echo first before re-referring as it has been > 1 year. Not CRT candidate with narrow QRS.   - Needs to stay off ETOH.  2. ETOH abuse: He has not had anything to drink since leaving the hospital in 2022. This may have contributed to his cardiomyopathy.  - I urged that he remain abstinent.  3. Cocaine abuse: This was rare and he has not used since 2022.  4. MRSA bacteremia: ?PNA source.  TEE 5/22 showed no endocarditis. 5 CAD: Cath 6/22 with occluded  ostial PDA, mid ramus, and proximal LAD, all with collaterals.  No chest pain, presentation in 5/22 with CHF.  No recent chest pain.  - Continue ASA 81 and statin. LDL 67 (10/23) - He saw Dr. Laneta Simmers.  Given lack of chest pain and poor targets for CABG, CABG was not recommended.  Continue medical management.  6. Knee OA: Significantly limiting. Has seen Dr. August Saucer with [facility], he is a candidate for steroid injection most likely though TKR would be high risk.   Follow up in 3 months with Dr. Shirlee Latch. Update echo soon, if EF remains < 35% will then refer to EP for ICD.  Anderson Malta [facility] FNP-BC 11/02/2022

## 2022-11-01 ENCOUNTER — Telehealth (HOSPITAL_COMMUNITY): Payer: Self-pay

## 2022-11-01 NOTE — Telephone Encounter (Signed)
Called to confirm/remind patient of their appointment at the [facility] on 11/02/22.   Patient reminded to bring all medications and/or complete list.  Confirmed patient has transportation. Gave directions, instructed to utilize valet parking.  Confirmed appointment prior to ending call.

## 2022-11-02 ENCOUNTER — Ambulatory Visit (HOSPITAL_COMMUNITY)
Admission: RE | Admit: 2022-11-02 | Discharge: 2022-11-02 | Disposition: A | Discharge status: Home / Self Care | Payer: Medicaid Other | Source: Ambulatory Visit | Attending: Family Medicine | Admitting: Family Medicine

## 2022-11-02 ENCOUNTER — Encounter (HOSPITAL_COMMUNITY): Payer: Self-pay

## 2022-11-02 VITALS — BP 110/72 | HR 73 | Wt 212.2 lb

## 2022-11-02 DIAGNOSIS — Z7984 Long term (current) use of oral hypoglycemic drugs: Secondary | ICD-10-CM | POA: Insufficient documentation

## 2022-11-02 DIAGNOSIS — F1010 Alcohol abuse, uncomplicated: Secondary | ICD-10-CM | POA: Diagnosis not present

## 2022-11-02 DIAGNOSIS — F1410 Cocaine abuse, uncomplicated: Secondary | ICD-10-CM | POA: Diagnosis not present

## 2022-11-02 DIAGNOSIS — M170 Bilateral primary osteoarthritis of knee: Secondary | ICD-10-CM

## 2022-11-02 DIAGNOSIS — I2510 Atherosclerotic heart disease of native coronary artery without angina pectoris: Secondary | ICD-10-CM | POA: Insufficient documentation

## 2022-11-02 DIAGNOSIS — Z79899 Other long term (current) drug therapy: Secondary | ICD-10-CM | POA: Insufficient documentation

## 2022-11-02 DIAGNOSIS — I5022 Chronic systolic (congestive) heart failure: Secondary | ICD-10-CM | POA: Insufficient documentation

## 2022-11-02 DIAGNOSIS — Z8614 Personal history of Methicillin resistant Staphylococcus aureus infection: Secondary | ICD-10-CM | POA: Diagnosis not present

## 2022-11-02 DIAGNOSIS — J189 Pneumonia, unspecified organism: Secondary | ICD-10-CM | POA: Diagnosis not present

## 2022-11-02 DIAGNOSIS — F1011 Alcohol abuse, in remission: Secondary | ICD-10-CM

## 2022-11-02 DIAGNOSIS — N62 Hypertrophy of breast: Secondary | ICD-10-CM | POA: Diagnosis not present

## 2022-11-02 DIAGNOSIS — M179 Osteoarthritis of knee, unspecified: Secondary | ICD-10-CM | POA: Insufficient documentation

## 2022-11-02 DIAGNOSIS — I255 Ischemic cardiomyopathy: Secondary | ICD-10-CM | POA: Insufficient documentation

## 2022-11-02 DIAGNOSIS — F1411 Cocaine abuse, in remission: Secondary | ICD-10-CM

## 2022-11-02 DIAGNOSIS — I110 Hypertensive heart disease with heart failure: Secondary | ICD-10-CM | POA: Diagnosis present

## 2022-11-02 LAB — DIGOXIN LEVEL: Digoxin Level: 0.4 ng/mL — ABNORMAL LOW (ref 0.8–2.0)

## 2022-11-02 LAB — BASIC METABOLIC PANEL
Anion gap: 10 (ref 5–15)
BUN: 17 mg/dL (ref 8–23)
CO2: 24 mmol/L (ref 22–32)
Calcium: 8.6 mg/dL — ABNORMAL LOW (ref 8.9–10.3)
Chloride: 100 mmol/L (ref 98–111)
Creatinine, Ser: 1.08 mg/dL (ref 0.61–1.24)
GFR, Estimated: >60 mL/min (ref >60)
Glucose, Bld: 129 mg/dL — ABNORMAL HIGH (ref 70–99)
Potassium: 4.5 mmol/L (ref 3.5–5.1)
Sodium: 134 mmol/L — ABNORMAL LOW (ref 135–145)

## 2022-11-02 NOTE — Patient Instructions (Addendum)
Thank you for coming in today  If you had labs drawn today, any labs that are abnormal the clinic will call you No news is good news  [facility] Dr. August Saucer [phone]  Medications: No changes  Follow up appointments: Your physician has requested that you have an echocardiogram. Echocardiography is a painless test that uses sound waves to create images of your heart. It provides your doctor with information about the size and shape of your heart and how well your heart's chambers and valves are working. This procedure takes approximately one hour. There are no restrictions for this procedure. The [facility] office will call and schedule this echocardiogram for you    Your physician recommends that you schedule a follow-up appointment in:  3 months With Dr. Shirlee Latch     Do the following things EVERYDAY: Weigh yourself in the morning before breakfast. Write it down and keep it in a log. Take your medicines as prescribed Eat low salt foods--Limit salt (sodium) to 2000 mg per day.  Stay as active as you can everyday Limit all fluids for the day to less than 2 liters   At the [facility], you and your health needs are our priority. As part of our continuing mission to provide you with exceptional heart care, we have created designated Provider Care Teams. These Care Teams include your primary Cardiologist (physician) and Advanced Practice Providers (APPs- Physician Assistants and Nurse Practitioners) who all work together to provide you with the care you need, when you need it.   You may see any of the following providers on your designated Care Team at your next follow up: Dr Arvilla Meres Dr Marca Ancona Dr. Marcos Eke, NP Robbie Lis, [place] [facility] [place], [place] Brynda Peon, NP Karle Plumber, PharmD   Please be sure to bring in all your medications bottles to every appointment.    Thank you for choosing [facility]  If you have any questions or concerns before your next appointment please send [place] a message through [place] or call our office at [phone].    TO LEAVE A MESSAGE FOR THE NURSE SELECT OPTION 2, PLEASE LEAVE A MESSAGE INCLUDING: YOUR NAME DATE OF BIRTH CALL BACK NUMBER REASON FOR CALL**this is important as we prioritize the call backs  YOU WILL RECEIVE A CALL BACK THE SAME DAY AS LONG AS YOU CALL BEFORE 4:00 PM

## 2022-11-27 ENCOUNTER — Other Ambulatory Visit: Payer: Self-pay

## 2022-11-27 ENCOUNTER — Other Ambulatory Visit (HOSPITAL_COMMUNITY): Payer: Self-pay

## 2022-11-27 MED ORDER — ATORVASTATIN CALCIUM 40 MG PO TABS
40.0000 mg | ORAL_TABLET | Freq: Every day | ORAL | 0 refills | Status: DC
  Filled 2022-11-27: qty 90, 90d supply, fill #0

## 2022-12-01 ENCOUNTER — Other Ambulatory Visit (HOSPITAL_COMMUNITY): Payer: Self-pay

## 2022-12-01 ENCOUNTER — Other Ambulatory Visit (HOSPITAL_COMMUNITY): Payer: Self-pay | Admitting: Cardiology

## 2022-12-01 MED ORDER — OMEPRAZOLE 20 MG PO CPDR: 20.0000 mg | DELAYED_RELEASE_CAPSULE | Freq: Every day | ORAL | 0 refills | Status: DC

## 2022-12-01 MED ORDER — FUROSEMIDE 20 MG PO TABS: 20.0000 mg | ORAL_TABLET | Freq: Every day | ORAL | 3 refills | Status: DC | PRN

## 2022-12-01 MED ORDER — EMPAGLIFLOZIN 10 MG PO TABS: 10.0000 mg | ORAL_TABLET | Freq: Every day | ORAL | 3 refills | Status: DC

## 2022-12-01 MED ORDER — ENTRESTO 97-103 MG PO TABS: 1.0000 | ORAL_TABLET | Freq: Two times a day (BID) | ORAL | 3 refills | Status: DC

## 2022-12-01 MED ORDER — ATORVASTATIN CALCIUM 40 MG PO TABS: 40.0000 mg | ORAL_TABLET | Freq: Every day | ORAL | 3 refills | Status: DC

## 2022-12-01 MED ORDER — CARVEDILOL 12.5 MG PO TABS: 12.5000 mg | ORAL_TABLET | Freq: Two times a day (BID) | ORAL | 3 refills | Status: DC

## 2022-12-01 MED ORDER — EPLERENONE 50 MG PO TABS: 50.0000 mg | ORAL_TABLET | Freq: Every day | ORAL | 3 refills | Status: DC

## 2022-12-01 MED ORDER — DIGOXIN 125 MCG PO TABS: 0.0625 mg | ORAL_TABLET | Freq: Every day | ORAL | 3 refills | Status: DC

## 2022-12-01 NOTE — Telephone Encounter (Signed)
Pt called to report issues with medications Reports pts meds were at [facility] Changed to Kyle Peck-which has sent to [facility] since [facility] went out of business   Ironed out all meds as requested Aware omeprazole is a courtesy fill-additional refills will come from PCP

## 2023-01-03 ENCOUNTER — Ambulatory Visit: Payer: Medicaid Other | Admitting: Orthopedic Surgery

## 2023-01-04 ENCOUNTER — Other Ambulatory Visit (HOSPITAL_COMMUNITY): Payer: Self-pay | Admitting: Cardiology

## 2023-01-11 ENCOUNTER — Telehealth: Payer: Self-pay

## 2023-01-11 NOTE — Telephone Encounter (Signed)
Talked with patient concerning gel injections and advised him that medicaid does not cover any gel injections.  Patient voiced that he understands.  Did offer TriVisc, but patient declined due to his finances.

## 2023-01-29 ENCOUNTER — Ambulatory Visit: Payer: Medicaid Other | Admitting: Orthopedic Surgery

## 2023-02-07 ENCOUNTER — Encounter (HOSPITAL_COMMUNITY): Payer: Medicaid Other | Admitting: Cardiology

## 2023-03-29 ENCOUNTER — Telehealth (HOSPITAL_COMMUNITY): Payer: Self-pay | Admitting: Cardiology

## 2023-03-29 ENCOUNTER — Telehealth: Payer: Self-pay

## 2023-03-29 DIAGNOSIS — Z0000 Encounter for general adult medical examination without abnormal findings: Secondary | ICD-10-CM

## 2023-03-29 NOTE — Telephone Encounter (Signed)
Called patient to confirm arrangement of transportation for appointment at [facility] tomorrow at 11:40am. Patient did not answer. Was not able to leave a message. Called emergency contacts. Patient's spouse did not have a voicemail. Left message on daughter's phone to have patient return call.    Renaee Munda, MS, ERHD, [facility]  Care Guide, Health & Wellness Coach [address] [place] [address] Telephone: [phone] Email: [email or web address]

## 2023-03-29 NOTE — Telephone Encounter (Signed)
Called patient to confirm pick up time of 10:45am for taxi ride to appointment tomorrow and to get verbal rider waiver and release of liability.   03/29/2023  Kyle Peck DOB: August 23, 1960 MRN: [id number]   RIDER WAIVER AND RELEASE OF LIABILITY  For the purposes of helping with transportation needs, [place] partners with outside transportation providers (taxi companies, [place], Catering manager.) to give [employer] patients or other approved people the choice of on-demand rides [employer]") to our buildings for non-emergency visits.  By using [employer], I, the person signing this document, on behalf of myself and/or any legal minors (in my care using the [employer]), agree:  Science writer given to me are supplied by independent, outside transportation providers who do not work for, or have any affiliation with, [employer]. [employer] is not a transportation company. [place] has no control over the quality or safety of the rides I get using [employer]. [place] has no control over whether any outside ride will happen on time or not. [place] gives no guarantee on the reliability, quality, safety, or availability on any rides, or that no mistakes will happen. I know and accept that traveling by vehicle (car, truck, SVU, Zenaida Niece, bus, taxi, etc.) has risks of serious injuries such as disability, being paralyzed, and death. I know and agree the risk of using [employer] is mine alone, and not [employer]. Transport Services are provided "as is" and as are available. The transportation providers are in charge for all inspections and care of the vehicles used to provide these rides. I agree not to take legal action against [place], its agents, employees, officers, directors, representatives, insurers, attorneys, assigns, successors, subsidiaries, and affiliates at any time for any reasons related directly or indirectly to using [employer]. I  also agree not to take legal action against [place] or its affiliates for any injury, death, or damage to property caused by or related to using [employer]. I have read this Waiver and Release of Liability, and I understand the terms used in it and their legal meaning. This Waiver is freely and voluntarily given with the understanding that my right (or any legal minors) to legal action against [place] relating to [employer] is knowingly given up to use these services.   I attest that I read the Ride Waiver and Release of Liability to Kyle Peck, gave Kyle Peck the opportunity to ask questions and answered the questions asked (if any). I affirm that Kyle Peck then provided consent for assistance with transportation.     Kyle Peck

## 2023-03-29 NOTE — Telephone Encounter (Signed)
Pt need transport asst to appt tomorrow @11am , please. Thanks

## 2023-03-29 NOTE — Telephone Encounter (Signed)
Patient's daughter returned call and provided a contact number for the patient.

## 2023-03-30 ENCOUNTER — Ambulatory Visit (HOSPITAL_COMMUNITY)
Admission: RE | Admit: 2023-03-30 | Discharge: 2023-03-30 | Disposition: A | Discharge status: Home / Self Care | Payer: Medicaid Other | Source: Ambulatory Visit | Attending: Cardiology | Admitting: Cardiology

## 2023-03-30 ENCOUNTER — Encounter (HOSPITAL_COMMUNITY): Payer: Self-pay | Admitting: Cardiology

## 2023-03-30 VITALS — BP 120/70 | HR 73 | Wt 207.6 lb

## 2023-03-30 DIAGNOSIS — I2510 Atherosclerotic heart disease of native coronary artery without angina pectoris: Secondary | ICD-10-CM | POA: Insufficient documentation

## 2023-03-30 DIAGNOSIS — I429 Cardiomyopathy, unspecified: Secondary | ICD-10-CM | POA: Diagnosis not present

## 2023-03-30 DIAGNOSIS — I110 Hypertensive heart disease with heart failure: Secondary | ICD-10-CM | POA: Insufficient documentation

## 2023-03-30 DIAGNOSIS — Z5982 Transportation insecurity: Secondary | ICD-10-CM | POA: Insufficient documentation

## 2023-03-30 DIAGNOSIS — I5022 Chronic systolic (congestive) heart failure: Secondary | ICD-10-CM | POA: Diagnosis present

## 2023-03-30 DIAGNOSIS — Z8249 Family history of ischemic heart disease and other diseases of the circulatory system: Secondary | ICD-10-CM | POA: Diagnosis not present

## 2023-03-30 DIAGNOSIS — I440 Atrioventricular block, first degree: Secondary | ICD-10-CM | POA: Insufficient documentation

## 2023-03-30 DIAGNOSIS — F1011 Alcohol abuse, in remission: Secondary | ICD-10-CM | POA: Diagnosis not present

## 2023-03-30 DIAGNOSIS — Z79899 Other long term (current) drug therapy: Secondary | ICD-10-CM | POA: Diagnosis not present

## 2023-03-30 DIAGNOSIS — Z87891 Personal history of nicotine dependence: Secondary | ICD-10-CM | POA: Insufficient documentation

## 2023-03-30 DIAGNOSIS — Z5941 Food insecurity: Secondary | ICD-10-CM | POA: Diagnosis not present

## 2023-03-30 DIAGNOSIS — Z7982 Long term (current) use of aspirin: Secondary | ICD-10-CM | POA: Diagnosis not present

## 2023-03-30 DIAGNOSIS — M179 Osteoarthritis of knee, unspecified: Secondary | ICD-10-CM | POA: Diagnosis not present

## 2023-03-30 DIAGNOSIS — Z8619 Personal history of other infectious and parasitic diseases: Secondary | ICD-10-CM | POA: Insufficient documentation

## 2023-03-30 DIAGNOSIS — Z5986 Financial insecurity: Secondary | ICD-10-CM | POA: Diagnosis not present

## 2023-03-30 LAB — LIPID PANEL
Cholesterol: 141 mg/dL (ref 0–200)
HDL: 32 mg/dL — ABNORMAL LOW (ref >40)
Total CHOL/HDL Ratio: 4.4 {ratio}
Triglycerides: 201 mg/dL — ABNORMAL HIGH (ref <150)
VLDL: 40 mg/dL (ref 0–40)

## 2023-03-30 LAB — BASIC METABOLIC PANEL
Anion gap: 10 (ref 5–15)
BUN: 13 mg/dL (ref 8–23)
CO2: 27 mmol/L (ref 22–32)
Calcium: 9.4 mg/dL (ref 8.9–10.3)
Chloride: 103 mmol/L (ref 98–111)
Creatinine, Ser: 0.98 mg/dL (ref 0.61–1.24)
GFR, Estimated: >60 mL/min (ref >60)
Glucose, Bld: 123 mg/dL — ABNORMAL HIGH (ref 70–99)
Potassium: 4 mmol/L (ref 3.5–5.1)
Sodium: 140 mmol/L (ref 135–145)

## 2023-03-30 LAB — DIGOXIN LEVEL: Digoxin Level: 0.4 ng/mL — ABNORMAL LOW (ref 0.8–2.0)

## 2023-03-30 MED ORDER — CARVEDILOL 12.5 MG PO TABS: 18.7500 mg | ORAL_TABLET | Freq: Two times a day (BID) | ORAL | 3 refills | Status: DC

## 2023-03-30 NOTE — Progress Notes (Signed)
H&V Care Navigation CSW Progress Note  Clinical Social Worker met with pt and provided with transportation resources.  Pt normally has no issue getting to appts as he has his own car but it broke down and wasn't fixed by the time of the appt- forsees no future issues with transport but will look into Medicaid transport if needed.  Patient is participating in a Managed Medicaid Plan:  Yes  SDOH Screenings   Food Insecurity: Food Insecurity Present (03/13/2022)  Housing: Low Risk  (03/13/2022)  Transportation Needs: Unmet Transportation Needs (03/13/2022)  Financial Resource Strain: High Risk (03/13/2022)  Tobacco Use: Medium Risk (03/30/2023)    Burna Sis, LCSW Clinical Social Worker [facility] Desk#: [phone] Cell#: [phone]

## 2023-03-30 NOTE — Patient Instructions (Signed)
INCREASE Carvedilol to 18.75 mg ( 1 1/2 Tab) Twice daily  Labs done today, your results will be available in MyChart, we will contact you for abnormal readings.  Your physician has requested that you have an echocardiogram. Echocardiography is a painless test that uses sound waves to create images of your heart. It provides your doctor with information about the size and shape of your heart and how well your heart's chambers and valves are working. This procedure takes approximately one hour. There are no restrictions for this procedure. Please do NOT wear cologne, perfume, aftershave, or lotions (deodorant is allowed). Please arrive 15 minutes prior to your appointment time.  Your physician recommends that you schedule a follow-up appointment in: 3 months.  If you have any questions or concerns before your next appointment please send [place] a message through [place] or call our office at [phone].    TO LEAVE A MESSAGE FOR THE NURSE SELECT OPTION 2, PLEASE LEAVE A MESSAGE INCLUDING: YOUR NAME DATE OF BIRTH CALL BACK NUMBER REASON FOR CALL**this is important as we prioritize the call backs  YOU WILL RECEIVE A CALL BACK THE SAME DAY AS LONG AS YOU CALL BEFORE 4:00 PM  At the [facility], you and your health needs are our priority. As part of our continuing mission to provide you with exceptional heart care, we have created designated Provider Care Teams. These Care Teams include your primary Cardiologist (physician) and Advanced Practice Providers (APPs- Physician Assistants and Nurse Practitioners) who all work together to provide you with the care you need, when you need it.   You may see any of the following providers on your designated Care Team at your next follow up: Dr Arvilla Meres Dr Marca Ancona Dr. Dorthula Nettles Dr. Clearnce Hasten Amy Filbert Schilder, NP Robbie Lis, [place] [facility] [place], [place] Brynda Peon, NP [place] Lee, NP Karle Plumber,  PharmD   Please be sure to bring in all your medications bottles to every appointment.    Thank you for choosing [facility]

## 2023-04-01 NOTE — Progress Notes (Signed)
PCP: Pcp, No Cardiology: Dr. Shirlee Latch  Kyle Peck is a 62 y.o. with a history of arthritis, ETOH abuse, tobacco abuse, THC use, and cocaine abuse.  Presented to [facility] on 10/21/20 with progressive shortness of breath and fatigue. CXR with pulmonary edema. BNP 2252. Echo showed severely reduced EF 15% and mildly reduced RV. IV Lasix started.  He developed lethargy with respiratory failure, suspect ETOH withdrawal. Required intubation 10/24/20. Became hypotensive requiring norepinephrine and vasopressin, he developed AKI and had CVVH transiently. He was found to have MRSA bacteremia, possibly from PNA.  TEE showed no endocarditis.  Eventually he had right and left heart cath, showing severe 3 vessel disease.  We recommended CABG but wanted MRI viability study first.  Patient decided to leave AMA.  He did get his medications prior to discharge and consented to wear a Lifevest.    He subsequently saw Dr. Laneta Simmers in the office, was not thought to have good targets for CABG.  Given lack of chest pain, CABG was not recommended.   Echo 2/23 showed EF 25-30%, severe LV dilation with wall motion abnormalities, normal RV, mild Kyle. Referred to EP for ICD but did not have insurance coverage [facility]).  Today he returns for HF follow up. He is not smoking.  Working full time.  Does yardwork, runs chainsaw etc without problems.  Main complaint is knee arthritis.  No chest pain, no exertional dyspnea, no palpitations.  Weight down 5 lbs.  No ETOH.   ECG (personally reviewed): NSR, 1st degree AVB 224 msec, old ASMI  Labs (6/22): K 4, creatinine 0.73 Labs (11/22): K 3.9, creatinine 0.87, digoxin < 0.2 Labs (5/23): K 4.0, creatinine 1.00 Labs (10/23): K 4.7, creatinine 0.81, LDL 67 Labs (5/24): digoxin 0.4, K 4.5, creatinine 1.08  PMH: 1. ETOH abuse.  2. Smoking 3. Prior cocaine use.  4. MRSA bacteremia 5/22 - TEE (5/22): no endocarditis.  5. CAD: LHC (5/22) with totally occluded pLAD with  collaterals, totally occluded ramus with collaterals, 70% proximal RCA, PDA totally occluded with collaterals. CABG recommended.  6. Chronic systolic CHF: Ischemic cardiomyopathy.  Echo (5/22) with EF 15%, moderate LV enlargement, moderate RV enlargement with mildly decreased RV systolic function, mild Kyle.  - RHC (5/22): mean RA 3, PA 26/4, mean PCWP 5, CI 2.76 Fick, CI 3.01 thermo.  - Echo (3/23): EF 25-30%, severe LV dilation with wall motion abnormalities, normal RV, mild Kyle.  7. BPH 8. Knee OA 9. HTN  Social History   Socioeconomic History   Marital status: Married    Spouse name: Kyle Peck   Number of children: Not on file   Years of education: Not on file   Highest education level: Not on file  Occupational History   Not on file  Tobacco Use   Smoking status: Former    Current packs/day: 0.50    Types: Cigars, Cigarettes   Smokeless tobacco: Never  Vaping Use   Vaping status: Never Used  Substance and Sexual Activity   Alcohol use: Yes    Alcohol/week: 15.0 standard drinks of alcohol    Types: 15 Shots of liquor per week    Comment: intermittently   Drug use: Yes    Frequency: 2.0 times per week    Types: Marijuana    Comment: "also around cocaine before"   Sexual activity: Not on file  Other Topics Concern   Not on file  Social History Narrative   Not on file   Social Determinants of Health  Financial Resource Strain: High Risk (03/13/2022)   Overall Financial Resource Strain (CARDIA)    Difficulty of Paying Living Expenses: Very hard  Food Insecurity: Food Insecurity Present (03/13/2022)   Hunger Vital Sign    Worried About Running Out of Food in the Last Year: Sometimes true    Ran Out of Food in the Last Year: Sometimes true  Transportation Needs: Unmet Transportation Needs (03/13/2022)   PRAPARE - Administrator, Civil Service (Medical): Yes    Lack of Transportation (Non-Medical): Yes  Physical Activity: Not on file  Stress: Not on file   Social Connections: Not on file  Intimate Partner Violence: Not on file   Family History  Problem Relation Age of Onset   Congestive Heart Failure Mother    Congestive Heart Failure Father    ROS: All systems reviewed and negative except as per HPI.   Current Outpatient Medications  Medication Sig Dispense Refill   aspirin 81 MG chewable tablet Chew 1 tablet (81 mg total) by mouth daily. 30 tablet 11   atorvastatin (LIPITOR) 40 MG tablet Take 1 tablet (40 mg total) by mouth daily. 90 tablet 3   digoxin (LANOXIN) 0.125 MG tablet Take 0.5 tablets (0.0625 mg total) by mouth daily. 45 tablet 3   empagliflozin (JARDIANCE) 10 MG TABS tablet Take 1 tablet (10 mg total) by mouth daily before breakfast. 90 tablet 3   eplerenone (INSPRA) 50 MG tablet Take 1 tablet (50 mg total) by mouth daily. 90 tablet 3   furosemide (LASIX) 20 MG tablet Take 1 tablet (20 mg total) by mouth daily as needed. 90 tablet 3   omeprazole (PRILOSEC) 20 MG capsule TAKE 1 CAPSULE(20 MG) BY MOUTH DAILY 90 capsule 0   sacubitril-valsartan (ENTRESTO) 97-103 MG Take 1 tablet by mouth 2 (two) times daily. 180 tablet 3   tamsulosin (FLOMAX) 0.4 MG CAPS capsule Take 1 capsule (0.4 mg total) by mouth daily after supper. 30 capsule 0   traZODone (DESYREL) 100 MG tablet Take 1 tablet (100 mg total) by mouth at bedtime as needed for sleep. 30 tablet 3   carvedilol (COREG) 12.5 MG tablet Take 1.5 tablets (18.75 mg total) by mouth 2 (two) times daily. 180 tablet 3   No current facility-administered medications for this encounter.   Wt Readings from Last 3 Encounters:  03/30/23 94.2 kg (207 lb 9.6 oz)  11/02/22 96.3 kg (212 lb 3.2 oz)  03/13/22 99.6 kg (219 lb 9.6 oz)   BP 120/70   Pulse 73   Wt 94.2 kg (207 lb 9.6 oz)   SpO2 98%   BMI 30.66 kg/m  General: NAD Neck: No JVD, no thyromegaly or thyroid nodule.  Lungs: Clear to auscultation bilaterally with normal respiratory effort. CV: Nondisplaced PMI.  Heart regular S1/S2,  no S3/S4, no murmur.  No peripheral edema.  No carotid bruit.  Normal pedal pulses.  Abdomen: Soft, nontender, no hepatosplenomegaly, no distention.  Skin: Intact without lesions or rashes.  Neurologic: Alert and oriented x 3.  Psych: Normal affect. Extremities: No clubbing or cyanosis.  HEENT: Normal.   Assessment/Plan: 1. 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  Echo in 5/22 with EF 15%, moderate LV dilation, mildly decreased RV function, mild Kyle. Ischemic cardiomyopathy based on 6/22 cath. RHC in 5/22 showed normal cardiac index.  Echo 2/23 with EF 25-30%, severe LV dilation with wall motion abnormalities, normal RV, mild Kyle.  He is not volume overloaded on exam, NYHA class I.  -  Continue digoxin 0.0625 mg daily. Check level today. - Continue Entresto 97/103 bid, BMET today. - Increase Coreg to 18.75 mg bid.  - Continue eplerenone 50 mg daily (painful gynecomastia with spironolactone).  - Continue Jardiance 10 mg daily.  - He can take Lasix prn.  - Repeat echo. If EF remains < 35%, will need ICD.  He now has insurance that will cover. Not CRT candidate with narrow QRS.   - Needs to stay off ETOH.  2. ETOH abuse: He has not had anything to drink since leaving the hospital in 2022. This may have contributed to his cardiomyopathy.  - I urged that he remain abstinent.  3. Cocaine abuse: This was rare and he has not used since 2022.  4. MRSA bacteremia: ?PNA source.  TEE 5/22 showed no endocarditis. 5 CAD: Cath 6/22 with occluded ostial PDA, mid ramus, and proximal LAD, all with collaterals. He saw Dr. Laneta Simmers.  Given lack of chest pain and poor targets for CABG, CABG was not recommended. Presentation in 5/22 with CHF.  No recent chest pain.  - Continue ASA 81  - Continue statin, check lipids today.  6. Knee OA: Significantly limiting. Has seen Dr. August Saucer with [facility].   Follow up in 3 months with APP.  Marca Ancona  04/01/2023

## 2023-05-07 ENCOUNTER — Telehealth (HOSPITAL_COMMUNITY): Payer: Self-pay | Admitting: Cardiology

## 2023-05-07 ENCOUNTER — Telehealth (HOSPITAL_COMMUNITY): Payer: Self-pay | Admitting: Licensed Clinical Social Worker

## 2023-05-07 NOTE — Telephone Encounter (Signed)
H&V Care Navigation CSW Progress Note  Clinical Social Worker informed that patient has no transportation to [employer].  CSW called pt to discuss.  At previous appt pt had reported he had a car and was waiting on getting it fixed- states it is now fixed but not reliable so he does not want to drive it such a distance.  States he is now aware of Medicaid transport but is too close to appt date for him to arrange.  Patient will try to find another ride and if not will call in to reschedule appt.   Patient is participating in a Managed Medicaid Plan:  Yes  SDOH Screenings   Food Insecurity: Food Insecurity Present (03/13/2022)  Housing: Low Risk  (03/13/2022)  Transportation Needs: Unmet Transportation Needs (03/13/2022)  Financial Resource Strain: High Risk (03/13/2022)  Tobacco Use: Medium Risk (03/30/2023)    Burna Sis, LCSW Clinical Social Worker [facility] Desk#: [phone] Cell#: [phone]

## 2023-05-07 NOTE — Telephone Encounter (Signed)
Pt need transport asst, appt is tomorrow, pt lvm. Thanks

## 2023-05-08 ENCOUNTER — Ambulatory Visit (HOSPITAL_COMMUNITY)
Admission: RE | Admit: 2023-05-08 | Discharge: 2023-05-08 | Disposition: A | Discharge status: Home / Self Care | Payer: Medicaid Other | Source: Ambulatory Visit | Attending: Cardiology | Admitting: Cardiology

## 2023-05-08 DIAGNOSIS — E785 Hyperlipidemia, unspecified: Secondary | ICD-10-CM | POA: Insufficient documentation

## 2023-05-08 DIAGNOSIS — I340 Nonrheumatic mitral (valve) insufficiency: Secondary | ICD-10-CM | POA: Insufficient documentation

## 2023-05-08 DIAGNOSIS — I5022 Chronic systolic (congestive) heart failure: Secondary | ICD-10-CM | POA: Diagnosis not present

## 2023-05-08 DIAGNOSIS — Z006 Encounter for examination for normal comparison and control in clinical research program: Secondary | ICD-10-CM

## 2023-05-08 DIAGNOSIS — F1910 Other psychoactive substance abuse, uncomplicated: Secondary | ICD-10-CM | POA: Insufficient documentation

## 2023-05-08 LAB — ECHOCARDIOGRAM COMPLETE
Area-P 1/2: 2.24 cm2
Calc EF: 22.7 %
S' Lateral: 4.2 cm
Single Plane A2C EF: 19.4 %
Single Plane A4C EF: 27.9 %

## 2023-05-08 MED ORDER — PERFLUTREN LIPID MICROSPHERE
1.0000 mL | INTRAVENOUS | Status: AC | PRN
  Administered 2023-05-08: 5 mL via INTRAVENOUS

## 2023-05-09 ENCOUNTER — Telehealth (HOSPITAL_COMMUNITY): Payer: Self-pay

## 2023-05-09 DIAGNOSIS — I5022 Chronic systolic (congestive) heart failure: Secondary | ICD-10-CM

## 2023-05-09 NOTE — Telephone Encounter (Signed)
Patient advised and verbalized understanding. Patient agreeable with referral, Referral placed  Orders Placed This Encounter  Procedures   Ambulatory referral to Cardiac Electrophysiology    Referral Priority:   Routine    Referral Type:   Consultation    Referral Reason:   Specialty Services Required    Requested Specialty:   Cardiology    Number of Visits Requested:   1

## 2023-05-09 NOTE — Telephone Encounter (Signed)
-----   Message from Marca Ancona sent at 05/08/2023 11:16 PM EST ----- EF 30-35%.  Recommend referral to EP for ICD.

## 2023-05-10 NOTE — Research (Signed)
SITE: [id number]     Subject #[id number]   Subprotocol: A  Inclusion Criteria  Patients who meet all of the following criteria are eligible for enrollment as study participants:  Yes No  Age > 62 years old X   Eligible to wear Holter Study X    Exclusion Criteria  Patients who meet any of these criteria are not eligible for enrollment as study participants: Yes No  1. Receiving any mechanical (respiratory or circulatory) or renal support therapy at Screening or during Visit #1.  X  2.  Any other conditions that in the opinion of the investigators are likely to prevent compliance with the study protocol or pose a safety concern if the subject participates in the study.  X  3. Poor tolerance, namely susceptible to severe skin allergies from ECG adhesive patch application.  X   Protocol: REV H                                     Residential Zip [address] (First 3 digits ONLY)                                             [facility] Informed Consent   Subject Name: Kyle Peck  Subject met inclusion and exclusion criteria.  The informed consent form, study requirements and expectations were reviewed with the subject. Subject had opportunity to read consent and questions and concerns were addressed prior to the signing of the consent form.  The subject verbalized understanding of the trial requirements.  The subject agreed to participate in the [facility] EF ACT trial and signed the informed consent at 15:56 on 08-May-2023.  The informed consent was obtained prior to performance of any protocol-specific procedures for the subject.  A copy of the signed informed consent was given to the subject and a copy was placed in the subject's medical record.   Dyanne Iha          Current Outpatient Medications:    aspirin 81 MG chewable tablet, Chew 1 tablet (81 mg total) by mouth daily., Disp: 30 tablet, Rfl: 11   atorvastatin (LIPITOR) 40 MG tablet, Take 1 tablet (40 mg total) by mouth daily., Disp: 90  tablet, Rfl: 3   carvedilol (COREG) 12.5 MG tablet, Take 1.5 tablets (18.75 mg total) by mouth 2 (two) times daily., Disp: 180 tablet, Rfl: 3   digoxin (LANOXIN) 0.125 MG tablet, Take 0.5 tablets (0.0625 mg total) by mouth daily., Disp: 45 tablet, Rfl: 3   empagliflozin (JARDIANCE) 10 MG TABS tablet, Take 1 tablet (10 mg total) by mouth daily before breakfast., Disp: 90 tablet, Rfl: 3   eplerenone (INSPRA) 50 MG tablet, Take 1 tablet (50 mg total) by mouth daily., Disp: 90 tablet, Rfl: 3   furosemide (LASIX) 20 MG tablet, Take 1 tablet (20 mg total) by mouth daily as needed., Disp: 90 tablet, Rfl: 3   omeprazole (PRILOSEC) 20 MG capsule, TAKE 1 CAPSULE(20 MG) BY MOUTH DAILY, Disp: 90 capsule, Rfl: 0   sacubitril-valsartan (ENTRESTO) 97-103 MG, Take 1 tablet by mouth 2 (two) times daily., Disp: 180 tablet, Rfl: 3   tamsulosin (FLOMAX) 0.4 MG CAPS capsule, Take 1 capsule (0.4 mg total) by mouth daily after supper., Disp: 30 capsule, Rfl: 0   traZODone (DESYREL) 100 MG  tablet, Take 1 tablet (100 mg total) by mouth at bedtime as needed for sleep., Disp: 30 tablet, Rfl: 3

## 2023-06-12 ENCOUNTER — Telehealth (HOSPITAL_COMMUNITY): Payer: Self-pay

## 2023-06-12 NOTE — Telephone Encounter (Signed)
 Called and left patient a voice message to confirm/remind patient of their appointment at the [facility] on 06/12/22.   And to bring in all medications and/or complete list.

## 2023-06-13 ENCOUNTER — Encounter (HOSPITAL_COMMUNITY): Payer: Medicaid Other

## 2023-07-09 ENCOUNTER — Ambulatory Visit: Payer: Medicaid Other | Admitting: Cardiology

## 2023-07-09 ENCOUNTER — Telehealth (HOSPITAL_COMMUNITY): Payer: Self-pay

## 2023-07-09 DIAGNOSIS — I5022 Chronic systolic (congestive) heart failure: Secondary | ICD-10-CM

## 2023-07-09 MED ORDER — OMEPRAZOLE 20 MG PO CPDR: 20.0000 mg | DELAYED_RELEASE_CAPSULE | Freq: Every day | ORAL | 0 refills | Status: DC

## 2023-07-09 NOTE — Telephone Encounter (Signed)
Patient's Omeprazole medication has been sent to pt's pharmacy.

## 2023-07-12 ENCOUNTER — Other Ambulatory Visit (HOSPITAL_COMMUNITY): Payer: Self-pay

## 2023-07-12 DIAGNOSIS — I5022 Chronic systolic (congestive) heart failure: Secondary | ICD-10-CM

## 2023-07-12 MED ORDER — OMEPRAZOLE 20 MG PO CPDR: 20.0000 mg | DELAYED_RELEASE_CAPSULE | Freq: Every day | ORAL | 0 refills | Status: DC

## 2023-08-08 ENCOUNTER — Ambulatory Visit: Payer: Medicaid Other | Attending: Cardiology | Admitting: Cardiology

## 2023-08-10 ENCOUNTER — Encounter: Payer: Self-pay | Admitting: Cardiology

## 2023-08-17 NOTE — Addendum Note (Signed)
 Encounter addended by: Howell Rucks, RDCS on: 08/17/2023 12:52 PM  Actions taken: Imaging Exam ended

## 2023-09-27 ENCOUNTER — Other Ambulatory Visit (HOSPITAL_COMMUNITY): Payer: Self-pay

## 2023-09-27 ENCOUNTER — Telehealth (HOSPITAL_COMMUNITY): Payer: Self-pay

## 2023-09-27 NOTE — Telephone Encounter (Signed)
 [facility] Patient Advocate Encounter  Prior authorization for Jardiance  has been submitted and approved. Test billing returns $4 for 90 day supply.  Key: [id number] Effective: 09/13/2023 to 09/26/2024  Kennis Peacock, CPhT Rx Patient Advocate Phone: [phone]

## 2023-10-08 ENCOUNTER — Telehealth (HOSPITAL_COMMUNITY): Payer: Self-pay

## 2023-10-08 NOTE — Telephone Encounter (Signed)
 Called to confirm/remind patient of their appointment at the [facility] on 10/09/23.   Appointment:   [] Confirmed  [x] Left mess   [] No answer/No voice mail  [] VM Full/unable to leave message  [] Phone not in service  And to bring in all medications and/or complete list.

## 2023-10-09 ENCOUNTER — Encounter (HOSPITAL_COMMUNITY): Payer: Self-pay

## 2023-10-09 ENCOUNTER — Ambulatory Visit (HOSPITAL_COMMUNITY)
Admission: RE | Admit: 2023-10-09 | Discharge: 2023-10-09 | Disposition: A | Discharge status: Home / Self Care | Source: Ambulatory Visit | Attending: Cardiology | Admitting: Cardiology

## 2023-10-09 VITALS — BP 90/54 | HR 68 | Wt 194.8 lb

## 2023-10-09 DIAGNOSIS — M179 Osteoarthritis of knee, unspecified: Secondary | ICD-10-CM | POA: Diagnosis not present

## 2023-10-09 DIAGNOSIS — F1410 Cocaine abuse, uncomplicated: Secondary | ICD-10-CM | POA: Diagnosis not present

## 2023-10-09 DIAGNOSIS — I110 Hypertensive heart disease with heart failure: Secondary | ICD-10-CM | POA: Insufficient documentation

## 2023-10-09 DIAGNOSIS — I2510 Atherosclerotic heart disease of native coronary artery without angina pectoris: Secondary | ICD-10-CM

## 2023-10-09 DIAGNOSIS — F1011 Alcohol abuse, in remission: Secondary | ICD-10-CM | POA: Diagnosis not present

## 2023-10-09 DIAGNOSIS — I255 Ischemic cardiomyopathy: Secondary | ICD-10-CM | POA: Diagnosis not present

## 2023-10-09 DIAGNOSIS — I5022 Chronic systolic (congestive) heart failure: Secondary | ICD-10-CM | POA: Diagnosis present

## 2023-10-09 DIAGNOSIS — Z79899 Other long term (current) drug therapy: Secondary | ICD-10-CM | POA: Diagnosis not present

## 2023-10-09 DIAGNOSIS — I340 Nonrheumatic mitral (valve) insufficiency: Secondary | ICD-10-CM | POA: Insufficient documentation

## 2023-10-09 DIAGNOSIS — Z7984 Long term (current) use of oral hypoglycemic drugs: Secondary | ICD-10-CM | POA: Diagnosis not present

## 2023-10-09 LAB — BASIC METABOLIC PANEL WITH GFR
Anion gap: 8 (ref 5–15)
BUN: 17 mg/dL (ref 8–23)
CO2: 26 mmol/L (ref 22–32)
Calcium: 9.3 mg/dL (ref 8.9–10.3)
Chloride: 104 mmol/L (ref 98–111)
Creatinine, Ser: 1.23 mg/dL (ref 0.61–1.24)
GFR, Estimated: >60 mL/min (ref >60)
Glucose, Bld: 136 mg/dL — ABNORMAL HIGH (ref 70–99)
Potassium: 4.9 mmol/L (ref 3.5–5.1)
Sodium: 138 mmol/L (ref 135–145)

## 2023-10-09 LAB — LIPID PANEL
Cholesterol: 112 mg/dL (ref 0–200)
HDL: 33 mg/dL — ABNORMAL LOW (ref >40)
LDL Cholesterol: 55 mg/dL (ref 0–99)
Total CHOL/HDL Ratio: 3.4 ratio
Triglycerides: 119 mg/dL (ref <150)
VLDL: 24 mg/dL (ref 0–40)

## 2023-10-09 NOTE — Progress Notes (Signed)
 [facility] NOTE  PCP: Pcp, No HF Cardiology: Dr. Mitzie Anda  Reason for Visit: Heart Failure Follow-up HPI: Kyle Peck is a 63 y.o. with a history of arthritis, ETOH abuse, tobacco abuse, THC use, and cocaine abuse.  Presented to [facility] on 5/22 with progressive shortness of breath and fatigue. Echo showed severely reduced EF 15% and mildly reduced RV. IV Lasix  started.  He developed respiratory failure, suspect ETOH withdrawal requiring intubation. Became hypotensive requiring pressors, he developed AKI and had CVVH transiently. He was found to have MRSA bacteremia, possibly from PNA. TEE showed no endocarditis. R/LHC showed severe 3 vessel disease.  Recommended CABG but wanted MRI viability study first.  Patient decided to leave AMA.  He did get his medications prior to discharge and consented to wear a Lifevest.    He subsequently saw Dr. Sherene Dilling in the office, was not thought to have good targets for CABG.  Given lack of chest pain, CABG was not recommended.   Echo 2/23 showed EF 25-30%, severe LV dilation with wall motion abnormalities, normal RV, mild Kyle. Referred to EP for ICD but did not have insurance coverage [facility]).  Last seen in clinic 10/24. Doing ok. No ETOH.  He returns today for heart failure follow up. Overall feeling okay. NYHA I-II. No complaints. Denies chest pain, dyspnea, fatigue, near-syncope, orthopnea, palpitations, dizziness, and abnormal bleeding. Able to perform ADLs. Appetite okay. Sleeping on 2 pillows. Weight at home has dropped down. BP at home 130/70. No smoking, no ETOH. Compliant with all medications.   PMH: 1. ETOH abuse.  2. Smoking 3. Prior cocaine use.  4. MRSA bacteremia 5/22 - TEE (5/22): no endocarditis.  5. CAD: LHC (5/22) with totally occluded pLAD with collaterals, totally occluded ramus with collaterals, 70% proximal RCA, PDA totally occluded with collaterals. CABG recommended.  6. Chronic systolic CHF: Ischemic  cardiomyopathy.  Echo (5/22) with EF 15%, moderate LV enlargement, moderate RV enlargement with mildly decreased RV systolic function, mild Kyle.  - RHC (5/22): mean RA 3, PA 26/4, mean PCWP 5, CI 2.76 Fick, CI 3.01 thermo.  - Echo (3/23): EF 25-30%, severe LV dilation with wall motion abnormalities, normal RV, mild Kyle.  7. BPH 8. Knee OA 9. HTN  Social History   Socioeconomic History   Marital status: Married    Spouse name: Orelia Binet   Number of children: Not on file   Years of education: Not on file   Highest education level: Not on file  Occupational History   Not on file  Tobacco Use   Smoking status: Former    Current packs/day: 0.50    Types: Cigars, Cigarettes   Smokeless tobacco: Never  Vaping Use   Vaping status: Never Used  Substance and Sexual Activity   Alcohol use: Yes    Alcohol/week: 15.0 standard drinks of alcohol    Types: 15 Shots of liquor per week    Comment: intermittently   Drug use: Yes    Frequency: 2.0 times per week    Types: Marijuana    Comment: "also around cocaine before"   Sexual activity: Not on file  Other Topics Concern   Not on file  Social History Narrative   Not on file   Social Drivers of Health   Financial Resource Strain: High Risk (03/13/2022)   Overall Financial Resource Strain (CARDIA)    Difficulty of Paying Living Expenses: Very hard  Food Insecurity: Food Insecurity Present (03/13/2022)   Hunger Vital Sign  Worried About Programme researcher, broadcasting/film/video in the Last Year: Sometimes true    [employer] of Food in the Last Year: Sometimes true  Transportation Needs: Unmet Transportation Needs (03/13/2022)   PRAPARE - Administrator, Civil Service (Medical): Yes    Lack of Transportation (Non-Medical): Yes  Physical Activity: Not on file  Stress: Not on file  Social Connections: Not on file  Intimate Partner Violence: Not on file   Family History  Problem Relation Age of Onset   Congestive Heart Failure Mother    Congestive  Heart Failure Father    ROS: All systems reviewed and negative except as per HPI.   Current Outpatient Medications  Medication Sig Dispense Refill   aspirin  81 MG chewable tablet Chew 1 tablet (81 mg total) by mouth daily. 30 tablet 11   atorvastatin  (LIPITOR) 40 MG tablet Take 1 tablet (40 mg total) by mouth daily. 90 tablet 3   carvedilol  (COREG ) 12.5 MG tablet Take 1.5 tablets (18.75 mg total) by mouth 2 (two) times daily. 180 tablet 3   digoxin  (LANOXIN ) 0.125 MG tablet Take 0.5 tablets (0.0625 mg total) by mouth daily. 45 tablet 3   empagliflozin  (JARDIANCE ) 10 MG TABS tablet Take 1 tablet (10 mg total) by mouth daily before breakfast. 90 tablet 3   eplerenone  (INSPRA ) 50 MG tablet Take 1 tablet (50 mg total) by mouth daily. 90 tablet 3   furosemide  (LASIX ) 20 MG tablet Take 1 tablet (20 mg total) by mouth daily as needed. 90 tablet 3   omeprazole  (PRILOSEC) 20 MG capsule Take 1 capsule (20 mg total) by mouth daily. 90 capsule 0   sacubitril -valsartan  (ENTRESTO ) 97-103 MG Take 1 tablet by mouth 2 (two) times daily. 180 tablet 3   traZODone  (DESYREL ) 100 MG tablet Take 1 tablet (100 mg total) by mouth at bedtime as needed for sleep. 30 tablet 3   No current facility-administered medications for this encounter.   Wt Readings from Last 3 Encounters:  10/09/23 88.4 kg (194 lb 12.8 oz)  03/30/23 94.2 kg (207 lb 9.6 oz)  11/02/22 96.3 kg (212 lb 3.2 oz)   BP (!) 90/54   Pulse 68   Wt 88.4 kg (194 lb 12.8 oz)   SpO2 97%   BMI 28.77 kg/m   Physical Exam: General: Well appearing. No distress on RA. Walked into clinic.  Cardiac: JVP flat. S1 and S2 present. No murmurs or rub. Extremities: Warm and dry.  No peripheral edema.  Neuro: Alert and oriented x3. Affect pleasant. Moves all extremities without difficulty.  ECG (personally reviewed): NSR, 1st degree AVB 224 msec, old ASMI  Assessment/Plan: 1. 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   Echo in 5/22 with EF 15%, moderate LV dilation, mildly decreased RV function, mild Kyle. Ischemic cardiomyopathy based on 6/22 cath. RHC in 5/22 showed normal cardiac index.  Echo 2/23 with EF 25-30%, severe LV dilation with WMA, nl RV, mild Kyle. Repeat 12/24 with EF 30-35%. - NYHA I. Volume ok.  - Continue digoxin  0.0625 mg daily.  - Continue Entresto  97/103 bid. BMET today. - Continue Coreg  to 18.75 mg bid.  - Continue eplerenone  50 mg daily (painful gynecomastia with spironolactone ).  - Continue Jardiance  10 mg daily.  - Has been taking Lasix  20 mg EOD.  - Repeat echo ordered. If EF remains < 35%, will need ICD. He now has insurance that will cover. Not CRT candidate with narrow QRS.    2. ETOH abuse:  He has not had anything to drink since leaving the hospital in 2022. This may have contributed to his cardiomyopathy. Reports no ETOH use.   3. Cocaine abuse: This was rare and he has not used since 2022. Behavior extremely erratic during this OPV. Denies use.   4. MRSA bacteremia: ?PNA source. TEE 5/22 showed no endocarditis.  5 CAD: Cath 6/22 with occluded ostial PDA, mid ramus, and proximal LAD, all with collaterals. He saw Dr. Sherene Dilling.  Given lack of chest pain and poor targets for CABG, CABG was not recommended. Presentation in 5/22 with CHF.  No recent chest pain.  - Continue ASA 81  - Continue statin, check lipids today.   6. Knee OA: Significantly limiting. Has seen Dr. Rozelle Corning with [facility].   Follow up in 3 month with Dr. Mitzie Anda + echo  [place] Daphanie Oquendo, NP 10/09/2023

## 2023-10-09 NOTE — Patient Instructions (Addendum)
 Thank you for coming in today  If you had labs drawn today, any labs that are abnormal the clinic will call you No news is good news  Medications: No changes  Follow up appointments:  Your physician recommends that you schedule a follow-up appointment in:  2-3 months With Dr. Mclean with echocardiogram  Please call our office to schedule the follow-up appointment in June for July/August 2025 .    Do the following things EVERYDAY: Weigh yourself in the morning before breakfast. Write it down and keep it in a log. Take your medicines as prescribed Eat low salt foods--Limit salt (sodium) to 2000 mg per day.  Stay as active as you can everyday Limit all fluids for the day to less than 2 liters   At the [facility], you and your health needs are our priority. As part of our continuing mission to provide you with exceptional heart care, we have created designated Provider Care Teams. These Care Teams include your primary Cardiologist (physician) and Advanced Practice Providers (APPs- Physician Assistants and Nurse Practitioners) who all work together to provide you with the care you need, when you need it.   You may see any of the following providers on your designated Care Team at your next follow up: Dr Jules Oar Dr Peder Bourdon Dr. Mimi Alt, NP Ruddy Corral, [place] [facility] [place], [place] Dennise Fitz, NP Luster Salters, PharmD   Please be sure to bring in all your medications bottles to every appointment.    Thank you for choosing [facility]  If you have any questions or concerns before your next appointment please send us  a message through [place] or call our office at [phone].    TO LEAVE A MESSAGE FOR THE NURSE SELECT OPTION 2, PLEASE LEAVE A MESSAGE INCLUDING: YOUR NAME DATE OF BIRTH CALL BACK NUMBER REASON FOR CALL**this is important as we prioritize the call  backs  YOU WILL RECEIVE A CALL BACK THE SAME DAY AS LONG AS YOU CALL BEFORE 4:00 PM

## 2023-12-24 ENCOUNTER — Other Ambulatory Visit (HOSPITAL_COMMUNITY): Payer: Self-pay

## 2023-12-24 MED ORDER — FUROSEMIDE 20 MG PO TABS
20.0000 mg | ORAL_TABLET | Freq: Every day | ORAL | 3 refills | Status: AC | PRN
  Filled 2024-01-14 – 2024-04-17 (×2): qty 90, 90d supply, fill #0

## 2023-12-31 ENCOUNTER — Ambulatory Visit: Admitting: Orthopedic Surgery

## 2023-12-31 NOTE — Progress Notes (Unsigned)
 SABRA

## 2024-01-10 ENCOUNTER — Other Ambulatory Visit (HOSPITAL_COMMUNITY): Payer: Self-pay

## 2024-01-10 DIAGNOSIS — I5022 Chronic systolic (congestive) heart failure: Secondary | ICD-10-CM

## 2024-01-10 MED ORDER — SACUBITRIL-VALSARTAN 97-103 MG PO TABS
1.0000 | ORAL_TABLET | Freq: Two times a day (BID) | ORAL | 3 refills | Status: AC
  Filled 2024-01-14: qty 180, 90d supply, fill #0
  Filled 2024-04-17: qty 180, 90d supply, fill #1

## 2024-01-10 MED ORDER — SACUBITRIL-VALSARTAN 97-103 MG PO TABS
1.0000 | ORAL_TABLET | Freq: Two times a day (BID) | ORAL | 3 refills | Status: DC
  Filled 2024-01-10: qty 180, 90d supply, fill #0

## 2024-01-10 MED ORDER — EPLERENONE 50 MG PO TABS
50.0000 mg | ORAL_TABLET | Freq: Every day | ORAL | 3 refills | Status: DC
  Filled 2024-01-10: qty 90, 90d supply, fill #0

## 2024-01-10 MED ORDER — ATORVASTATIN CALCIUM 40 MG PO TABS
40.0000 mg | ORAL_TABLET | Freq: Every day | ORAL | 3 refills | Status: AC
  Filled 2024-04-17: qty 90, 90d supply, fill #0

## 2024-01-10 MED ORDER — EMPAGLIFLOZIN 10 MG PO TABS
10.0000 mg | ORAL_TABLET | Freq: Every day | ORAL | 3 refills | Status: DC
  Filled 2024-01-10: qty 90, 90d supply, fill #0

## 2024-01-10 MED ORDER — EPLERENONE 50 MG PO TABS
50.0000 mg | ORAL_TABLET | Freq: Every day | ORAL | 3 refills | Status: AC
  Filled 2024-01-14 – 2024-02-06 (×2): qty 90, 90d supply, fill #0
  Filled 2024-05-13: qty 90, 90d supply, fill #1

## 2024-01-10 MED ORDER — EMPAGLIFLOZIN 10 MG PO TABS
10.0000 mg | ORAL_TABLET | Freq: Every day | ORAL | 3 refills | Status: AC
  Filled 2024-01-14: qty 90, 90d supply, fill #0
  Filled 2024-04-17: qty 90, 90d supply, fill #1

## 2024-01-10 NOTE — Addendum Note (Signed)
 Addended by: ELINDA ROLIN SAILOR on: 01/10/2024 01:39 PM   Modules accepted: Orders

## 2024-01-10 NOTE — Addendum Note (Signed)
 Addended by: ELINDA ROLIN SAILOR on: 01/10/2024 04:11 PM   Modules accepted: Orders

## 2024-01-11 ENCOUNTER — Other Ambulatory Visit (HOSPITAL_COMMUNITY): Payer: Self-pay

## 2024-01-11 ENCOUNTER — Other Ambulatory Visit (HOSPITAL_BASED_OUTPATIENT_CLINIC_OR_DEPARTMENT_OTHER): Payer: Self-pay

## 2024-01-14 ENCOUNTER — Other Ambulatory Visit (HOSPITAL_COMMUNITY): Payer: Self-pay

## 2024-01-14 ENCOUNTER — Other Ambulatory Visit (HOSPITAL_BASED_OUTPATIENT_CLINIC_OR_DEPARTMENT_OTHER): Payer: Self-pay

## 2024-01-15 ENCOUNTER — Other Ambulatory Visit (HOSPITAL_BASED_OUTPATIENT_CLINIC_OR_DEPARTMENT_OTHER): Payer: Self-pay

## 2024-01-17 ENCOUNTER — Other Ambulatory Visit (HOSPITAL_BASED_OUTPATIENT_CLINIC_OR_DEPARTMENT_OTHER): Payer: Self-pay

## 2024-01-17 ENCOUNTER — Ambulatory Visit (HOSPITAL_COMMUNITY)

## 2024-01-17 ENCOUNTER — Encounter (HOSPITAL_COMMUNITY): Admitting: Cardiology

## 2024-01-17 MED ORDER — PENICILLIN V POTASSIUM 500 MG PO TABS
500.0000 mg | ORAL_TABLET | Freq: Two times a day (BID) | ORAL | 0 refills | Status: DC
  Filled 2024-01-17: qty 20, 10d supply, fill #0

## 2024-01-21 ENCOUNTER — Ambulatory Visit: Admitting: Orthopedic Surgery

## 2024-01-25 ENCOUNTER — Other Ambulatory Visit (HOSPITAL_BASED_OUTPATIENT_CLINIC_OR_DEPARTMENT_OTHER): Payer: Self-pay

## 2024-02-06 ENCOUNTER — Other Ambulatory Visit (HOSPITAL_COMMUNITY): Payer: Self-pay

## 2024-02-06 ENCOUNTER — Other Ambulatory Visit (HOSPITAL_BASED_OUTPATIENT_CLINIC_OR_DEPARTMENT_OTHER): Payer: Self-pay

## 2024-02-06 MED ORDER — DIGOXIN 125 MCG PO TABS
0.0625 mg | ORAL_TABLET | Freq: Every day | ORAL | 3 refills | Status: DC
  Filled 2024-02-06: qty 45, 90d supply, fill #0

## 2024-02-06 MED ORDER — DIGOXIN 125 MCG PO TABS
0.0625 mg | ORAL_TABLET | Freq: Every day | ORAL | 3 refills | Status: AC
  Filled 2024-02-06 (×2): qty 45, 90d supply, fill #0
  Filled 2024-05-13: qty 45, 90d supply, fill #1

## 2024-02-07 ENCOUNTER — Other Ambulatory Visit (HOSPITAL_BASED_OUTPATIENT_CLINIC_OR_DEPARTMENT_OTHER): Payer: Self-pay

## 2024-02-26 ENCOUNTER — Other Ambulatory Visit (HOSPITAL_BASED_OUTPATIENT_CLINIC_OR_DEPARTMENT_OTHER): Payer: Self-pay

## 2024-02-26 MED ORDER — CEFDINIR 300 MG PO CAPS
300.0000 mg | ORAL_CAPSULE | Freq: Two times a day (BID) | ORAL | 0 refills | Status: DC
  Filled 2024-02-26: qty 20, 10d supply, fill #0

## 2024-03-05 ENCOUNTER — Telehealth (HOSPITAL_COMMUNITY): Payer: Self-pay

## 2024-03-05 NOTE — Telephone Encounter (Signed)
 Called to confirm/remind patient of their appointment at the [facility] on 03/06/24.   Appointment:   [x] Confirmed  [] Left mess   [] No answer/No voice mail  [] VM Full/unable to leave message  [] Phone not in service  Patient reminded to bring all medications and/or complete list.  Confirmed patient has transportation. Gave directions, instructed to utilize valet parking.

## 2024-03-06 ENCOUNTER — Encounter (HOSPITAL_COMMUNITY): Payer: Self-pay

## 2024-03-06 ENCOUNTER — Ambulatory Visit (HOSPITAL_COMMUNITY): Payer: Self-pay | Admitting: Cardiology

## 2024-03-06 ENCOUNTER — Ambulatory Visit (HOSPITAL_COMMUNITY)
Admission: RE | Admit: 2024-03-06 | Discharge: 2024-03-06 | Disposition: A | Discharge status: Home / Self Care | Source: Ambulatory Visit | Attending: Cardiology | Admitting: Cardiology

## 2024-03-06 ENCOUNTER — Ambulatory Visit (HOSPITAL_COMMUNITY)
Admission: RE | Admit: 2024-03-06 | Discharge: 2024-03-06 | Disposition: A | Source: Ambulatory Visit | Attending: Cardiology | Admitting: Cardiology

## 2024-03-06 ENCOUNTER — Other Ambulatory Visit (HOSPITAL_BASED_OUTPATIENT_CLINIC_OR_DEPARTMENT_OTHER): Payer: Self-pay

## 2024-03-06 VITALS — BP 100/66 | HR 88 | Wt 176.6 lb

## 2024-03-06 DIAGNOSIS — Z79899 Other long term (current) drug therapy: Secondary | ICD-10-CM | POA: Diagnosis not present

## 2024-03-06 DIAGNOSIS — I429 Cardiomyopathy, unspecified: Secondary | ICD-10-CM | POA: Insufficient documentation

## 2024-03-06 DIAGNOSIS — I5022 Chronic systolic (congestive) heart failure: Secondary | ICD-10-CM | POA: Diagnosis present

## 2024-03-06 DIAGNOSIS — Z8614 Personal history of Methicillin resistant Staphylococcus aureus infection: Secondary | ICD-10-CM | POA: Insufficient documentation

## 2024-03-06 DIAGNOSIS — Z7982 Long term (current) use of aspirin: Secondary | ICD-10-CM | POA: Insufficient documentation

## 2024-03-06 DIAGNOSIS — F1011 Alcohol abuse, in remission: Secondary | ICD-10-CM | POA: Diagnosis not present

## 2024-03-06 DIAGNOSIS — I110 Hypertensive heart disease with heart failure: Secondary | ICD-10-CM | POA: Diagnosis not present

## 2024-03-06 DIAGNOSIS — I2510 Atherosclerotic heart disease of native coronary artery without angina pectoris: Secondary | ICD-10-CM | POA: Insufficient documentation

## 2024-03-06 DIAGNOSIS — Z7984 Long term (current) use of oral hypoglycemic drugs: Secondary | ICD-10-CM | POA: Insufficient documentation

## 2024-03-06 DIAGNOSIS — I358 Other nonrheumatic aortic valve disorders: Secondary | ICD-10-CM | POA: Insufficient documentation

## 2024-03-06 LAB — COMPREHENSIVE METABOLIC PANEL WITH GFR
ALT: 14 U/L (ref 0–44)
AST: 18 U/L (ref 15–41)
Albumin: 3.8 g/dL (ref 3.5–5.0)
Alkaline Phosphatase: 70 U/L (ref 38–126)
Anion gap: 10 (ref 5–15)
BUN: 14 mg/dL (ref 8–23)
CO2: 26 mmol/L (ref 22–32)
Calcium: 8.9 mg/dL (ref 8.9–10.3)
Chloride: 101 mmol/L (ref 98–111)
Creatinine, Ser: 1.17 mg/dL (ref 0.61–1.24)
GFR, Estimated: >60 mL/min (ref >60)
Glucose, Bld: 97 mg/dL (ref 70–99)
Potassium: 3.7 mmol/L (ref 3.5–5.1)
Sodium: 137 mmol/L (ref 135–145)
Total Bilirubin: 1 mg/dL (ref 0.0–1.2)
Total Protein: 7.4 g/dL (ref 6.5–8.1)

## 2024-03-06 LAB — ECHOCARDIOGRAM COMPLETE
Area-P 1/2: 3.72 cm2
S' Lateral: 3.8 cm

## 2024-03-06 LAB — DIGOXIN LEVEL: Digoxin Level: <0.6 ng/mL — ABNORMAL LOW (ref 0.8–2.0)

## 2024-03-06 MED ORDER — METOPROLOL SUCCINATE ER 25 MG PO TB24
12.5000 mg | ORAL_TABLET | Freq: Every day | ORAL | 3 refills | Status: DC
  Filled 2024-03-06: qty 45, 90d supply, fill #0

## 2024-03-06 NOTE — Progress Notes (Signed)
 ReDS Vest / Clip - 03/06/24 1500       ReDS Vest / Clip   Station Marker C    Ruler Value 29.5    ReDS Value Range Low volume    ReDS Actual Value 31

## 2024-03-06 NOTE — Progress Notes (Signed)
 Echocardiogram 2D Echocardiogram has been performed.  Thea Norlander 03/06/2024, 2:58 PM

## 2024-03-06 NOTE — Progress Notes (Signed)
 ADVANCED HF CLINIC NOTE  PCP: Pcp, No HF Cardiology: Dr. Rolan  Reason for Visit: Heart Failure Follow-up HPI: Kyle Peck is a 63 y.o. with a history of arthritis, ETOH abuse, tobacco abuse, THC use, and cocaine abuse.  Presented to [facility] on 5/22 with progressive shortness of breath and fatigue. Echo showed severely reduced EF 15% and mildly reduced RV. IV Lasix  started.  He developed respiratory failure, suspect ETOH withdrawal requiring intubation. Became hypotensive requiring pressors, he developed AKI and had CVVH transiently. He was found to have MRSA bacteremia, possibly from PNA. TEE showed no endocarditis. R/LHC showed severe 3 vessel disease.  Recommended CABG but wanted MRI viability study first.  Patient decided to leave AMA.  He did get his medications prior to discharge and consented to wear a Lifevest.    He subsequently saw Dr. Lucas in the office, was not thought to have good targets for CABG.  Given lack of chest pain, CABG was not recommended.   Echo 2/23 showed EF 25-30%, severe LV dilation with wall motion abnormalities, normal RV, mild Kyle. Referred to EP for ICD but did not have insurance coverage at the time [name] Planning). He now has insurance that will cover device.   He presents to clinic today for f/u for systolic heart failure and repeat echo.   Echo completed today, interpretation pending.   Says he feels fairly well. Denies resting dyspnea. NYHA Class II, limited more so by b/l knee OA. Denies CP.   Has been off his coreg  x 3 days. He discontinued due to low BP.  His daughter is a Engineer, civil (consulting) and has been monitoring BP at home. Compliant w/ all other meds. BP today is 100/66. ReDs 31%. EKG shows NSR 82 bpm.   He denies tobacco/ drug use. He occasionally will have a beer every once in a while.    ReDs 31%, normal    PMH: 1. ETOH abuse.  2. Smoking 3. Prior cocaine use.  4. MRSA bacteremia 5/22 - TEE (5/22): no endocarditis.  5. CAD: LHC  (5/22) with totally occluded pLAD with collaterals, totally occluded ramus with collaterals, 70% proximal RCA, PDA totally occluded with collaterals. CABG recommended.  6. Chronic systolic CHF: Ischemic cardiomyopathy.  Echo (5/22) with EF 15%, moderate LV enlargement, moderate RV enlargement with mildly decreased RV systolic function, mild Kyle.  - RHC (5/22): mean RA 3, PA 26/4, mean PCWP 5, CI 2.76 Fick, CI 3.01 thermo.  - Echo (3/23): EF 25-30%, severe LV dilation with wall motion abnormalities, normal RV, mild Kyle.  7. BPH 8. Knee OA 9. HTN  Social History   Socioeconomic History   Marital status: Married    Spouse name: Kyle Peck   Number of children: Not on file   Years of education: Not on file   Highest education level: Not on file  Occupational History   Not on file  Tobacco Use   Smoking status: Former    Current packs/day: 0.50    Types: Cigars, Cigarettes   Smokeless tobacco: Never  Vaping Use   Vaping status: Never Used  Substance and Sexual Activity   Alcohol use: Yes    Alcohol/week: 15.0 standard drinks of alcohol    Types: 15 Shots of liquor per week    Comment: intermittently   Drug use: Yes    Frequency: 2.0 times per week    Types: Marijuana    Comment: also around cocaine before   Sexual activity: Not on file  Other  Topics Concern   Not on file  Social History Narrative   Not on file   Social Drivers of Health   Financial Resource Strain: High Risk (03/13/2022)   Overall Financial Resource Strain (CARDIA)    Difficulty of Paying Living Expenses: Very hard  Food Insecurity: Food Insecurity Present (03/13/2022)   Hunger Vital Sign    Worried About Running Out of Food in the Last Year: Sometimes true    Ran Out of Food in the Last Year: Sometimes true  Transportation Needs: Unmet Transportation Needs (03/13/2022)   PRAPARE - Administrator, Civil Service (Medical): Yes    Lack of Transportation (Non-Medical): Yes  Physical Activity: Not on  file  Stress: Not on file  Social Connections: Not on file  Intimate Partner Violence: Not on file   Family History  Problem Relation Age of Onset   Congestive Heart Failure Mother    Congestive Heart Failure Father    ROS: All systems reviewed and negative except as per HPI.   Current Outpatient Medications  Medication Sig Dispense Refill   aspirin  81 MG chewable tablet Chew 1 tablet (81 mg total) by mouth daily. 30 tablet 11   atorvastatin  (LIPITOR) 40 MG tablet Take 1 tablet (40 mg total) by mouth daily. 90 tablet 3   carvedilol  (COREG ) 12.5 MG tablet Take 1.5 tablets (18.75 mg total) by mouth 2 (two) times daily. 180 tablet 3   cefdinir  (OMNICEF ) 300 MG capsule Take 1 capsule (300 mg total) by mouth 2 (two) times daily for 10 days. 20 capsule 0   digoxin  (LANOXIN ) 0.125 MG tablet Take 0.5 tablets (0.0625 mg total) by mouth daily. 45 tablet 3   empagliflozin  (JARDIANCE ) 10 MG TABS tablet Take 1 tablet (10 mg total) by mouth daily before breakfast. 90 tablet 3   eplerenone  (INSPRA ) 50 MG tablet Take 1 tablet (50 mg total) by mouth daily. 90 tablet 3   furosemide  (LASIX ) 20 MG tablet Take 1 tablet (20 mg total) by mouth daily as needed. 90 tablet 3   omeprazole  (PRILOSEC) 20 MG capsule Take 1 capsule (20 mg total) by mouth daily. 90 capsule 0   penicillin  v potassium (VEETID) 500 MG tablet Take 1 tablet (500 mg total) by mouth every 12 (twelve) hours. 20 tablet 0   sacubitril -valsartan  (ENTRESTO ) 97-103 MG Take 1 tablet by mouth 2 (two) times daily. 180 tablet 3   traZODone  (DESYREL ) 100 MG tablet Take 1 tablet (100 mg total) by mouth at bedtime as needed for sleep. 30 tablet 3   No current facility-administered medications for this encounter.   Wt Readings from Last 3 Encounters:  03/06/24 80.1 kg (176 lb 9.6 oz)  10/09/23 88.4 kg (194 lb 12.8 oz)  03/30/23 94.2 kg (207 lb 9.6 oz)   BP 100/66   Pulse 88   Wt 80.1 kg (176 lb 9.6 oz)   SpO2 97%   BMI 26.08 kg/m   Physical  Exam  ReDs 31%, normal  GENERAL: NAD Lungs- clear  CARDIAC:  JVP not elevated          Normal rate with regular rhythm. No MRG, no LEE  ABDOMEN: Soft, non-tender, non-distended.  EXTREMITIES: Warm and well perfused.  NEUROLOGIC: No obvious FND   ECG (personally reviewed): NSR 82 bpm, 1st deg AVB   Assessment/Plan: 1. 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  Echo in 5/22 with EF 15%, moderate LV dilation, mildly decreased RV function, mild Kyle. Ischemic cardiomyopathy based  on 6/22 cath. RHC in 5/22 showed normal cardiac index.  Echo 2/23 with EF 25-30%, severe LV dilation with WMA, nl RV, mild Kyle. Repeat 12/24 with EF 30-35%. Echo done today, interpretation pending. NYHA Class II, stable. Euvolemic on exam and by ReDs, 31% - Continue Entresto  97-103 mg bid  - Continue Jardiance  10 mg daily  - Continue eplerenone  50 mg daily (failed spiro d/t gynecomastia) - Continue Digoxin  0.0625 mg. Check dig level  - He has been off coreg  x 3 days due to low BP at home (18.75 mg dose), BP today 100/66. Will keep off and start Toprol XL 12.5 mg at bedtime  - Continue Lasix  20 mg every other day - check BMP today  - Repeat echo completed today, awaiting interpretation. If EF remains < 35%, will refer to EP for ICD. Not CRT candidate with narrow QRS. 2. CAD: Cath 6/22 with occluded ostial PDA, mid ramus, and proximal LAD, all with collaterals. He saw Dr. Lucas.  Given lack of chest pain and poor targets for CABG, CABG was not recommended. He continues to deny chest pain  - Continue ASA 81  - Continue Atorvastatin  40 mg. Check LP and HFTs today    3.  ETOH abuse: He has not had anything to drink since leaving the hospital in 2022. This may have contributed to his cardiomyopathy. - he denies any further heavy/habitual use  4. Cocaine abuse: This was rare and he has not used since 2022.  5. H/o MRSA bacteremia: ?PNA source. TEE 5/22 showed no endocarditis. Completed  abx   F/u w/ Dr. Rolan in 3 months.   Caffie Shed, PA-C 03/06/2024

## 2024-03-06 NOTE — Patient Instructions (Signed)
 STOP Carvedilol   START Toprol XL 12.5 mg ( 1/2 tab) nightly.  Labs done today, your results will be available in MyChart, we will contact you for abnormal readings.  Your physician recommends that you schedule a follow-up appointment in: 2 months.  If you have any questions or concerns before your next appointment please send us  a message through [place] or call our office at [phone].    TO LEAVE A MESSAGE FOR THE NURSE SELECT OPTION 2, PLEASE LEAVE A MESSAGE INCLUDING: YOUR NAME DATE OF BIRTH CALL BACK NUMBER REASON FOR CALL**this is important as we prioritize the call backs  YOU WILL RECEIVE A CALL BACK THE SAME DAY AS LONG AS YOU CALL BEFORE 4:00 PM  At the [facility], you and your health needs are our priority. As part of our continuing mission to provide you with exceptional heart care, we have created designated Provider Care Teams. These Care Teams include your primary Cardiologist (physician) and Advanced Practice Providers (APPs- Physician Assistants and Nurse Practitioners) who all work together to provide you with the care you need, when you need it.   You may see any of the following providers on your designated Care Team at your next follow up: Dr Toribio Fuel Dr Ezra Shuck Dr. Ria Commander Dr. Morene Brownie Amy Lenetta, NP Caffie Shed, [place] [facility] Harris, [place] Beckey Coe, NP [place] Lee, NP Ellouise Class, NP Tinnie Redman, PharmD Jaun Bash, PharmD   Please be sure to bring in all your medications bottles to every appointment.    Thank you for choosing [facility]

## 2024-03-09 ENCOUNTER — Ambulatory Visit (HOSPITAL_COMMUNITY): Payer: Self-pay | Admitting: Cardiology

## 2024-03-12 ENCOUNTER — Telehealth (HOSPITAL_COMMUNITY): Payer: Self-pay | Admitting: Licensed Clinical Social Worker

## 2024-03-12 NOTE — Telephone Encounter (Signed)
 H&V Care Navigation CSW Progress Note  Clinical Social Worker contacted patient by phone to arrange transportation.  CSW informed that MD wanted to see pt tomorrow due to concerns with volume overload and pt had no way to get here as insurance required 3 day notice.  CSW called insurance to request exception given medical concerns and they were able to arrange ride.  Pick up from appt: 3:30pm  Patient is participating in a Managed Medicaid Plan:  Yes  SDOH Screenings   Food Insecurity: Food Insecurity Present (03/13/2022)  Housing: Low Risk  (03/13/2022)  Transportation Needs: No Transportation Needs (03/12/2024)  Financial Resource Strain: High Risk (03/13/2022)  Tobacco Use: Medium Risk (03/06/2024)   Andriette HILARIO Leech, LCSW Clinical Social Worker [facility] Desk#: [phone] Cell#: [phone]

## 2024-03-13 ENCOUNTER — Other Ambulatory Visit (HOSPITAL_BASED_OUTPATIENT_CLINIC_OR_DEPARTMENT_OTHER): Payer: Self-pay

## 2024-03-13 ENCOUNTER — Other Ambulatory Visit (HOSPITAL_COMMUNITY): Payer: Self-pay

## 2024-03-13 ENCOUNTER — Ambulatory Visit (HOSPITAL_COMMUNITY)
Admission: RE | Admit: 2024-03-13 | Discharge: 2024-03-13 | Disposition: A | Discharge status: Home / Self Care | Source: Ambulatory Visit | Attending: Cardiology | Admitting: Cardiology

## 2024-03-13 VITALS — BP 126/66 | HR 85 | Wt 180.0 lb

## 2024-03-13 DIAGNOSIS — Z7984 Long term (current) use of oral hypoglycemic drugs: Secondary | ICD-10-CM | POA: Diagnosis not present

## 2024-03-13 DIAGNOSIS — Z8614 Personal history of Methicillin resistant Staphylococcus aureus infection: Secondary | ICD-10-CM | POA: Insufficient documentation

## 2024-03-13 DIAGNOSIS — I878 Other specified disorders of veins: Secondary | ICD-10-CM | POA: Diagnosis not present

## 2024-03-13 DIAGNOSIS — F1410 Cocaine abuse, uncomplicated: Secondary | ICD-10-CM | POA: Diagnosis not present

## 2024-03-13 DIAGNOSIS — F1010 Alcohol abuse, uncomplicated: Secondary | ICD-10-CM | POA: Insufficient documentation

## 2024-03-13 DIAGNOSIS — Z79899 Other long term (current) drug therapy: Secondary | ICD-10-CM | POA: Diagnosis not present

## 2024-03-13 DIAGNOSIS — Z7982 Long term (current) use of aspirin: Secondary | ICD-10-CM | POA: Insufficient documentation

## 2024-03-13 DIAGNOSIS — I255 Ischemic cardiomyopathy: Secondary | ICD-10-CM | POA: Insufficient documentation

## 2024-03-13 DIAGNOSIS — I110 Hypertensive heart disease with heart failure: Secondary | ICD-10-CM | POA: Insufficient documentation

## 2024-03-13 DIAGNOSIS — F17290 Nicotine dependence, other tobacco product, uncomplicated: Secondary | ICD-10-CM | POA: Diagnosis not present

## 2024-03-13 DIAGNOSIS — I2510 Atherosclerotic heart disease of native coronary artery without angina pectoris: Secondary | ICD-10-CM | POA: Insufficient documentation

## 2024-03-13 DIAGNOSIS — I5022 Chronic systolic (congestive) heart failure: Secondary | ICD-10-CM | POA: Diagnosis not present

## 2024-03-13 MED ORDER — METOPROLOL SUCCINATE ER 25 MG PO TB24
25.0000 mg | ORAL_TABLET | Freq: Every day | ORAL | 3 refills | Status: DC
  Filled 2024-03-13 – 2024-03-17 (×3): qty 90, 90d supply, fill #0

## 2024-03-13 NOTE — Progress Notes (Signed)
 ReDS Vest / Clip - 03/13/24 1400       ReDS Vest / Clip   Station Marker C    Ruler Value 30    ReDS Value Range Low volume    ReDS Actual Value 34

## 2024-03-13 NOTE — Patient Instructions (Signed)
 CHANGE Toprol XL to 25 mg daily.  KEEP FOLLOW UP AS SCHEDULED.  If you have any questions or concerns before your next appointment please send us  a message through [place] or call our office at [phone].    TO LEAVE A MESSAGE FOR THE NURSE SELECT OPTION 2, PLEASE LEAVE A MESSAGE INCLUDING: YOUR NAME DATE OF BIRTH CALL BACK NUMBER REASON FOR CALL**this is important as we prioritize the call backs  YOU WILL RECEIVE A CALL BACK THE SAME DAY AS LONG AS YOU CALL BEFORE 4:00 PM  At the [facility], you and your health needs are our priority. As part of our continuing mission to provide you with exceptional heart care, we have created designated Provider Care Teams. These Care Teams include your primary Cardiologist (physician) and Advanced Practice Providers (APPs- Physician Assistants and Nurse Practitioners) who all work together to provide you with the care you need, when you need it.   You may see any of the following providers on your designated Care Team at your next follow up: Dr Toribio Fuel Dr Ezra Shuck Dr. Ria Commander Dr. Morene Brownie Amy Lenetta, NP Caffie Shed, [place] [facility] Reynolds, [place] Beckey Coe, NP [place] Lee, NP Ellouise Class, NP Tinnie Redman, PharmD Jaun Bash, PharmD   Please be sure to bring in all your medications bottles to every appointment.    Thank you for choosing [facility]

## 2024-03-14 NOTE — Progress Notes (Signed)
 ADVANCED HF CLINIC NOTE  PCP: Pcp, No HF Cardiology: Dr. Rolan  Chief complaint: CHF  HPI: Kyle Peck is a 63 y.o. with a history of arthritis, ETOH abuse, tobacco abuse, THC use, and cocaine abuse.  Presented to [facility] on 5/22 with progressive shortness of breath and fatigue. Echo showed severely reduced EF 15% and mildly reduced RV. IV Lasix  started.  He developed respiratory failure, suspect ETOH withdrawal requiring intubation. Became hypotensive requiring pressors, he developed AKI and had CVVH transiently. He was found to have MRSA bacteremia, possibly from PNA. TEE showed no endocarditis. R/LHC showed severe 3 vessel disease.  Recommended CABG but wanted MRI viability study first.  Patient decided to leave AMA.  He did get his medications prior to discharge and consented to wear a Lifevest.    He subsequently saw Dr. Lucas in the office, was not thought to have good targets for CABG.  Given lack of chest pain, CABG was not recommended.   Echo 2/23 showed EF 25-30%, severe LV dilation with wall motion abnormalities, normal RV, mild Kyle. Referred to EP for ICD but did not have insurance coverage at the time [facility] Planning). He now has insurance that will cover device.   Echo in 10/25 showed EF 35-40%, normal RV, dilated IVC.   He returns for followup of CHF post-echo which showed dilated IVC and concern for volume overload.  He is taking Lasix  20 mg daily.  Weight is up 4 lbs.  Knee pain bilaterally is his main limitation.  He denies significant exertional dyspnea or chest pain.  Shadow boxes for exercise.  No lightheadedness.  No orthopnea/PND.  He is not smoking and drinks ETOH rarely now.   ReDs 34%, normal   Labs (5/25): LDL 55 Labs (10/25): digoxin  < 0.6, K 3.7, creatinine 1.17  PMH: 1. ETOH abuse.  2. Smoking 3. Prior cocaine use.  4. MRSA bacteremia 5/22 - TEE (5/22): no endocarditis.  5. CAD: LHC (5/22) with totally occluded pLAD with collaterals, totally  occluded ramus with collaterals, 70% proximal RCA, PDA totally occluded with collaterals. CABG recommended.  6. Chronic systolic CHF: Ischemic cardiomyopathy.  Echo (5/22) with EF 15%, moderate LV enlargement, moderate RV enlargement with mildly decreased RV systolic function, mild Kyle.  - RHC (5/22): mean RA 3, PA 26/4, mean PCWP 5, CI 2.76 Fick, CI 3.01 thermo.  - Echo (3/23): EF 25-30%, severe LV dilation with wall motion abnormalities, normal RV, mild Kyle.  - Echo (10/25): EF 35-40%, normal RV, dilated IVC.  7. BPH 8. Knee OA 9. HTN  Social History   Socioeconomic History   Marital status: Married    Spouse name: Elveria   Number of children: Not on file   Years of education: Not on file   Highest education level: Not on file  Occupational History   Not on file  Tobacco Use   Smoking status: Former    Current packs/day: 0.50    Types: Cigars, Cigarettes   Smokeless tobacco: Never  Vaping Use   Vaping status: Never Used  Substance and Sexual Activity   Alcohol use: Yes    Alcohol/week: 15.0 standard drinks of alcohol    Types: 15 Shots of liquor per week    Comment: intermittently   Drug use: Yes    Frequency: 2.0 times per week    Types: Marijuana    Comment: also around cocaine before   Sexual activity: Not on file  Other Topics Concern   Not on  file  Social History Narrative   Not on file   Social Drivers of Health   Financial Resource Strain: High Risk (03/13/2022)   Overall Financial Resource Strain (CARDIA)    Difficulty of Paying Living Expenses: Very hard  Food Insecurity: Food Insecurity Present (03/13/2022)   Hunger Vital Sign    Worried About Running Out of Food in the Last Year: Sometimes true    Ran Out of Food in the Last Year: Sometimes true  Transportation Needs: No Transportation Needs (03/12/2024)   PRAPARE - Administrator, Civil Service (Medical): No    Lack of Transportation (Non-Medical): No  Physical Activity: Not on file   Stress: Not on file  Social Connections: Not on file  Intimate Partner Violence: Not on file   Family History  Problem Relation Age of Onset   Congestive Heart Failure Mother    Congestive Heart Failure Father    ROS: All systems reviewed and negative except as per HPI.   Current Outpatient Medications  Medication Sig Dispense Refill   aspirin  81 MG chewable tablet Chew 1 tablet (81 mg total) by mouth daily. 30 tablet 11   atorvastatin  (LIPITOR) 40 MG tablet Take 1 tablet (40 mg total) by mouth daily. 90 tablet 3   cefdinir  (OMNICEF ) 300 MG capsule Take 1 capsule (300 mg total) by mouth 2 (two) times daily for 10 days. 20 capsule 0   digoxin  (LANOXIN ) 0.125 MG tablet Take 0.5 tablets (0.0625 mg total) by mouth daily. 45 tablet 3   empagliflozin  (JARDIANCE ) 10 MG TABS tablet Take 1 tablet (10 mg total) by mouth daily before breakfast. 90 tablet 3   eplerenone  (INSPRA ) 50 MG tablet Take 1 tablet (50 mg total) by mouth daily. 90 tablet 3   furosemide  (LASIX ) 20 MG tablet Take 1 tablet (20 mg total) by mouth daily as needed. 90 tablet 3   omeprazole  (PRILOSEC) 20 MG capsule Take 1 capsule (20 mg total) by mouth daily. 90 capsule 0   sacubitril -valsartan  (ENTRESTO ) 97-103 MG Take 1 tablet by mouth 2 (two) times daily. 180 tablet 3   metoprolol succinate (TOPROL XL) 25 MG 24 hr tablet Take 1 tablet (25 mg total) by mouth daily. 90 tablet 3   penicillin  v potassium (VEETID) 500 MG tablet Take 1 tablet (500 mg total) by mouth every 12 (twelve) hours. (Patient not taking: Reported on 03/06/2024) 20 tablet 0   traZODone  (DESYREL ) 100 MG tablet Take 1 tablet (100 mg total) by mouth at bedtime as needed for sleep. (Patient not taking: Reported on 03/13/2024) 30 tablet 3   No current facility-administered medications for this encounter.   Wt Readings from Last 3 Encounters:  03/13/24 81.6 kg (180 lb)  03/06/24 80.1 kg (176 lb 9.6 oz)  10/09/23 88.4 kg (194 lb 12.8 oz)   BP 126/66   Pulse 85    Wt 81.6 kg (180 lb)   SpO2 98%   BMI 26.58 kg/m   Physical Exam  General: NAD Neck: No JVD, no thyromegaly or thyroid nodule.  Lungs: Clear to auscultation bilaterally with normal respiratory effort. CV: Nondisplaced PMI.  Heart regular S1/S2, no S3/S4, no murmur.  No peripheral edema.  No carotid bruit.  Normal pedal pulses.  Abdomen: Soft, nontender, no hepatosplenomegaly, no distention.  Skin: Intact without lesions or rashes.  Neurologic: Alert and oriented x 3.  Psych: Normal affect. Extremities: No clubbing or cyanosis.  HEENT: Normal.   Assessment/Plan: 1. Chronic systolic CHF: Probably mixed ischemic/nonischemic (  ETOH) cardiomyopathy.  Echo in 5/22 with EF 15%, moderate LV dilation, mildly decreased RV function, mild Kyle. Ischemic cardiomyopathy based on 6/22 cath. RHC in 5/22 showed normal cardiac index.  Echo 2/23 with EF 25-30%, severe LV dilation with WMA, nl RV, mild Kyle. Repeat 12/24 with EF 30-35%. Echo today showed slightly higher EF 35-40%, normal RV, dilated IVC. He is generally avoiding ETOH.  NYHA class I-II.  Though IVC dilated on echo, he does not look volume overloaded on exam and REDS clip reading of 34% is not elevated.  - Continue Entresto  97-103 mg bid  - Continue Jardiance  10 mg daily  - Continue eplerenone  50 mg daily (failed spiro d/t gynecomastia) - Continue Digoxin  0.0625 mg.  - Transitioned from Coreg  to Toprol XL due to low BP.  Will increase Toprol XL to 25 mg daily today.  - Continue Lasix  20 mg daily.  - EF now appears just out of ICD range. 2. CAD: Cath 6/22 with occluded ostial PDA, mid ramus, and proximal LAD, all with collaterals. He saw Dr. Lucas.  Given lack of chest pain and poor targets for CABG, CABG was not recommended. He continues to deny chest pain  - Continue ASA 81  - Continue Atorvastatin  40 mg. Good LDL in 4/25.  3.  ETOH abuse: This may have contributed to his cardiomyopathy. Rare ETOH now.  4. Cocaine abuse: This was rare and he has  not used since 2022.  5. H/o MRSA bacteremia: ?PNA source. TEE 5/22 showed no endocarditis. Completed abx   Followup in 3 months.   I spent 31 minutes reviewing data, interviewing patient, and organizing the orders/followup.   Ezra Shuck, MD 03/14/2024

## 2024-03-17 ENCOUNTER — Other Ambulatory Visit (HOSPITAL_BASED_OUTPATIENT_CLINIC_OR_DEPARTMENT_OTHER): Payer: Self-pay

## 2024-03-17 ENCOUNTER — Other Ambulatory Visit: Payer: Self-pay

## 2024-03-17 ENCOUNTER — Other Ambulatory Visit (HOSPITAL_COMMUNITY): Payer: Self-pay

## 2024-04-07 ENCOUNTER — Encounter: Payer: Self-pay | Admitting: Radiology

## 2024-04-16 ENCOUNTER — Other Ambulatory Visit (HOSPITAL_COMMUNITY): Payer: Self-pay | Admitting: Otolaryngology

## 2024-04-16 DIAGNOSIS — J387 Other diseases of larynx: Secondary | ICD-10-CM

## 2024-04-16 DIAGNOSIS — Z87891 Personal history of nicotine dependence: Secondary | ICD-10-CM

## 2024-04-16 DIAGNOSIS — J3489 Other specified disorders of nose and nasal sinuses: Secondary | ICD-10-CM

## 2024-04-17 ENCOUNTER — Other Ambulatory Visit (HOSPITAL_BASED_OUTPATIENT_CLINIC_OR_DEPARTMENT_OTHER): Payer: Self-pay

## 2024-04-18 ENCOUNTER — Ambulatory Visit (HOSPITAL_COMMUNITY)
Admission: RE | Admit: 2024-04-18 | Discharge: 2024-04-18 | Disposition: A | Discharge status: Home / Self Care | Source: Ambulatory Visit | Attending: Otolaryngology | Admitting: Otolaryngology

## 2024-04-18 DIAGNOSIS — J3489 Other specified disorders of nose and nasal sinuses: Secondary | ICD-10-CM | POA: Insufficient documentation

## 2024-04-18 DIAGNOSIS — Z87891 Personal history of nicotine dependence: Secondary | ICD-10-CM | POA: Insufficient documentation

## 2024-04-18 DIAGNOSIS — J387 Other diseases of larynx: Secondary | ICD-10-CM | POA: Insufficient documentation

## 2024-04-18 MED ORDER — IOHEXOL 300 MG/ML  SOLN
75.0000 mL | Freq: Once | INTRAMUSCULAR | Status: AC | PRN
Start: 2024-04-18 — End: 2024-04-18
  Administered 2024-04-18: 75 mL via INTRAVENOUS

## 2024-04-23 ENCOUNTER — Other Ambulatory Visit (HOSPITAL_BASED_OUTPATIENT_CLINIC_OR_DEPARTMENT_OTHER): Payer: Self-pay

## 2024-04-23 MED ORDER — TRAMADOL HCL 50 MG PO TABS
100.0000 mg | ORAL_TABLET | Freq: Four times a day (QID) | ORAL | 1 refills | Status: DC
  Filled 2024-04-23: qty 60, 8d supply, fill #0
  Filled 2024-05-13: qty 60, 8d supply, fill #1

## 2024-04-28 ENCOUNTER — Other Ambulatory Visit (HOSPITAL_BASED_OUTPATIENT_CLINIC_OR_DEPARTMENT_OTHER): Payer: Self-pay

## 2024-04-30 ENCOUNTER — Other Ambulatory Visit (HOSPITAL_BASED_OUTPATIENT_CLINIC_OR_DEPARTMENT_OTHER): Payer: Self-pay

## 2024-04-30 NOTE — Telephone Encounter (Addendum)
 Good morning, Mr. Petrosino   It was a pleasure speaking with you. As per our phone conversation, I have included the information for Direct Laryngoscopy with [id number] surgery.   Date:  06/19/2023 Location: [facility] [address] [place], [place] [address] Phone Number: [phone] Time to arrive:     7:45 AM       (If there is a cancellation the facility will give you a new time to arrive) Surgery Start Time: 09:45 AM Instructions: Someone from the [facility] pre-op team will be in contact with you to get a medical history and work-up prior to the surgery date. Nothing to eat or drink after midnight unless otherwise authorized by the pre-op anesthesia team.     Your post op appointment is on with Dr. JESUS at [address] [place], [place] [address].   Please call Dr. JESUS assistants PAMELA/JAVIER at [phone] if: You have any pharmacy issues or your pharmacy has changed since the last office visit You need FMLA paperwork filled out or a doctors note You have any medical questions pertaining to your surgery You have any post-surgical concerns or complications You need to reschedule a post-op appointment     Best Regards,   Avelina Mad Surgical Coordinator   [facility] [place] [address] [place], [place]  [address] Phone [phone]/ Fax [phone]   [facility] is now [facility]

## 2024-05-05 ENCOUNTER — Encounter (HOSPITAL_COMMUNITY): Payer: Self-pay | Admitting: Otolaryngology

## 2024-05-05 NOTE — Progress Notes (Addendum)
 Patient stated that the procedure would need to be rescheduled due to a spider bite on his left elbow and due to the fact that since he lost saw Dr. Jesus he has passed tonsil stones and he wants to have further imaging before having a procedure.  Patient states that he went to urgent care on Friday for the spider bite on his left elbow and was started on antibiotics and it is not improving.    Avelina at Dr. Godfrey office was made aware that the patient was asking to reschedule the procedure.    Update: 1221 - left message for Avelina that patient should have cardiac clearance if procedure is rescheduled.

## 2024-05-05 NOTE — H&P (Signed)
 HPI:   Kyle Peck is a 63 y.o. male who presents as a consult Patient.   Referring Provider: No ref. provider found  Chief complaint: Tonsil and nose problems.  HPI: He has a long and complicated history. He had a facial fracture years ago playing football. He had surgical reconstruction of the left maxilla. He has had problems with crusty buildup in his nose for years. He used cocaine for about 6 or 7 years years ago. He used to smoke and drink but no longer does. He has some heart issues as well. He is having intermittent left ear pain. He denies any loss of hearing. He has had problems with tonsil stones. He has been treated with antibiotics multiple times. He is not having any difficulty breathing.  PMH/Meds/All/SocHx/FamHx/ROS:   Medical History[1]  Surgical History[2]  No family history of bleeding disorders, wound healing problems or difficulty with anesthesia.     Current Medications[3]  A complete ROS was performed with pertinent positives/negatives noted in the HPI. The remainder of the ROS are negative.   Physical Exam:   BP 111/73 (BP Location: Left arm, Patient Position: Sitting)  Pulse 84  Temp 98.7 F (37.1 C) (Temporal)  Wt 80.6 kg (177 lb 12.8 oz)   General: Healthy and alert, in no distress, breathing easily. Normal affect. In a pleasant mood. Head: Normocephalic, atraumatic. No masses, or scars. Eyes: Pupils are equal, and reactive to light. Vision is grossly intact. No spontaneous or gaze nystagmus. Ears: Ear canals are clear. Tympanic membranes are intact, with normal landmarks and the middle ears are clear and healthy. Hearing: Grossly normal. Nose: Nasal cavities reveal a very large septal perforation with crusting around the edges. Face: No masses or scars, facial nerve function is symmetric. Oral Cavity: No mucosal abnormalities are noted. Tongue with normal mobility. Dentition in very poor repair. Oropharynx: Tonsils are symmetric, not enlarged,  with multiple small mucosal cysts on the surface. There are no mucosal masses identified. Tongue base appears normal and healthy. Larynx/Hypopharynx: Indirect examination reveals a supraglottic mass involving the epiglottis. Cords seem to be patent and move well. Chest: Deferred Neck: No palpable masses, no cervical adenopathy, no thyroid nodules or enlargement. Neuro: Cranial nerves II-XII with normal function. Balance: Normal gate. Other findings: none.  Independent Review of Additional Tests or Records:  none  Procedures:  none  Impression & Plans:  1. 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 Recommend frequent use of saline nasal spray.  2. 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 Recommend CT imaging and then will schedule for direct laryngoscopy with biopsy under anesthesia. We had a long talk about the ramifications if this is carcinoma. There is no palpable lymph nodes. CT will demonstrate the extent of the disease but also if there is any suspicious lymph nodes.  3. Left ear pain, no ear disease identified. 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  4. Very poor dentition.

## 2024-05-06 ENCOUNTER — Telehealth: Payer: Self-pay | Admitting: Cardiology

## 2024-05-06 NOTE — Telephone Encounter (Signed)
   Patient Name: Kyle Peck  DOB: [birth year] MRN: [id number]  Primary Cardiologist: Alvan Carrier, MD  Chart reviewed as part of pre-operative protocol coverage.  In reviewing chart, it appears that patient's procedure has been canceled.  Please send an updated clearance request once procedure has been rescheduled.  I will route this recommendation to the requesting party via Epic fax function and remove from pre-op pool.  Please call with questions.  Damien JAYSON Braver, NP 05/06/2024, 12:23 PM

## 2024-05-06 NOTE — Telephone Encounter (Signed)
   Pre-operative Risk Assessment    Patient Name: Kyle Peck  DOB: 12-Nov-1960 MRN: [id number]   Date of last office visit: 03/13/24 Date of next office visit: 06/06/24   Request for Surgical Clearance    Procedure:  Direct laryngoscopy with biopsy    Date of Surgery:  Clearance TBD                                Surgeon:  Dr. Nonda Loader  Surgeon's Group or Practice Name:  [facility]  Phone number:  [phone]  Fax number:  [phone]   Type of Clearance Requested:   - Medical  - Pharmacy:  Hold defer to cardiologist       Type of Anesthesia:  General    Additional requests/questions:    SignedLarraine Salt   05/06/2024, 9:20 AM    Office states procedure is urgent.

## 2024-05-07 ENCOUNTER — Encounter (HOSPITAL_COMMUNITY): Admission: RE | Payer: Self-pay | Source: Home / Self Care

## 2024-05-07 ENCOUNTER — Ambulatory Visit (HOSPITAL_COMMUNITY): Admission: RE | Admit: 2024-05-07 | Source: Home / Self Care | Admitting: Otolaryngology

## 2024-05-07 HISTORY — DX: Essential (primary) hypertension: I10

## 2024-05-07 HISTORY — DX: Heart failure, unspecified: I50.9

## 2024-05-07 SURGERY — LARYNGOSCOPY, DIRECT
Anesthesia: General

## 2024-05-13 ENCOUNTER — Other Ambulatory Visit (HOSPITAL_BASED_OUTPATIENT_CLINIC_OR_DEPARTMENT_OTHER): Payer: Self-pay

## 2024-05-13 ENCOUNTER — Other Ambulatory Visit (HOSPITAL_COMMUNITY): Payer: Self-pay

## 2024-05-20 ENCOUNTER — Telehealth: Payer: Self-pay | Admitting: Cardiology

## 2024-05-20 NOTE — Telephone Encounter (Signed)
 [facility] called to f/u please advise

## 2024-05-20 NOTE — Telephone Encounter (Signed)
 Will update requesting office the pt has appt 06/02/24 with [facility].

## 2024-05-20 NOTE — Telephone Encounter (Signed)
 Error

## 2024-05-20 NOTE — Telephone Encounter (Signed)
 I have reviewed epic and looks like procedure has been changed to 06/18/24.

## 2024-05-30 ENCOUNTER — Other Ambulatory Visit (HOSPITAL_BASED_OUTPATIENT_CLINIC_OR_DEPARTMENT_OTHER): Payer: Self-pay

## 2024-05-30 MED ORDER — TRAMADOL HCL 50 MG PO TABS
100.0000 mg | ORAL_TABLET | Freq: Four times a day (QID) | ORAL | 1 refills | Status: AC | PRN
  Filled 2024-05-30: qty 60, 8d supply, fill #0
  Filled 2024-06-16: qty 60, 8d supply, fill #1

## 2024-06-02 ENCOUNTER — Encounter (HOSPITAL_COMMUNITY): Payer: Self-pay

## 2024-06-02 ENCOUNTER — Ambulatory Visit (HOSPITAL_COMMUNITY)
Admission: RE | Admit: 2024-06-02 | Discharge: 2024-06-02 | Disposition: A | Discharge status: Home / Self Care | Source: Ambulatory Visit | Attending: Physician Assistant | Admitting: Physician Assistant

## 2024-06-02 VITALS — BP 110/60 | HR 100 | Wt 162.0 lb

## 2024-06-02 DIAGNOSIS — I255 Ischemic cardiomyopathy: Secondary | ICD-10-CM | POA: Insufficient documentation

## 2024-06-02 DIAGNOSIS — Z7982 Long term (current) use of aspirin: Secondary | ICD-10-CM | POA: Insufficient documentation

## 2024-06-02 DIAGNOSIS — I110 Hypertensive heart disease with heart failure: Secondary | ICD-10-CM | POA: Diagnosis not present

## 2024-06-02 DIAGNOSIS — F1290 Cannabis use, unspecified, uncomplicated: Secondary | ICD-10-CM | POA: Diagnosis not present

## 2024-06-02 DIAGNOSIS — F1010 Alcohol abuse, uncomplicated: Secondary | ICD-10-CM | POA: Diagnosis not present

## 2024-06-02 DIAGNOSIS — I2510 Atherosclerotic heart disease of native coronary artery without angina pectoris: Secondary | ICD-10-CM | POA: Insufficient documentation

## 2024-06-02 DIAGNOSIS — F1410 Cocaine abuse, uncomplicated: Secondary | ICD-10-CM | POA: Diagnosis not present

## 2024-06-02 DIAGNOSIS — I5022 Chronic systolic (congestive) heart failure: Secondary | ICD-10-CM | POA: Insufficient documentation

## 2024-06-02 DIAGNOSIS — Z01810 Encounter for preprocedural cardiovascular examination: Secondary | ICD-10-CM | POA: Diagnosis not present

## 2024-06-02 DIAGNOSIS — Z79899 Other long term (current) drug therapy: Secondary | ICD-10-CM | POA: Insufficient documentation

## 2024-06-02 DIAGNOSIS — Z8614 Personal history of Methicillin resistant Staphylococcus aureus infection: Secondary | ICD-10-CM | POA: Diagnosis not present

## 2024-06-02 DIAGNOSIS — Z7984 Long term (current) use of oral hypoglycemic drugs: Secondary | ICD-10-CM | POA: Diagnosis not present

## 2024-06-02 DIAGNOSIS — F17290 Nicotine dependence, other tobacco product, uncomplicated: Secondary | ICD-10-CM | POA: Insufficient documentation

## 2024-06-02 DIAGNOSIS — R1310 Dysphagia, unspecified: Secondary | ICD-10-CM | POA: Insufficient documentation

## 2024-06-02 DIAGNOSIS — I878 Other specified disorders of veins: Secondary | ICD-10-CM | POA: Insufficient documentation

## 2024-06-02 LAB — BASIC METABOLIC PANEL WITH GFR
Anion gap: 10 (ref 5–15)
BUN: 31 mg/dL — ABNORMAL HIGH (ref 8–23)
CO2: 29 mmol/L (ref 22–32)
Calcium: 9.5 mg/dL (ref 8.9–10.3)
Chloride: 96 mmol/L — ABNORMAL LOW (ref 98–111)
Creatinine, Ser: 1.18 mg/dL (ref 0.61–1.24)
GFR, Estimated: >60 mL/min
Glucose, Bld: 205 mg/dL — ABNORMAL HIGH (ref 70–99)
Potassium: 3.9 mmol/L (ref 3.5–5.1)
Sodium: 136 mmol/L (ref 135–145)

## 2024-06-02 MED ORDER — METOPROLOL SUCCINATE ER 25 MG PO TB24: 12.5000 mg | ORAL_TABLET | Freq: Every day | ORAL | Status: DC

## 2024-06-02 MED ORDER — METOPROLOL SUCCINATE ER 25 MG PO TB24: 12.5000 mg | ORAL_TABLET | Freq: Every day | ORAL | Status: AC

## 2024-06-02 NOTE — Progress Notes (Signed)
 "   ADVANCED HF CLINIC NOTE  PCP: Pcp, No HF Cardiology: Dr. Rolan  Chief complaint: CHF  HPI: Mr Sans is a 63 y.o. with a history of arthritis, ETOH abuse, tobacco abuse, THC use, and cocaine abuse.  Presented to [facility] on 5/22 with progressive shortness of breath and fatigue. Echo showed severely reduced EF 15% and mildly reduced RV. IV Lasix  started.  He developed respiratory failure, suspect ETOH withdrawal requiring intubation. Became hypotensive requiring pressors, he developed AKI and had CVVH transiently. He was found to have MRSA bacteremia, possibly from PNA. TEE showed no endocarditis. R/LHC showed severe 3 vessel disease.  Recommended CABG but wanted MRI viability study first.  Patient decided to leave AMA.  He did get his medications prior to discharge and consented to wear a Lifevest.    He subsequently saw Dr. Lucas in the office, was not thought to have good targets for CABG.  Given lack of chest pain, CABG was not recommended.   Echo 2/23 showed EF 25-30%, severe LV dilation with wall motion abnormalities, normal RV, mild MR. Referred to EP for ICD but did not have insurance coverage at the time [facility]). He now has insurance that will cover device.   Echo in 10/25 showed EF 35-40%, normal RV, dilated IVC.   He is here today for CHF follow-up and pre-op assessment for laryngoscopy w/ possible biopsy under general anesthesia. 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 Denies chest pain, shortness of breath, orthopnea or PND. Reports he has been losing weight recently d/t trouble swallowing. Down 18 lb from last visit. Compliant with all medications. He has been off metoprolol  X 1 day as he thought it may be making him lightheaded. Feels better off the medicine today.   No current ETOH or tobacco use.    Labs (5/25): LDL 55 Labs (10/25): digoxin  < 0.6, K 3.7, creatinine 1.17  PMH: 1. ETOH abuse.  2. Smoking 3. Prior cocaine use.  4. MRSA  bacteremia 5/22 - TEE (5/22): no endocarditis.  5. CAD: LHC (5/22) with totally occluded pLAD with collaterals, totally occluded ramus with collaterals, 70% proximal RCA, PDA totally occluded with collaterals. CABG recommended.  6. Chronic systolic CHF: Ischemic cardiomyopathy.  Echo (5/22) with EF 15%, moderate LV enlargement, moderate RV enlargement with mildly decreased RV systolic function, mild MR.  - RHC (5/22): mean RA 3, PA 26/4, mean PCWP 5, CI 2.76 Fick, CI 3.01 thermo.  - Echo (3/23): EF 25-30%, severe LV dilation with wall motion abnormalities, normal RV, mild MR.  - Echo (10/25): EF 35-40%, normal RV, dilated IVC.  7. BPH 8. Knee OA 9. HTN  Social History   Socioeconomic History   Marital status: Married    Spouse name: Elveria   Number of children: Not on file   Years of education: Not on file   Highest education level: Not on file  Occupational History   Not on file  Tobacco Use   Smoking status: Former    Current packs/day: 0.50    Types: Cigars, Cigarettes   Smokeless tobacco: Never  Vaping Use   Vaping status: Never Used  Substance and Sexual Activity   Alcohol use: Not Currently    Alcohol/week: 15.0 standard drinks of alcohol    Types: 15 Shots of liquor per week    Comment: intermittently   Drug use: Yes    Frequency: 2.0 times per week    Types: Marijuana    Comment: also around cocaine  before   Sexual activity: Not on file  Other Topics Concern   Not on file  Social History Narrative   Not on file   Social Drivers of Health   Tobacco Use: Medium Risk (06/02/2024)   Patient History    Smoking Tobacco Use: Former    Smokeless Tobacco Use: Never    Passive Exposure: Not on file  Financial Resource Strain: High Risk (03/13/2022)   Overall Financial Resource Strain (CARDIA)    Difficulty of Paying Living Expenses: Very hard  Food Insecurity: Food Insecurity Present (03/13/2022)   Hunger Vital Sign    Worried About Running Out of Food in the  Last Year: Sometimes true    Ran Out of Food in the Last Year: Sometimes true  Transportation Needs: No Transportation Needs (03/12/2024)   Epic    Lack of Transportation (Medical): No    Lack of Transportation (Non-Medical): No  Physical Activity: Not on file  Stress: Not on file  Social Connections: Not on file  Intimate Partner Violence: Not on file  Depression (EYV7-0): Not on file  Alcohol Screen: Not on file  Housing: Low Risk (03/13/2022)   Housing    Last Housing Risk Score: 0  Utilities: Not on file  Health Literacy: Not on file   Family History  Problem Relation Age of Onset   Congestive Heart Failure Mother    Congestive Heart Failure Father    ROS: All systems reviewed and negative except as per HPI.   Current Outpatient Medications  Medication Sig Dispense Refill   aspirin  81 MG chewable tablet Chew 1 tablet (81 mg total) by mouth daily. 30 tablet 11   atorvastatin  (LIPITOR) 40 MG tablet Take 1 tablet (40 mg total) by mouth daily. 90 tablet 3   cefdinir  (OMNICEF ) 300 MG capsule Take 1 capsule (300 mg total) by mouth 2 (two) times daily for 10 days. 20 capsule 0   digoxin  (LANOXIN ) 0.125 MG tablet Take 0.5 tablets (0.0625 mg total) by mouth daily. 45 tablet 3   empagliflozin  (JARDIANCE ) 10 MG TABS tablet Take 1 tablet (10 mg total) by mouth daily before breakfast. 90 tablet 3   eplerenone  (INSPRA ) 50 MG tablet Take 1 tablet (50 mg total) by mouth daily. 90 tablet 3   furosemide  (LASIX ) 20 MG tablet Take 1 tablet (20 mg total) by mouth daily as needed. 90 tablet 3   omeprazole  (PRILOSEC) 20 MG capsule Take 1 capsule (20 mg total) by mouth daily. 90 capsule 0   sacubitril -valsartan  (ENTRESTO ) 97-103 MG Take 1 tablet by mouth 2 (two) times daily. 180 tablet 3   traMADol  (ULTRAM ) 50 MG tablet Take 2 tablets (100 mg total) by mouth every 6 (six) hours  as needed for moderate pain (4-6). 60 tablet 1   traMADol  (ULTRAM ) 50 MG tablet Take 2 tablets (100 mg total) by mouth every  6 (six) hours as needed. 60 tablet 1   metoprolol  succinate (TOPROL  XL) 25 MG 24 hr tablet Take 0.5 tablets (12.5 mg total) by mouth at bedtime.     penicillin  v potassium (VEETID) 500 MG tablet Take 1 tablet (500 mg total) by mouth every 12 (twelve) hours. (Patient not taking: Reported on 06/02/2024) 20 tablet 0   traZODone  (DESYREL ) 100 MG tablet Take 1 tablet (100 mg total) by mouth at bedtime as needed for sleep. (Patient not taking: Reported on 06/02/2024) 30 tablet 3   No current facility-administered medications for this encounter.   Wt Readings from Last 3 Encounters:  06/02/24 73.5 kg (162 lb)  03/13/24 81.6 kg (180 lb)  03/06/24 80.1 kg (176 lb 9.6 oz)   BP 110/60   Pulse 100   Wt 73.5 kg (162 lb)   SpO2 98%   BMI 23.92 kg/m   Physical Exam  General:  Chronically ill appearing. Neck: no JVD Cor: Regular rate & rhythm. No murmurs. Lungs: clear Abdomen: soft, nontender, nondistended. Extremities: no edema Neuro: alert & oriented x 3. Affect anxious.    Assessment/Plan: 1. 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  Echo in 5/22 with EF 15%, moderate LV dilation, mildly decreased RV function, mild MR. Ischemic cardiomyopathy based on 6/22 cath. RHC in 5/22 showed normal cardiac index.  Echo 2/23 with EF 25-30%, severe LV dilation with WMA, nl RV, mild MR. Repeat 12/24 with EF 30-35%. Echo today showed slightly higher EF 35-40%, normal RV, dilated IVC. He is generally avoiding ETOH.  NYHA class II.  Continue lasix  20 mg as needed. Has recently lost 20 lb d/t decreased po intake. May eventually need to scale back GDMT if continues to lose weight. - Continue Entresto  97-103 mg bid  - Continue Jardiance  10 mg daily  - Continue eplerenone  50 mg daily (failed spiro d/t gynecomastia) - Continue Digoxin  0.0625 mg.  - Transitioned from Coreg  to Toprol  XL due to low BP.  He stopped the medication recently d/t lightheadedness. Will have him restart  Toprol  XL at 12.5 mg nightly. - BMET/BNP today - EF now appears just out of ICD range. 2. CAD: Cath 6/22 with occluded ostial PDA, mid ramus, and proximal LAD, all with collaterals. He saw Dr. Lucas.  Given lack of chest pain and poor targets for CABG, CABG was not recommended. No chest pain. - Continue ASA 81  - Continue Atorvastatin  40 mg. Good LDL in 4/25.  3.  ETOH abuse: This may have contributed to his cardiomyopathy. Rare ETOH now.  4. Cocaine abuse: This was rare and he has not used since 2022.  5. H/o MRSA bacteremia: ?PNA source. TEE 5/22 showed no endocarditis. Completed abx  6. Preoperative cardiac risk assessment: Planning to undergo laryngoscopy with possible biopsy under general anesthesia on 01/14 with Dr. Jesus. His risk for peri-procedure cardiac complications is moderately increased. Risk of major cardiac event is 5% using RCRI risk calculator. He is on maximally tolerated GDMT and appears optimized for HF standpoint. I do not see any contraindication to proceed with planned procedure. He should hold Jardiance  3 days prior to the procedure and resume afterwards. Okay to hold aspirin  if needed prior to the procedure.  Followup in 3 months with Dr. Rolan    [facility], MANUELITA SAILOR, PA-C 06/02/2024 "

## 2024-06-02 NOTE — Addendum Note (Signed)
 Encounter addended by: Buell Powell HERO, RN on: 06/02/2024 4:32 PM  Actions taken: Order list changed, Diagnosis association updated

## 2024-06-02 NOTE — Patient Instructions (Signed)
 Medication Changes:  START Metoprolol  XL 12.5 mg (1/2 tab ) daily at bedtime  **OK to HOLD Jardiance  3 days prior to procedure and to hold Aspirin  (please ask your ENT how long to hold this)  Lab Work:  Labs done today, your results will be available in MyChart, we will contact you for abnormal readings.   Special Instructions // Education:  Do the following things EVERYDAY: Weigh yourself in the morning before breakfast. Write it down and keep it in a log. Take your medicines as prescribed Eat low salt foods--Limit salt (sodium) to 2000 mg per day.  Stay as active as you can everyday Limit all fluids for the day to less than 2 liters   Follow-Up in: 3 months   At the [facility], you and your health needs are our priority. We have a designated team specialized in the treatment of Heart Failure. This Care Team includes your primary Heart Failure Specialized Cardiologist (physician), Advanced Practice Providers (APPs- Physician Assistants and Nurse Practitioners), and Pharmacist who all work together to provide you with the care you need, when you need it.   You may see any of the following providers on your designated Care Team at your next follow up:  Dr. Toribio Fuel Dr. Ezra Shuck Dr. Odis Brownie Greig Mosses, NP Caffie Shed, [place] [facility] [place], [place] Beckey Coe, NP Jordan Lee, NP Tinnie Redman, PharmD   Please be sure to bring in all your medications bottles to every appointment.   Need to Contact Us :  If you have any questions or concerns before your next appointment please send us  a message through [place] or call our office at [phone].    TO LEAVE A MESSAGE FOR THE NURSE SELECT OPTION 2, PLEASE LEAVE A MESSAGE INCLUDING: YOUR NAME DATE OF BIRTH CALL BACK NUMBER REASON FOR CALL**this is important as we prioritize the call backs  YOU WILL RECEIVE A CALL BACK THE SAME DAY AS LONG AS YOU CALL BEFORE 4:00 PM

## 2024-06-04 ENCOUNTER — Ambulatory Visit (HOSPITAL_COMMUNITY): Payer: Self-pay | Admitting: Physician Assistant

## 2024-06-06 ENCOUNTER — Encounter (HOSPITAL_COMMUNITY): Admitting: Cardiology

## 2024-06-10 NOTE — H&P (Signed)
 HPI:   Kyle Peck is a 64 y.o. male who presents as a consult Patient.   Referring Provider: No ref. provider found  Chief complaint: Tonsil and nose problems.  HPI: He has a long and complicated history. He had a facial fracture years ago playing football. He had surgical reconstruction of the left maxilla. He has had problems with crusty buildup in his nose for years. He used cocaine for about 6 or 7 years years ago. He used to smoke and drink but no longer does. He has some heart issues as well. He is having intermittent left ear pain. He denies any loss of hearing. He has had problems with tonsil stones. He has been treated with antibiotics multiple times. He is not having any difficulty breathing.  PMH/Meds/All/SocHx/FamHx/ROS:   Medical History[1]  Surgical History[2]  No family history of bleeding disorders, wound healing problems or difficulty with anesthesia.     Current Medications[3]  A complete ROS was performed with pertinent positives/negatives noted in the HPI. The remainder of the ROS are negative.   Physical Exam:   BP 111/73 (BP Location: Left arm, Patient Position: Sitting)  Pulse 84  Temp 98.7 F (37.1 C) (Temporal)  Wt 80.6 kg (177 lb 12.8 oz)   General: Healthy and alert, in no distress, breathing easily. Normal affect. In a pleasant mood. Head: Normocephalic, atraumatic. No masses, or scars. Eyes: Pupils are equal, and reactive to light. Vision is grossly intact. No spontaneous or gaze nystagmus. Ears: Ear canals are clear. Tympanic membranes are intact, with normal landmarks and the middle ears are clear and healthy. Hearing: Grossly normal. Nose: Nasal cavities reveal a very large septal perforation with crusting around the edges. Face: No masses or scars, facial nerve function is symmetric. Oral Cavity: No mucosal abnormalities are noted. Tongue with normal mobility. Dentition in very poor repair. Oropharynx: Tonsils are symmetric, not enlarged,  with multiple small mucosal cysts on the surface. There are no mucosal masses identified. Tongue base appears normal and healthy. Larynx/Hypopharynx: Indirect examination reveals a supraglottic mass involving the epiglottis. Cords seem to be patent and move well. Chest: Deferred Neck: No palpable masses, no cervical adenopathy, no thyroid nodules or enlargement. Neuro: Cranial nerves II-XII with normal function. Balance: Normal gate. Other findings: none.  Independent Review of Additional Tests or Records:  none  Procedures:  none  Impression & Plans:  1. 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 Recommend frequent use of saline nasal spray.  2. 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 Recommend CT imaging and then will schedule for direct laryngoscopy with biopsy under anesthesia. We had a long talk about the ramifications if this is carcinoma. There is no palpable lymph nodes. CT will demonstrate the extent of the disease but also if there is any suspicious lymph nodes.  3. Left ear pain, no ear disease identified. 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  4. Very poor dentition.

## 2024-06-16 ENCOUNTER — Encounter (HOSPITAL_COMMUNITY): Payer: Self-pay | Admitting: Otolaryngology

## 2024-06-16 ENCOUNTER — Other Ambulatory Visit (HOSPITAL_COMMUNITY): Payer: Self-pay | Admitting: Cardiology

## 2024-06-16 ENCOUNTER — Other Ambulatory Visit (HOSPITAL_BASED_OUTPATIENT_CLINIC_OR_DEPARTMENT_OTHER): Payer: Self-pay

## 2024-06-16 ENCOUNTER — Other Ambulatory Visit: Payer: Self-pay

## 2024-06-16 DIAGNOSIS — I5022 Chronic systolic (congestive) heart failure: Secondary | ICD-10-CM

## 2024-06-16 MED ORDER — OMEPRAZOLE 20 MG PO CPDR
20.0000 mg | DELAYED_RELEASE_CAPSULE | Freq: Every day | ORAL | 0 refills | Status: AC
  Filled 2024-06-16: qty 90, 90d supply, fill #0

## 2024-06-16 NOTE — Progress Notes (Signed)
 SDW CALL  Patient was given pre-op instructions over the phone. The opportunity was given for the patient to ask questions. No further questions asked. Patient verbalized understanding of instructions given.   PCP - patient denies at this time Cardiologist - Dr. Rolan - clearance 06/02/24  PPM/ICD - denies Device Orders -  n/a Rep Notified -  n/a  Chest x-ray - denies EKG - 03/06/24 Stress Test - denies ECHO - 03/06/24 Cardiac Cath - 11/04/20  Sleep Study - denies CPAP - n/a  NO DM  Last dose of GLP1 agonist-  n/a GLP1 instructions:  n/a  Patient takes Jardiance  for HF and was told to stop Jardiance  3 days prior by cardiologist (see note from 12/29)  Blood Thinner Instructions: n/a Aspirin  Instructions: last dose was 1/11  ERAS Protcol - NPO PRE-SURGERY Ensure or G2- n/a  COVID TEST- n/a   Anesthesia review: yes - cardiac clearance  Patient denies shortness of breath, fever, cough and chest pain over the phone call   All instructions explained to the patient, with a verbal understanding of the material. Patient agrees to go over the instructions while at home for a better understanding.

## 2024-06-17 ENCOUNTER — Encounter (HOSPITAL_COMMUNITY): Payer: Self-pay | Admitting: Otolaryngology

## 2024-06-17 NOTE — Progress Notes (Signed)
 Left msg about surgery time change to 1100. Asked patient to arrive at 0830 and remain NPO after midnight.

## 2024-06-17 NOTE — Progress Notes (Signed)
 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  History includes normal smoker, HTN, CAD (occluded pLAD & mRCA with faint collaterals, main supple large branching OM 11/2020; no graftable LAD and RCA targets 02/2021 CT surgery consult, medication therapy), HFrEF, prior polysubstance use (No current alcohol or drug use documented).  Last cardiology visit was on 06/02/2024 with Colletta Shaver, PA-C at [facility]. He initially established with cardiologist following admission in May 2022 for new diagnosis of acute systolic CHF. Also known polysubstance use at that time. Per notes,  Presented to [facility] on 5/22 with progressive shortness of breath and fatigue. Echo showed severely reduced EF 15% and mildly reduced RV. IV Lasix  started.  He developed respiratory failure, suspect ETOH withdrawal requiring intubation. Became hypotensive requiring pressors, he developed AKI and had CVVH transiently. He was found to have MRSA bacteremia, possibly from PNA. TEE showed no endocarditis. R/LHC showed severe 3 vessel disease.  Recommended CABG but wanted MRI viability study first.  Patient decided to leave AMA. He did get his medications and consented to wear a Lifevest, though he never wore. He had CT surgery follow-up, last on 02/22/2021 with Lucas Pride, MD. He wrote, He is currently [place]  Heart Association class I on medical therapy with no symptoms of ischemia or congestive heart failure.  I have personally reviewed his cardiac catheterization and echo studies.   I do not think his distal RCA or LAD are graftable and I would not recommend coronary bypass surgery in this patient.  His best long-term results are going to be with continuing medical therapy and avoiding things that may cause myocardial loss including alcohol and cigarettes.SABRASABRASABRAHe is questioning whether he needs to have the MR viability scan.  I think he should have a reassessment of his left ventricular ejection fraction to see if it is improved with medical therapy and for prognostic purposes.  He may benefit from ICD therapy if his ejection fraction remains low. He has continued follow-up with cardiology. EF in 07/2021 was 25/30%. He was referred to EP for ICD consideration, but appears this did not happen due to insurance coverage. His latest TTE in 03/2024 showed LVEF 35-40%, hypokinesis of the inferior wall and hypokinesis/ akinesis of the distal 1/3 of left ventricle, grade 1 DD, normal RV systolic function, hypermobile interatrial septum, mildly dilated RA, trivial MR.   At 06/02/2024 visit, he was see for CHF follow-up and preoperative evaluation for above procedure. He denied CP, SOB, orthopnea, PND. He had lost 18 lb since last visit due to trouble swallowing with known supraglottic mass. He was compliant with medications, but had stopped metoprolol  the day prior due to lightheadedness. No current alcohol or tobacco use. His b-blocker was transitioned to Toprol  XL 12.5 mg Q HS. Other HF medications continued, but would consider scaling back GDMT if continued with weight loss and/or hypotension. In regards to surgery, Preoperative cardiac risk assessment: Planning to undergo laryngoscopy  with possible biopsy under general anesthesia on 01/14 with Dr. Jesus. His risk for peri-procedure cardiac complications is moderately increased. Risk of major cardiac event is 5% using RCRI risk calculator. He is on maximally tolerated GDMT and appears optimized for HF standpoint. I do not see any contraindication to  proceed with planned procedure. He should hold Jardiance  3 days prior to the procedure and resume afterwards. Okay to hold aspirin  if needed prior to the procedure. 3 month follow-up planned.   He is on Jardiance  for CHF (although glucose 205 on 06/02/2024; no reported DM history).  Cardiology advised him to hold 3 days prior to surgery.  He has a same-day workup, anesthesia team to evaluate on the day of surgery.  Last aspirin  06/15/2024.  VS:  Wt Readings from Last 3 Encounters:  06/02/24 73.5 kg  03/13/24 81.6 kg  03/06/24 80.1 kg   BP Readings from Last 3 Encounters:  06/02/24 110/60  03/13/24 126/66  03/06/24 100/66   Pulse Readings from Last 3 Encounters:  06/02/24 100  03/13/24 85  03/06/24 88     PROVIDERS: Pcp, No Rolan Barrack, MD is HF cardiologist  LABS: Most recent lab results in [facility] include: Lab Results  Component Value Date   GLUCOSE 205 (H) 06/02/2024   CHOL 112 10/09/2023   TRIG 119 10/09/2023   HDL 33 (L) 10/09/2023   LDLCALC 55 10/09/2023   ALT 14 03/06/2024   AST 18 03/06/2024   NA 136 06/02/2024   K 3.9 06/02/2024   CL 96 (L) 06/02/2024   CREATININE 1.18 06/02/2024   BUN 31 (H) 06/02/2024   CO2 29 06/02/2024    IMAGES: CT Soft Tissue Neck 04/18/2024: IMPRESSION: 1. Large enhancing supraglottic mass (approx. 2.9 x 4.2 x 4.2 cm), mildly right-eccentric, extending from aryepiglottic folds to false cords, abutting but not invading the thyroid cartilage; findings compatible with laryngeal malignancy 2. Bilateral level 2 cervical lymphadenopathy (nodes up to 18 mm), suspicious for metastatic disease 3. Few faint ground-glass opacities in the right lung apex; recommend correlation with dedicated chest CT if clinically indicated 4. Perforated nasal septum (~4 cm) with eroded nasal turbinates 5. Paranasal sinus disease involving floors of the frontal sinuses and anterior ethmoid air cells 6. Moderate calcific atherosclerosis of the aortic arch  and carotid bulbs 7. Cervical spondylosis with degenerative anterolisthesis at C4-5 and multilevel disc disease (C4-5, C5-6, C6-7)  CTA Head/Neck 11/02/2020: IMPRESSION: 1. No emergent large vessel occlusion. 2. No significant proximal intracranial stenosis. 3. Mild cervical carotid atherosclerosis without significant stenosis. 4. Patent vertebral arteries with the left being strongly dominant. 5.  Aortic Atherosclerosis (ICD10-I70.0).   EKG: 03/06/2024:  Sinus rhythm with 1st degree A-V block Cannot rule out Anteroseptal infarct (cited on or before 25-Oct-2020) Abnormal ECG When compared with ECG of 30-Mar-2023 11:33, No significant change was found Confirmed by Loni Rushing ([id number]) on 03/08/2024 2:07:51 PM  CV: Echo 03/06/2024: IMPRESSIONS   1. Hypokinesis of the inferior wall and hypokinesis/ akinesis of the  distal 1/3 of left ventricle. Left ventricular ejection fraction, by  estimation, is 35 to 40%. The left ventricle has moderately decreased  function. Left ventricular diastolic  parameters are consistent with Grade I diastolic dysfunction (impaired  relaxation).   2. Right ventricular systolic function is normal. The right ventricular  size is normal.   3. Interatrial septum is hypermobile.   4. Right atrial size was mildly dilated.   5. Trivial mitral valve regurgitation.   6. The aortic valve is tricuspid.  Aortic valve regurgitation is not  visualized. Aortic valve sclerosis is present, with no evidence of aortic  valve stenosis.   7. The inferior vena cava is dilated in size with <50% respiratory  variability, suggesting right atrial pressure of 15 mmHg.  - Comparison LVEF 30-35% 05/08/2023; 25-30% 08/01/2021 & 04/25/21...15% 10/21/2020.  Cardiac cath 11/04/2020: 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 RPDA lesion is 100% stenosed. Ramus lesion is 100% stenosed. Prox LAD lesion is 100% stenosed.   1. Low filling pressures.  2. Preserved cardiac output.  3. The ostial  PDA, proximal LAD, and mid ramus are totally occluded with collaterals.     Will evaluate for CABG.  Arrange MRI viability study.  - See DISCUSSION. He did not have MRI viability study, and CT surgery did not think he had good targets for CABG. Medically managed.    Past Medical History:  Diagnosis Date   Acid reflux    Alcohol use    Arthritis    CHF (congestive heart failure) (HCC)    Hypertension    Marijuana use    Tobacco abuse     Past Surgical History:  Procedure Laterality Date   FRACTURE SURGERY     cheek    RIGHT/LEFT HEART CATH AND CORONARY ANGIOGRAPHY N/A 11/04/2020   Procedure: RIGHT/LEFT HEART CATH AND CORONARY ANGIOGRAPHY;  Surgeon: Rolan Ezra RAMAN, MD;  Location: [facility] INVASIVE CV LAB;  Service: Cardiovascular;  Laterality: N/A;    MEDICATIONS:  aspirin  81 MG chewable tablet   atorvastatin  (LIPITOR) 40 MG tablet   cholecalciferol (VITAMIN D3) 25 MCG (1000 UNIT) tablet   cyanocobalamin (VITAMIN B12) 1000 MCG tablet   digoxin  (LANOXIN ) 0.125 MG tablet   empagliflozin  (JARDIANCE ) 10 MG TABS tablet   eplerenone  (INSPRA ) 50 MG tablet   furosemide  (LASIX ) 20 MG tablet   metoprolol  succinate (TOPROL  XL) 25 MG 24 hr tablet   Multiple Vitamins-Minerals (MULTIVITAMIN WITH MINERALS) tablet   omeprazole  (PRILOSEC) 20 MG capsule   sacubitril -valsartan  (ENTRESTO ) 97-103 MG   traMADol  (ULTRAM ) 50 MG tablet   traZODone  (DESYREL ) 100 MG tablet    Isaiah Ruder, PA-C Surgical Short Stay/Anesthesiology [facility] Phone [phone] [facility] Phone [phone] 06/17/2024 12:59 PM

## 2024-06-17 NOTE — Anesthesia Preprocedure Evaluation (Signed)
 "                                  Anesthesia Evaluation  Patient identified by MRN, date of birth, ID band Patient awake    Reviewed: Allergy & Precautions, H&P , NPO status , Patient's Chart, lab work & pertinent test results, reviewed documented beta blocker date and time   History of Anesthesia Complications Negative for: history of anesthetic complications  Airway Mallampati: II  TM Distance: >3 FB Neck ROM: Full    Dental no notable dental hx. (+) Dental Advisory Given   Pulmonary neg COPD, former smoker   Pulmonary exam normal breath sounds clear to auscultation       Cardiovascular hypertension (104/58 preop), Pt. on medications and Pt. on home beta blockers + CAD and +CHF (LVEF 35-40%)  Normal cardiovascular exam Rhythm:Regular Rate:Normal  Echo 03/2024 1. Hypokinesis of the inferior wall and hypokinesis/ akinesis of the  distal 1/3 of left ventricle. Left ventricular ejection fraction, by  estimation, is 35 to 40%. The left ventricle has moderately decreased  function. Left ventricular diastolic  parameters are consistent with Grade I diastolic dysfunction (impaired  relaxation).   2. Right ventricular systolic function is normal. The right ventricular  size is normal.   3. Interatrial septum is hypermobile.   4. Right atrial size was mildly dilated.   5. Trivial mitral valve regurgitation.   6. The aortic valve is tricuspid. Aortic valve regurgitation is not  visualized. Aortic valve sclerosis is present, with no evidence of aortic  valve stenosis.   7. The inferior vena cava is dilated in size with <50% respiratory  variability, suggesting right atrial pressure of 15 mmHg.    Cath 2022:   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  RPDA lesion is 100% stenosed.  Ramus lesion is 100% stenosed.  Prox LAD lesion is 100% stenosed.   1. Low filling pressures.  2. Preserved cardiac output.  3. The ostial PDA, proximal LAD, and mid ramus are totally  occluded with collaterals.     Has been followed w/ cards since 2022 off and on again (LVEF in 2022 15% and has slowly improved, lost to followup for ICD placement). Last CT surgery eval:  I do not think his distal RCA or LAD are graftable and I would not recommend coronary bypass surgery in this patient.  His best long-term results are going to be with continuing medical therapy and avoiding things that may cause myocardial loss including alcohol and cigarettes.SABRASABRASABRAHe is questioning whether he needs to have the MR viability scan.    Neuro/Psych neg Seizures negative neurological ROS  negative psych ROS   GI/Hepatic ,GERD  Controlled and Medicated,,(+) Cirrhosis     substance abuse (hx polysubstance abuse)  alcohol use and marijuana use  Endo/Other  negative endocrine ROS    Renal/GU negative Renal ROS  negative genitourinary   Musculoskeletal  (+) Arthritis ,    Abdominal   Peds negative pediatric ROS (+)  Hematology negative hematology ROS (+)   Anesthesia Other Findings   Reproductive/Obstetrics negative OB ROS                              Anesthesia Physical Anesthesia Plan  ASA: 4  Anesthesia Plan: General   Post-op Pain Management: Tylenol  PO (pre-op)*   Induction: Intravenous  PONV Risk Score and Plan:  2 and Ondansetron , Dexamethasone  and Treatment may vary due to age or medical condition  Airway Management Planned: Oral ETT  Additional Equipment: ClearSight  Intra-op Plan:   Post-operative Plan: Extubation in OR  Informed Consent: I have reviewed the patients History and Physical, chart, labs and discussed the procedure including the risks, benefits and alternatives for the proposed anesthesia with the patient or authorized representative who has indicated his/her understanding and acceptance.     Dental advisory given  Plan Discussed with: CRNA  Anesthesia Plan Comments: (  )         Anesthesia Quick  Evaluation  "

## 2024-06-18 ENCOUNTER — Encounter (HOSPITAL_COMMUNITY): Payer: Self-pay | Admitting: Vascular Surgery

## 2024-06-18 ENCOUNTER — Ambulatory Visit (HOSPITAL_COMMUNITY)
Admission: RE | Admit: 2024-06-18 | Discharge: 2024-06-18 | Disposition: A | Discharge status: Home / Self Care | Attending: Otolaryngology | Admitting: Otolaryngology

## 2024-06-18 ENCOUNTER — Other Ambulatory Visit: Payer: Self-pay

## 2024-06-18 ENCOUNTER — Encounter (HOSPITAL_COMMUNITY): Payer: Self-pay | Admitting: Otolaryngology

## 2024-06-18 ENCOUNTER — Ambulatory Visit (HOSPITAL_COMMUNITY): Payer: Self-pay | Admitting: Vascular Surgery

## 2024-06-18 ENCOUNTER — Other Ambulatory Visit (HOSPITAL_COMMUNITY): Payer: Self-pay

## 2024-06-18 ENCOUNTER — Encounter (HOSPITAL_COMMUNITY)
Admission: RE | Disposition: A | Discharge status: Home / Self Care | Payer: Self-pay | Source: Home / Self Care | Attending: Otolaryngology

## 2024-06-18 DIAGNOSIS — Z7982 Long term (current) use of aspirin: Secondary | ICD-10-CM | POA: Insufficient documentation

## 2024-06-18 DIAGNOSIS — I5022 Chronic systolic (congestive) heart failure: Secondary | ICD-10-CM | POA: Diagnosis not present

## 2024-06-18 DIAGNOSIS — J3489 Other specified disorders of nose and nasal sinuses: Secondary | ICD-10-CM | POA: Diagnosis present

## 2024-06-18 DIAGNOSIS — J387 Other diseases of larynx: Secondary | ICD-10-CM | POA: Diagnosis present

## 2024-06-18 DIAGNOSIS — Z87891 Personal history of nicotine dependence: Secondary | ICD-10-CM | POA: Diagnosis not present

## 2024-06-18 DIAGNOSIS — M1990 Unspecified osteoarthritis, unspecified site: Secondary | ICD-10-CM | POA: Insufficient documentation

## 2024-06-18 DIAGNOSIS — I2510 Atherosclerotic heart disease of native coronary artery without angina pectoris: Secondary | ICD-10-CM

## 2024-06-18 DIAGNOSIS — I110 Hypertensive heart disease with heart failure: Secondary | ICD-10-CM | POA: Insufficient documentation

## 2024-06-18 DIAGNOSIS — H9202 Otalgia, left ear: Secondary | ICD-10-CM | POA: Insufficient documentation

## 2024-06-18 DIAGNOSIS — K219 Gastro-esophageal reflux disease without esophagitis: Secondary | ICD-10-CM | POA: Insufficient documentation

## 2024-06-18 DIAGNOSIS — Z79899 Other long term (current) drug therapy: Secondary | ICD-10-CM | POA: Insufficient documentation

## 2024-06-18 DIAGNOSIS — C321 Malignant neoplasm of supraglottis: Secondary | ICD-10-CM | POA: Insufficient documentation

## 2024-06-18 DIAGNOSIS — K7460 Unspecified cirrhosis of liver: Secondary | ICD-10-CM | POA: Insufficient documentation

## 2024-06-18 DIAGNOSIS — I509 Heart failure, unspecified: Secondary | ICD-10-CM

## 2024-06-18 HISTORY — PX: DIRECT LARYNGOSCOPY: SHX5326

## 2024-06-18 HISTORY — DX: Atherosclerotic heart disease of native coronary artery without angina pectoris: I25.10

## 2024-06-18 HISTORY — DX: Gastro-esophageal reflux disease without esophagitis: K21.9

## 2024-06-18 LAB — CBC
HCT: 34.5 % — ABNORMAL LOW (ref 39.0–52.0)
Hemoglobin: 11 g/dL — ABNORMAL LOW (ref 13.0–17.0)
MCH: 28.1 pg (ref 26.0–34.0)
MCHC: 31.9 g/dL (ref 30.0–36.0)
MCV: 88 fL (ref 80.0–100.0)
Platelets: 560 K/uL — ABNORMAL HIGH (ref 150–400)
RBC: 3.92 MIL/uL — ABNORMAL LOW (ref 4.22–5.81)
RDW: 14.7 % (ref 11.5–15.5)
WBC: 10.7 K/uL — ABNORMAL HIGH (ref 4.0–10.5)
nRBC: 0 % (ref 0.0–0.2)

## 2024-06-18 LAB — SURGICAL PCR SCREEN
MRSA, PCR: POSITIVE — AB
Staphylococcus aureus: POSITIVE — AB

## 2024-06-18 MED ORDER — EPINEPHRINE 0.1 % (1MG/ML) IJ FOR NASAL USE
INTRAMUSCULAR | Status: DC | PRN
  Administered 2024-06-18: 1 [drp] via TOPICAL

## 2024-06-18 MED ORDER — ONDANSETRON HCL 4 MG/2ML IJ SOLN
INTRAMUSCULAR | Status: AC
  Filled 2024-06-18: qty 2

## 2024-06-18 MED ORDER — PHENYLEPHRINE 80 MCG/ML (10ML) SYRINGE FOR IV PUSH (FOR BLOOD PRESSURE SUPPORT)
PREFILLED_SYRINGE | INTRAVENOUS | Status: AC
  Filled 2024-06-18: qty 10

## 2024-06-18 MED ORDER — FENTANYL CITRATE (PF) 100 MCG/2ML IJ SOLN
INTRAMUSCULAR | Status: AC
  Filled 2024-06-18: qty 2

## 2024-06-18 MED ORDER — EPINEPHRINE HCL (NASAL) 0.1 % NA SOLN
NASAL | Status: AC
  Filled 2024-06-18: qty 30

## 2024-06-18 MED ORDER — MIDAZOLAM HCL 2 MG/2ML IJ SOLN
INTRAMUSCULAR | Status: AC
  Filled 2024-06-18: qty 2

## 2024-06-18 MED ORDER — CHLORHEXIDINE GLUCONATE 0.12 % MT SOLN
15.0000 mL | Freq: Once | OROMUCOSAL | Status: AC
  Administered 2024-06-18: 15 mL via OROMUCOSAL
  Filled 2024-06-18: qty 15

## 2024-06-18 MED ORDER — SUCCINYLCHOLINE CHLORIDE 200 MG/10ML IV SOSY
PREFILLED_SYRINGE | INTRAVENOUS | Status: AC
  Filled 2024-06-18: qty 10

## 2024-06-18 MED ORDER — PROPOFOL 10 MG/ML IV BOLUS
INTRAVENOUS | Status: AC
  Filled 2024-06-18: qty 20

## 2024-06-18 MED ORDER — HYDROCODONE-ACETAMINOPHEN 5-325 MG PO TABS
1.0000 | ORAL_TABLET | Freq: Four times a day (QID) | ORAL | 0 refills | Status: DC | PRN
  Filled 2024-06-18: qty 16, 2d supply, fill #0

## 2024-06-18 MED ORDER — LIDOCAINE 2% (20 MG/ML) 5 ML SYRINGE
INTRAMUSCULAR | Status: DC | PRN
  Administered 2024-06-18: 60 mg via INTRAVENOUS

## 2024-06-18 MED ORDER — DEXAMETHASONE SOD PHOSPHATE PF 10 MG/ML IJ SOLN
INTRAMUSCULAR | Status: DC | PRN
  Administered 2024-06-18: 10 mg via INTRAVENOUS

## 2024-06-18 MED ORDER — DEXAMETHASONE SOD PHOSPHATE PF 10 MG/ML IJ SOLN
INTRAMUSCULAR | Status: AC
  Filled 2024-06-18: qty 1

## 2024-06-18 MED ORDER — EPHEDRINE 5 MG/ML INJ
INTRAVENOUS | Status: AC
  Filled 2024-06-18: qty 5

## 2024-06-18 MED ORDER — 0.9 % SODIUM CHLORIDE (POUR BTL) OPTIME
TOPICAL | Status: DC | PRN
  Administered 2024-06-18: 1000 mL

## 2024-06-18 MED ORDER — SUCCINYLCHOLINE CHLORIDE 200 MG/10ML IV SOSY
PREFILLED_SYRINGE | INTRAVENOUS | Status: DC | PRN
  Administered 2024-06-18: 100 mg via INTRAVENOUS

## 2024-06-18 MED ORDER — ONDANSETRON HCL 4 MG/2ML IJ SOLN
INTRAMUSCULAR | Status: DC | PRN
  Administered 2024-06-18: 4 mg via INTRAVENOUS

## 2024-06-18 MED ORDER — ORAL CARE MOUTH RINSE: 15.0000 mL | Freq: Once | OROMUCOSAL | Status: AC

## 2024-06-18 MED ORDER — LIDOCAINE 2% (20 MG/ML) 5 ML SYRINGE
INTRAMUSCULAR | Status: AC
  Filled 2024-06-18: qty 5

## 2024-06-18 MED ORDER — PHENYLEPHRINE 80 MCG/ML (10ML) SYRINGE FOR IV PUSH (FOR BLOOD PRESSURE SUPPORT)
PREFILLED_SYRINGE | INTRAVENOUS | Status: DC | PRN
  Administered 2024-06-18: 160 ug via INTRAVENOUS
  Administered 2024-06-18: 80 ug via INTRAVENOUS
  Administered 2024-06-18: 240 ug via INTRAVENOUS

## 2024-06-18 MED ORDER — MIDAZOLAM HCL (PF) 2 MG/2ML IJ SOLN
INTRAMUSCULAR | Status: DC | PRN
  Administered 2024-06-18: 2 mg via INTRAVENOUS

## 2024-06-18 MED ORDER — EPHEDRINE SULFATE-NACL 50-0.9 MG/10ML-% IV SOSY
PREFILLED_SYRINGE | INTRAVENOUS | Status: DC | PRN
  Administered 2024-06-18: 5 mg via INTRAVENOUS

## 2024-06-18 MED ORDER — PROPOFOL 10 MG/ML IV BOLUS
INTRAVENOUS | Status: DC | PRN
  Administered 2024-06-18: 100 mg via INTRAVENOUS

## 2024-06-18 MED ORDER — LACTATED RINGERS IV SOLN: INTRAVENOUS | Status: DC

## 2024-06-18 NOTE — Transfer of Care (Signed)
 Immediate Anesthesia Transfer of Care Note  Patient: Kyle Peck  Procedure(s) Performed: LARYNGOSCOPY, DIRECT  Patient Location: PACU  Anesthesia Type:General  Level of Consciousness: awake, alert , and oriented  Airway & Oxygen Therapy: Patient Spontanous Breathing and Patient connected to face mask oxygen  Post-op Assessment: Report given to RN and Post -op Vital signs reviewed and stable  Post vital signs: stable  Last Vitals:  Vitals Value Taken Time  BP 105/58 06/18/24 11:26  Temp    Pulse 80 06/18/24 11:27  Resp 11 06/18/24 11:27  SpO2 98 % 06/18/24 11:27  Vitals shown include unfiled device data.  Last Pain:  Vitals:   06/18/24 0901  TempSrc:   PainSc: 0-No pain         Complications: No notable events documented.

## 2024-06-18 NOTE — Interval H&P Note (Signed)
 History and Physical Interval Note:  06/18/2024 10:05 AM  Kyle Peck  has presented today for surgery, with the diagnosis of Former smoker Nasal septal perforation Laryngeal mass.  The various methods of treatment have been discussed with the patient and family. After consideration of risks, benefits and other options for treatment, the patient has consented to  Procedures: LARYNGOSCOPY, DIRECT (N/A) as a surgical intervention.  The patient's history has been reviewed, patient examined, no change in status, stable for surgery.  I have reviewed the patient's chart and labs.  Questions were answered to the patient's satisfaction.     Ida Loader

## 2024-06-18 NOTE — Progress Notes (Signed)
 Pt had a 1 mint halls cough drop around 7 am today. Also took sacubitril -valsartan  (ENTRESTO ) this am. Dr. Erma is aware.

## 2024-06-18 NOTE — Op Note (Signed)
 OPERATIVE REPORT  DATE OF SURGERY: 06/18/2024  PATIENT:  Kyle Peck,  64 y.o. male  PRE-OPERATIVE DIAGNOSIS:  Former smoker Nasal septal perforation Laryngeal mass  POST-OPERATIVE DIAGNOSIS:  Former smoker Nasal septal perforation Laryngeal mass  PROCEDURE:  Procedures: LARYNGOSCOPY, DIRECT with biopsy of supraglottic mass Esophagoscopy  SURGEON:  Ida VEAR Loader, MD  ASSISTANTS: none  ANESTHESIA:   General   EBL: 30 ml  DRAINS: none  LOCAL MEDICATIONS USED:  None  SPECIMEN: Supraglottic mass  COUNTS:  Correct  PROCEDURE DETAILS: The patient was taken to the operating room and placed on the operating table in the supine position. Following induction of general endotracheal anesthesia, the table was turned 90 and the patient was draped in a standard fashion.  A maxillary tooth protector was used throughout the procedure.  A Jako laryngoscope was used to evaluate the hypopharynx and larynx.  Large ulcerative mass destroying the entire supraglottic larynx was identified.  Multiple biopsies were taken and sent for pathologic evaluation.  The tongue base did not appear to be involved.  The glottic larynx also did not appear to be involved.  The cords were clean.  The subglottis was clear.  Piriform sinuses appeared to be clear as well.  The tumor did not extend all the way to the arytenoids on either side but the AE folds were involved.  The tumor is bilateral.  Topical adrenaline was used for hemostasis.  Rigid esophagoscopy.  The cervical rigid esophagoscope was passed into the esophagus and passed until the hub.  It was slowly withdrawn while suctioning blood and secretions.  There were no mucosal lesions identified in the esophagus.  Patient was awakened extubated and transferred to recovery in stable condition.    PATIENT DISPOSITION:  To PACU, stable

## 2024-06-18 NOTE — Anesthesia Procedure Notes (Signed)
 Procedure Name: Intubation Date/Time: 06/18/2024 11:01 AM  Performed by: Elly Pfeiffer, CRNAPre-anesthesia Checklist: Patient identified, Emergency Drugs available, Suction available and Patient being monitored Patient Re-evaluated:Patient Re-evaluated prior to induction Oxygen Delivery Method: Circle system utilized Preoxygenation: Pre-oxygenation with 100% oxygen Induction Type: IV induction Ventilation: Mask ventilation without difficulty Laryngoscope Size: Glidescope and 3 Grade View: Grade II Tube type: Oral Tube size: 6.0 mm Number of attempts: 1 Airway Equipment and Method: Stylet and Oral airway Placement Confirmation: ETT inserted through vocal cords under direct vision, positive ETCO2 and breath sounds checked- equal and bilateral Secured at: 22 cm Tube secured with: Tape Dental Injury: Teeth and Oropharynx as per pre-operative assessment  Comments: Glottic opening clear; no trauma. CA

## 2024-06-18 NOTE — Anesthesia Postprocedure Evaluation (Signed)
"   Anesthesia Post Note  Patient: Kyle Peck  Procedure(s) Performed: LARYNGOSCOPY, DIRECT     Patient location during evaluation: PACU Anesthesia Type: General Level of consciousness: awake and alert, oriented and patient cooperative Pain management: pain level controlled Vital Signs Assessment: post-procedure vital signs reviewed and stable Respiratory status: spontaneous breathing, nonlabored ventilation and respiratory function stable Cardiovascular status: blood pressure returned to baseline and stable Postop Assessment: no apparent nausea or vomiting Anesthetic complications: no   No notable events documented.  Last Vitals:  Vitals:   06/18/24 1130 06/18/24 1145  BP: 92/67 102/85  Pulse: 81 76  Resp: 12 14  Temp:    SpO2: 96% 95%    Last Pain:  Vitals:   06/18/24 1125  TempSrc:   PainSc: 0-No pain                 [name]      "

## 2024-06-19 ENCOUNTER — Encounter (HOSPITAL_COMMUNITY): Payer: Self-pay | Admitting: Otolaryngology

## 2024-06-19 LAB — SURGICAL PATHOLOGY

## 2024-06-24 ENCOUNTER — Other Ambulatory Visit (HOSPITAL_COMMUNITY): Payer: Self-pay

## 2024-06-24 ENCOUNTER — Other Ambulatory Visit: Payer: Self-pay

## 2024-06-24 ENCOUNTER — Other Ambulatory Visit (HOSPITAL_BASED_OUTPATIENT_CLINIC_OR_DEPARTMENT_OTHER): Payer: Self-pay

## 2024-06-24 MED ORDER — HYDROCODONE-ACETAMINOPHEN 5-325 MG PO TABS
2.0000 | ORAL_TABLET | Freq: Four times a day (QID) | ORAL | 0 refills | Status: DC | PRN
  Filled 2024-06-24: qty 12, 2d supply, fill #0

## 2024-06-24 MED ORDER — NYSTATIN 100000 UNIT/ML MT SUSP
OROMUCOSAL | 5 refills | Status: AC
  Filled 2024-06-24: qty 240, 12d supply, fill #0
  Filled 2024-07-02: qty 240, 12d supply, fill #1
  Filled 2024-07-11 (×2): qty 240, 12d supply, fill #2

## 2024-06-26 ENCOUNTER — Other Ambulatory Visit (HOSPITAL_BASED_OUTPATIENT_CLINIC_OR_DEPARTMENT_OTHER): Payer: Self-pay

## 2024-06-26 ENCOUNTER — Other Ambulatory Visit: Payer: Self-pay

## 2024-06-26 MED ORDER — HYDROCODONE-ACETAMINOPHEN 5-325 MG PO TABS
2.0000 | ORAL_TABLET | Freq: Four times a day (QID) | ORAL | 0 refills | Status: DC | PRN
  Filled 2024-06-26: qty 40, 5d supply, fill #0

## 2024-07-01 ENCOUNTER — Other Ambulatory Visit (HOSPITAL_COMMUNITY): Payer: Self-pay | Admitting: Otolaryngology

## 2024-07-01 DIAGNOSIS — C329 Malignant neoplasm of larynx, unspecified: Secondary | ICD-10-CM

## 2024-07-02 ENCOUNTER — Other Ambulatory Visit (HOSPITAL_BASED_OUTPATIENT_CLINIC_OR_DEPARTMENT_OTHER): Payer: Self-pay

## 2024-07-03 ENCOUNTER — Other Ambulatory Visit (HOSPITAL_BASED_OUTPATIENT_CLINIC_OR_DEPARTMENT_OTHER): Payer: Self-pay

## 2024-07-03 MED ORDER — HYDROCODONE-ACETAMINOPHEN 5-325 MG PO TABS
2.0000 | ORAL_TABLET | Freq: Four times a day (QID) | ORAL | 0 refills | Status: DC | PRN
  Filled 2024-07-03: qty 40, 5d supply, fill #0

## 2024-07-03 NOTE — Progress Notes (Incomplete)
 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 []  []    Swallowing concerns? []  []    PEG? []  []     Referrals Yes No Comments  Social Work? []  []    Dentistry? []  []    Swallowing therapy? []  []    Nutrition? []  []    Med/Onc? []  []     Safety Issues Yes No Comments  Prior radiation? []  []    Pacemaker/ICD? []  []    Possible current pregnancy? []  []    Is the patient on methotrexate? []  []     Tobacco/Marijuana/Snuff/ETOH use: Former smoker  Past/Anticipated interventions by otolaryngology, if any:  Jesus, MD 06/18/2024 Laryngoscopy   Current Complaints / other details:

## 2024-07-09 ENCOUNTER — Other Ambulatory Visit (HOSPITAL_BASED_OUTPATIENT_CLINIC_OR_DEPARTMENT_OTHER): Payer: Self-pay

## 2024-07-09 ENCOUNTER — Other Ambulatory Visit (HOSPITAL_COMMUNITY): Payer: Self-pay

## 2024-07-09 ENCOUNTER — Encounter: Payer: Self-pay | Admitting: Radiation Oncology

## 2024-07-09 DIAGNOSIS — C321 Malignant neoplasm of supraglottis: Secondary | ICD-10-CM | POA: Insufficient documentation

## 2024-07-09 MED ORDER — HYDROCODONE-ACETAMINOPHEN 5-325 MG PO TABS
2.0000 | ORAL_TABLET | Freq: Four times a day (QID) | ORAL | 0 refills | Status: DC | PRN
  Filled 2024-07-09: qty 12, 2d supply, fill #0

## 2024-07-10 ENCOUNTER — Other Ambulatory Visit (HOSPITAL_BASED_OUTPATIENT_CLINIC_OR_DEPARTMENT_OTHER): Payer: Self-pay

## 2024-07-10 ENCOUNTER — Encounter (HOSPITAL_COMMUNITY): Admission: RE | Admit: 2024-07-10

## 2024-07-10 DIAGNOSIS — C329 Malignant neoplasm of larynx, unspecified: Secondary | ICD-10-CM

## 2024-07-10 MED ORDER — HYDROCODONE-ACETAMINOPHEN 5-325 MG PO TABS
2.0000 | ORAL_TABLET | Freq: Four times a day (QID) | ORAL | 0 refills | Status: DC | PRN
  Filled 2024-07-10: qty 50, 7d supply, fill #0

## 2024-07-10 MED ORDER — FLUDEOXYGLUCOSE F - 18 (FDG) INJECTION
8.5900 | Freq: Once | INTRAVENOUS | Status: AC | PRN
  Administered 2024-07-10: 8.59 via INTRAVENOUS

## 2024-07-10 NOTE — Telephone Encounter (Signed)
 Wife notified that per Dr. Jesus For some reason I am not able to prescribe that many. I will try again. Wife understood.

## 2024-07-10 NOTE — Progress Notes (Signed)
 " Radiation Oncology         [phone] ________________________________  Initial Outpatient Consultation  Name: Kyle Peck MRN: [id number]  Date: 07/11/2024  DOB: 1961-05-08  CC:Pcp, No  Jesus Oliphant, MD   REFERRING PHYSICIAN: Jesus Oliphant, MD  DIAGNOSIS: No diagnosis found.   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 male who presented to Dr. Jesus on 04/15/24 for evaluation of several problems including intermittent left ear pain, a history of tonsil stones, and a several year history of crusty buildup in his nose. Physical exam performed at that time noted a supraglottic mass involving the epiglottis. Examination of the nose also revealed a very large septal perforation with crusting around the edges (likely due to prior cocaine use as noted below). No other physical abnormalities were appreciated.   To further evaluated the supraglottic mass, a soft tissue neck CT was performed on 04/18/24 that revealed: a large suspicious enhancing supraglottic mass measuring approximately 2.9 x 4.2 x 4.2 cm (mildly right-eccentric), extending from aryepiglottic folds to the false cords, and abutting but not invading the thyroid cartilage, and bilateral level 2 cervical lymphadenopathy measuring up to 1.8 cm, suspicious for metastatic disease. CT findings also included: a few faint ground-glass opacities in the right lung apex, a perforated nasal septum with eroded nasal turbinates, and paranasal sinus disease involving the floors of the frontal sinuses and anterior ethmoid air cells.   Dr. Jesus advised proceeding with a laryngoscopy w/ biopsies of the mass under anesthesia. This  was delayed slightly due to a delay in obtaining cardiac clearance (history of CHF& CAD). .   Cardiac clearance was eventually obtained and he was able to proceed with biopsies of the supraglottic mass under anesthesia on 06/18/24. Pathology showed findings consistent with moderately differentiated invasive squamous cell carcinoma.   Pertinent imaging performed thus far includes a PET scan which was performed yesterday. Results are pending at this time. ***  Swallowing issues, if any: none  Weight Changes: ***  Pain status: ***  Other symptoms: left ear pain, crusting and discomfort from perforated nasal septum,   Tobacco history, if any: former smoker   ETOH abuse, if any: history of alcohol abuse (consisting of around 15.0 standard drinks per week); no longer drinks   Substance abuse history, if any: history of cocaine use approximately 6-7 years ago  Prior cancers, if any: ***  Other: history of facial fracture many years ago from playing football; required reconstruction of the left maxilla  PREVIOUS RADIATION THERAPY: No  PAST MEDICAL HISTORY:  has a past medical history of Acid reflux, Alcohol use, Arthritis, CHF (congestive heart failure) (HCC), Coronary artery disease, Hypertension, Marijuana use, and Tobacco abuse.    PAST SURGICAL HISTORY: Past Surgical History:  Procedure Laterality Date   DIRECT LARYNGOSCOPY N/A 06/18/2024   Procedure: LARYNGOSCOPY, DIRECT;  Surgeon: Jesus Oliphant, MD;  Location: [facility] OR;  Service: ENT;  Laterality: N/A;   FRACTURE SURGERY     cheek    RIGHT/LEFT HEART CATH AND CORONARY ANGIOGRAPHY N/A 11/04/2020   Procedure: RIGHT/LEFT HEART CATH AND CORONARY ANGIOGRAPHY;  Surgeon: Rolan Ezra RAMAN, MD;  Location: [facility] INVASIVE CV LAB;  Service: Cardiovascular;  Laterality: N/A;    FAMILY HISTORY:  family history includes Congestive Heart Failure in his father and mother.  SOCIAL HISTORY:  reports that he has quit smoking. His smoking use included cigars  and cigarettes. He has never used smokeless tobacco. He reports that he does not currently use alcohol after a past usage of about 15.0 standard drinks of alcohol per week. He reports that he does not currently use drugs after having used the following drugs: Marijuana. Frequency: 2.00 times per week.  ALLERGIES: Spironolactone   MEDICATIONS:  Current Outpatient Medications  Medication Sig Dispense Refill   aspirin  81 MG chewable tablet Chew 1 tablet (81 mg total) by mouth daily. 30 tablet 11   atorvastatin  (LIPITOR) 40 MG tablet Take 1 tablet (40 mg total) by mouth daily. 90 tablet 3   cholecalciferol (VITAMIN D3) 25 MCG (1000 UNIT) tablet Take 1,000 Units by mouth daily.     cyanocobalamin (VITAMIN B12) 1000 MCG tablet Take 1,000 mcg by mouth daily.     digoxin  (LANOXIN ) 0.125 MG tablet Take 0.5 tablets (0.0625 mg total) by mouth daily. 45 tablet 3   empagliflozin  (JARDIANCE ) 10 MG TABS tablet Take 1 tablet (10 mg total) by mouth daily before breakfast. 90 tablet 3   eplerenone  (INSPRA ) 50 MG tablet Take 1 tablet (50 mg total) by mouth daily. 90 tablet 3   furosemide  (LASIX ) 20 MG tablet Take 1 tablet (20 mg total) by mouth daily as needed. 90 tablet 3   HYDROcodone -acetaminophen  (NORCO/VICODIN) 5-325 MG tablet Take 2 tablets by mouth every 6 (six) hours as needed for severe pain (7-10). 12 tablet 0   metoprolol  succinate (TOPROL  XL) 25 MG 24 hr tablet Take 0.5 tablets (12.5 mg total) by mouth at bedtime.     Multiple Vitamins-Minerals (MULTIVITAMIN WITH MINERALS) tablet Take 1 tablet by mouth daily.     nystatin -diphenhydrAMINE -dexamethasone  Take 5ml by mouth 4 times a day. 240 mL 5   omeprazole  (PRILOSEC) 20 MG capsule Take 1 capsule (20 mg total) by mouth daily. 90 capsule 0   sacubitril -valsartan  (ENTRESTO ) 97-103 MG Take 1 tablet by mouth 2 (two) times daily. 180 tablet 3   traMADol  (ULTRAM ) 50 MG tablet Take 2 tablets (100 mg total) by mouth every 6 (six) hours as needed. 60 tablet 1    traZODone  (DESYREL ) 100 MG tablet Take 1 tablet (100 mg total) by mouth at bedtime as needed for sleep. (Patient taking differently: Take 100 mg by mouth at bedtime.) 30 tablet 3   No current facility-administered medications for this encounter.    REVIEW OF SYSTEMS:  Notable for that above.   PHYSICAL EXAM:  vitals were not taken for this visit.   General: Alert and oriented, in no acute distress HEENT: Head is normocephalic. Extraocular movements are intact. Oropharynx is notable for ***. Neck: Neck is notable for *** Heart: Regular in rate and rhythm with no murmurs, rubs, or gallops. Chest: Clear to auscultation bilaterally, with no rhonchi, wheezes, or rales. Abdomen: Soft, nontender, nondistended, with no rigidity or guarding. Extremities: No cyanosis or edema. Lymphatics: see Neck Exam Skin: No concerning lesions. Musculoskeletal: symmetric strength and muscle tone throughout. Neurologic: Cranial nerves II through XII are grossly intact. No obvious focalities. Speech is fluent. Coordination is intact. Psychiatric: Judgment and insight are intact. Affect is appropriate.   ECOG = ***  0 - Asymptomatic (Fully active, able to carry on all predisease activities without restriction)  1 - Symptomatic but completely ambulatory (Restricted in physically strenuous activity but ambulatory and able to carry out work of  a light or sedentary nature. For example, light housework, office work)  2 - Symptomatic, <50% in bed during the day (Ambulatory and capable of all self care but unable to carry out any work activities. Up and about more than 50% of waking hours)  3 - Symptomatic, >50% in bed, but not bedbound (Capable of only limited self-care, confined to bed or chair 50% or more of waking hours)  4 - Bedbound (Completely disabled. Cannot carry on any self-care. Totally confined to bed or chair)  5 - Death   Raylene MM, Creech RH, Tormey DC, et al. [phone]). Toxicity and response criteria  of the [facility]. Am. DOROTHA Bridges. Oncol. 5 (6): 649-55   LABORATORY DATA:  Lab Results  Component Value Date   WBC 10.7 (H) 06/18/2024   HGB 11.0 (L) 06/18/2024   HCT 34.5 (L) 06/18/2024   MCV 88.0 06/18/2024   PLT 560 (H) 06/18/2024   CMP     Component Value Date/Time   NA 136 06/02/2024 1504   K 3.9 06/02/2024 1504   CL 96 (L) 06/02/2024 1504   CO2 29 06/02/2024 1504   GLUCOSE 205 (H) 06/02/2024 1504   BUN 31 (H) 06/02/2024 1504   CREATININE 1.18 06/02/2024 1504   CALCIUM  9.5 06/02/2024 1504   PROT 7.4 03/06/2024 1550   ALBUMIN 3.8 03/06/2024 1550   AST 18 03/06/2024 1550   ALT 14 03/06/2024 1550   ALKPHOS 70 03/06/2024 1550   BILITOT 1.0 03/06/2024 1550   GFRNONAA >60 06/02/2024 1504      Lab Results  Component Value Date   TSH 3.217 10/23/2020     RADIOGRAPHY: No results found.    IMPRESSION/PLAN:  This is a delightful patient with head and neck cancer. I *** recommend radiotherapy for this patient.  We discussed the potential risks, benefits, and side effects of radiotherapy. We talked in detail about acute and late effects. We discussed that some of the most bothersome acute effects may be mucositis, dysgeusia, salivary changes, skin irritation, hair loss, dehydration, weight loss and fatigue. We talked about late effects which include but are not necessarily limited to dysphagia, hypothyroidism, nerve injury, vascular injury, spinal cord injury, xerostomia, trismus, neck edema, dental issues, non-healing wound, and potentially fatal injury to any of the tissues in the head and neck region. No guarantees of treatment were given. A consent form was signed and placed in the patient's medical record. The patient is enthusiastic about proceeding with treatment. I look forward to participating in the patient's care.    Simulation (treatment planning) will take place ***  We also discussed that the treatment of head and neck cancer is a  multidisciplinary process to maximize treatment outcomes and quality of life. For this reason the following referrals have been or will be made:  *** Medical oncology to discuss chemotherapy   *** Dentistry for dental evaluation, possible extractions in the radiation fields, and /or advice on reducing risk of cavities, osteoradionecrosis, or other oral issues.  *** Nutritionist for nutrition support during and after treatment.  *** Speech language pathology for swallowing and/or speech therapy.  *** Social work for social support.   *** Physical therapy due to risk of lymphedema in neck and deconditioning.  *** Baseline labs including TSH.  On date of service, in total, I spent *** minutes on this encounter. Patient was seen in person.  __________________________________________   Lauraine Golden, MD  This document serves as a record of services personally performed  by Lauraine Golden, MD. It was created on her behalf by Dorthy Fuse, a trained medical scribe. The creation of this record is based on the scribe's personal observations and the provider's statements to them. This document has been checked and approved by the attending provider.  "

## 2024-07-11 ENCOUNTER — Inpatient Hospital Stay

## 2024-07-11 ENCOUNTER — Inpatient Hospital Stay: Admitting: Oncology

## 2024-07-11 ENCOUNTER — Other Ambulatory Visit: Payer: Self-pay | Admitting: Oncology

## 2024-07-11 ENCOUNTER — Ambulatory Visit: Admission: RE | Admit: 2024-07-11

## 2024-07-11 ENCOUNTER — Encounter: Payer: Self-pay | Admitting: Radiation Oncology

## 2024-07-11 ENCOUNTER — Ambulatory Visit: Admission: RE | Admit: 2024-07-11 | Admitting: Radiation Oncology

## 2024-07-11 ENCOUNTER — Other Ambulatory Visit (HOSPITAL_COMMUNITY): Payer: Self-pay

## 2024-07-11 ENCOUNTER — Other Ambulatory Visit: Payer: Self-pay

## 2024-07-11 DIAGNOSIS — C321 Malignant neoplasm of supraglottis: Secondary | ICD-10-CM

## 2024-07-11 DIAGNOSIS — D649 Anemia, unspecified: Secondary | ICD-10-CM

## 2024-07-11 MED ORDER — HYDROCODONE-ACETAMINOPHEN 7.5-325 MG PO TABS
1.0000 | ORAL_TABLET | Freq: Three times a day (TID) | ORAL | 0 refills | Status: AC | PRN
  Filled 2024-07-11 (×2): qty 60, 20d supply, fill #0

## 2024-07-11 NOTE — Progress Notes (Unsigned)
 "  [facility]  ONCOLOGY CONSULT NOTE   PATIENT NAME: Kyle Peck   MR#: [id number] DOB: 05-Nov-1960  DATE OF SERVICE: 07/11/2024   REFERRING PROVIDER  ***  Patient Care Team: Pcp, No as PCP - General Branch, Dorn FALCON, MD as PCP - Cardiology (Cardiology) Kennyth Chew, MD as PCP - Electrophysiology (Cardiology)    CHIEF COMPLAINT/ PURPOSE OF CONSULTATION:   ***  ASSESSMENT & PLAN:   Donshay Astorino is a 64 y.o. gentleman with a past medical history of polysubstance abuse including tobacco, THC, cocaine in the past, congestive heart failure with LVEF as low as 15% in the past, later improved to 40%, CAD, was referred to our clinic for ***  No problem-specific Assessment & Plan notes found for this encounter.   Assessment and Plan Assessment & Plan       I reviewed lab results and outside records for this visit and discussed relevant results with the patient. Diagnosis, plan of care and treatment options were also discussed in detail with the patient. Opportunity provided to ask questions and answers provided to his apparent satisfaction. Provided instructions to call our clinic with any problems, questions or concerns prior to return visit. I recommended to continue follow-up with PCP and sub-specialists. He verbalized understanding and agreed with the plan. No barriers to learning was detected.  NCCN guidelines have been consulted in the planning of this patients care.  [name], MD  07/11/2024 1:54 PM  [facility] - A DEPT OF [facility] [address] [place] [address] Dept: [phone] Dept Fax: [phone]   HISTORY OF PRESENTING ILLNESS:   I have reviewed his chart and materials related to his cancer extensively and collaborated history with the patient. Summary of oncologic history is as follows:  ONCOLOGY HISTORY:  Patient had initial consultation with Dr. Jesus on  04/15/2024 after he was referred for nasal septal perforation and presumed tonsillar enlargement.  Patient had a complicated history of facial fracture years ago after playing football.  He had surgical reconstruction of the left maxilla.  He has had problems with crusty buildup in his nose for several years.  He used to also do cocaine previously, approximately until 2017.  He also used to smoke and drink alcohol previously but not anymore.  He had cardiac issues including CAD, CHF.  On Dr. Godfrey exam, patient was noted to have very large septal perforation in the nose, with crusting around the edges.  Tonsils were symmetric, not enlarged.  Indirect laryngoscopy revealed a supraglottic mass involving the epiglottis.  Cords seemed to have been patent and were moving well.  This was concerning for carcinoma.  Dr. Jesus recommended CT imaging and planned to proceed with direct laryngoscopy and biopsies.  On 04/18/2024, CT of the neck showed large enhancing supraglottic mass (approx. 2.9 x 4.2 x 4.2 cm), mildly right-eccentric, extending from aryepiglottic folds to false cords, abutting but not invading the thyroid cartilage; findings compatible with laryngeal malignancy. Bilateral level 2 cervical lymphadenopathy (nodes up to 18 mm), suspicious for metastatic disease. Few faint ground-glass opacities in the right lung apex; recommend correlation with dedicated chest CT if clinically indicated. Perforated nasal septum (~4 cm) with eroded nasal turbinates. Paranasal sinus disease involving floors of the frontal sinuses and anterior ethmoid air cells.  CT of the chest was planned for further evaluation of lung findings.  Direct laryngoscopy and biopsy was planned, pending cardiology clearance.  Oncology History   No problem history exists.    INTERVAL HISTORY:  Discussed the use of AI scribe software for clinical note transcription with the patient, who gave verbal consent to proceed.  History of  Present Illness     ***  MEDICAL HISTORY:  Past Medical History:  Diagnosis Date   Acid reflux    Alcohol use    Arthritis    CHF (congestive heart failure) (HCC)    Coronary artery disease    Hypertension    Marijuana use    Tobacco abuse     SURGICAL HISTORY: Past Surgical History:  Procedure Laterality Date   DIRECT LARYNGOSCOPY N/A 06/18/2024   Procedure: LARYNGOSCOPY, DIRECT;  Surgeon: Jesus Oliphant, MD;  Location: [facility] OR;  Service: ENT;  Laterality: N/A;   FRACTURE SURGERY     cheek    RIGHT/LEFT HEART CATH AND CORONARY ANGIOGRAPHY N/A 11/04/2020   Procedure: RIGHT/LEFT HEART CATH AND CORONARY ANGIOGRAPHY;  Surgeon: Rolan Ezra RAMAN, MD;  Location: [facility] INVASIVE [facility] LAB;  Service: Cardiovascular;  Laterality: N/A;    SOCIAL HISTORY: He reports that he has quit smoking. His smoking use included cigars and cigarettes. He has never used smokeless tobacco. He reports that he does not currently use alcohol after a past usage of about 15.0 standard drinks of alcohol per week. He reports that he does not currently use drugs after having used the following drugs: Marijuana. Frequency: 2.00 times per week. Social History   Socioeconomic History   Marital status: Married    Spouse name: Elveria   Number of children: Not on file   Years of education: Not on file   Highest education level: Not on file  Occupational History   Not on file  Tobacco Use   Smoking status: Former    Current packs/day: 0.50    Types: Cigars, Cigarettes   Smokeless tobacco: Never  Vaping Use   Vaping status: Never Used  Substance and Sexual Activity   Alcohol use: Not Currently    Alcohol/week: 15.0 standard drinks of alcohol    Types: 15 Shots of liquor per week    Comment: intermittently   Drug use: Not Currently    Frequency: 2.0 times per week    Types: Marijuana    Comment: also around cocaine before   Sexual activity: Not on file  Other Topics Concern   Not on file  Social History  Narrative   Not on file   Social Drivers of Health   Tobacco Use: Medium Risk (06/18/2024)   Patient History    Smoking Tobacco Use: Former    Smokeless Tobacco Use: Never    Passive Exposure: Not on file  Financial Resource Strain: High Risk (03/13/2022)   Overall Financial Resource Strain (CARDIA)    Difficulty of Paying Living Expenses: Very hard  Food Insecurity: Food Insecurity Present (03/13/2022)   Hunger Vital Sign    Worried About Running Out of Food in the Last Year: Sometimes true    Ran Out of Food in the Last Year: Sometimes true  Transportation Needs: No Transportation Needs (03/12/2024)   Epic    Lack of Transportation (Medical): No    Lack of Transportation (Non-Medical): No  Physical Activity: Not on file  Stress: Not on file  Social Connections: Not on file  Intimate Partner Violence: Not on file  Depression (EYV7-0): Not on file  Alcohol Screen: Not on file  Housing: Low Risk (03/13/2022)   Housing    Last Housing Risk  Score: 0  Utilities: Not on file  Health Literacy: Not on file    FAMILY HISTORY: Family History  Problem Relation Age of Onset   Congestive Heart Failure Mother    Congestive Heart Failure Father     ALLERGIES:  He is allergic to spironolactone .  MEDICATIONS:  Current Outpatient Medications  Medication Sig Dispense Refill   aspirin  81 MG chewable tablet Chew 1 tablet (81 mg total) by mouth daily. 30 tablet 11   atorvastatin  (LIPITOR) 40 MG tablet Take 1 tablet (40 mg total) by mouth daily. 90 tablet 3   cholecalciferol (VITAMIN D3) 25 MCG (1000 UNIT) tablet Take 1,000 Units by mouth daily.     cyanocobalamin (VITAMIN B12) 1000 MCG tablet Take 1,000 mcg by mouth daily.     digoxin  (LANOXIN ) 0.125 MG tablet Take 0.5 tablets (0.0625 mg total) by mouth daily. 45 tablet 3   empagliflozin  (JARDIANCE ) 10 MG TABS tablet Take 1 tablet (10 mg total) by mouth daily before breakfast. 90 tablet 3   eplerenone  (INSPRA ) 50 MG tablet Take 1 tablet (50  mg total) by mouth daily. 90 tablet 3   furosemide  (LASIX ) 20 MG tablet Take 1 tablet (20 mg total) by mouth daily as needed. 90 tablet 3   HYDROcodone -acetaminophen  (NORCO/VICODIN) 5-325 MG tablet Take 2 tablets by mouth every 6 (six) hours as needed for severe pain (7-10). 50 tablet 0   metoprolol  succinate (TOPROL  XL) 25 MG 24 hr tablet Take 0.5 tablets (12.5 mg total) by mouth at bedtime.     Multiple Vitamins-Minerals (MULTIVITAMIN WITH MINERALS) tablet Take 1 tablet by mouth daily.     nystatin -diphenhydrAMINE -dexamethasone  Take 5ml by mouth 4 times a day. 240 mL 5   omeprazole  (PRILOSEC) 20 MG capsule Take 1 capsule (20 mg total) by mouth daily. 90 capsule 0   sacubitril -valsartan  (ENTRESTO ) 97-103 MG Take 1 tablet by mouth 2 (two) times daily. 180 tablet 3   traMADol  (ULTRAM ) 50 MG tablet Take 2 tablets (100 mg total) by mouth every 6 (six) hours as needed. 60 tablet 1   traZODone  (DESYREL ) 100 MG tablet Take 1 tablet (100 mg total) by mouth at bedtime as needed for sleep. (Patient taking differently: Take 100 mg by mouth at bedtime.) 30 tablet 3   No current facility-administered medications for this visit.    REVIEW OF SYSTEMS:    Review of Systems - Oncology  All other pertinent systems were reviewed with the patient and are negative.  PHYSICAL EXAMINATION:  ***   There were no vitals filed for this visit. There were no vitals filed for this visit.  Physical Exam  ***  LABORATORY DATA:   I have reviewed the data as listed.  No results found for any visits on 07/11/24.  Recent Labs    10/09/23 1603 03/06/24 1550 06/02/24 1504  NA 138 137 136  K 4.9 3.7 3.9  CL 104 101 96*  CO2 26 26 29   GLUCOSE 136* 97 205*  BUN 17 14 31*  CREATININE 1.23 1.17 1.18  CALCIUM  9.3 8.9 9.5  GFRNONAA >60 >60 >60  PROT  --  7.4  --   ALBUMIN  --  3.8  --   AST  --  18  --   ALT  --  14  --   ALKPHOS  --  70  --   BILITOT  --  1.0  --      RADIOGRAPHIC STUDIES:  I  have personally reviewed the radiological images as  listed and agree with the findings in the report.  NM PET Image Initial (PI) Skull Base To Thigh (F-18 FDG) Result Date: 07/10/2024 EXAM: PET AND CT SKULL BASE TO MID THIGH 07/10/2024 02:26:45 PM TECHNIQUE: RADIOPHARMACEUTICAL: 8.59 mCi F-18 FDG Uptake time 53 minutes. Glucose level 132 mg/dl. PET imaging was acquired from the base of the skull to the mid thighs. Non-contrast enhanced computed tomography was obtained for attenuation correction and anatomic localization. COMPARISON: Neck CT 06/09/2023. CLINICAL HISTORY: Laryngeal cancer initial staging, Biopsy Jan 2026, scanned 53 minutes post injection, injection site RAC. FINDINGS: HEAD AND NECK: Intense metabolic activity of the supraglottic mass with SUV max equal 11.0. The mass narrows the lumen of the hypopharynx from right to left. The mass extends into the posterior left and right base of tongue with equal intensity (SUV max equal 13.3 on image 27). Bilateral hypermetabolic level 2 lymph nodes anterior to the sternocleidomastoid muscles. CHEST: Hypermetabolic nodules within the left lower lobe. For example, an ill-defined nodularity of the posterior left lower lobe measuring 17 mm in total with SUV max equal to 6.7 on image 187. Subtle ground glass nodules in the posterior left upper lobe on image 58 with low but measurable metabolic activity on image 187. Similar ill-defined nodules in the superior segment of the left lower lobe with SUV max equal to 3.5 on image 52. No nodules are present in the right lung. There is some mild ground glass density in the right upper lobe with very low metabolic activity. There are no hypermetabolic mediastinal nodes. ABDOMEN AND PELVIS: No metabolically active lymphadenopathy. Physiologic activity within the gastrointestinal and genitourinary systems. BONES AND SOFT TISSUE: No metabolically active aggressive osseous lesion. IMPRESSION: 1. Intense FDG-avid supraglottic  mass narrowing the hypopharyngeal lumen from right to left and extending into the posterior left and right base of tongue. 2. Bilateral FDG-avid level II cervical lymph nodes anterior to the sternocleidomastoid muscles, concerning for nodal metastases. 3. FDG-avid left lung nodules, including ill-defined posterior left lower lobe nodularity and subtle posterior left upper lobe ground-glass nodules, concerning for pulmonary metastases, 4. No FDG-avid mediastinal lymphadenopathy. Electronically signed by: Norleen Boxer MD 07/10/2024 04:05 PM EST RP Workstation: [id number]    CODE STATUS:  Code Status History     Date Active Date Inactive Code Status Order ID Comments User Context   10/21/2020 0257 11/05/2020 0145 Full Code [id number]  Manfred Driver, DO ED       Future Appointments  Date Time Provider Department Center  07/11/2024  2:00 PM [facility] LAB [facility] None  07/11/2024  2:30 PM [facility] NURSE [facility] None  07/11/2024  3:00 PM Izell Domino, MD [facility] None  08/25/2024  2:20 PM Rolan Ezra RAMAN, MD [facility] None     I spent a total of 70 minutes during this encounter with the patient including review of chart and various tests results, discussions about plan of care and coordination of care plan.  This document was completed utilizing speech recognition software. Grammatical errors, random word insertions, pronoun errors, and incomplete sentences are an occasional consequence of this system due to software limitations, ambient noise, and hardware issues. Any formal questions or concerns about the content, text or information contained within the body of this dictation should be directly addressed to the provider for clarification.  "

## 2024-07-11 NOTE — Progress Notes (Signed)
 Oncology Nurse Navigator Documentation   Met with patient during initial consult with Dr. Autumn. He was accompanied by his wife and daughter.  Further introduced myself as his/their Navigator, explained my role as a member of the Care Team. Provided and discussed educational handouts for PEG and PAC. Assisted with post-consult appt scheduling. They verbalized understanding of information provided. I encouraged them to call with questions/concerns moving forward.  Delon Jefferson, RN, BSN, OCN Head & Neck Oncology Nurse Navigator [facility] [phone]

## 2024-07-11 NOTE — Progress Notes (Addendum)
 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 Pain has affected his eating and drinking. He has lost weight because of it.  Biopsies revealed:    Nutrition Status Yes No Comments  Weight changes? [x]  []  20 pounds lost over a few months.  Swallowing concerns? [x]  []  Trouble swallowing food and drinking water  PEG? []  [x]     Referrals Yes No Comments  Social Work? [x]  []    Dentistry? [x]  []    Swallowing therapy? [x]  []    Nutrition? [x]  []    Med/Onc? [x]  []     Safety Issues Yes No Comments  Prior radiation? []  [x]    Pacemaker/ICD? []  [x]    Possible current pregnancy? []  [x]    Is the patient on methotrexate? []  [x]     Tobacco/Marijuana/Snuff/ETOH use: Former smoker  Past/Anticipated interventions by otolaryngology, if any:  Jesus, MD 06/18/2024 Laryngoscopy   Past/Anticipated interventions by medical oncology, if any:      Current Complaints / other details:  None

## 2024-07-11 NOTE — Progress Notes (Signed)
Oncology Nurse Navigator Documentation   Met with patient during initial consult with Dr. Isidore Moos. He was accompanied by his wife.  Further introduced myself as his/their Navigator, explained my role as a member of the Care Team. Provided New Patient resource guide binder: Contact information for physicians, this navigator, other members of the Care Team Advance Directive information; provided [facility] AD booklet at their request,  Fall Prevention Patient [place] Information sheet Symptom Management Clinic information [place] [facility] map with highlight of [place] [facility] Information sheet Head and Neck cancer basics Nutrition information Patient and family support information including Spiritual care/Chaplain information, Peer mentor program, health and wellness classes, and the survivorship program Community resources  Provided and discussed educational handouts for PEG and PAC. Assisted with post-consult appt scheduling. They verbalized understanding of information provided. I encouraged them to call with questions/concerns moving forward.  Harlow Asa, RN, BSN, OCN Head & Neck Oncology Nurse [place] [facility] [place] [phone]

## 2024-07-11 NOTE — Progress Notes (Unsigned)
 "  [facility]  ONCOLOGY CONSULT NOTE   PATIENT NAME: Kyle Peck   MR#: [id number] DOB: 1960-08-19  DATE OF SERVICE: 07/11/2024   REFERRING PROVIDER  ***  Patient Care Team: Pcp, No as PCP - General Branch, Dorn FALCON, MD as PCP - Cardiology (Cardiology) Kennyth Chew, MD as PCP - Electrophysiology (Cardiology)    CHIEF COMPLAINT/ PURPOSE OF CONSULTATION:   ***  ASSESSMENT & PLAN:   Kyle Peck is a 64 y.o. male with a past medical history of ***, was referred to our clinic for ***  No problem-specific Assessment & Plan notes found for this encounter.   Assessment and Plan Assessment & Plan       I reviewed lab results and outside records for this visit and discussed relevant results with the patient. Diagnosis, plan of care and treatment options were also discussed in detail with the patient. Opportunity provided to ask questions and answers provided to his apparent satisfaction. Provided instructions to call our clinic with any problems, questions or concerns prior to return visit. I recommended to continue follow-up with PCP and sub-specialists. He verbalized understanding and agreed with the plan. No barriers to learning was detected.  NCCN guidelines have been consulted in the planning of this patients care.  [name], MD  07/11/2024 11:57 AM  [facility] - A DEPT OF [facility] [address] [place] [address] Dept: [phone] Dept Fax: [phone]   HISTORY OF PRESENTING ILLNESS:   I have reviewed his chart and materials related to his cancer extensively and collaborated history with the patient. Summary of oncologic history is as follows:  ONCOLOGY HISTORY:  ***  Oncology History   No problem history exists.    INTERVAL HISTORY:  Discussed the use of AI scribe software for clinical note transcription with the patient, who gave verbal consent to  proceed.  History of Present Illness     ***  MEDICAL HISTORY:  Past Medical History:  Diagnosis Date   Acid reflux    Alcohol use    Arthritis    CHF (congestive heart failure) (HCC)    Coronary artery disease    Hypertension    Marijuana use    Tobacco abuse     SURGICAL HISTORY: Past Surgical History:  Procedure Laterality Date   DIRECT LARYNGOSCOPY N/A 06/18/2024   Procedure: LARYNGOSCOPY, DIRECT;  Surgeon: Jesus Oliphant, MD;  Location: [facility] OR;  Service: ENT;  Laterality: N/A;   FRACTURE SURGERY     cheek    RIGHT/LEFT HEART CATH AND CORONARY ANGIOGRAPHY N/A 11/04/2020   Procedure: RIGHT/LEFT HEART CATH AND CORONARY ANGIOGRAPHY;  Surgeon: Rolan Ezra RAMAN, MD;  Location: [facility];  Service: Cardiovascular;  Laterality: N/A;    SOCIAL HISTORY: He reports that he has quit smoking. His smoking use included cigars and cigarettes. He has never used smokeless tobacco. He reports that he does not currently use alcohol after a past usage of about 15.0 standard drinks of alcohol per week. He reports that he does not currently use drugs after having used the following drugs: Marijuana. Frequency: 2.00 times per week. Social History   Socioeconomic History   Marital status: Married    Spouse name: Elveria   Number of children: Not on file   Years of education: Not on file   Highest education level: Not on file  Occupational History   Not on file  Tobacco Use  Smoking status: Former    Current packs/day: 0.50    Types: Cigars, Cigarettes   Smokeless tobacco: Never  Vaping Use   Vaping status: Never Used  Substance and Sexual Activity   Alcohol use: Not Currently    Alcohol/week: 15.0 standard drinks of alcohol    Types: 15 Shots of liquor per week    Comment: intermittently   Drug use: Not Currently    Frequency: 2.0 times per week    Types: Marijuana    Comment: also around cocaine before   Sexual activity: Not on file  Other Topics Concern   Not on  file  Social History Narrative   Not on file   Social Drivers of Health   Tobacco Use: Medium Risk (06/18/2024)   Patient History    Smoking Tobacco Use: Former    Smokeless Tobacco Use: Never    Passive Exposure: Not on file  Financial Resource Strain: High Risk (03/13/2022)   Overall Financial Resource Strain (CARDIA)    Difficulty of Paying Living Expenses: Very hard  Food Insecurity: Food Insecurity Present (03/13/2022)   Hunger Vital Sign    Worried About Running Out of Food in the Last Year: Sometimes true    Ran Out of Food in the Last Year: Sometimes true  Transportation Needs: No Transportation Needs (03/12/2024)   Epic    Lack of Transportation (Medical): No    Lack of Transportation (Non-Medical): No  Physical Activity: Not on file  Stress: Not on file  Social Connections: Not on file  Intimate Partner Violence: Not on file  Depression (EYV7-0): Not on file  Alcohol Screen: Not on file  Housing: Low Risk (03/13/2022)   Housing    Last Housing Risk Score: 0  Utilities: Not on file  Health Literacy: Not on file    FAMILY HISTORY: Family History  Problem Relation Age of Onset   Congestive Heart Failure Mother    Congestive Heart Failure Father     ALLERGIES:  He is allergic to spironolactone .  MEDICATIONS:  Current Outpatient Medications  Medication Sig Dispense Refill   aspirin  81 MG chewable tablet Chew 1 tablet (81 mg total) by mouth daily. 30 tablet 11   atorvastatin  (LIPITOR) 40 MG tablet Take 1 tablet (40 mg total) by mouth daily. 90 tablet 3   cholecalciferol (VITAMIN D3) 25 MCG (1000 UNIT) tablet Take 1,000 Units by mouth daily.     cyanocobalamin (VITAMIN B12) 1000 MCG tablet Take 1,000 mcg by mouth daily.     digoxin  (LANOXIN ) 0.125 MG tablet Take 0.5 tablets (0.0625 mg total) by mouth daily. 45 tablet 3   empagliflozin  (JARDIANCE ) 10 MG TABS tablet Take 1 tablet (10 mg total) by mouth daily before breakfast. 90 tablet 3   eplerenone  (INSPRA ) 50 MG  tablet Take 1 tablet (50 mg total) by mouth daily. 90 tablet 3   furosemide  (LASIX ) 20 MG tablet Take 1 tablet (20 mg total) by mouth daily as needed. 90 tablet 3   HYDROcodone -acetaminophen  (NORCO/VICODIN) 5-325 MG tablet Take 2 tablets by mouth every 6 (six) hours as needed for severe pain (7-10). 50 tablet 0   metoprolol  succinate (TOPROL  XL) 25 MG 24 hr tablet Take 0.5 tablets (12.5 mg total) by mouth at bedtime.     Multiple Vitamins-Minerals (MULTIVITAMIN WITH MINERALS) tablet Take 1 tablet by mouth daily.     nystatin -diphenhydrAMINE -dexamethasone  Take 5ml by mouth 4 times a day. 240 mL 5   omeprazole  (PRILOSEC) 20 MG capsule Take 1 capsule (20  mg total) by mouth daily. 90 capsule 0   sacubitril -valsartan  (ENTRESTO ) 97-103 MG Take 1 tablet by mouth 2 (two) times daily. 180 tablet 3   traMADol  (ULTRAM ) 50 MG tablet Take 2 tablets (100 mg total) by mouth every 6 (six) hours as needed. 60 tablet 1   traZODone  (DESYREL ) 100 MG tablet Take 1 tablet (100 mg total) by mouth at bedtime as needed for sleep. (Patient taking differently: Take 100 mg by mouth at bedtime.) 30 tablet 3   No current facility-administered medications for this visit.    REVIEW OF SYSTEMS:    Review of Systems - Oncology  All other pertinent systems were reviewed with the patient and are negative.  PHYSICAL EXAMINATION:  ***   There were no vitals filed for this visit. There were no vitals filed for this visit.  Physical Exam  ***  LABORATORY DATA:   I have reviewed the data as listed.  No results found for any visits on 07/11/24.  Recent Labs    10/09/23 1603 03/06/24 1550 06/02/24 1504  NA 138 137 136  K 4.9 3.7 3.9  CL 104 101 96*  CO2 26 26 29   GLUCOSE 136* 97 205*  BUN 17 14 31*  CREATININE 1.23 1.17 1.18  CALCIUM  9.3 8.9 9.5  GFRNONAA >60 >60 >60  PROT  --  7.4  --   ALBUMIN  --  3.8  --   AST  --  18  --   ALT  --  14  --   ALKPHOS  --  70  --   BILITOT  --  1.0  --       RADIOGRAPHIC STUDIES:  I have personally reviewed the radiological images as listed and agree with the findings in the report.  NM PET Image Initial (PI) Skull Base To Thigh (F-18 FDG) Result Date: 07/10/2024 EXAM: PET AND CT SKULL BASE TO MID THIGH 07/10/2024 02:26:45 PM TECHNIQUE: RADIOPHARMACEUTICAL: 8.59 mCi F-18 FDG Uptake time 53 minutes. Glucose level 132 mg/dl. PET imaging was acquired from the base of the skull to the mid thighs. Non-contrast enhanced computed tomography was obtained for attenuation correction and anatomic localization. COMPARISON: Neck CT 06/09/2023. CLINICAL HISTORY: Laryngeal cancer initial staging, Biopsy Jan 2026, scanned 53 minutes post injection, injection site RAC. FINDINGS: HEAD AND NECK: Intense metabolic activity of the supraglottic mass with SUV max equal 11.0. The mass narrows the lumen of the hypopharynx from right to left. The mass extends into the posterior left and right base of tongue with equal intensity (SUV max equal 13.3 on image 27). Bilateral hypermetabolic level 2 lymph nodes anterior to the sternocleidomastoid muscles. CHEST: Hypermetabolic nodules within the left lower lobe. For example, an ill-defined nodularity of the posterior left lower lobe measuring 17 mm in total with SUV max equal to 6.7 on image 187. Subtle ground glass nodules in the posterior left upper lobe on image 58 with low but measurable metabolic activity on image 187. Similar ill-defined nodules in the superior segment of the left lower lobe with SUV max equal to 3.5 on image 52. No nodules are present in the right lung. There is some mild ground glass density in the right upper lobe with very low metabolic activity. There are no hypermetabolic mediastinal nodes. ABDOMEN AND PELVIS: No metabolically active lymphadenopathy. Physiologic activity within the gastrointestinal and genitourinary systems. BONES AND SOFT TISSUE: No metabolically active aggressive osseous lesion. IMPRESSION:  1. Intense FDG-avid supraglottic mass narrowing the hypopharyngeal lumen from right to  left and extending into the posterior left and right base of tongue. 2. Bilateral FDG-avid level II cervical lymph nodes anterior to the sternocleidomastoid muscles, concerning for nodal metastases. 3. FDG-avid left lung nodules, including ill-defined posterior left lower lobe nodularity and subtle posterior left upper lobe ground-glass nodules, concerning for pulmonary metastases, 4. No FDG-avid mediastinal lymphadenopathy. Electronically signed by: Norleen Boxer MD 07/10/2024 04:05 PM EST RP Workstation: [id number]    No orders of the defined types were placed in this encounter.   CODE STATUS:  Code Status History     Date Active Date Inactive Code Status Order ID Comments User Context   10/21/2020 0257 11/05/2020 0145 Full Code [id number]  Manfred Driver, DO ED       Future Appointments  Date Time Provider Department Center  07/11/2024  1:00 PM Makhiya Coburn, MD [facility] None  07/11/2024  2:00 PM [facility] LAB [facility] None  07/11/2024  2:30 PM [facility] NURSE [facility] None  07/11/2024  3:00 PM Izell Domino, MD [facility] None  08/25/2024  2:20 PM Rolan Ezra RAMAN, MD [facility] None     I spent a total of 70 minutes during this encounter with the patient including review of chart and various tests results, discussions about plan of care and coordination of care plan.  This document was completed utilizing speech recognition software. Grammatical errors, random word insertions, pronoun errors, and incomplete sentences are an occasional consequence of this system due to software limitations, ambient noise, and hardware issues. Any formal questions or concerns about the content, text or information contained within the body of this dictation should be directly addressed to the provider for clarification.  "

## 2024-07-14 ENCOUNTER — Inpatient Hospital Stay

## 2024-07-14 ENCOUNTER — Inpatient Hospital Stay: Admitting: Oncology

## 2024-07-15 ENCOUNTER — Ambulatory Visit: Admission: RE | Admit: 2024-07-15 | Admitting: Radiation Oncology

## 2024-07-15 ENCOUNTER — Ambulatory Visit

## 2024-08-25 ENCOUNTER — Ambulatory Visit (HOSPITAL_COMMUNITY): Admitting: Cardiology
